# Patient Record
Sex: Female | Born: 1955 | Race: White | Hispanic: No | Smoking: Former smoker
Health system: Southern US, Community
[De-identification: ages and names within clinical notes are randomized; demographics above are authoritative.]

## PROBLEM LIST (undated history)

## (undated) DIAGNOSIS — J449 Chronic obstructive pulmonary disease, unspecified: Secondary | ICD-10-CM

## (undated) DIAGNOSIS — F419 Anxiety disorder, unspecified: Secondary | ICD-10-CM

## (undated) DIAGNOSIS — J189 Pneumonia, unspecified organism: Secondary | ICD-10-CM

## (undated) DIAGNOSIS — R600 Localized edema: Secondary | ICD-10-CM

## (undated) DIAGNOSIS — Z9889 Other specified postprocedural states: Secondary | ICD-10-CM

## (undated) DIAGNOSIS — M069 Rheumatoid arthritis, unspecified: Secondary | ICD-10-CM

## (undated) DIAGNOSIS — K219 Gastro-esophageal reflux disease without esophagitis: Secondary | ICD-10-CM

## (undated) DIAGNOSIS — I447 Left bundle-branch block, unspecified: Secondary | ICD-10-CM

## (undated) DIAGNOSIS — I509 Heart failure, unspecified: Secondary | ICD-10-CM

## (undated) DIAGNOSIS — B029 Zoster without complications: Secondary | ICD-10-CM

## (undated) DIAGNOSIS — I10 Essential (primary) hypertension: Secondary | ICD-10-CM

## (undated) DIAGNOSIS — K317 Polyp of stomach and duodenum: Secondary | ICD-10-CM

## (undated) DIAGNOSIS — E785 Hyperlipidemia, unspecified: Secondary | ICD-10-CM

## (undated) DIAGNOSIS — K589 Irritable bowel syndrome without diarrhea: Secondary | ICD-10-CM

## (undated) DIAGNOSIS — R112 Nausea with vomiting, unspecified: Secondary | ICD-10-CM

## (undated) HISTORY — DX: Left bundle-branch block, unspecified: I44.7

## (undated) HISTORY — PX: VAGINAL HYSTERECTOMY: SUR661

## (undated) HISTORY — DX: Essential (primary) hypertension: I10

## (undated) HISTORY — PX: DILATION AND CURETTAGE OF UTERUS: SHX78

## (undated) HISTORY — PX: KNEE ARTHROSCOPY: SUR90

## (undated) HISTORY — PX: TUBAL LIGATION: SHX77

## (undated) HISTORY — DX: Polyp of stomach and duodenum: K31.7

## (undated) HISTORY — DX: Zoster without complications: B02.9

## (undated) HISTORY — DX: Chronic obstructive pulmonary disease, unspecified: J44.9

## (undated) HISTORY — DX: Heart failure, unspecified: I50.9

## (undated) HISTORY — DX: Gastro-esophageal reflux disease without esophagitis: K21.9

## (undated) HISTORY — PX: CARDIAC CATHETERIZATION: SHX172

## (undated) HISTORY — PX: ESOPHAGOGASTRODUODENOSCOPY: SHX1529

## (undated) HISTORY — PX: CHOLECYSTECTOMY: SHX55

## (undated) HISTORY — DX: Anxiety disorder, unspecified: F41.9

## (undated) HISTORY — DX: Hyperlipidemia, unspecified: E78.5

---

## 2003-08-25 ENCOUNTER — Encounter: Admission: RE | Admit: 2003-08-25 | Discharge: 2003-08-25 | Payer: Self-pay | Admitting: Family Medicine

## 2003-09-29 ENCOUNTER — Encounter: Admission: RE | Admit: 2003-09-29 | Discharge: 2003-09-29 | Payer: Self-pay | Admitting: Family Medicine

## 2004-01-10 ENCOUNTER — Other Ambulatory Visit: Admission: RE | Admit: 2004-01-10 | Discharge: 2004-01-10 | Payer: Self-pay | Admitting: Family Medicine

## 2004-01-19 ENCOUNTER — Encounter: Admission: RE | Admit: 2004-01-19 | Discharge: 2004-01-19 | Payer: Self-pay | Admitting: Family Medicine

## 2004-05-01 ENCOUNTER — Ambulatory Visit: Payer: Self-pay | Admitting: Family Medicine

## 2004-07-10 ENCOUNTER — Ambulatory Visit: Payer: Self-pay | Admitting: Family Medicine

## 2004-07-25 ENCOUNTER — Ambulatory Visit: Payer: Self-pay | Admitting: Family Medicine

## 2004-10-25 ENCOUNTER — Ambulatory Visit: Payer: Self-pay | Admitting: Family Medicine

## 2004-10-27 ENCOUNTER — Ambulatory Visit: Payer: Self-pay | Admitting: Family Medicine

## 2004-11-06 ENCOUNTER — Ambulatory Visit: Payer: Self-pay | Admitting: Family Medicine

## 2004-11-09 ENCOUNTER — Ambulatory Visit: Payer: Self-pay | Admitting: Family Medicine

## 2005-04-19 ENCOUNTER — Ambulatory Visit: Payer: Self-pay | Admitting: Family Medicine

## 2005-05-09 ENCOUNTER — Ambulatory Visit: Payer: Self-pay | Admitting: Family Medicine

## 2005-05-10 ENCOUNTER — Ambulatory Visit: Payer: Self-pay | Admitting: Family Medicine

## 2005-05-18 ENCOUNTER — Ambulatory Visit: Payer: Self-pay | Admitting: Internal Medicine

## 2005-06-05 ENCOUNTER — Ambulatory Visit: Payer: Self-pay | Admitting: Gastroenterology

## 2005-06-06 ENCOUNTER — Ambulatory Visit: Payer: Self-pay | Admitting: Gastroenterology

## 2005-06-06 HISTORY — PX: COLONOSCOPY: SHX174

## 2005-07-17 ENCOUNTER — Ambulatory Visit: Payer: Self-pay | Admitting: Gastroenterology

## 2005-07-31 ENCOUNTER — Ambulatory Visit: Payer: Self-pay | Admitting: Family Medicine

## 2005-08-20 ENCOUNTER — Ambulatory Visit (HOSPITAL_BASED_OUTPATIENT_CLINIC_OR_DEPARTMENT_OTHER): Admission: RE | Admit: 2005-08-20 | Discharge: 2005-08-20 | Payer: Self-pay | Admitting: Family Medicine

## 2005-08-25 ENCOUNTER — Ambulatory Visit: Payer: Self-pay | Admitting: Pulmonary Disease

## 2005-08-31 ENCOUNTER — Ambulatory Visit: Payer: Self-pay | Admitting: Critical Care Medicine

## 2005-10-09 ENCOUNTER — Ambulatory Visit: Payer: Self-pay | Admitting: Pulmonary Disease

## 2005-10-11 ENCOUNTER — Ambulatory Visit: Payer: Self-pay | Admitting: Family Medicine

## 2005-10-26 ENCOUNTER — Ambulatory Visit: Payer: Self-pay

## 2005-11-20 ENCOUNTER — Ambulatory Visit: Payer: Self-pay

## 2005-11-20 ENCOUNTER — Encounter: Payer: Self-pay | Admitting: Cardiology

## 2005-11-23 ENCOUNTER — Ambulatory Visit: Payer: Self-pay | Admitting: Cardiology

## 2006-01-03 ENCOUNTER — Ambulatory Visit: Payer: Self-pay | Admitting: Cardiology

## 2006-02-22 ENCOUNTER — Ambulatory Visit: Payer: Self-pay | Admitting: Family Medicine

## 2006-03-11 ENCOUNTER — Ambulatory Visit: Payer: Self-pay | Admitting: Family Medicine

## 2006-05-08 ENCOUNTER — Ambulatory Visit: Payer: Self-pay | Admitting: Family Medicine

## 2006-08-26 ENCOUNTER — Ambulatory Visit: Payer: Self-pay | Admitting: Family Medicine

## 2006-08-27 ENCOUNTER — Ambulatory Visit: Payer: Self-pay | Admitting: Family Medicine

## 2006-10-08 DIAGNOSIS — K219 Gastro-esophageal reflux disease without esophagitis: Secondary | ICD-10-CM | POA: Insufficient documentation

## 2006-10-08 DIAGNOSIS — E785 Hyperlipidemia, unspecified: Secondary | ICD-10-CM | POA: Insufficient documentation

## 2006-10-08 DIAGNOSIS — J449 Chronic obstructive pulmonary disease, unspecified: Secondary | ICD-10-CM | POA: Insufficient documentation

## 2006-10-08 DIAGNOSIS — E1151 Type 2 diabetes mellitus with diabetic peripheral angiopathy without gangrene: Secondary | ICD-10-CM | POA: Insufficient documentation

## 2006-10-08 DIAGNOSIS — Z9889 Other specified postprocedural states: Secondary | ICD-10-CM | POA: Insufficient documentation

## 2006-10-08 DIAGNOSIS — I10 Essential (primary) hypertension: Secondary | ICD-10-CM | POA: Insufficient documentation

## 2006-10-08 DIAGNOSIS — E119 Type 2 diabetes mellitus without complications: Secondary | ICD-10-CM | POA: Insufficient documentation

## 2006-10-08 DIAGNOSIS — E1165 Type 2 diabetes mellitus with hyperglycemia: Secondary | ICD-10-CM

## 2006-12-02 ENCOUNTER — Encounter: Payer: Self-pay | Admitting: Family Medicine

## 2006-12-19 ENCOUNTER — Ambulatory Visit: Payer: Self-pay | Admitting: Family Medicine

## 2006-12-19 DIAGNOSIS — J019 Acute sinusitis, unspecified: Secondary | ICD-10-CM | POA: Insufficient documentation

## 2006-12-26 ENCOUNTER — Encounter: Payer: Self-pay | Admitting: Family Medicine

## 2007-01-14 ENCOUNTER — Ambulatory Visit: Payer: Self-pay | Admitting: Family Medicine

## 2007-01-15 ENCOUNTER — Encounter: Payer: Self-pay | Admitting: Family Medicine

## 2007-06-10 ENCOUNTER — Telehealth (INDEPENDENT_AMBULATORY_CARE_PROVIDER_SITE_OTHER): Payer: Self-pay | Admitting: *Deleted

## 2007-12-04 ENCOUNTER — Ambulatory Visit: Payer: Self-pay | Admitting: Family Medicine

## 2007-12-04 ENCOUNTER — Telehealth: Payer: Self-pay | Admitting: Family Medicine

## 2007-12-04 DIAGNOSIS — F419 Anxiety disorder, unspecified: Secondary | ICD-10-CM | POA: Insufficient documentation

## 2007-12-31 ENCOUNTER — Ambulatory Visit: Payer: Self-pay | Admitting: Family Medicine

## 2007-12-31 LAB — CONVERTED CEMR LAB
Nitrite: NEGATIVE
Protein, U semiquant: NEGATIVE

## 2008-01-01 ENCOUNTER — Telehealth (INDEPENDENT_AMBULATORY_CARE_PROVIDER_SITE_OTHER): Payer: Self-pay | Admitting: *Deleted

## 2008-01-05 ENCOUNTER — Telehealth (INDEPENDENT_AMBULATORY_CARE_PROVIDER_SITE_OTHER): Payer: Self-pay | Admitting: *Deleted

## 2008-01-07 ENCOUNTER — Telehealth (INDEPENDENT_AMBULATORY_CARE_PROVIDER_SITE_OTHER): Payer: Self-pay | Admitting: *Deleted

## 2008-01-11 LAB — CONVERTED CEMR LAB
Albumin: 3.7 g/dL (ref 3.5–5.2)
Alkaline Phosphatase: 82 units/L (ref 39–117)
Bilirubin, Direct: 0.1 mg/dL (ref 0.0–0.3)
GFR calc Af Amer: 84 mL/min
GFR calc non Af Amer: 69 mL/min
HDL: 33.1 mg/dL — ABNORMAL LOW (ref >39.0)
Hgb A1c MFr Bld: 9.3 % — ABNORMAL HIGH (ref 4.6–6.0)
Microalb Creat Ratio: 20 mg/g (ref 0.0–30.0)
Potassium: 3.9 meq/L (ref 3.5–5.1)
Sodium: 140 meq/L (ref 135–145)
Total Bilirubin: 0.6 mg/dL (ref 0.3–1.2)
Total CHOL/HDL Ratio: 5.5
VLDL: 49 mg/dL — ABNORMAL HIGH (ref 0–40)

## 2008-01-12 ENCOUNTER — Telehealth (INDEPENDENT_AMBULATORY_CARE_PROVIDER_SITE_OTHER): Payer: Self-pay | Admitting: *Deleted

## 2008-03-12 ENCOUNTER — Encounter: Payer: Self-pay | Admitting: Family Medicine

## 2008-03-31 ENCOUNTER — Encounter: Payer: Self-pay | Admitting: Family Medicine

## 2008-04-12 ENCOUNTER — Encounter: Payer: Self-pay | Admitting: Family Medicine

## 2008-05-07 ENCOUNTER — Telehealth (INDEPENDENT_AMBULATORY_CARE_PROVIDER_SITE_OTHER): Payer: Self-pay | Admitting: *Deleted

## 2008-05-07 ENCOUNTER — Ambulatory Visit: Payer: Self-pay | Admitting: Family Medicine

## 2008-05-07 DIAGNOSIS — J40 Bronchitis, not specified as acute or chronic: Secondary | ICD-10-CM | POA: Insufficient documentation

## 2008-05-07 DIAGNOSIS — J209 Acute bronchitis, unspecified: Secondary | ICD-10-CM | POA: Insufficient documentation

## 2008-05-13 ENCOUNTER — Encounter: Payer: Self-pay | Admitting: Family Medicine

## 2008-05-19 ENCOUNTER — Ambulatory Visit: Payer: Self-pay | Admitting: Family Medicine

## 2008-05-24 ENCOUNTER — Telehealth: Payer: Self-pay | Admitting: Family Medicine

## 2008-09-14 ENCOUNTER — Encounter: Payer: Self-pay | Admitting: Family Medicine

## 2008-11-16 ENCOUNTER — Encounter: Payer: Self-pay | Admitting: Family Medicine

## 2008-12-01 ENCOUNTER — Telehealth (INDEPENDENT_AMBULATORY_CARE_PROVIDER_SITE_OTHER): Payer: Self-pay | Admitting: *Deleted

## 2008-12-17 ENCOUNTER — Ambulatory Visit: Payer: Self-pay | Admitting: Family Medicine

## 2008-12-17 DIAGNOSIS — R209 Unspecified disturbances of skin sensation: Secondary | ICD-10-CM | POA: Insufficient documentation

## 2008-12-17 DIAGNOSIS — L0201 Cutaneous abscess of face: Secondary | ICD-10-CM | POA: Insufficient documentation

## 2008-12-17 DIAGNOSIS — L03211 Cellulitis of face: Secondary | ICD-10-CM

## 2009-01-22 ENCOUNTER — Encounter: Payer: Self-pay | Admitting: Family Medicine

## 2009-01-24 ENCOUNTER — Telehealth: Payer: Self-pay | Admitting: Family Medicine

## 2009-03-09 ENCOUNTER — Telehealth (INDEPENDENT_AMBULATORY_CARE_PROVIDER_SITE_OTHER): Payer: Self-pay | Admitting: *Deleted

## 2009-03-16 ENCOUNTER — Encounter: Payer: Self-pay | Admitting: Family Medicine

## 2009-03-17 ENCOUNTER — Ambulatory Visit: Payer: Self-pay | Admitting: Family Medicine

## 2009-03-17 DIAGNOSIS — M1990 Unspecified osteoarthritis, unspecified site: Secondary | ICD-10-CM | POA: Insufficient documentation

## 2009-03-17 LAB — CONVERTED CEMR LAB
Bilirubin Urine: NEGATIVE
Glucose, Urine, Semiquant: NEGATIVE
Urobilinogen, UA: NEGATIVE

## 2009-03-21 ENCOUNTER — Telehealth: Payer: Self-pay | Admitting: Family Medicine

## 2009-03-21 LAB — CONVERTED CEMR LAB
Albumin: 4 g/dL (ref 3.5–5.2)
Basophils Relative: 0.9 % (ref 0.0–3.0)
CO2: 31 meq/L (ref 19–32)
Chloride: 104 meq/L (ref 96–112)
Cholesterol: 189 mg/dL (ref 0–200)
Creatinine, Ser: 0.9 mg/dL (ref 0.4–1.2)
Eosinophils Absolute: 0.1 10*3/uL (ref 0.0–0.7)
HCT: 38.4 % (ref 36.0–46.0)
Hemoglobin: 13.2 g/dL (ref 12.0–15.0)
Hgb A1c MFr Bld: 7.1 % — ABNORMAL HIGH (ref 4.6–6.5)
Lymphs Abs: 2.1 10*3/uL (ref 0.7–4.0)
MCHC: 34.4 g/dL (ref 30.0–36.0)
MCV: 86.4 fL (ref 78.0–100.0)
Microalb, Ur: 0.8 mg/dL (ref 0.0–1.9)
Monocytes Absolute: 0.5 10*3/uL (ref 0.1–1.0)
Neutro Abs: 5.5 10*3/uL (ref 1.4–7.7)
Neutrophils Relative %: 66 % (ref 43.0–77.0)
RBC: 4.45 M/uL (ref 3.87–5.11)
TSH: 1.66 microintl units/mL (ref 0.35–5.50)
Total CHOL/HDL Ratio: 6
Total Protein: 7.4 g/dL (ref 6.0–8.3)

## 2009-05-22 ENCOUNTER — Telehealth: Payer: Self-pay | Admitting: Family Medicine

## 2009-05-25 ENCOUNTER — Telehealth (INDEPENDENT_AMBULATORY_CARE_PROVIDER_SITE_OTHER): Payer: Self-pay | Admitting: *Deleted

## 2009-08-22 ENCOUNTER — Ambulatory Visit: Payer: Self-pay | Admitting: Family Medicine

## 2009-08-22 DIAGNOSIS — R109 Unspecified abdominal pain: Secondary | ICD-10-CM | POA: Insufficient documentation

## 2009-08-22 LAB — CONVERTED CEMR LAB
Blood in Urine, dipstick: NEGATIVE
Nitrite: NEGATIVE
WBC Urine, dipstick: NEGATIVE
pH: 5

## 2009-08-23 ENCOUNTER — Telehealth (INDEPENDENT_AMBULATORY_CARE_PROVIDER_SITE_OTHER): Payer: Self-pay | Admitting: *Deleted

## 2009-08-23 LAB — CONVERTED CEMR LAB
AST: 29 units/L (ref 0–37)
Albumin: 4.2 g/dL (ref 3.5–5.2)
Alkaline Phosphatase: 79 units/L (ref 39–117)
Basophils Absolute: 0.1 10*3/uL (ref 0.0–0.1)
Bilirubin, Direct: 0 mg/dL (ref 0.0–0.3)
Calcium: 9.2 mg/dL (ref 8.4–10.5)
Creatinine, Ser: 0.9 mg/dL (ref 0.4–1.2)
Creatinine,U: 147.5 mg/dL
Eosinophils Absolute: 0.1 10*3/uL (ref 0.0–0.7)
GFR calc non Af Amer: 68.82 mL/min (ref >60)
Glucose, Bld: 83 mg/dL (ref 70–99)
HDL: 45.7 mg/dL (ref >39.00)
Hemoglobin: 13.1 g/dL (ref 12.0–15.0)
Hgb A1c MFr Bld: 6.1 % (ref 4.6–6.5)
Lipase: 20 units/L (ref 11.0–59.0)
Lymphocytes Relative: 24.7 % (ref 12.0–46.0)
Microalb Creat Ratio: 8.1 mg/g (ref 0.0–30.0)
Microalb, Ur: 1.2 mg/dL (ref 0.0–1.9)
Monocytes Relative: 6.2 % (ref 3.0–12.0)
Neutro Abs: 5.8 10*3/uL (ref 1.4–7.7)
Neutrophils Relative %: 66.9 % (ref 43.0–77.0)
RDW: 11.8 % (ref 11.5–14.6)
Sodium: 139 meq/L (ref 135–145)
Total Bilirubin: 0.4 mg/dL (ref 0.3–1.2)
Total CHOL/HDL Ratio: 4
Triglycerides: 165 mg/dL — ABNORMAL HIGH (ref 0.0–149.0)
VLDL: 33 mg/dL (ref 0.0–40.0)

## 2009-08-25 ENCOUNTER — Encounter: Payer: Self-pay | Admitting: Family Medicine

## 2009-08-26 ENCOUNTER — Encounter (INDEPENDENT_AMBULATORY_CARE_PROVIDER_SITE_OTHER): Payer: Self-pay | Admitting: *Deleted

## 2009-08-26 ENCOUNTER — Telehealth: Payer: Self-pay | Admitting: Family Medicine

## 2009-09-08 ENCOUNTER — Telehealth: Payer: Self-pay | Admitting: Family Medicine

## 2009-09-23 ENCOUNTER — Telehealth: Payer: Self-pay | Admitting: Family Medicine

## 2009-09-26 ENCOUNTER — Encounter: Payer: Self-pay | Admitting: Family Medicine

## 2009-12-15 ENCOUNTER — Telehealth: Payer: Self-pay | Admitting: Family Medicine

## 2010-01-02 ENCOUNTER — Encounter: Payer: Self-pay | Admitting: Family Medicine

## 2010-02-08 ENCOUNTER — Telehealth (INDEPENDENT_AMBULATORY_CARE_PROVIDER_SITE_OTHER): Payer: Self-pay | Admitting: *Deleted

## 2010-04-10 ENCOUNTER — Encounter: Payer: Self-pay | Admitting: Family Medicine

## 2010-05-22 ENCOUNTER — Encounter: Payer: Self-pay | Admitting: Family Medicine

## 2010-06-09 ENCOUNTER — Encounter: Payer: Self-pay | Admitting: Family Medicine

## 2010-06-20 ENCOUNTER — Encounter: Payer: Self-pay | Admitting: Family Medicine

## 2010-06-30 ENCOUNTER — Ambulatory Visit: Admit: 2010-06-30 | Payer: Self-pay | Admitting: Internal Medicine

## 2010-07-25 NOTE — Progress Notes (Signed)
Summary: REFILL REQUEST  Phone Note Refill Request Call back at [phone] Message from:  Pharmacy on December 15, 2009 8:21 AM  Refills Requested: Medication #1:  ALPRAZOLAM 0.5 MG  TB24 as needed   Dosage confirmed as above?Dosage Confirmed   Supply Requested: 3 months   Last Refilled: 08/26/2009 [facility] [address] [place], [place] [address]  Next Appointment Scheduled: NONE Initial call taken by: Lavell Islam,  December 15, 2009 8:21 AM  Follow-up for Phone Call        last ov- 08/22/09. Army Fossa CMA  December 15, 2009 8:34 AM   Additional Follow-up for Phone Call Additional follow up Details #1::        refill x1 Additional Follow-up by: Loreen Freud DO,  December 15, 2009 10:17 AM    Prescriptions: ALPRAZOLAM 0.5 MG  TB24 (ALPRAZOLAM) as needed  #90 x 0   Entered by:   Army Fossa CMA   Authorized by:   Loreen Freud DO   Signed by:   Army Fossa CMA on 12/15/2009   Method used:   Printed then faxed to ...       [facility]  [address] [phone]* (retail)       [address]       [place], [place]  [address]       Ph: [phone] or [phone]       Fax: [phone]   RxID:   [id number]

## 2010-07-25 NOTE — Medication Information (Signed)
Summary: Fax Regarding Adding Lipid Lowering Agent/[facility]  Fax Regarding Cardiovascular Risk in Diabetes/[facility]   Imported By: Lanelle Bal 04/24/2010 09:12:58  _____________________________________________________________________  External Attachment:    Type:   Image     Comment:   External Document

## 2010-07-25 NOTE — Letter (Signed)
Summary: New Patient letter  [facility]  [address] [place], [place] [address]   Phone: [phone]  Fax: [phone]       08/26/2009 MRN: [id number]  Beth Shepard [address] [place], [place]  [address]  Dear [name],  Welcome to the Gastroenterology Division at [facility].    You are scheduled to see Dr.  Jarold Motto on  09-23-09 at  [facility] on the 3rd floor at [facility], [address]  We ask that you try to arrive at our office 15 minutes prior to your appointment time to allow for check-in.  We would like you to complete the enclosed self-administered evaluation form prior to your visit and bring it with you on the day of your appointment.  We will review it with you.  Also, please bring a complete list of all your medications or, if you prefer, bring the medication bottles and we will list them.  Please bring your insurance card so that we may make a copy of it.  If your insurance requires a referral to see a specialist, please bring your referral form from your primary care physician.  Co-payments are due at the time of your visit and may be paid by cash, check or credit card.     Your office visit will consist of a consult with your physician (includes a physical exam), any laboratory testing he/she may order, scheduling of any necessary diagnostic testing (e.g. x-ray, ultrasound, CT-scan), and scheduling of a procedure (e.g. Endoscopy, Colonoscopy) if required.  Please allow enough time on your schedule to allow for any/all of these possibilities.    If you cannot keep your appointment, please call [phone] to cancel or reschedule prior to your appointment date.  This allows [place] the opportunity to schedule an appointment for another patient in need of care.  If you do not cancel or reschedule by 5 p.m. the business day prior to your appointment date, you will be charged a $50.00 late cancellation/no-show fee.    Thank you for choosing  [facility] for your medical needs.  We appreciate the opportunity to care for you.  Please visit [place] at our website  to learn more about our practice.                     Sincerely,                                                             The Gastroenterology Division

## 2010-07-25 NOTE — Medication Information (Signed)
Summary: Care Consideration Regarding Statin/[employer] Health Mgmt  Care Consideration Regarding Statin/Active Health Mgmt   Imported By: Lanelle Bal 10/12/2009 09:31:16  _____________________________________________________________________  External Attachment:    Type:   Image     Comment:   External Document

## 2010-07-25 NOTE — Progress Notes (Signed)
Summary: REfills  Phone Note Refill Request Call back at Home Phone [phone]   Refills Requested: Medication #1:  ULTRAM 50 MG TABS 1-2 by mouth every 6 hours as needed   Last Refilled: 03/17/2009  Medication #2:  DEXILANT 60 MG CPDR 1 by mouth once daily last ov- 08/22/09 cpx. Army Fossa CMA  September 23, 2009 1:23 PM    Follow-up for Phone Call        ok to refill both x1 11 refills on dexilant Follow-up by: Loreen Freud DO,  September 23, 2009 1:46 PM  Additional Follow-up for Phone Call Additional follow up Details #1::        Pt is aware, meds sent in. Army Fossa CMA  September 23, 2009 1:50 PM     Prescriptions: ULTRAM 50 MG TABS (TRAMADOL HCL) 1-2 by mouth every 6 hours as needed  #180 x 0   Entered by:   Army Fossa CMA   Authorized by:   Loreen Freud DO   Signed by:   Army Fossa CMA on 09/23/2009   Method used:   [facility]        [facility]  [address] [phone]* (retail)       [address]       [place], [place]  [address]       Ph: [phone] or [phone]       Fax: [phone]   RxID:   [id number] DEXILANT 60 MG CPDR (DEXLANSOPRAZOLE) 1 by mouth once daily  #30 x 11   Entered by:   Army Fossa CMA   Authorized by:   Loreen Freud DO   Signed by:   Army Fossa CMA on 09/23/2009   Method used:   [facility]ically [facility]        [facility]  [address] [phone]* (retail)       [address]       [place], [place]  [address]       Ph: [phone] or [phone]       Fax: [phone]   RxID:   [id number]

## 2010-07-25 NOTE — Progress Notes (Signed)
Summary: ? yeast infection  Phone Note Call from Patient   Caller: Patient Summary of Call: Pt states that over the past 2 days she has had a dischage and having some itching. When she wipes after urinating she noticies tenderness. No burning when urinating. She states she has not tried anything OTC to try and clear this up. She states it usually takes Dr.Lowne prescribing something for it clear up. [facility] [place] Please advise. Army Fossa CMA  September 08, 2009 1:56 PM   Follow-up for Phone Call        diflucan 150 #2  1 by mouth once daily x1--may repeat 3 days as needed  Follow-up by: Loreen Freud DO,  September 08, 2009 2:12 PM  Additional Follow-up for Phone Call Additional follow up Details #1::        Pt is aware. Army Fossa CMA  September 08, 2009 2:26 PM     New/Updated Medications: DIFLUCAN 150 MG TABS (FLUCONAZOLE) 1 by mouth once daily x1--may repeat 3 days as needed Prescriptions: DIFLUCAN 150 MG TABS (FLUCONAZOLE) 1 by mouth once daily x1--may repeat 3 days as needed  #2 x 0   Entered by:   Army Fossa CMA   Authorized by:   Loreen Freud DO   Signed by:   Army Fossa CMA on 09/08/2009   Method used:   [facility]        [facility]  [address] [phone]* (retail)       [address]       [place], [place]  [address]       Ph: [phone] or [phone]       Fax: [phone]   RxID:   [id number]

## 2010-07-25 NOTE — Progress Notes (Signed)
Summary: ultrasound?  Phone Note Call from Patient   Caller: Patient Summary of Call: patient says she thought that she was going to have ultrasound done before we referred to her go GI just wanted to be sure although she is ok w/ going to see Dr. Jarold Motto aware Dr. Laury Axon out of office today and will address on mon...Marland KitchenMarland KitchenDoristine Devoid  August 26, 2009 10:23 AM   Follow-up for Phone Call        I thought about it but decided to let GI see her first. Follow-up by: Loreen Freud DO,  August 28, 2009 8:44 AM  Additional Follow-up for Phone Call Additional follow up Details #1::        Pt is aware. Army Fossa CMA  August 29, 2009 10:32 AM

## 2010-07-25 NOTE — Miscellaneous (Signed)
Summary: Orders Update   Clinical Lists Changes  Orders: Added new Referral order of Gastroenterology Referral (GI) - Signed 

## 2010-07-25 NOTE — Letter (Signed)
Summary: Care Consideration Regarding Eye Exam/[facility]  Care Consideration Regarding Eye Exam/[facility]   Imported By: Lanelle Bal 02/07/2010 07:58:16  _____________________________________________________________________  External Attachment:    Type:   Image     Comment:   External Document  Appended Document: Care Consideration Regarding Eye Exam/[facility] let pt know we received notification from ins co that she needed an eye exam  Appended Document: Care Consideration Regarding Eye Exam/[facility] SEE PHONE NOTE

## 2010-07-25 NOTE — Procedures (Signed)
Summary: EGD   EGD  Procedure date:  06/06/2005  Findings:      Location: [facility]    EGD  Procedure date:  06/06/2005  Findings:      Location: [facility]   Patient Name: Beth Shepard, Beth Shepard MRN:  Procedure Procedures: Panendoscopy (EGD) CPT: 43235.    with biopsy(s)/brushing(s). CPT: D1846139.  Personnel: Endoscopist: Vania Rea. Jarold Motto, MD.  Exam Location: Exam performed in Outpatient Clinic. Outpatient  Patient Consent: Procedure, Alternatives, Risks and Benefits discussed, consent obtained, from patient. Consent was obtained by the RN.  Indications Symptoms: Nausea. Vomiting. Abdominal pain, location: diffuse.  History  Current Medications: Patient is not currently taking Coumadin.  Pre-Exam Physical: Performed Jun 06, 2005  Cardio-pulmonary exam, Abdominal exam, Extremity exam, Mental status exam WNL.  Comments: Patient history reviewed and updated, pre-procedure physical exam performed prior to initiation of sedation? Exam Exam Info: Maximum depth of insertion Duodenum, intended Duodenum. Patient position: on left side. Duration of exam: 15 minutes. Vocal cords visualized. Gastric retroflexion performed. Images taken. ASA Classification: II. Tolerance: excellent.  Sedation Meds: Patient assessed and found to be appropriate for moderate (conscious) sedation. Cetacaine Spray 2 sprays given aerosolized.  Monitoring: BP and pulse monitoring done. Oximetry used. Supplemental O2 given at 2 Liters.  Instrument(s): GIF 160. Serial [id number].   Findings - Normal: Proximal Esophagus to Distal Esophagus. Not Seen: Tumor. Barrett's esophagus. Esophageal inflammation. Mucosal abnormality. Stricture. Varices.  - Normal: Pyloric Sphincter to Duodenal 2nd Portion. Ulcer. Mucosal abnormality. AVM's. Foreign body.  - MUCOSAL ABNORMALITY: Body to Antrum. Nodularity present. Red spots present. Granular mucosa. RUT done, results pending.  ICD9: Gastritis, Unspecified: 535.50.   Assessment  Diagnoses: 535.50: Gastritis, Unspecified.   Comments: Clinically with chronic GERD.... Events  Unplanned Intervention: No unplanned interventions were required.  Plans Medication(s): Await pathology. Continue current medications.  Disposition: After procedure patient sent to recovery. After recovery patient sent home.  Scheduling: Follow-up prn.    This report was created from the original endoscopy report, which was reviewed and signed by the above listed endoscopist.

## 2010-07-25 NOTE — Progress Notes (Signed)
Summary: Lab results   Phone Note Outgoing Call   Call placed by: Army Fossa CMA,  August 23, 2009 11:19 AM Summary of Call: Regarding lab results, LMTCB:  Ideally your LDL (bad cholesterol) should be <_70___, your HDL (good cholesterol) should be >_40__ and your triglycerides should be< 150.  Diet and exercise will increase HDL and decrease the LDL and triglycerides. Read Dr. Danice Goltz book--Eat Drink and Be Healthy.   Con't meds.  Add Antara to decrease Triglycerides.  We will recheck labs in _3__ months.   272.4  250.00  hgba1c, bmp,  hep, lipid DM controlled Signed by Loreen Freud DO on 08/23/2009 at 6:47 AM  Follow-up for Phone Call        Pt is aware. Army Fossa CMA  August 24, 2009 1:11 PM     New/Updated Medications: ANTARA 130 MG CAPS (FENOFIBRATE MICRONIZED) 1 by mouth daily. Prescriptions: ANTARA 130 MG CAPS (FENOFIBRATE MICRONIZED) 1 by mouth daily.  #30 x 2   Entered by:   Army Fossa CMA   Authorized by:   Loreen Freud DO   Signed by:   Army Fossa CMA on 08/24/2009   Method used:   [facility]        [facility]  [address] [phone][facility] (retail)       [address]       [place], [place]  [address]       Ph: [phone] or [phone]       Fax: [phone]   RxID:   [id number]

## 2010-07-25 NOTE — Assessment & Plan Note (Signed)
Summary: CPX,FASTING,MED REFILL & STOMACH//PH   Vital Signs:  Patient profile:   55 year old female Height:      64 inches Weight:      190 pounds BMI:     32.73 Temp:     98.2 degrees F oral BP sitting:   130 / 84  (left arm) Cuff size:   large  Vitals Entered By: Army Fossa CMA (August 22, 2009 10:08 AM) CC: Pt c/o numbness in her right hand x 1 month. Pt is having stomach pains off and on, having constipation. , Abdominal Pain   History of Present Illness: Pt here c/o numbness in R hand and arm and pt describes it as a burning pain.         This is a 55 year old woman who presents with Abdominal Pain.  The symptoms began 2 months ago.  It started with fever and abd pain and now pain only.  The patient reports constipation, but denies nausea, vomiting, diarrhea, melena, hematochezia, anorexia, and hematemesis.  The location of the pain is left upper quadrant.  The pain is described as intermittent, sharp, and cramping in quality.  The patient denies the following symptoms: fever, weight loss, dysuria, chest pain, jaundice, dark urine, missed menstrual period, and vaginal bleeding.  The pain is worse with food.  The pain is better with defecation.    Current Medications (verified): 1)  Dexilant 60 Mg Cpdr (Dexlansoprazole) .Marland Kitchen.. 1 By Mouth Once Daily 2)  Lisinopril 20 Mg  Tabs (Lisinopril) .Marland Kitchen.. 1 By Mouth Once Daily -----  Cancel Lisinopril 10 3)  Librax 2.5-5 Mg Caps (Clidinium-Chlordiazepoxide) .Marland Kitchen.. 1 By Mouth Once Daily As Needed 4)  Amaryl 4 Mg Tabs (Glimepiride) .... Take 1 Tablet By Mouth Twice A Day 5)  Alprazolam 0.5 Mg  Tb24 (Alprazolam) .... As Needed 6)  Zocor 40 Mg  Tabs (Simvastatin) .... Take One Tablet By Mouth At Bedtime. 7)  Januvia 100 Mg  Tabs (Sitagliptin Phosphate) .... Take One Tablet Daily 8)  Ultram 50 Mg Tabs (Tramadol Hcl) .Marland Kitchen.. 1-2 By Mouth Every 6 Hours As Needed 9)  One Touch Ultra Strips .... Accu Check Two Times A Day  Allergies: 1)  *  Codeine 2)  * Ibuprofen  Past History:  Past medical, surgical, family and social histories (including risk factors) reviewed for relevance to current acute and chronic problems.  Past Medical History: Reviewed history from 12/17/2008 and no changes required. COPD Diabetes mellitus, type II GERD Hyperlipidemia Hypertension Anxiety Shingles  Past Surgical History: Reviewed history from 10/08/2006 and no changes required. Hysterectomy Cholecystectomy  Family History: Reviewed history and no changes required.  Social History: Reviewed history and no changes required.  Review of Systems      See HPI  Physical Exam  General:  Well-developed,well-nourished,in no acute distress; alert,appropriate and cooperative throughout examination Neck:  No deformities, masses, or tenderness noted. Lungs:  Normal respiratory effort, chest expands symmetrically. Lungs are clear to auscultation, no crackles or wheezes. Heart:  normal rate and no murmur.   Abdomen:  soft, epigastric tenderness, and LUQ tenderness.   Extremities:  + numbness in fingers with flexing wrists Neurologic:  alert & oriented X3 and strength normal in all extremities.     Impression & Recommendations:  Problem # 1:  PARESTHESIA, HANDS (ICD-782.0)  R > L  con't splints  Orders: Orthopedic Surgeon Referral (Ortho Surgeon)  Problem # 2:  ABDOMINAL PAIN OTHER SPECIFIED SITE (ICD-789.09) Pt needs to see GI-- [place]  is more convenient for her-- she use to have a GI there and will f/u with them dexilant 60 mg once daily  The following medications were removed from the medication list:    Vicodin 5-500 Mg Tabs (Hydrocodone-acetaminophen) .Marland Kitchen... 1-2 tabs by mouth q6 as needed for pain Her updated medication list for this problem includes:    Ultram 50 Mg Tabs (Tramadol hcl) .Marland Kitchen... 1-2 by mouth every 6 hours as needed  Orders: TLB-Amylase (82150-AMYL) TLB-A1C / Hgb A1C (Glycohemoglobin) (83036-A1C) TLB-Lipase  (83690-LIPASE) TLB-Microalbumin/Creat Ratio, Urine (82043-MALB) UA Dipstick w/o Micro (manual) (10272)  Discussed use of medications, application of heat or cold, and exercises.   Complete Medication List: 1)  Dexilant 60 Mg Cpdr (Dexlansoprazole) .Marland Kitchen.. 1 by mouth once daily 2)  Lisinopril 20 Mg Tabs (Lisinopril) .Marland Kitchen.. 1 by mouth once daily -----  cancel lisinopril 10 3)  Librax 2.5-5 Mg Caps (Clidinium-chlordiazepoxide) .Marland Kitchen.. 1 by mouth once daily as needed 4)  Amaryl 4 Mg Tabs (Glimepiride) .... Take 1 tablet by mouth twice a day 5)  Alprazolam 0.5 Mg Tb24 (Alprazolam) .... As needed 6)  Zocor 40 Mg Tabs (Simvastatin) .... Take one tablet by mouth at bedtime. 7)  Januvia 100 Mg Tabs (Sitagliptin phosphate) .... Take one tablet daily 8)  Ultram 50 Mg Tabs (Tramadol hcl) .Marland Kitchen.. 1-2 by mouth every 6 hours as needed 9)  One Touch Ultra Strips  .... Accu check two times a day  Other Orders: Venipuncture (53664) TLB-Lipid Panel (80061-LIPID) TLB-BMP (Basic Metabolic Panel-BMET) (80048-METABOL) TLB-CBC Platelet - w/Differential (85025-CBCD) TLB-Hepatic/Liver Function Pnl (80076-HEPATIC) TLB-TSH (Thyroid Stimulating Hormone) (84443-TSH)  Patient Instructions: 1)  reschedule cpe Prescriptions: ONE TOUCH ULTRA STRIPS accu check two times a day  #60 x 11   Entered and Authorized by:   Loreen Freud DO   Signed by:   Loreen Freud DO on 08/22/2009   Method used:   [facility]ed to ...       [facility]  [address] [phone][facility] (retail)       [address]       [place], [place]  [address]       Ph: [phone] or [phone]       Fax: [phone]   RxID:   [id number] ALPRAZOLAM 0.5 MG  TB24 (ALPRAZOLAM) as needed  #90 x 0   Entered and Authorized by:   Loreen Freud DO   Signed by:   Loreen Freud DO on 08/22/2009   Method used:   Print then Give to Patient   RxID:   [id number] ZOCOR 40 MG  TABS (SIMVASTATIN) Take one tablet by mouth at bedtime.  #90 x 3   Entered and Authorized by:    Loreen Freud DO   Signed by:   Loreen Freud DO on 08/22/2009   Method used:   [facility]ron[facility]        [facility]  [address] [phone]* (retail)       [address]       [place], [place]  [address]       Ph: [phone] or [phone]       Fax: [phone]   RxID:   [id number] AMARYL 4 MG TABS (GLIMEPIRIDE) Take 1 tablet by mouth twice a day  #180 x 3   Entered and Authorized by:   Loreen Freud DO   Signed by:   Loreen Freud DO on 08/22/2009   Method used:  [facility]        [facility]  [address] [phone][facility] (retail)       [address]       [place], [place]  [address]       Ph: [phone] or [phone]       Fax: [phone]   RxID:   [id number] LISINOPRIL 20 MG  TABS (LISINOPRIL) 1 by mouth once daily -----  cancel lisinopril 10  #90 x 3   Entered and Authorized by:   Loreen Freud DO   Signed by:   Loreen Freud DO on 08/22/2009   Method used:   [facility]        [facility]  [address] [phone]* (retail)       [address]       [place], [place]  [address]       Ph: [phone] or [phone]       Fax: [phone]   RxID:   [id number]   Laboratory Results   Urine Tests   Date/Time Reported: August 22, 2009 11:07 AM   Routine Urinalysis   Color: straw Appearance: Clear Glucose: negative   (Normal Range: Negative) Bilirubin: negative   (Normal Range: Negative) Ketone: negative   (Normal Range: Negative) Spec. Gravity: 1.020   (Normal Range: 1.003-1.035) Blood: negative   (Normal Range: Negative) pH: 5.0   (Normal Range: 5.0-8.0) Protein: trace   (Normal Range: Negative) Urobilinogen: negative   (Normal Range: 0-1) Nitrite: negative   (Normal Range: Negative) Leukocyte Esterace: negative   (Normal Range: Negative)    Comments: Floydene Flock  August 22, 2009 11:08 AM

## 2010-07-25 NOTE — Progress Notes (Signed)
Summary: EYE EXAM NEEDED  Phone Note Outgoing Call   Call placed by: Jeremy Johann CMA,  February 08, 2010 9:45 AM Details for Reason: let pt know we received notification from ins co that she needed an eye exam   Summary of Call: left message to call OFFICE ....................Marland KitchenFelecia Deloach CMA  February 08, 2010 9:45 AM  left message to call  office............Marland KitchenFelecia Deloach CMA  February 10, 2010 4:56 PM  pt aware.............Marland KitchenFelecia Deloach CMA  February 14, 2010 11:18 AM

## 2010-07-25 NOTE — Procedures (Signed)
Summary: Colon   Colonoscopy  Procedure date:  06/06/2005  Findings:      Location:  [facility].   Patient Name: Beth Shepard, Beth Shepard MRN:  Procedure Procedures: Colonoscopy CPT: 807 055 4110.  Personnel: Endoscopist: Vania Rea. Jarold Motto, MD.  Exam Location: Exam performed in Outpatient Clinic. Outpatient  Patient Consent: Procedure, Alternatives, Risks and Benefits discussed, consent obtained, from patient. Consent was obtained by the RN.  Indications Symptoms: Diarrhea Abdominal pain / bloating.  History  Current Medications: Patient is not currently taking Coumadin.  Pre-Exam Physical: Performed Jun 06, 2005. Cardio-pulmonary exam, Rectal exam, Abdominal exam, Extremity exam, Mental status exam WNL.  Comments: Patient history reviewed and updated, pre-procedure physical performed prior to initiation of sedation? Exam Exam: Extent of exam reached: Cecum, extent intended: Cecum.  The cecum was identified by appendiceal orifice and IC valve. Patient position: on left side. Duration of exam: 30 minutes. Colon retroflexion performed. Images taken. ASA Classification: II. Tolerance: excellent.  Monitoring: Pulse and BP monitoring, Oximetry used. Supplemental O2 given. at 2 Liters.  Colon Prep Used Golytely for colon prep. Prep results: excellent.  Sedation Meds: Patient assessed and found to be appropriate for moderate (conscious) sedation. Fentanyl 125 mcg. given IV. Versed 12 given IV.  Instrument(s): CF 140L. Serial [id number].  Findings - NORMAL EXAM: Cecum to Rectum. Not Seen: Polyps. AVM's. Colitis. Tumors. Melanosis. Crohn's. Diverticulosis. Hemorrhoids.    Comments: VERY long and redundant colon..... Assessment Normal examination.  Comments: Severe IBS....... Events  Unplanned Interventions: No intervention was required.  Plans Medication Plan: Fiber supplements: Methylcellulose 1 tsp QAM, starting Jun 06, 2005 for indefinitely.   Patient  Education: Patient given standard instructions for: High fiber diet.  Comments: Librax 1 tid.... Disposition: After procedure patient sent to recovery. After recovery patient sent home.     This report was created from the original endoscopy report, which was reviewed and signed by the above listed endoscopist.

## 2010-07-25 NOTE — Letter (Signed)
Summary: Fax Regarding Reevaluating Cardiovascular Risk in Diabetes[facility]  [facility]ax Regarding Reevaluating Cardiovascular Risk in Diabetes/[facility]   Imported By: Lanelle Bal 04/18/2010 12:39:05  _____________________________________________________________________  External Attachment:    Type:   Image     Comment:   External Document

## 2010-07-27 NOTE — Medication Information (Signed)
Summary: Possible Discontinuatin of Simvastatin/Alere  Possible Discontinuatin of Simvastatin/Alere   Imported By: Lanelle Bal 07/03/2010 10:08:20  _____________________________________________________________________  External Attachment:    Type:   Image     Comment:   External Document

## 2010-07-27 NOTE — Letter (Signed)
Summary: Care Consideration Regarding A1C Monitoring/Active Health Mgmt  Care Consideration Regarding A1C Monitoring/Active Health Mgmt   Imported By: Lanelle Bal 07/03/2010 10:06:56  _____________________________________________________________________  External Attachment:    Type:   Image     Comment:   External Document

## 2010-07-27 NOTE — Medication Information (Signed)
Summary: Formulary Letter Regarding Dexilant/[facility]  Formulary Letter Regarding Dexilant/[facility]   Imported By: Lanelle Bal 06/06/2010 09:17:31  _____________________________________________________________________  External Attachment:    Type:   Image     Comment:   External Document

## 2010-08-25 ENCOUNTER — Ambulatory Visit: Payer: Self-pay | Admitting: Family Medicine

## 2010-08-29 ENCOUNTER — Encounter: Payer: Self-pay | Admitting: Family Medicine

## 2010-08-29 ENCOUNTER — Other Ambulatory Visit: Payer: Self-pay | Admitting: Family Medicine

## 2010-08-29 ENCOUNTER — Ambulatory Visit (INDEPENDENT_AMBULATORY_CARE_PROVIDER_SITE_OTHER): Payer: BC Managed Care – PPO | Admitting: Family Medicine

## 2010-08-29 ENCOUNTER — Telehealth (INDEPENDENT_AMBULATORY_CARE_PROVIDER_SITE_OTHER): Payer: Self-pay | Admitting: *Deleted

## 2010-08-29 DIAGNOSIS — IMO0001 Reserved for inherently not codable concepts without codable children: Secondary | ICD-10-CM | POA: Insufficient documentation

## 2010-08-29 DIAGNOSIS — I10 Essential (primary) hypertension: Secondary | ICD-10-CM

## 2010-08-29 DIAGNOSIS — E119 Type 2 diabetes mellitus without complications: Secondary | ICD-10-CM

## 2010-08-29 DIAGNOSIS — E785 Hyperlipidemia, unspecified: Secondary | ICD-10-CM

## 2010-08-29 LAB — CONVERTED CEMR LAB
Bilirubin Urine: NEGATIVE
Blood in Urine, dipstick: NEGATIVE
Glucose, Urine, Semiquant: NEGATIVE
Ketones, urine, test strip: NEGATIVE
Nitrite: NEGATIVE
Specific Gravity, Urine: 1.025
pH: 5

## 2010-08-29 LAB — BASIC METABOLIC PANEL
Chloride: 108 mEq/L (ref 96–112)
Creatinine, Ser: 0.9 mg/dL (ref 0.4–1.2)
Potassium: 4.9 mEq/L (ref 3.5–5.1)
Sodium: 143 mEq/L (ref 135–145)

## 2010-08-29 LAB — CBC WITH DIFFERENTIAL/PLATELET
Basophils Relative: 0.5 % (ref 0.0–3.0)
Eosinophils Relative: 1.2 % (ref 0.0–5.0)
HCT: 38.6 % (ref 36.0–46.0)
MCV: 86.4 fl (ref 78.0–100.0)
Monocytes Absolute: 0.5 10*3/uL (ref 0.1–1.0)
Neutrophils Relative %: 69.4 % (ref 43.0–77.0)
RBC: 4.47 Mil/uL (ref 3.87–5.11)
WBC: 8.6 10*3/uL (ref 4.5–10.5)

## 2010-08-29 LAB — MICROALBUMIN / CREATININE URINE RATIO: Microalb Creat Ratio: 3 mg/g (ref 0.0–30.0)

## 2010-08-29 LAB — H. PYLORI ANTIBODY, IGG: H Pylori IgG: NEGATIVE

## 2010-08-29 LAB — HEPATIC FUNCTION PANEL
ALT: 19 U/L (ref 0–35)
Bilirubin, Direct: 0.1 mg/dL (ref 0.0–0.3)
Total Protein: 7 g/dL (ref 6.0–8.3)

## 2010-08-29 LAB — LIPID PANEL: Cholesterol: 194 mg/dL (ref 0–200)

## 2010-08-29 LAB — HM DIABETES FOOT EXAM

## 2010-08-30 ENCOUNTER — Telehealth (INDEPENDENT_AMBULATORY_CARE_PROVIDER_SITE_OTHER): Payer: Self-pay | Admitting: *Deleted

## 2010-08-30 ENCOUNTER — Encounter: Payer: Self-pay | Admitting: Family Medicine

## 2010-08-30 LAB — CONVERTED CEMR LAB: Vit D, 25-Hydroxy: 16 ng/mL — ABNORMAL LOW (ref 30–89)

## 2010-09-05 NOTE — Progress Notes (Signed)
Summary: refill  Phone Note Refill Request Message from:  Fax from Pharmacy on August 29, 2010 4:15 PM  Refills Requested: Medication #1:  DEXILANT 60 MG CPDR 1 by mouth once daily [facility] [address] - fax [phone] new rx for 90 day supply  Initial call taken by: Okey Regal [place],  August 29, 2010 4:16 PM    Prescriptions: DEXILANT 60 MG CPDR (DEXLANSOPRAZOLE) 1 by mouth once daily  #90 x 3   Entered by:   Almeta Monas CMA (AAMA)   Authorized by:   Loreen Freud DO   Signed by:   Almeta Monas CMA (AAMA) on 08/29/2010   Method used:   [facility]  [address] [phone][facility])       [address]       [place], [place]  [address]       Ph: [phone] or [phone]       Fax: [phone]   RxID:   [id number]

## 2010-09-05 NOTE — Progress Notes (Signed)
Summary: prior auth APPROVED DEXILANT [facility]  Phone Note Refill Request Message from:  Fax from Pharmacy on August 30, 2010 11:18 AM  Refills Requested: Medication #1:  DEXILANT 60 MG CPDR 1 by mouth once daily prior auth - [id number] or use generic PPI ---------[facility] fax [phone]   Initial call taken by: Dannielle Karvonen,  August 30, 2010 11:20 AM  Follow-up for Phone Call        Prior Auth approved from 08-30-10 until 08-29-12, awaiting approval letter......Marland KitchenFelecia Deloach CMA  August 30, 2010 4:27 PM   Approval letter scan to chart, pharmacy notified....Marland KitchenMarland KitchenFelecia Deloach CMA  August 30, 2010 4:59 PM

## 2010-09-05 NOTE — Assessment & Plan Note (Signed)
Summary: cut inside of mouth, needs meds refilled   Vital Signs:  Patient profile:   55 year old female Height:      64 inches Weight:      188.8 pounds BMI:     32.52 Temp:     98.7 degrees F oral BP sitting:   108 / 66  (left arm) Cuff size:   large  Vitals Entered By: Almeta Monas CMA Duncan Dull) (August 29, 2010 9:12 AM) CC: needs meds refilled---fasting for labs. she has not been taking meds because she ran out. (zocor)   History of Present Illness:  Hyperlipidemia follow-up      This is a 55 year old woman who presents for Hyperlipidemia follow-up.  The patient denies muscle aches, GI upset, abdominal pain, flushing, itching, constipation, diarrhea, and fatigue.  The patient denies the following symptoms: chest pain/pressure, exercise intolerance, dypsnea, palpitations, syncope, and pedal edema.  Compliance with medications (by patient report) has been near 100%--- she did run out of meds.  Dietary compliance has been good.  The patient reports no exercise.  Adjunctive measures currently used by the patient include ASA.    Type 1 diabetes mellitus follow-up      The patient is also here for Type 2 diabetes mellitus follow-up.  The patient denies polyuria, polydipsia, blurred vision, self managed hypoglycemia, hypoglycemia requiring help, weight loss, weight gain, and numbness of extremities.  The patient denies the following symptoms: neuropathic pain, chest pain, vomiting, orthostatic symptoms, poor wound healing, intermittent claudication, vision loss, and foot ulcer.  Since the last visit the patient reports good dietary compliance, not exercising regularly, and monitoring blood glucose.  The patient has been measuring capillary blood glucose before breakfast and after lunch.  Since the last visit, the patient reports having had eye care by an ophthalmologist.  Pt has run out of all meds.       Hypertension follow-up      The patient also presents for Hypertension follow-up.  The  patient denies lightheadedness, urinary frequency, headaches, edema, impotence, rash, and fatigue.  The patient denies the following associated symptoms: chest pain, chest pressure, exercise intolerance, dyspnea, palpitations, syncope, leg edema, and pedal edema.  Compliance with medications (by patient report) has been near 100%.  The patient reports that dietary compliance has been good.  The patient reports no exercise.  Adjunctive measures currently used by the patient include salt restriction.    Preventive Screening-Counseling & Management  Alcohol-Tobacco     Smoking Status: never  Caffeine-Diet-Exercise     Does Patient Exercise: no      Drug Use:  no.    Current Medications (verified): 1)  Dexilant 60 Mg Cpdr (Dexlansoprazole) .Marland Kitchen.. 1 By Mouth Once Daily 2)  Lisinopril 20 Mg  Tabs (Lisinopril) .Marland Kitchen.. 1 By Mouth Once Daily -----  Cancel Lisinopril 10 3)  Librax 2.5-5 Mg Caps (Clidinium-Chlordiazepoxide) .Marland Kitchen.. 1 By Mouth Once Daily As Needed 4)  Amaryl 4 Mg Tabs (Glimepiride) .... Take 1 Tablet By Mouth Twice A Day 5)  Alprazolam 0.5 Mg  Tb24 (Alprazolam) .... As Needed 6)  Zocor 40 Mg  Tabs (Simvastatin) .... Take One Tablet By Mouth At Bedtime. 7)  Ultram 50 Mg Tabs (Tramadol Hcl) .Marland Kitchen.. 1-2 By Mouth Every 6 Hours As Needed 8)  One Touch Ultra Strips .... Accu Check Two Times A Day  Allergies (verified): 1)  * Codeine 2)  * Ibuprofen  Past History:  Social History: Last updated: 08/29/2010 Occupation: Married Never Smoked  Alcohol use-no Drug use-no Regular exercise-no  Risk Factors: Exercise: no (08/29/2010)  Risk Factors: Smoking Status: never (08/29/2010)  Past medical, surgical, family and social histories (including risk factors) reviewed for relevance to current acute and chronic problems.  Past Medical History: Reviewed history from 12/17/2008 and no changes required. COPD Diabetes mellitus, type  II GERD Hyperlipidemia Hypertension Anxiety Shingles  Past Surgical History: Reviewed history from 10/08/2006 and no changes required. Hysterectomy Cholecystectomy  Family History: Reviewed history and no changes required.  Social History: Reviewed history and no changes required. Occupation: Married Never Smoked Alcohol use-no Drug use-no Regular exercise-no Occupation:  employed Smoking Status:  never Drug Use:  no Does Patient Exercise:  no  Review of Systems      See HPI  Physical Exam  General:  Well-developed,well-nourished,in no acute distress; alert,appropriate and cooperative throughout examination Lungs:  Normal respiratory effort, chest expands symmetrically. Lungs are clear to auscultation, no crackles or wheezes. Heart:  normal rate and no murmur.   Extremities:  No clubbing, cyanosis, edema, or deformity noted with normal full range of motion of all joints.    Diabetes Management Exam:    Foot Exam (with socks and/or shoes not present):       Sensory-Pinprick/Light touch:          Left medial foot (L-4): normal          Left dorsal foot (L-5): normal          Left lateral foot (S-1): normal          Right medial foot (L-4): normal          Right dorsal foot (L-5): normal          Right lateral foot (S-1): normal       Sensory-Monofilament:          Left foot: normal          Right foot: normal       Inspection:          Left foot: normal          Right foot: normal       Nails:          Left foot: normal          Right foot: normal    Eye Exam:       Eye Exam done elsewhere   Impression & Recommendations:  Problem # 1:  HYPERLIPIDEMIA (ICD-272.4)  The following medications were removed from the medication list:    Antara 130 Mg Caps (Fenofibrate micronized) .Marland Kitchen... 1 by mouth daily. Her updated medication list for this problem includes:    Zocor 40 Mg Tabs (Simvastatin) .Marland Kitchen... Take one tablet by mouth at bedtime.  Orders: Venipuncture  ([address]109) TLB-Lipid Panel (80061-LIPID) TLB-BMP (Basic Metabolic Panel-BMET) (80048-METABOL) TLB-CBC Platelet - w/Differential (85025-CBCD) TLB-Hepatic/Liver Function Pnl (80076-HEPATIC) TLB-TSH (Thyroid Stimulating Hormone) (84443-TSH) TLB-H. Pylori Abs(Helicobacter Pylori) (86677-HELICO) TLB-A1C / Hgb A1C (Glycohemoglobin) (83036-A1C) TLB-Microalbumin/Creat Ratio, Urine (82043-MALB) Specimen Handling (60454) UA Dipstick W/ Micro (manual) (81000)  Labs Reviewed: SGOT: 29 (08/22/2009)   SGPT: 45 (08/22/2009)   HDL:45.70 (08/22/2009), 33.90 (03/17/2009)  LDL:98 (08/22/2009), DEL (09/81/1914)  Chol:177 (08/22/2009), 189 (03/17/2009)  Trig:165.0 (08/22/2009), 206.0 (03/17/2009)  Problem # 2:  HYPERTENSION (ICD-401.9)  Her updated medication list for this problem includes:    Lisinopril 20 Mg Tabs (Lisinopril) .Marland Kitchen... 1 by mouth once daily -----  cancel lisinopril 10  Orders: Venipuncture (78295) TLB-Lipid Panel (80061-LIPID) TLB-BMP (Basic Metabolic Panel-BMET) (80048-METABOL) TLB-CBC Platelet -  w/Differential (85025-CBCD) TLB-Hepatic/Liver Function Pnl (80076-HEPATIC) TLB-TSH (Thyroid Stimulating Hormone) (84443-TSH) TLB-H. Pylori Abs(Helicobacter Pylori) (86677-HELICO) TLB-A1C / Hgb A1C (Glycohemoglobin) (83036-A1C) TLB-Microalbumin/Creat Ratio, Urine (82043-MALB) Specimen Handling (04540) UA Dipstick W/ Micro (manual) (81000)  BP today: 108/66 Prior BP: 130/84 (08/22/2009)  Labs Reviewed: K+: 3.8 (08/22/2009) Creat: : 0.9 (08/22/2009)   Chol: 177 (08/22/2009)   HDL: 45.70 (08/22/2009)   LDL: 98 (08/22/2009)   TG: 165.0 (08/22/2009)  Problem # 3:  DIABETES MELLITUS, TYPE II (ICD-250.00)  The following medications were removed from the medication list:    Januvia 100 Mg Tabs (Sitagliptin phosphate) .Marland Kitchen... Take one tablet daily Her updated medication list for this problem includes:    Lisinopril 20 Mg Tabs (Lisinopril) .Marland Kitchen... 1 by mouth once daily -----  cancel lisinopril 10     Amaryl 4 Mg Tabs (Glimepiride) .Marland Kitchen... Take 1 tablet by mouth twice a day  Labs Reviewed: Creat: 0.9 (08/22/2009)    Reviewed HgBA1c results: 6.1 (08/22/2009)  7.1 (03/17/2009)  Complete Medication List: 1)  Dexilant 60 Mg Cpdr (Dexlansoprazole) .Marland Kitchen.. 1 by mouth once daily 2)  Lisinopril 20 Mg Tabs (Lisinopril) .Marland Kitchen.. 1 by mouth once daily -----  cancel lisinopril 10 3)  Librax 2.5-5 Mg Caps (Clidinium-chlordiazepoxide) .Marland Kitchen.. 1 by mouth once daily as needed 4)  Amaryl 4 Mg Tabs (Glimepiride) .... Take 1 tablet by mouth twice a day 5)  Alprazolam 0.5 Mg Tb24 (Alprazolam) .... As needed 6)  Zocor 40 Mg Tabs (Simvastatin) .... Take one tablet by mouth at bedtime. 7)  Ultram 50 Mg Tabs (Tramadol hcl) .Marland Kitchen.. 1-2 by mouth every 6 hours as needed 8)  One Touch Ultra Strips  .... Accu check two times a day  Other Orders: T-Vitamin D (25-Hydroxy) [id number])  Patient Instructions: 1)  rto cpe  Prescriptions: ALPRAZOLAM 0.5 MG  TB24 (ALPRAZOLAM) as needed  #90 x 0   Entered and Authorized by:   Loreen Freud DO   Signed by:   Loreen Freud DO on 08/29/2010   Method used:   Print then Give to Patient   RxID:   [id number] ULTRAM 50 MG TABS (TRAMADOL HCL) 1-2 by mouth every 6 hours as needed  #180 Tablet x 0   Entered and Authorized by:   Loreen Freud DO   Signed by:   Loreen Freud DO on 08/29/2010   Method used:   [facility]  [address] [phone][facility] ([facility]ail[facility]       [address]       [place], [place]  [address]       Ph: [phone] or [phone]       Fax: [phone]   RxID:   [id number] ZOCOR 40 MG  TABS (SIMVASTATIN) Take one tablet by mouth at bedtime.  #90 x 3   Entered and Authorized by:   Loreen Freud DO   Signed by:   Loreen Freud DO on 08/29/2010   Method used:   [facility]        [facility]  [address] [phone][facility] (retail)       [address]       [place], [place]  [address]       Ph: [phone] or [phone]       Fax:  [phone]   RxID:   [id number] AMARYL 4 MG TABS (GLIMEPIRIDE) Take 1 tablet by mouth twice a day  #  180 x 3   Entered and Authorized by:   Loreen Freud DO   Signed by:   Loreen Freud DO on 08/29/2010   Method used:   [facility]        [facility]  [address] [phone]* (retail)       [address]       [place], [place]  [address]       Ph: [phone] or [phone]       Fax: [phone]   RxID:   [id number] LISINOPRIL 20 MG  TABS (LISINOPRIL) 1 by mouth once daily -----  cancel lisinopril 10  #90 x 3   Entered and Authorized by:   Loreen Freud DO   Signed by:   Loreen Freud DO on 08/29/2010   Method used:   [facility]        [facility]  [address] [phone][facility] ([facility]ail[facility]       [address]       [place], [place]  [address]       Ph: [phone] or [phone]       Fax: [phone]   RxID:   [id number] DEXILANT 60 MG CPDR (DEXLANSOPRAZOLE) 1 by mouth once daily  #30 x 11   Entered and Authorized by:   Loreen Freud DO   Signed by:   Loreen Freud DO on 08/29/2010   Method used:   [facility]        [facility]  [address] [phone][facility] (retail)       [address]       [place], [place]  [address]       Ph: [phone] or [phone]       Fax: [phone]   RxID:   [id number]    Orders Added: 1)  Venipuncture [25852] 2)  TLB-Lipid Panel [80061-LIPID] 3)  TLB-BMP (Basic Metabolic Panel-BMET) [80048-METABOL] 4)  TLB-CBC Platelet - w/Differential [85025-CBCD] 5)  TLB-Hepatic/Liver Function Pnl [80076-HEPATIC] 6)  TLB-TSH (Thyroid Stimulating Hormone) [84443-TSH] 7)  TLB-H. Pylori Abs(Helicobacter Pylori) [86677-HELICO] 8)  TLB-A1C / Hgb A1C (Glycohemoglobin) [83036-A1C] 9)  TLB-Microalbumin/Creat Ratio, Urine [82043-MALB] 10)  T-Vitamin D (25-Hydroxy) [77824-23536] 11)  Specimen Handling [99000] 12)  UA Dipstick W/ Micro (manual) [81000] 13)  Est. Patient Level IV [14431]    Laboratory Results   Urine  Tests   Date/Time Reported: August 29, 2010 11:32 AM   Routine Urinalysis   Color: yellow Appearance: Clear Glucose: negative   (Normal Range: Negative) Bilirubin: negative   (Normal Range: Negative) Ketone: negative   (Normal Range: Negative) Spec. Gravity: 1.025   (Normal Range: 1.003-1.035) Blood: negative   (Normal Range: Negative) pH: 5.0   (Normal Range: 5.0-8.0) Protein: negative   (Normal Range: Negative) Urobilinogen: negative   (Normal Range: 0-1) Nitrite: negative   (Normal Range: Negative) Leukocyte Esterace: negative   (Normal Range: Negative)    Comments: Floydene Flock  August 29, 2010 11:32 AM

## 2010-09-12 NOTE — Medication Information (Signed)
Summary: Dexilant Approved  Dexilant Approved   Imported By: Maryln Gottron 09/06/2010 14:45:37  _____________________________________________________________________  External Attachment:    Type:   Image     Comment:   External Document

## 2010-09-22 ENCOUNTER — Telehealth: Payer: Self-pay | Admitting: *Deleted

## 2010-09-22 MED ORDER — CYCLOBENZAPRINE HCL 10 MG PO TABS: 10.0000 mg | ORAL_TABLET | Freq: Three times a day (TID) | ORAL | Status: AC | PRN

## 2010-09-22 NOTE — Telephone Encounter (Signed)
Pt aware -[facility] [place]      KP

## 2010-09-22 NOTE — Telephone Encounter (Signed)
Flexeril 10 mg 1 po tid prn #15----if no relief go to UC or ov monday

## 2010-09-22 NOTE — Telephone Encounter (Signed)
Pt states that she is having hip and back discomfort. Pt notes that she heard something pop and now is have spasm. Pt indicated that this is a on going issue that she has discuss with you previously and normally a Rx of flexeril along with her bath and soaks helps to relieve problem. Pt would like to know a Rx for flexeril can be Rx. Please advise

## 2010-11-10 NOTE — Assessment & Plan Note (Signed)
[facility]                              CARDIOLOGY OFFICE NOTE   Beth Shepard, Beth Shepard                       MRN:          [id number]  DATE:01/03/2006                            DOB:          1955-09-07    REASON FOR VISIT:  Followup discussion regarding cardiac catheterization.   HISTORY OF PRESENT ILLNESS:  I saw Beth Shepard back on November 23, 2005.  Her  history is well detailed in my note from that date.  She has been  experiencing dyspnea on exertion with intermittent chest pain, and was  referred for a Myoview, as well as an echocardiogram, which were both  abnormal, as outlined previously.  In summary, she was noted to have an  ejection fraction of 48% with no diagnostic electrocardiographic changes  during stress testing; however, perfusion imaging demonstrated breast  attenuation, as well as possible transient ischemic dilatation, raising the  possibility of balanced ischemia without a focal defect.  Her  echocardiography showed an ejection fraction of 50% with mild mitral  regurgitation and hypokinesis of the inferior septum.  I recommended the  possibility of proceeding with diagnostic catheterization at her last visit,  and reviewed the risks and benefits with her at that time.  She wanted to  check with her insurance Beth Shepard and think about this a bit more,  ultimately electing to proceed on January 11, 2006.  The procedure has actually  already been scheduled for this date.   Symptomatically, she states that she has mostly been worried about things  and is still experiencing dyspnea and chest pain.  I reiterated the results  of her study today and answered her questions.  She has been fairly dogmatic  about wanting to keep the specifics of her evaluation to herself, and she  has specifically not told her daughters about her ongoing evaluation.  She  has also specifically requested that the results of her catheterization be  discussed with  her first before telling any other family members present.   ALLERGIES:  CODEINE.   PRESENT MEDICATIONS:  1.  Nexium 40 mg p.o. daily.  2.  Avandaryl 4/1 mg p.o. b.i.d.  3.  Altace 2.5 mg p.o. daily.  4.  Potassium 20 mEq p.o. b.i.d.  5.  Librax t.i.d. p.r.n.  6.  Naproxen 500 mg p.o. daily p.r.n.  7.  Vytorin 10/20 mg p.o. daily.   REVIEW OF SYSTEMS:  As described in the history of present illness.   EXAMINATION:  VITAL SIGNS:  Blood pressure 149/96, heart rate 101.  Weight  221 pounds.  GENERAL:  The patient is in no acute distress.  NECK:  No elevated jugular venous pressure.  LUNGS:  Clear.  CARDIAC:  Regular rate and rhythm without murmur or S3 gallop.   IMPRESSION AND RECOMMENDATIONS:  1.  History of dyspnea on exertion and intermittent chest pain.  Screening      cardiac testing including an echocardiogram and myocardial perfusion      study were abnormal as outlined above, although her resting      electrocardiogram is nonspecific.  I have  reviewed with her in the past      indications for and potential risks and benefits associated with      diagnostic cardiac catheterization.  After considering the matter, she      is now in agreement to proceed, and has specifically requested January 11, 2006.  I did explain to her that I will be on vacation during this time,      and she is comfortable proceeding with Dr. Juanda Chance, with which the      procedure has been scheduled.  She has specifically requested that the      results of her catheterization not be discussed with any of her family      members until she is apprised of the situation first.  Her laboratories      and x-ray will be obtained today, and will plan to proceed on as      scheduled.  2.  Beth Shepard, in addition, states that she has had some swelling in her      lower extremities, and previously took hydrochlorothiazide for this.      She states that she was taken off of this originally because she       developed hypokalemia, although has been on potassium supplements      regularly now, and her potassium from laboratory data in May of this      year was within normal limits at 4.2.  I have asked her to continue her      potassium and reinstitute hydrochlorothiazide at 25 mg p.o. daily.  3.  Further plans to follow.                                Jonelle Sidle, MD    SGM/MedQ  DD:  01/03/2006  DT:  01/04/2006  Job #:  [id number]   cc:   Loreen Freud, MD

## 2010-11-10 NOTE — Assessment & Plan Note (Signed)
[facility]                              CARDIOLOGY OFFICE NOTE   Beth Shepard, Beth Shepard                       MRN:          [id number]  DATE:01/16/2006                            DOB:          02/16/1956    This note is to summarize the recent change in plan for Beth Shepard.  I last  saw her on January 03, 2006 as outlined in my note of that date.  The plan at  that time, per my recommendation, was to proceed with an outpatient  diagnostic cardiac catheterization, given her abnormal findings on both  myocardial perfusion imaging and echocardiography.  I reviewed the potential  risks and benefits with her at that time, and she was in agreement to  proceed.  She subsequently cancelled her cardiac catheterization date, and  following speaking with some family members, did some research into the  possibility of a coronary CT angiogram.  She decided that she would prefer  that test, particularly because it was non-invasive, and called out office  to see if this could be arranged.  This was discussed with me, and my  concerns about this were the limitations of cardiac CT angiography,  specifically regarding grading of coronary lesions, radiation dose, in light  of the potential need for ultimately a confirmatory cardiac catheterization,  any the fact that we had already proceeded with non-invasive testing without  a clear diagnosis.  These concerns were relayed to the patient, and, in  fact, reinforced following a call from me to her.  She still preferred  pursuing a cardiac CT angiogram, and we proceeded with the precertification  process through her insurance carrier, which is [facility].  Charmaine walked through this process, actually proceeding through several  levels of review to a point that it was felt that a peer review was  necessary with me.  I placed a telephone call today and had a peer review  with a physician that told me that it was a  policy of that particular health  care [facility] not to precertify any cardiac CT angiograms, as they felt that  it was an unproven testing modality.  We, therefore, relayed this  information to the patient.  The choices now to consider are continuing on  with prior plans for a diagnostic cardiac catheterization.  The patient  could also, if she felt very strongly about the situation, pay for a cardiac  CT scan out of pocket.  We have relayed this information to her, and she is  to let us know her decision.                                Jonelle Sidle, MD    SGM/MedQ  DD:  01/16/2006  DT:  01/16/2006  Job #:  [id number]

## 2010-11-10 NOTE — Procedures (Signed)
[name], Beth Shepard              ACCOUNT NO.:  [id number]   MEDICAL RECORD NO.:  [id number]          PATIENT TYPE:  OUT   LOCATION:  SLEEP CENTER                 FACILITY:  [facility]   PHYSICIAN:  Marcelyn Bruins, M.D. [facility] DATE OF BIRTH:  October 20, 1955   DATE OF STUDY:  08/20/2005                              NOCTURNAL POLYSOMNOGRAM   REFERRING PHYSICIAN:  Dr. Lelon Perla.   DATE OF STUDY:  August 20, 2005.   INDICATION FOR STUDY:  Hypersomnia with sleep apnea.   EPWORTH SLEEPINESS SCORE:  6.   SLEEP ARCHITECTURE:  The patient had a total sleep time of 365 minutes with  adequate slow wave sleep but decreased REM. Sleep onset latency was normal  and REM onset was slightly prolonged. Sleep efficiency was 92%.   RESPIRATORY DATA:  The patient was found to have 128 hypopneas and 1 apnea  for a respiratory disturbance index of 21 events per hour. The events were  not positional but were clearly worse during REM. There was moderate snoring  noted throughout. The patient did not meet split night criteria secondary to  the majority of her events occurring in the second half of the night.   OXYGEN DATA:  The patient had O2 desaturation as low as 80% with her  obstructive events.   CARDIAC DATA:  The patient had occasional PVC but no clinically significant  arrhythmia.   MOVEMENT/PARASOMNIA:  There were no significant events noted.   IMPRESSION/RECOMMENDATIONS:  Moderate obstructive sleep apnea with a  respiratory disturbance index of 21 events per hour and O2 desaturation as  low as 80%. The patient did not meet split night criteria secondary to the  majority of her events occurring in the latter half of the night. Treatment  for this degree of sleep apnea may include weight loss alone if applicable,  upper airway surgery, oral appliance, and also C-PAP. Clinical correlation  is suggested.           ______________________________  Marcelyn Bruins, M.D. [facility]  Diplomate, American Board  of Sleep  Medicine    KC/MEDQ  D:  08/30/2005 15:02:34  T:  08/30/2005 23:53:33  Job:  [phone]

## 2010-11-21 ENCOUNTER — Other Ambulatory Visit: Payer: Self-pay | Admitting: Internal Medicine

## 2010-11-21 NOTE — Telephone Encounter (Signed)
DR Laury Axon pt

## 2011-01-19 ENCOUNTER — Telehealth: Payer: Self-pay | Admitting: Family Medicine

## 2011-01-19 MED ORDER — TRAMADOL HCL 50 MG PO TABS: ORAL_TABLET | ORAL | Status: DC

## 2011-01-19 NOTE — Telephone Encounter (Signed)
Last refilled- 11/21/10, #180, no rfs. Last OV- 08/29/10.

## 2011-01-19 NOTE — Telephone Encounter (Signed)
Ok for #60, no refills.  She will need to get more from Dr Laury Axon if needed once she returns from vacation.

## 2011-01-26 ENCOUNTER — Other Ambulatory Visit: Payer: Self-pay | Admitting: Family Medicine

## 2011-01-26 MED ORDER — ALPRAZOLAM 0.5 MG PO TABS: 0.5000 mg | ORAL_TABLET | ORAL | Status: DC | PRN

## 2011-01-26 NOTE — Telephone Encounter (Signed)
Rx faxed

## 2011-01-26 NOTE — Telephone Encounter (Signed)
08-29-10 Last Filled #90, last OV

## 2011-01-26 NOTE — Telephone Encounter (Signed)
OK X1 

## 2011-02-20 ENCOUNTER — Other Ambulatory Visit: Payer: Self-pay | Admitting: Family Medicine

## 2011-02-21 MED ORDER — ALPRAZOLAM 0.5 MG PO TABS: 0.5000 mg | ORAL_TABLET | ORAL | Status: DC | PRN

## 2011-02-21 NOTE — Telephone Encounter (Signed)
Faxed.   KP 

## 2011-02-21 NOTE — Telephone Encounter (Signed)
Last seen 08/29/10 and filled 01/26/11 #90 please advise    KP

## 2011-04-25 ENCOUNTER — Other Ambulatory Visit: Payer: Self-pay | Admitting: Family Medicine

## 2011-07-25 ENCOUNTER — Other Ambulatory Visit: Payer: Self-pay | Admitting: Family Medicine

## 2011-07-25 MED ORDER — ALPRAZOLAM 0.5 MG PO TABS: 0.5000 mg | ORAL_TABLET | ORAL | Status: DC | PRN

## 2011-07-25 MED ORDER — TRAMADOL HCL 50 MG PO TABS: ORAL_TABLET | ORAL | Status: DC

## 2011-07-25 MED ORDER — ALPRAZOLAM 0.5 MG PO TABS: 0.5000 mg | ORAL_TABLET | ORAL | Status: AC | PRN

## 2011-07-25 NOTE — Telephone Encounter (Signed)
Refill x1 each 

## 2011-07-25 NOTE — Telephone Encounter (Signed)
Faxed.   KP 

## 2011-07-25 NOTE — Telephone Encounter (Signed)
Last seen 08/29/10 and filled  Tramadol 01/19/11 # 60 xanax 02/21/11 # 90. Please advise    KP

## 2011-07-25 NOTE — Telephone Encounter (Signed)
Addended by: Arnette Norris on: 07/25/2011 02:35 PM   Modules accepted: Orders

## 2011-08-22 ENCOUNTER — Telehealth: Payer: Self-pay | Admitting: Family Medicine

## 2011-08-22 NOTE — Telephone Encounter (Signed)
Discussed with patient and she voiced understanding- apt schedule 08/31/11 and she will come in on the 11th and have her labs done.    KP

## 2011-08-22 NOTE — Telephone Encounter (Signed)
I told pt that her last CPE was on 08/22/2009 and that she might have to come for a CPE this year in order for Dr. Laury Axon to fill form out but that I was not sure. She was not happy at all with what I said. Pt doesn't want to to pay any additional amount. She also needs to see Dr. Laury Axon in regards to her medication. Best number to reach her today is [phone] and for tomorrow is [phone]. Pt needs form to be filled out as soon as possible.

## 2011-08-22 NOTE — Telephone Encounter (Signed)
Pt called the office asking to schedule an office visit with Dr. Laury Axon. She stated that she needed Dr. Laury Axon to fill a health screening form for her medical insurance but she didn't want the visit to be billed as a physical. Her insurance had informed her that it should be at 100%.

## 2011-08-31 ENCOUNTER — Encounter: Payer: Self-pay | Admitting: Family Medicine

## 2011-08-31 ENCOUNTER — Ambulatory Visit (INDEPENDENT_AMBULATORY_CARE_PROVIDER_SITE_OTHER): Payer: BC Managed Care – PPO | Admitting: Family Medicine

## 2011-08-31 VITALS — BP 136/82 | HR 97 | Temp 98.6°F | Wt 192.4 lb

## 2011-08-31 DIAGNOSIS — E119 Type 2 diabetes mellitus without complications: Secondary | ICD-10-CM

## 2011-08-31 DIAGNOSIS — L0201 Cutaneous abscess of face: Secondary | ICD-10-CM

## 2011-08-31 DIAGNOSIS — E785 Hyperlipidemia, unspecified: Secondary | ICD-10-CM

## 2011-08-31 DIAGNOSIS — I10 Essential (primary) hypertension: Secondary | ICD-10-CM

## 2011-08-31 DIAGNOSIS — J40 Bronchitis, not specified as acute or chronic: Secondary | ICD-10-CM

## 2011-08-31 MED ORDER — ALBUTEROL SULFATE HFA 108 (90 BASE) MCG/ACT IN AERS: 2.0000 | INHALATION_SPRAY | Freq: Four times a day (QID) | RESPIRATORY_TRACT | Status: DC | PRN

## 2011-08-31 MED ORDER — AZITHROMYCIN 250 MG PO TABS: ORAL_TABLET | ORAL | Status: AC

## 2011-08-31 MED ORDER — GLIMEPIRIDE 4 MG PO TABS: 4.0000 mg | ORAL_TABLET | Freq: Every day | ORAL | Status: DC

## 2011-08-31 MED ORDER — PREDNISONE 10 MG PO TABS: ORAL_TABLET | ORAL | Status: DC

## 2011-08-31 MED ORDER — METHYLPREDNISOLONE ACETATE PF 80 MG/ML IJ SUSP
80.0000 mg | Freq: Once | INTRAMUSCULAR | Status: AC
  Administered 2011-08-31: 80 mg via INTRAMUSCULAR

## 2011-08-31 MED ORDER — ALBUTEROL SULFATE (5 MG/ML) 0.5% IN NEBU
2.5000 mg | INHALATION_SOLUTION | Freq: Once | RESPIRATORY_TRACT | Status: AC
  Administered 2011-08-31: 2.5 mg via RESPIRATORY_TRACT

## 2011-08-31 MED ORDER — LISINOPRIL 20 MG PO TABS: 20.0000 mg | ORAL_TABLET | Freq: Every day | ORAL | Status: DC

## 2011-08-31 NOTE — Progress Notes (Signed)
  Subjective:     Beth Shepard is a 56 y.o. female here for evaluation of a cough. Onset of symptoms was 3 weeks ago. Symptoms have been gradually worsening since that time. The cough is productive and is aggravated by exercise and reclining position. Associated symptoms include: fever, shortness of breath, sputum production and wheezing. Patient does not have a history of asthma. Patient does have a history of environmental allergens. Patient has not traveled recently. Patient does not have a history of smoking. Patient has not had a previous chest x-ray. Patient has not had a PPD done.  HYPERTENSION Disease Monitoring Blood pressure range-no Chest pain- no      Dyspnea- yes Medications Compliance- ok Lightheadedness- no   Edema- no   DIABETES Disease Monitoring Blood Sugar ranges-100-180 Polyuria- no New Visual problems- no Medications Compliance- ok Hypoglycemic symptoms- no   HYPERLIPIDEMIA Disease Monitoring See symptoms for Hypertension Medications Compliance- ok RUQ pain- no  Muscle aches- no  ROS See HPI above   PMH Smoking Status noted   The following portions of the patient's history were reviewed and updated as appropriate: allergies, current medications, past family history, past medical history, past social history, past surgical history and problem list.  Review of Systems Pertinent items are noted in HPI.    Objective:    Oxygen saturation 97% on room air BP 136/82  Pulse 97  Temp(Src) 98.6 F (37 C) (Oral)  Wt 192 lb 6.4 oz (87.272 kg)  SpO2 97% General appearance: alert, cooperative, appears stated age and no distress Ears: normal TM's and external ear canals both ears Nose: Nares normal. Septum midline. Mucosa normal. No drainage or sinus tenderness. Throat: lips, mucosa, and tongue normal; teeth and gums normal Neck: no adenopathy, supple, symmetrical, trachea midline and thyroid not enlarged, symmetric, no tenderness/mass/nodules Lungs:  diminished breath sounds bilaterally and wheezes bilaterally Heart: S1, S2 normal  Sensory exam of the foot is normal, tested with the monofilament. Good pulses, no lesions or ulcers, good peripheral pulses.  Assessment:    Acute Bronchitis and COPD with exacerbation    Plan:    Antibiotics per medication orders. Antitussives per medication orders. Avoid exposure to tobacco smoke and fumes. B-agonist inhaler. Call if shortness of breath worsens, blood in sputum, change in character of cough, development of fever or chills, inability to maintain nutrition and hydration. Avoid exposure to tobacco smoke and fumes. Steroid inhaler as ordered.

## 2011-08-31 NOTE — Progress Notes (Signed)
Addended by: Arnette Norris on: 08/31/2011 05:26 PM   Modules accepted: Orders

## 2011-08-31 NOTE — Assessment & Plan Note (Signed)
Check labs con't meds 

## 2011-08-31 NOTE — Patient Instructions (Signed)
Bronchitis Bronchitis is the body's way of reacting to injury and/or infection (inflammation) of the bronchi. Bronchi are the air tubes that extend from the windpipe into the lungs. If the inflammation becomes severe, it may cause shortness of breath. CAUSES  Inflammation may be caused by:  A virus.   Germs (bacteria).   Dust.   Allergens.   Pollutants and many other irritants.  The cells lining the bronchial tree are covered with tiny hairs (cilia). These constantly beat upward, away from the lungs, toward the mouth. This keeps the lungs free of pollutants. When these cells become too irritated and are unable to do their job, mucus begins to develop. This causes the characteristic cough of bronchitis. The cough clears the lungs when the cilia are unable to do their job. Without either of these protective mechanisms, the mucus would settle in the lungs. Then you would develop pneumonia. Smoking is a common cause of bronchitis and can contribute to pneumonia. Stopping this habit is the single most important thing you can do to help yourself. TREATMENT   Your caregiver may prescribe an antibiotic if the cough is caused by bacteria. Also, medicines that open up your airways make it easier to breathe. Your caregiver may also recommend or prescribe an expectorant. It will loosen the mucus to be coughed up. Only take over-the-counter or prescription medicines for pain, discomfort, or fever as directed by your caregiver.   Removing whatever causes the problem (smoking, for example) is critical to preventing the problem from getting worse.   Cough suppressants may be prescribed for relief of cough symptoms.   Inhaled medicines may be prescribed to help with symptoms now and to help prevent problems from returning.   For those with recurrent (chronic) bronchitis, there may be a need for steroid medicines.  SEEK IMMEDIATE MEDICAL CARE IF:   During treatment, you develop more pus-like mucus  (purulent sputum).   You have a fever.   Your baby is older than 3 months with a rectal temperature of 102 F (38.9 C) or higher.   Your baby is 34 months old or younger with a rectal temperature of 100.4 F (38 C) or higher.   You become progressively more ill.   You have increased difficulty breathing, wheezing, or shortness of breath.  It is necessary to seek immediate medical care if you are elderly or sick from any other disease. MAKE SURE YOU:   Understand these instructions.   Will watch your condition.   Will get help right away if you are not doing well or get worse.  Document Released: 06/11/2005 Document Revised: 05/31/2011 Document Reviewed: 04/20/2008 [facility] Patient Information 2012 [place], [place].    Sliding scale      200-250  2 u                             251-300  4u                              301-350  6 u                             351-400 8 u                             >400  10 u and  call md

## 2011-08-31 NOTE — Assessment & Plan Note (Signed)
con't meds  Check labs 

## 2011-08-31 NOTE — Assessment & Plan Note (Signed)
Stable con't meds 

## 2011-09-03 ENCOUNTER — Telehealth: Payer: Self-pay | Admitting: Family Medicine

## 2011-09-03 NOTE — Telephone Encounter (Signed)
[facility] Triage Call Report Triage Record Num: [id number] Operator: Tomasita Crumble Patient Name: Mariaguadalupe Fialkowski Call Date & Time: 09/03/2011 11:56:24AM Patient Phone: [phone] PCP: Lelon Perla Patient Gender: Female PCP Fax : [phone] Patient DOB: 13-Apr-1956 Practice Name: [facility] Reason for Call: Caller: [facility]/Patient; PCP: Lelon Perla.; CB#: [phone]; ; ; Call regarding Started Predinsone 09/01/11. Not sleeping well, GI issues - and makes her nervous. Pharmacy advised to take split dose 1.5 tablets and then take the other 1.5 tablets later after eating for initial dose. Unable to sleep, nausea, reflux. On 3/10 took 1/2 dose in am and 2nd dose at evening meal. Extremely nervous, felt heart was racing; felt wors lying down. Up until 0530 - took Xanax for relief. Feels short of breath. Runny nose. Advised see provider within 24 hours per Diabetes: Respirtatory Problems protocol. Caller refused to schedule appointment. OFFICE, PLEASE FOLLOW UP WITH PT. REGARDING THIS. STATES SHE WOULD LIKE TO KNOW IF SHE SHOULD REDUCE DOSE OR STOP PREDNISONE. Protocol(s) Used: Breathing Problems Protocol(s) Used: Diabetes: Respiratory Problems Recommended Outcome per Protocol: See Provider within 24 hours Reason for Outcome: Diabetes and breathing problems Symptoms began after using an illicit, nonprescription or alternative medication OR beginning new prescription or therapy prescribed by provider Care Advice:

## 2011-09-03 NOTE — Telephone Encounter (Signed)
Patient spoke to CAN @ 10:41am regarding reaction to PredniSONE (Tab)  She is having stomach irritation, heart palpitations & insomnia Please call at home [phone] If after 5 call work phone #  Thanks

## 2011-09-03 NOTE — Telephone Encounter (Signed)
She should not just stop it ---- she can take 1/2 dose though

## 2011-09-03 NOTE — Telephone Encounter (Signed)
Please advise      KP 

## 2011-09-03 NOTE — Telephone Encounter (Signed)
Patient stated she felt better until she took the Prednisone, Per Dr. Laury Axon go ahead and stop since making symptoms worst, patient is to take Qvar 2 puff bid and she stated she was Given that at the OV, she will discontinue the Prednisone and continue with the inhalers and if she had anymore concerns she will call us.    KP

## 2011-09-06 ENCOUNTER — Encounter: Payer: Self-pay | Admitting: Family Medicine

## 2011-09-06 ENCOUNTER — Ambulatory Visit: Payer: BC Managed Care – PPO | Admitting: Family Medicine

## 2011-09-06 ENCOUNTER — Ambulatory Visit (INDEPENDENT_AMBULATORY_CARE_PROVIDER_SITE_OTHER): Payer: BC Managed Care – PPO | Admitting: Family Medicine

## 2011-09-06 DIAGNOSIS — R079 Chest pain, unspecified: Secondary | ICD-10-CM

## 2011-09-06 DIAGNOSIS — J449 Chronic obstructive pulmonary disease, unspecified: Secondary | ICD-10-CM

## 2011-09-06 NOTE — Patient Instructions (Signed)
Chest Pain (Nonspecific) It is often hard to give a specific diagnosis for the cause of chest pain. There is always a chance that your pain could be related to something serious, such as a heart attack or a blood clot in the lungs. You need to follow up with your caregiver for further evaluation. CAUSES   Heartburn.   Pneumonia or bronchitis.   Anxiety or stress.   Inflammation around your heart (pericarditis) or lung (pleuritis or pleurisy).   A blood clot in the lung.   A collapsed lung (pneumothorax). It can develop suddenly on its own (spontaneous pneumothorax) or from injury (trauma) to the chest.   Shingles infection (herpes zoster virus).  The chest wall is composed of bones, muscles, and cartilage. Any of these can be the source of the pain.  The bones can be bruised by injury.   The muscles or cartilage can be strained by coughing or overwork.   The cartilage can be affected by inflammation and become sore (costochondritis).  DIAGNOSIS  Lab tests or other studies, such as X-rays, electrocardiography, stress testing, or cardiac imaging, may be needed to find the cause of your pain.  TREATMENT   Treatment depends on what may be causing your chest pain. Treatment may include:   Acid blockers for heartburn.   Anti-inflammatory medicine.   Pain medicine for inflammatory conditions.   Antibiotics if an infection is present.   You may be advised to change lifestyle habits. This includes stopping smoking and avoiding alcohol, caffeine, and chocolate.   You may be advised to keep your head raised (elevated) when sleeping. This reduces the chance of acid going backward from your stomach into your esophagus.   Most of the time, nonspecific chest pain will improve within 2 to 3 days with rest and mild pain medicine.  HOME CARE INSTRUCTIONS   If antibiotics were prescribed, take your antibiotics as directed. Finish them even if you start to feel better.   For the next few  days, avoid physical activities that bring on chest pain. Continue physical activities as directed.   Do not smoke.   Avoid drinking alcohol.   Only take over-the-counter or prescription medicine for pain, discomfort, or fever as directed by your caregiver.   Follow your caregiver's suggestions for further testing if your chest pain does not go away.   Keep any follow-up appointments you made. If you do not go to an appointment, you could develop lasting (chronic) problems with pain. If there is any problem keeping an appointment, you must call to reschedule.  SEEK MEDICAL CARE IF:   You think you are having problems from the medicine you are taking. Read your medicine instructions carefully.   Your chest pain does not go away, even after treatment.   You develop a rash with blisters on your chest.  SEEK IMMEDIATE MEDICAL CARE IF:   You have increased chest pain or pain that spreads to your arm, neck, jaw, back, or abdomen.   You develop shortness of breath, an increasing cough, or you are coughing up blood.   You have severe back or abdominal pain, feel nauseous, or vomit.   You develop severe weakness, fainting, or chills.   You have a fever.  THIS IS AN EMERGENCY. Do not wait to see if the pain will go away. Get medical help at once. Call your local emergency services (911 in [place] Do not drive yourself to the hospital. MAKE SURE YOU:   Understand these instructions.     Will watch your condition.   Will get help right away if you are not doing well or get worse.  Document Released: 03/21/2005 Document Revised: 05/31/2011 Document Reviewed: 01/15/2008 [employer] Patient Information 2012 [employer]. 

## 2011-09-06 NOTE — Progress Notes (Signed)
  Subjective:    Beth Shepard is a 56 y.o. female who presents for evaluation of chest pain. Onset was 1 day ago. Symptoms have worsened since that time. The patient describes the pain as heaviness, pressure and radiates to the left neck/jaw and mid back. Patient rates pain as a 6/10 in intensity. Associated symptoms are: chest pain and dyspnea. Aggravating factors are: exercise and walking. Alleviating factors are: none. Patient's cardiac risk factors are: diabetes mellitus, dyslipidemia, hypertension and sedentary lifestyle. Patient's risk factors for DVT/PE: none. Previous cardiac testing: electrocardiogram (ECG).  Pt states she feels like an elephant is sitting on her chest.  Depo medrol and z pak helped last week --she has been using inhalers more frequently  The following portions of the patient's history were reviewed and updated as appropriate: allergies, current medications, past family history, past medical history, past social history, past surgical history and problem list.  Review of Systems Pertinent items are noted in HPI.    Objective:    BP 154/100  Pulse 103  Temp(Src) 98.5 F (36.9 C) (Oral)  Wt 190 lb 9.6 oz (86.456 kg)  SpO2 97% General appearance: alert, cooperative and mild distress Lungs: clear to auscultation bilaterally Heart: S1, S2 normal  Cardiographics ECG: new LBBB--compared to ekg 2007  Imaging Chest x-ray: not done--pt sent to er    Assessment:    Chest pain,---with EKG changes---and elevated bp   Aspirin 324mg  given in office   Plan:    To emergency department via ambulance.

## 2011-09-12 ENCOUNTER — Telehealth: Payer: Self-pay | Admitting: Family Medicine

## 2011-09-12 NOTE — Telephone Encounter (Signed)
If the cardiologist is the one who said she did not need the pulm referral than fine ---- doc pt refused referral

## 2011-09-12 NOTE — Telephone Encounter (Signed)
Dr. Laury Axon are you ok with this. Please advise     KP

## 2011-09-12 NOTE — Telephone Encounter (Signed)
Patient called & states that she has a cardiologist and does not need a pulmonary phy. Patient states she has been in the hospital & they reviewed her lungs and she states that they said her lungs were clear Please confirm cancellation with patient ph# [phone]

## 2011-09-13 NOTE — Telephone Encounter (Signed)
I called spoke with [facility], patient appt has been cancelled.  I called spoke with patient, she is aware of cancellation.

## 2011-09-20 ENCOUNTER — Institutional Professional Consult (permissible substitution): Payer: BC Managed Care – PPO | Admitting: Internal Medicine

## 2011-09-24 ENCOUNTER — Other Ambulatory Visit: Payer: BC Managed Care – PPO

## 2011-09-24 ENCOUNTER — Ambulatory Visit (INDEPENDENT_AMBULATORY_CARE_PROVIDER_SITE_OTHER): Payer: BC Managed Care – PPO | Admitting: Family Medicine

## 2011-09-24 ENCOUNTER — Telehealth: Payer: Self-pay | Admitting: Family Medicine

## 2011-09-24 ENCOUNTER — Encounter: Payer: Self-pay | Admitting: Family Medicine

## 2011-09-24 VITALS — BP 100/60 | HR 80 | Temp 98.9°F | Ht 63.0 in | Wt 181.6 lb

## 2011-09-24 DIAGNOSIS — J40 Bronchitis, not specified as acute or chronic: Secondary | ICD-10-CM

## 2011-09-24 DIAGNOSIS — M1990 Unspecified osteoarthritis, unspecified site: Secondary | ICD-10-CM

## 2011-09-24 DIAGNOSIS — Z1239 Encounter for other screening for malignant neoplasm of breast: Secondary | ICD-10-CM

## 2011-09-24 DIAGNOSIS — Z1231 Encounter for screening mammogram for malignant neoplasm of breast: Secondary | ICD-10-CM

## 2011-09-24 DIAGNOSIS — E785 Hyperlipidemia, unspecified: Secondary | ICD-10-CM

## 2011-09-24 DIAGNOSIS — K219 Gastro-esophageal reflux disease without esophagitis: Secondary | ICD-10-CM

## 2011-09-24 DIAGNOSIS — Z780 Asymptomatic menopausal state: Secondary | ICD-10-CM

## 2011-09-24 DIAGNOSIS — G4700 Insomnia, unspecified: Secondary | ICD-10-CM

## 2011-09-24 DIAGNOSIS — I10 Essential (primary) hypertension: Secondary | ICD-10-CM

## 2011-09-24 DIAGNOSIS — K589 Irritable bowel syndrome without diarrhea: Secondary | ICD-10-CM

## 2011-09-24 DIAGNOSIS — E119 Type 2 diabetes mellitus without complications: Secondary | ICD-10-CM

## 2011-09-24 DIAGNOSIS — R319 Hematuria, unspecified: Secondary | ICD-10-CM

## 2011-09-24 DIAGNOSIS — Z0000 Encounter for general adult medical examination without abnormal findings: Secondary | ICD-10-CM

## 2011-09-24 LAB — MICROALBUMIN / CREATININE URINE RATIO
Creatinine,U: 229.5 mg/dL
Microalb Creat Ratio: 0.5 mg/g (ref 0.0–30.0)
Microalb, Ur: 1.1 mg/dL (ref 0.0–1.9)

## 2011-09-24 LAB — CBC WITH DIFFERENTIAL/PLATELET
Basophils Absolute: 0.1 10*3/uL (ref 0.0–0.1)
Eosinophils Relative: 0.9 % (ref 0.0–5.0)
HCT: 37.3 % (ref 36.0–46.0)
Lymphocytes Relative: 20.3 % (ref 12.0–46.0)
Lymphs Abs: 1.9 10*3/uL (ref 0.7–4.0)
Monocytes Relative: 5.1 % (ref 3.0–12.0)
Neutrophils Relative %: 73.1 % (ref 43.0–77.0)
Platelets: 310 10*3/uL (ref 150.0–400.0)
RDW: 12.9 % (ref 11.5–14.6)
WBC: 9.1 10*3/uL (ref 4.5–10.5)

## 2011-09-24 LAB — BASIC METABOLIC PANEL
BUN: 20 mg/dL (ref 6–23)
Calcium: 9.6 mg/dL (ref 8.4–10.5)
GFR: 60.49 mL/min (ref >60.00)
Glucose, Bld: 84 mg/dL (ref 70–99)
Potassium: 4.3 mEq/L (ref 3.5–5.1)

## 2011-09-24 LAB — HEPATIC FUNCTION PANEL
ALT: 21 U/L (ref 0–35)
AST: 21 U/L (ref 0–37)
Bilirubin, Direct: 0 mg/dL (ref 0.0–0.3)
Total Protein: 7.6 g/dL (ref 6.0–8.3)

## 2011-09-24 LAB — HEMOGLOBIN A1C: Hgb A1c MFr Bld: 6.5 % (ref 4.6–6.5)

## 2011-09-24 LAB — POCT URINALYSIS DIPSTICK
Glucose, UA: NEGATIVE
Nitrite, UA: NEGATIVE
Urobilinogen, UA: 0.2

## 2011-09-24 LAB — LIPID PANEL
Cholesterol: 121 mg/dL (ref 0–200)
HDL: 49.6 mg/dL (ref >39.00)
VLDL: 22.8 mg/dL (ref 0.0–40.0)

## 2011-09-24 MED ORDER — PANTOPRAZOLE SODIUM 20 MG PO TBEC: 20.0000 mg | DELAYED_RELEASE_TABLET | Freq: Every day | ORAL | Status: DC

## 2011-09-24 MED ORDER — TRAMADOL HCL 50 MG PO TABS: ORAL_TABLET | ORAL | Status: DC

## 2011-09-24 MED ORDER — SIMVASTATIN 40 MG PO TABS: 40.0000 mg | ORAL_TABLET | Freq: Every evening | ORAL | Status: DC

## 2011-09-24 MED ORDER — ALPRAZOLAM 0.5 MG PO TABS: 0.5000 mg | ORAL_TABLET | Freq: Every evening | ORAL | Status: DC | PRN

## 2011-09-24 MED ORDER — CILIDINIUM-CHLORDIAZEPOXIDE 2.5-5 MG PO CAPS: 1.0000 | ORAL_CAPSULE | Freq: Three times a day (TID) | ORAL | Status: DC | PRN

## 2011-09-24 NOTE — Patient Instructions (Addendum)
1.5 Gram Low Sodium Diet A 1.5 gram sodium diet restricts the amount of sodium in the diet to no more than 1.5 g or 1500 mg daily. The American Heart Association recommends Americans over the age of 20 to consume no more than 1500 mg of sodium each day to reduce the risk of developing high blood pressure. Research also shows that limiting sodium may reduce heart attack and stroke risk. Many foods contain sodium for flavor and sometimes as a preservative. When the amount of sodium in a diet needs to be low, it is important to know what to look for when choosing foods and drinks. The following includes some information and guidelines to help make it easier for you to adapt to a low sodium diet. QUICK TIPS  Do not add salt to food.   Avoid convenience items and fast food.   Choose unsalted snack foods.   Buy lower sodium products, often labeled as "lower sodium" or "no salt added."   Check food labels to learn how much sodium is in 1 serving.   When eating at a restaurant, ask that your food be prepared with less salt or none, if possible.  READING FOOD LABELS FOR SODIUM INFORMATION The nutrition facts label is a good place to find how much sodium is in foods. Look for products with no more than 400 mg of sodium per serving. Remember that 1.5 g = 1500 mg. The food label may also list foods as:  Sodium-free: Less than 5 mg in a serving.   Very low sodium: 35 mg or less in a serving.   Low-sodium: 140 mg or less in a serving.   Light in sodium: 50% less sodium in a serving. For example, if a food that usually has 300 mg of sodium is changed to become light in sodium, it will have 150 mg of sodium.   Reduced sodium: 25% less sodium in a serving. For example, if a food that usually has 400 mg of sodium is changed to reduced sodium, it will have 300 mg of sodium.  CHOOSING FOODS Grains  Avoid: Salted crackers and snack items. Some cereals, including instant hot cereals. Bread stuffing and  biscuit mixes. Seasoned rice or pasta mixes.   Choose: Unsalted snack items. Low-sodium cereals, oats, puffed wheat and rice, shredded wheat. English muffins and bread. Pasta.  Meats  Avoid: Salted, canned, smoked, spiced, pickled meats, including fish and poultry. Bacon, ham, sausage, cold cuts, hot dogs, anchovies.   Choose: Low-sodium canned tuna and salmon. Fresh or frozen meat, poultry, and fish.  Dairy  Avoid: Processed cheese and spreads. Cottage cheese. Buttermilk and condensed milk. Regular cheese.   Choose: Milk. Low-sodium cottage cheese. Yogurt. Sour cream. Low-sodium cheese.  Fruits and Vegetables  Avoid: Regular canned vegetables. Regular canned tomato sauce and paste. Frozen vegetables in sauces. Olives. Pickles. Relishes. Sauerkraut.   Choose: Low-sodium canned vegetables. Low-sodium tomato sauce and paste. Frozen or fresh vegetables. Fresh and frozen fruit.  Condiments  Avoid: Canned and packaged gravies. Worcestershire sauce. Tartar sauce. Barbecue sauce. Soy sauce. Steak sauce. Ketchup. Onion, garlic, and table salt. Meat flavorings and tenderizers.   Choose: Fresh and dried herbs and spices. Low-sodium varieties of mustard and ketchup. Lemon juice. Tabasco sauce. Horseradish.  SAMPLE 1.5 GRAM SODIUM MEAL PLAN Breakfast / Sodium (mg)  1 cup low-fat milk / 143 mg   1 whole-wheat English muffin / 240 mg   1 tbs heart-healthy margarine / 153 mg   1 hard-boiled egg /   139 mg   1 small orange / 0 mg  Lunch / Sodium (mg)  1 cup raw carrots / 76 mg   2 tbs no salt added peanut butter / 5 mg   2 slices whole-wheat bread / 270 mg   1 tbs jelly / 6 mg    cup red grapes / 2 mg  Dinner / Sodium (mg)  1 cup whole-wheat pasta / 2 mg   1 cup low-sodium tomato sauce / 73 mg   3 oz lean ground beef / 57 mg   1 small side salad (1 cup raw spinach leaves,  cup cucumber,  cup yellow bell pepper) with 1 tsp olive oil and 1 tsp red wine vinegar / 25 mg  Snack /  Sodium (mg)  1 container low-fat vanilla yogurt / 107 mg   3 graham cracker squares / 127 mg  Nutrient Analysis  Calories: 1745   Protein: 75 g   Carbohydrate: 237 g   Fat: 57 g   Sodium: 1425 mg  Document Released: 06/11/2005 Document Revised: 05/31/2011 Document Reviewed: 09/12/2009 [facility] Patient Information 2012 [place], [place].  Preventive Care for Adults, Female A healthy lifestyle and preventive care can promote health and wellness. Preventive health guidelines for women include the following key practices.  A routine yearly physical is a good way to check with your caregiver about your health and preventive screening. It is a chance to share any concerns and updates on your health, and to receive a thorough exam.   Visit your dentist for a routine exam and preventive care every 6 months. Brush your teeth twice a day and floss once a day. Good oral hygiene prevents tooth decay and gum disease.   The frequency of eye exams is based on your age, health, family medical history, use of contact lenses, and other factors. Follow your caregiver's recommendations for frequency of eye exams.   Eat a healthy diet. Foods like vegetables, fruits, whole grains, low-fat dairy products, and lean protein foods contain the nutrients you need without too many calories. Decrease your intake of foods high in solid fats, added sugars, and salt. Eat the right amount of calories for you.Get information about a proper diet from your caregiver, if necessary.   Regular physical exercise is one of the most important things you can do for your health. Most adults should get at least 150 minutes of moderate-intensity exercise (any activity that increases your heart rate and causes you to sweat) each week. In addition, most adults need muscle-strengthening exercises on 2 or more days a week.   Maintain a healthy weight. The body mass index (BMI) is a screening tool to identify possible weight problems.  It provides an estimate of body fat based on height and weight. Your caregiver can help determine your BMI, and can help you achieve or maintain a healthy weight.For adults 20 years and older:   A BMI below 18.5 is considered underweight.   A BMI of 18.5 to 24.9 is normal.   A BMI of 25 to 29.9 is considered overweight.   A BMI of 30 and above is considered obese.   Maintain normal blood lipids and cholesterol levels by exercising and minimizing your intake of saturated fat. Eat a balanced diet with plenty of fruit and vegetables. Blood tests for lipids and cholesterol should begin at age 70 and be repeated every 5 years. If your lipid or cholesterol levels are high, you are over 50, or you are at high  risk for heart disease, you may need your cholesterol levels checked more frequently.Ongoing high lipid and cholesterol levels should be treated with medicines if diet and exercise are not effective.   If you smoke, find out from your caregiver how to quit. If you do not use tobacco, do not start.   If you are pregnant, do not drink alcohol. If you are breastfeeding, be very cautious about drinking alcohol. If you are not pregnant and choose to drink alcohol, do not exceed 1 drink per day. One drink is considered to be 12 ounces (355 mL) of beer, 5 ounces (148 mL) of wine, or 1.5 ounces (44 mL) of liquor.   Avoid use of street drugs. Do not share needles with anyone. Ask for help if you need support or instructions about stopping the use of drugs.   High blood pressure causes heart disease and increases the risk of stroke. Your blood pressure should be checked at least every 1 to 2 years. Ongoing high blood pressure should be treated with medicines if weight loss and exercise are not effective.   If you are 26 to 58 years old, ask your caregiver if you should take aspirin to prevent strokes.   Diabetes screening involves taking a blood sample to check your fasting blood sugar level. This should  be done once every 3 years, after age 41, if you are within normal weight and without risk factors for diabetes. Testing should be considered at a younger age or be carried out more frequently if you are overweight and have at least 1 risk factor for diabetes.   Breast cancer screening is essential preventive care for women. You should practice "breast self-awareness." This means understanding the normal appearance and feel of your breasts and may include breast self-examination. Any changes detected, no matter how small, should be reported to a caregiver. Women in their 22s and 30s should have a clinical breast exam (CBE) by a caregiver as part of a regular health exam every 1 to 3 years. After age 46, women should have a CBE every year. Starting at age 31, women should consider having a mammography (breast X-ray test) every year. Women who have a family history of breast cancer should talk to their caregiver about genetic screening. Women at a high risk of breast cancer should talk to their caregivers about having magnetic resonance imaging (MRI) and a mammography every year.   The Pap test is a screening test for cervical cancer. A Pap test can show cell changes on the cervix that might become cervical cancer if left untreated. A Pap test is a procedure in which cells are obtained and examined from the lower end of the uterus (cervix).   Women should have a Pap test starting at age 38.   Between ages 60 and 68, Pap tests should be repeated every 2 years.   Beginning at age 56, you should have a Pap test every 3 years as long as the past 3 Pap tests have been normal.   Some women have medical problems that increase the chance of getting cervical cancer. Talk to your caregiver about these problems. It is especially important to talk to your caregiver if a new problem develops soon after your last Pap test. In these cases, your caregiver may recommend more frequent screening and Pap tests.   The above  recommendations are the same for women who have or have not gotten the vaccine for human papillomavirus (HPV).   If you had  a hysterectomy for a problem that was not cancer or a condition that could lead to cancer, then you no longer need Pap tests. Even if you no longer need a Pap test, a regular exam is a good idea to make sure no other problems are starting.   If you are between ages 5 and 62, and you have had normal Pap tests going back 10 years, you no longer need Pap tests. Even if you no longer need a Pap test, a regular exam is a good idea to make sure no other problems are starting.   If you have had past treatment for cervical cancer or a condition that could lead to cancer, you need Pap tests and screening for cancer for at least 20 years after your treatment.   If Pap tests have been discontinued, risk factors (such as a new sexual partner) need to be reassessed to determine if screening should be resumed.   The HPV test is an additional test that may be used for cervical cancer screening. The HPV test looks for the virus that can cause the cell changes on the cervix. The cells collected during the Pap test can be tested for HPV. The HPV test could be used to screen women aged 72 years and older, and should be used in women of any age who have unclear Pap test results. After the age of 37, women should have HPV testing at the same frequency as a Pap test.   Colorectal cancer can be detected and often prevented. Most routine colorectal cancer screening begins at the age of 94 and continues through age 90. However, your caregiver may recommend screening at an earlier age if you have risk factors for colon cancer. On a yearly basis, your caregiver may provide home test kits to check for hidden blood in the stool. Use of a small camera at the end of a tube, to directly examine the colon (sigmoidoscopy or colonoscopy), can detect the earliest forms of colorectal cancer. Talk to your caregiver  about this at age 42, when routine screening begins. Direct examination of the colon should be repeated every 5 to 10 years through age 63, unless early forms of pre-cancerous polyps or small growths are found.   Hepatitis C blood testing is recommended for all people born from [birth year] through 1965 and any individual with known risks for hepatitis C.   Practice safe sex. Use condoms and avoid high-risk sexual practices to reduce the spread of sexually transmitted infections (STIs). STIs include gonorrhea, chlamydia, syphilis, trichomonas, herpes, HPV, and human immunodeficiency virus (HIV). Herpes, HIV, and HPV are viral illnesses that have no cure. They can result in disability, cancer, and death. Sexually active women aged 69 and younger should be checked for chlamydia. Older women with new or multiple partners should also be tested for chlamydia. Testing for other STIs is recommended if you are sexually active and at increased risk.   Osteoporosis is a disease in which the bones lose minerals and strength with aging. This can result in serious bone fractures. The risk of osteoporosis can be identified using a bone density scan. Women ages 32 and over and women at risk for fractures or osteoporosis should discuss screening with their caregivers. Ask your caregiver whether you should take a calcium supplement or vitamin D to reduce the rate of osteoporosis.   Menopause can be associated with physical symptoms and risks. Hormone replacement therapy is available to decrease symptoms and risks. You should talk to  your caregiver about whether hormone replacement therapy is right for you.   Use sunscreen with sun protection factor (SPF) of 30 or more. Apply sunscreen liberally and repeatedly throughout the day. You should seek shade when your shadow is shorter than you. Protect yourself by wearing long sleeves, pants, a wide-brimmed hat, and sunglasses year round, whenever you are outdoors.   Once a month, do  a whole body skin exam, using a mirror to look at the skin on your back. Notify your caregiver of new moles, moles that have irregular borders, moles that are larger than a pencil eraser, or moles that have changed in shape or color.   Stay current with required immunizations.   Influenza. You need a dose every fall (or winter). The composition of the flu vaccine changes each year, so being vaccinated once is not enough.   Pneumococcal polysaccharide. You need 1 to 2 doses if you smoke cigarettes or if you have certain chronic medical conditions. You need 1 dose at age 29 (or older) if you have never been vaccinated.   Tetanus, diphtheria, pertussis (Tdap, Td). Get 1 dose of Tdap vaccine if you are younger than age 25, are over 47 and have contact with an infant, are a Research scientist (physical sciences), are pregnant, or simply want to be protected from whooping cough. After that, you need a Td booster dose every 10 years. Consult your caregiver if you have not had at least 3 tetanus and diphtheria-containing shots sometime in your life or have a deep or dirty wound.   HPV. You need this vaccine if you are a woman age 81 or younger. The vaccine is given in 3 doses over 6 months.   Measles, mumps, rubella (MMR). You need at least 1 dose of MMR if you were born in 1957 or later. You may also need a second dose.   Meningococcal. If you are age 34 to 4 and a first-year college student living in a residence hall, or have one of several medical conditions, you need to get vaccinated against meningococcal disease. You may also need additional booster doses.   Zoster (shingles). If you are age 56 or older, you should get this vaccine.   Varicella (chickenpox). If you have never had chickenpox or you were vaccinated but received only 1 dose, talk to your caregiver to find out if you need this vaccine.   Hepatitis A. You need this vaccine if you have a specific risk factor for hepatitis A virus infection or you simply  wish to be protected from this disease. The vaccine is usually given as 2 doses, 6 to 18 months apart.   Hepatitis B. You need this vaccine if you have a specific risk factor for hepatitis B virus infection or you simply wish to be protected from this disease. The vaccine is given in 3 doses, usually over 6 months.  Preventive Services / Frequency Ages 57 to 11  Blood pressure check.** / Every 1 to 2 years.   Lipid and cholesterol check.** / Every 5 years beginning at age 3.   Clinical breast exam.** / Every 3 years for women in their 27s and 30s.   Pap test.** / Every 2 years from ages 39 through 32. Every 3 years starting at age 65 through age 101 or 29 with a history of 3 consecutive normal Pap tests.   HPV screening.** / Every 3 years from ages 13 through ages 58 to 23 with a history of 3 consecutive normal Pap  tests.   Hepatitis C blood test.** / For any individual with known risks for hepatitis C.   Skin self-exam. / Monthly.   Influenza immunization.** / Every year.   Pneumococcal polysaccharide immunization.** / 1 to 2 doses if you smoke cigarettes or if you have certain chronic medical conditions.   Tetanus, diphtheria, pertussis (Tdap, Td) immunization. / A one-time dose of Tdap vaccine. After that, you need a Td booster dose every 10 years.   HPV immunization. / 3 doses over 6 months, if you are 56 and younger.   Measles, mumps, rubella (MMR) immunization. / You need at least 1 dose of MMR if you were born in 1957 or later. You may also need a second dose.   Meningococcal immunization. / 1 dose if you are age 74 to 38 and a first-year college student living in a residence hall, or have one of several medical conditions, you need to get vaccinated against meningococcal disease. You may also need additional booster doses.   Varicella immunization.** / Consult your caregiver.   Hepatitis A immunization.** / Consult your caregiver. 2 doses, 6 to 18 months apart.   Hepatitis  B immunization.** / Consult your caregiver. 3 doses usually over 6 months.  Ages 94 to 31  Blood pressure check.** / Every 1 to 2 years.   Lipid and cholesterol check.** / Every 5 years beginning at age 62.   Clinical breast exam.** / Every year after age 5.   Mammogram.** / Every year beginning at age 10 and continuing for as long as you are in good health. Consult with your caregiver.   Pap test.** / Every 3 years starting at age 54 through age 1 or 55 with a history of 3 consecutive normal Pap tests.   HPV screening.** / Every 3 years from ages 23 through ages 88 to 54 with a history of 3 consecutive normal Pap tests.   Fecal occult blood test (FOBT) of stool. / Every year beginning at age 8 and continuing until age 20. You may not need to do this test if you get a colonoscopy every 10 years.   Flexible sigmoidoscopy or colonoscopy.** / Every 5 years for a flexible sigmoidoscopy or every 10 years for a colonoscopy beginning at age 72 and continuing until age 66.   Hepatitis C blood test.** / For all people born from [birth year] through 1967 and any individual with known risks for hepatitis C.   Skin self-exam. / Monthly.   Influenza immunization.** / Every year.   Pneumococcal polysaccharide immunization.** / 1 to 2 doses if you smoke cigarettes or if you have certain chronic medical conditions.   Tetanus, diphtheria, pertussis (Tdap, Td) immunization.** / A one-time dose of Tdap vaccine. After that, you need a Td booster dose every 10 years.   Measles, mumps, rubella (MMR) immunization. / You need at least 1 dose of MMR if you were born in 1957 or later. You may also need a second dose.   Varicella immunization.** / Consult your caregiver.   Meningococcal immunization.** / Consult your caregiver.   Hepatitis A immunization.** / Consult your caregiver. 2 doses, 6 to 18 months apart.   Hepatitis B immunization.** / Consult your caregiver. 3 doses, usually over 6 months.  Ages 53  and over  Blood pressure check.** / Every 1 to 2 years.   Lipid and cholesterol check.** / Every 5 years beginning at age 55.   Clinical breast exam.** / Every year after age 78.  Mammogram.** / Every year beginning at age 59 and continuing for as long as you are in good health. Consult with your caregiver.   Pap test.** / Every 3 years starting at age 78 through age 97 or 63 with a 3 consecutive normal Pap tests. Testing can be stopped between 65 and 70 with 3 consecutive normal Pap tests and no abnormal Pap or HPV tests in the past 10 years.   HPV screening.** / Every 3 years from ages 26 through ages 39 or 80 with a history of 3 consecutive normal Pap tests. Testing can be stopped between 65 and 70 with 3 consecutive normal Pap tests and no abnormal Pap or HPV tests in the past 10 years.   Fecal occult blood test (FOBT) of stool. / Every year beginning at age 43 and continuing until age 62. You may not need to do this test if you get a colonoscopy every 10 years.   Flexible sigmoidoscopy or colonoscopy.** / Every 5 years for a flexible sigmoidoscopy or every 10 years for a colonoscopy beginning at age 5 and continuing until age 9.   Hepatitis C blood test.** / For all people born from [birth year] through 1967 and any individual with known risks for hepatitis C.   Osteoporosis screening.** / A one-time screening for women ages 35 and over and women at risk for fractures or osteoporosis.   Skin self-exam. / Monthly.   Influenza immunization.** / Every year.   Pneumococcal polysaccharide immunization.** / 1 dose at age 80 (or older) if you have never been vaccinated.   Tetanus, diphtheria, pertussis (Tdap, Td) immunization. / A one-time dose of Tdap vaccine if you are over 65 and have contact with an infant, are a Research scientist (physical sciences), or simply want to be protected from whooping cough. After that, you need a Td booster dose every 10 years.   Varicella immunization.** / Consult your caregiver.    Meningococcal immunization.** / Consult your caregiver.   Hepatitis A immunization.** / Consult your caregiver. 2 doses, 6 to 18 months apart.   Hepatitis B immunization.** / Check with your caregiver. 3 doses, usually over 6 months.  ** Family history and personal history of risk and conditions may change your caregiver's recommendations. Document Released: 08/07/2001 Document Revised: 05/31/2011 Document Reviewed: 11/06/2010 [facility] Patient Information 2012 [place], [place].

## 2011-09-24 NOTE — Telephone Encounter (Signed)
Please advise      KP 

## 2011-09-24 NOTE — Assessment & Plan Note (Signed)
con't meds stable 

## 2011-09-24 NOTE — Progress Notes (Signed)
Subjective:     Beth Shepard is a 56 y.o. female and is here for a comprehensive physical exam. 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  History   Social History  . Marital Status: Widowed    Spouse Name: N/A    Number of Children: N/A  . Years of Education: N/A   Occupational History  . Not on file.   Social History Main Topics  . Smoking status: Never Smoker   . Smokeless tobacco: Never Used  . Alcohol Use: No  . Drug Use: No  . Sexually Active: Not Currently -- Female partner(s)   Other Topics Concern  . Not on file   Social History Narrative   Limited exercise per cardiology   Health Maintenance  Topic Date Due  . Ophthalmology Exam  07/19/1963  . Tetanus/tdap  07/18/1972  . Mammogram  07/19/2003  . Hemoglobin A1c  03/01/2011  . Foot Exam  08/29/2011  . Urine Microalbumin  08/29/2011  . Influenza Vaccine  03/25/2012  . Pap Smear  09/24/2014  . Colonoscopy  06/07/2015    The following portions of the patient's history were reviewed and updated as appropriate: allergies, current medications, past family history, past medical history, past social history, past surgical history and problem list.  Review of Systems Review of Systems  Constitutional: Negative for activity change, appetite change and fatigue.  HENT: Negative for hearing loss, congestion, tinnitus and ear discharge.   Eyes: Negative for visual disturbance (see opth--due--she can't afford it). Respiratory: Negative for cough, chest tightness and shortness of breath.   Cardiovascular: Negative for chest pain, palpitations and leg swelling.  Gastrointestinal: Negative for abdominal pain, diarrhea, constipation and abdominal distention.  Genitourinary: Negative for urgency, frequency, decreased urine volume and difficulty urinating.  Musculoskeletal: Negative for back pain, arthralgias and gait problem.  Skin: Negative for color change, pallor and  rash.  Neurological: Negative for dizziness, light-headedness, numbness and headaches.  Hematological: Negative for adenopathy. Does not bruise/bleed easily.  Psychiatric/Behavioral: Negative for suicidal ideas, confusion, sleep disturbance, self-injury, dysphoric mood, decreased concentration and agitation.       Objective:    BP 100/60  Pulse 80  Temp(Src) 98.9 F (37.2 C) (Oral)  Ht 5\' 3"  (1.6 m)  Wt 181 lb 9.6 oz (82.373 kg)  BMI 32.17 kg/m2  SpO2 97% General appearance: alert, cooperative, appears stated age and no distress Head: Normocephalic, without obvious abnormality, atraumatic Eyes: conjunctivae/corneas clear. PERRL, EOM's intact. Fundi benign. Ears: normal TM's and external ear canals both ears Nose: Nares normal. Septum midline. Mucosa normal. No drainage or sinus tenderness. Throat: lips, mucosa, and tongue normal; teeth and gums normal Neck: no adenopathy, no carotid bruit, no JVD, supple, symmetrical, trachea midline and thyroid not enlarged, symmetric, no tenderness/mass/nodules Back: symmetric, no curvature. ROM normal. No CVA tenderness. Lungs: clear to auscultation bilaterally Breasts: positive findings: firm and mobile nodule located bilaterally lower outer quadrant Heart: S1, S2 normal Abdomen: soft, non-tender; bowel sounds normal; no masses,  no organomegaly Pelvic: not indicated; status post hysterectomy, negative ROS Extremities: extremities normal, atraumatic, no cyanosis or edema Pulses: 2+ and symmetric Skin: Skin color, texture, turgor normal. No rashes or lesions Lymph nodes: Cervical, supraclavicular, and axillary nodes normal. Neurologic: Alert and oriented X 3, normal strength and tone. Normal symmetric reflexes. Normal coordination and gait no depression/ anxiety    Assessment:    Healthy female exam.  Plan:  Check labs ghm-- check mammo/ bmd               Pt needs opth --but she can not afford right now   See After Visit  Summary for Counseling Recommendations

## 2011-09-24 NOTE — Progress Notes (Signed)
Addended by: Silvio Pate D on: 09/24/2011 03:50 PM   Modules accepted: Orders

## 2011-09-24 NOTE — Telephone Encounter (Signed)
Patient wants to know if she can come in earlier this morning for Labs? Also Patient want to know if instead of doing labs since she was recently hospitalized is there no way we can use their blood work instead of doing blood work again?  Also patient states the forms that we are suppose to have here must be filled out today & sent in via fax or she will have to pay an additional $600.00 for her insurance & she doesn't want to do that. Please review & advise on lab work  Thanks Patient ph# [phone]

## 2011-09-24 NOTE — Assessment & Plan Note (Signed)
Refill tramadol

## 2011-09-24 NOTE — Assessment & Plan Note (Signed)
On protonix F/u GI--- pt states she was told in hospital she had a stricture

## 2011-09-24 NOTE — Assessment & Plan Note (Signed)
Check labs con't meds 

## 2011-09-24 NOTE — Telephone Encounter (Signed)
Called patient she will come in 1:45pm to get blood work drawn-smc

## 2011-09-24 NOTE — Telephone Encounter (Signed)
Order is already in computer for labs---- hospital doesn't normally do everything we do for cpe

## 2011-09-26 LAB — URINE CULTURE: Colony Count: 15000

## 2011-11-30 ENCOUNTER — Other Ambulatory Visit: Payer: Self-pay | Admitting: Family Medicine

## 2011-11-30 NOTE — Telephone Encounter (Signed)
Last seen and filled 09/24/11 # 60 . Please advise    KP

## 2011-12-12 ENCOUNTER — Telehealth: Payer: Self-pay | Admitting: Family Medicine

## 2011-12-12 DIAGNOSIS — G4700 Insomnia, unspecified: Secondary | ICD-10-CM

## 2011-12-12 MED ORDER — ALPRAZOLAM 0.5 MG PO TABS: 0.5000 mg | ORAL_TABLET | Freq: Every evening | ORAL | Status: DC | PRN

## 2011-12-12 NOTE — Telephone Encounter (Signed)
Please advise      KP 

## 2011-12-12 NOTE — Telephone Encounter (Signed)
Patient called today and states she has 2 issues: 1. She would like something else for her arthritis pain. She states the tramadol does not work and she can't take anything over the counter except Tylenol. Her cardiologist has told her not to take anything else, but Tylenol does not help her pain.  2. She states she received a 30 day supply for her xanax and her insurance will only pay for a 90 day supply. She states she usually gets the 90 day supply, but didn't this time and wants to know if there is a reason.  Best # [phone]. Per patient, if no answer you may leave a message.

## 2011-12-12 NOTE — Telephone Encounter (Signed)
Msg left on the VM and RX has been faxed      KP

## 2011-12-12 NOTE — Telephone Encounter (Signed)
Change xanax to #90 with no refills The next step for arthritis pain is to rx narcotic--- I'm not sure she wants to go that strong

## 2011-12-13 NOTE — Telephone Encounter (Signed)
Call from patient and she is feeling bad, when she raises her arms she feels dizzy, she was told by Cardiologist this was not a heart problem and she will need to see PCP. I offered and apt today and she stated she was at work and could not make it, we shceduled for tomorrow and she agreed to call back if she felt worst during the day to be seen today.      KP

## 2011-12-14 ENCOUNTER — Ambulatory Visit (INDEPENDENT_AMBULATORY_CARE_PROVIDER_SITE_OTHER): Payer: BC Managed Care – PPO | Admitting: Family Medicine

## 2011-12-14 ENCOUNTER — Encounter: Payer: Self-pay | Admitting: Family Medicine

## 2011-12-14 VITALS — BP 100/66 | HR 70 | Temp 99.5°F | Wt 177.2 lb

## 2011-12-14 DIAGNOSIS — F419 Anxiety disorder, unspecified: Secondary | ICD-10-CM

## 2011-12-14 DIAGNOSIS — G4700 Insomnia, unspecified: Secondary | ICD-10-CM

## 2011-12-14 DIAGNOSIS — N390 Urinary tract infection, site not specified: Secondary | ICD-10-CM

## 2011-12-14 DIAGNOSIS — R112 Nausea with vomiting, unspecified: Secondary | ICD-10-CM

## 2011-12-14 DIAGNOSIS — R0602 Shortness of breath: Secondary | ICD-10-CM

## 2011-12-14 LAB — POCT URINALYSIS DIPSTICK
Glucose, UA: NEGATIVE
Ketones, UA: NEGATIVE
Protein, UA: NEGATIVE
Spec Grav, UA: >=1.03
Urobilinogen, UA: 0.2

## 2011-12-14 LAB — CBC WITH DIFFERENTIAL/PLATELET
Eosinophils Relative: 1.1 % (ref 0.0–5.0)
HCT: 35.9 % — ABNORMAL LOW (ref 36.0–46.0)
Hemoglobin: 11.9 g/dL — ABNORMAL LOW (ref 12.0–15.0)
Lymphs Abs: 1.6 10*3/uL (ref 0.7–4.0)
Monocytes Relative: 6.1 % (ref 3.0–12.0)
Neutro Abs: 5.3 10*3/uL (ref 1.4–7.7)
WBC: 7.5 10*3/uL (ref 4.5–10.5)

## 2011-12-14 LAB — BASIC METABOLIC PANEL
GFR: 78.19 mL/min (ref >60.00)
Potassium: 4 mEq/L (ref 3.5–5.1)
Sodium: 140 mEq/L (ref 135–145)

## 2011-12-14 LAB — HEPATIC FUNCTION PANEL
ALT: 70 U/L — ABNORMAL HIGH (ref 0–35)
Alkaline Phosphatase: 77 U/L (ref 39–117)
Bilirubin, Direct: 0 mg/dL (ref 0.0–0.3)
Total Bilirubin: 0.5 mg/dL (ref 0.3–1.2)
Total Protein: 7.3 g/dL (ref 6.0–8.3)

## 2011-12-14 LAB — TSH: TSH: 0.46 u[IU]/mL (ref 0.35–5.50)

## 2011-12-14 MED ORDER — ALPRAZOLAM 0.25 MG PO TABS: 0.2500 mg | ORAL_TABLET | Freq: Two times a day (BID) | ORAL | Status: AC | PRN

## 2011-12-14 NOTE — Progress Notes (Signed)
  Subjective:     Beth Shepard is a 56 y.o. female who presents for evaluation of diarrhea 10 times per day, nausea and vomiting.  Symptoms have been present for 4 days. Patient denies blood in stool, constipation, dark urine, dysuria, heartburn, hematemesis, hematuria and melena. Patient's oral intake has been decreased for liquids and decreased for solids. Patient's urine output has been adequate. Other contacts with similar symptoms include: coworkes. Patient denies recent travel history. Patient has not had recent ingestion of possible contaminated food, toxic plants, or inappropriate medications/poisons.  EKG--  No change from previous  The following portions of the patient's history were reviewed and updated as appropriate: allergies, current medications, past family history, past medical history, past social history, past surgical history and problem list.  Review of Systems Pertinent items are noted in HPI.    Objective:     BP 100/66  Pulse 70  Temp 99.5 F (37.5 C) (Oral)  Wt 177 lb 3.2 oz (80.377 kg)  SpO2 96% General appearance: alert, cooperative, appears stated age and no distress Throat: lips, mucosa, and tongue normal; teeth and gums normal Neck: no adenopathy, no carotid bruit, no JVD, supple, symmetrical, trachea midline and thyroid not enlarged, symmetric, no tenderness/mass/nodules Lungs: clear to auscultation bilaterally Heart: S1, S2 normal Abdomen: abnormal findings:  mild tenderness in the RUQ Skin: Skin color, texture, turgor normal. No rashes or lesions    Assessment:    Acute Gastroenteritis    Plan:    1. Discussed oral rehydration, reintroduction of solid foods, signs of dehydration. 2. Return or go to emergency department if worsening symptoms, blood or bile, signs of dehydration, diarrhea lasting longer than 5 days or any new concerns. 3. Follow up in a few days or sooner as needed.  4.  Check labs

## 2011-12-14 NOTE — Patient Instructions (Signed)
Viral Gastroenteritis Viral gastroenteritis is also known as stomach flu. This condition affects the stomach and intestinal tract. It can cause sudden diarrhea and vomiting. The illness typically lasts 3 to 8 days. Most people develop an immune response that eventually gets rid of the virus. While this natural response develops, the virus can make you quite ill. CAUSES  Many different viruses can cause gastroenteritis, such as rotavirus or noroviruses. You can catch one of these viruses by consuming contaminated food or water. You may also catch a virus by sharing utensils or other personal items with an infected person or by touching a contaminated surface. SYMPTOMS  The most common symptoms are diarrhea and vomiting. These problems can cause a severe loss of body fluids (dehydration) and a body salt (electrolyte) imbalance. Other symptoms may include:  Fever.   Headache.   Fatigue.   Abdominal pain.  DIAGNOSIS  Your caregiver can usually diagnose viral gastroenteritis based on your symptoms and a physical exam. A stool sample may also be taken to test for the presence of viruses or other infections. TREATMENT  This illness typically goes away on its own. Treatments are aimed at rehydration. The most serious cases of viral gastroenteritis involve vomiting so severely that you are not able to keep fluids down. In these cases, fluids must be given through an intravenous line (IV). HOME CARE INSTRUCTIONS   Drink enough fluids to keep your urine clear or pale yellow. Drink small amounts of fluids frequently and increase the amounts as tolerated.   Ask your caregiver for specific rehydration instructions.   Avoid:   Foods high in sugar.   Alcohol.   Carbonated drinks.   Tobacco.   Juice.   Caffeine drinks.   Extremely hot or cold fluids.   Fatty, greasy foods.   Too much intake of anything at one time.   Dairy products until 24 to 48 hours after diarrhea stops.   You may  consume probiotics. Probiotics are active cultures of beneficial bacteria. They may lessen the amount and number of diarrheal stools in adults. Probiotics can be found in yogurt with active cultures and in supplements.   Wash your hands well to avoid spreading the virus.   Only take over-the-counter or prescription medicines for pain, discomfort, or fever as directed by your caregiver. Do not give aspirin to children. Antidiarrheal medicines are not recommended.   Ask your caregiver if you should continue to take your regular prescribed and over-the-counter medicines.   Keep all follow-up appointments as directed by your caregiver.  SEEK IMMEDIATE MEDICAL CARE IF:   You are unable to keep fluids down.   You do not urinate at least once every 6 to 8 hours.   You develop shortness of breath.   You notice blood in your stool or vomit. This may look like coffee grounds.   You have abdominal pain that increases or is concentrated in one small area (localized).   You have persistent vomiting or diarrhea.   You have a fever.   The patient is a child younger than 3 months, and he or she has a fever.   The patient is a child older than 3 months, and he or she has a fever and persistent symptoms.   The patient is a child older than 3 months, and he or she has a fever and symptoms suddenly get worse.   The patient is a baby, and he or she has no tears when crying.  MAKE SURE YOU:     Understand these instructions.   Will watch your condition.   Will get help right away if you are not doing well or get worse.  Document Released: 06/11/2005 Document Revised: 05/31/2011 Document Reviewed: 03/28/2011 [employer] Patient Information 2012 [employer]. 

## 2011-12-16 LAB — URINE CULTURE: Colony Count: 45000

## 2011-12-24 ENCOUNTER — Telehealth: Payer: Self-pay | Admitting: Family Medicine

## 2011-12-24 DIAGNOSIS — K219 Gastro-esophageal reflux disease without esophagitis: Secondary | ICD-10-CM

## 2011-12-24 MED ORDER — PANTOPRAZOLE SODIUM 20 MG PO TBEC: 20.0000 mg | DELAYED_RELEASE_TABLET | Freq: Every day | ORAL | Status: DC

## 2011-12-24 NOTE — Telephone Encounter (Signed)
Refill: Pantoprazole sod dr 20mg  tab. 90 day supply

## 2011-12-29 ENCOUNTER — Other Ambulatory Visit: Payer: Self-pay | Admitting: Family Medicine

## 2012-01-01 ENCOUNTER — Other Ambulatory Visit (INDEPENDENT_AMBULATORY_CARE_PROVIDER_SITE_OTHER): Payer: BC Managed Care – PPO

## 2012-01-01 DIAGNOSIS — R7401 Elevation of levels of liver transaminase levels: Secondary | ICD-10-CM

## 2012-01-01 LAB — HEPATIC FUNCTION PANEL
Bilirubin, Direct: 0 mg/dL (ref 0.0–0.3)
Total Bilirubin: 0.6 mg/dL (ref 0.3–1.2)
Total Protein: 7.4 g/dL (ref 6.0–8.3)

## 2012-01-01 NOTE — Progress Notes (Signed)
Labs only

## 2012-01-02 LAB — HEPATITIS PANEL, ACUTE
HCV Ab: NEGATIVE
Hep A IgM: NEGATIVE
Hep B C IgM: NEGATIVE
Hepatitis B Surface Ag: NEGATIVE

## 2012-02-11 ENCOUNTER — Telehealth: Payer: Self-pay | Admitting: Family Medicine

## 2012-02-11 DIAGNOSIS — E785 Hyperlipidemia, unspecified: Secondary | ICD-10-CM

## 2012-02-11 MED ORDER — SIMVASTATIN 40 MG PO TABS: 40.0000 mg | ORAL_TABLET | Freq: Every evening | ORAL | Status: DC

## 2012-02-11 NOTE — Telephone Encounter (Signed)
Refill: Simvastatin 40mg  tablet. Take 1 tablet every evening. [employer] says she needs 90 day rx*

## 2012-02-14 ENCOUNTER — Telehealth: Payer: Self-pay | Admitting: Family Medicine

## 2012-02-14 NOTE — Telephone Encounter (Signed)
Pt states the tramadol does not work for her pain. She would like Dr. Laury Axon to call her in lidocaine patches 700mg  to [facility] in [place]. She states a co-worker of hers uses them and she has found some relief by using them as well.

## 2012-02-15 NOTE — Telephone Encounter (Signed)
Pt requesting a call back from Kim to she can get more information as to why rx for this patch requires an office visit. She states her cardiologist told her that he couldn't prescribe it for her, but that her PCP could write it. Patient does not want to come in for an office visit due to her financial situation. Pt can be reached at work # [phone].

## 2012-02-15 NOTE — Telephone Encounter (Signed)
She said she will have figure out something. She said she has too many medical bills right now to come in once she figures something out she will call us back.    KP

## 2012-02-15 NOTE — Telephone Encounter (Signed)
I need more info from her and it is poor practice to write pain meds esp new ones without seeing th pt.

## 2012-02-15 NOTE — Telephone Encounter (Signed)
Pt would need ov

## 2012-02-15 NOTE — Telephone Encounter (Signed)
Please offer this patient an apt per Dr.Lowne.      KP 

## 2012-02-22 ENCOUNTER — Other Ambulatory Visit: Payer: Self-pay

## 2012-02-22 DIAGNOSIS — E785 Hyperlipidemia, unspecified: Secondary | ICD-10-CM

## 2012-02-22 MED ORDER — SIMVASTATIN 40 MG PO TABS: 40.0000 mg | ORAL_TABLET | Freq: Every evening | ORAL | Status: DC

## 2012-03-19 ENCOUNTER — Encounter: Payer: Self-pay | Admitting: Gastroenterology

## 2012-04-15 ENCOUNTER — Encounter: Payer: Self-pay | Admitting: Family Medicine

## 2012-04-15 ENCOUNTER — Ambulatory Visit (INDEPENDENT_AMBULATORY_CARE_PROVIDER_SITE_OTHER): Payer: BC Managed Care – PPO | Admitting: Family Medicine

## 2012-04-15 VITALS — BP 105/70 | HR 73 | Temp 98.5°F | Ht 62.75 in | Wt 179.8 lb

## 2012-04-15 DIAGNOSIS — J209 Acute bronchitis, unspecified: Secondary | ICD-10-CM

## 2012-04-15 MED ORDER — AZITHROMYCIN 250 MG PO TABS: ORAL_TABLET | ORAL | Status: DC

## 2012-04-15 NOTE — Progress Notes (Signed)
  Subjective:    Patient ID: Beth Shepard, female    DOB: 1956-02-17, 56 y.o.   MRN: [id number]  HPI URI- developed scratchy throat last week w/ dry cough.  'has spiraled every since'.  Fever over the weekend.  + laryngitis.  Cough was productive of 'nasty and green' this AM.  Has sensation of something thick and 'choking' in the throat.  Cough is worse in the AM.  + bloody nasal drainage.  Denies facial pain/pressure.  + HA.  L eye burning.  Intermittent ear pain.  + fatigue.  No known sick contacts.  Has not been using inhalers.  Using coricidin w/out relief, mucinex.   Review of Systems For ROS see HPI     Objective:   Physical Exam  Constitutional: She appears well-developed and well-nourished. No distress.  HENT:  Head: Normocephalic and atraumatic.       TMs normal bilaterally Mild nasal congestion Throat w/out erythema, edema, or exudate No TTP over sinuses  Eyes: Conjunctivae normal and EOM are normal. Pupils are equal, round, and reactive to light.  Neck: Normal range of motion. Neck supple.  Cardiovascular: Normal rate and regular rhythm.   Pulmonary/Chest: Effort normal and breath sounds normal. No respiratory distress. She has no wheezes.       + hacking cough  Lymphadenopathy:    She has no cervical adenopathy.          Assessment & Plan:

## 2012-04-15 NOTE — Assessment & Plan Note (Signed)
New to provider.  No evidence of PNA or sinusitis.  Given hx of underlying lung dz will start abx.  Reviewed supportive care and red flags that should prompt return.  Pt expressed understanding and is in agreement w/ plan.

## 2012-04-15 NOTE — Patient Instructions (Addendum)
This is a bronchitis Start the Zpack as directed Continue the mucinex to thin your congestion Delsym as needed for cough Drink plenty of fluids REST! Hang in there!

## 2012-04-16 ENCOUNTER — Telehealth: Payer: Self-pay | Admitting: *Deleted

## 2012-04-16 NOTE — Telephone Encounter (Signed)
Pt is to be seen by cards office tomorrow and pt advised that she was told to STOP taking her coricidin and the cards office wanted the notes from the office visit that advised her to stop taking the medication and why, advised the notation is NOT in the office notes per the pt was leaving office and came back to ask about taking the coricdin, pt was then given verbal instructions to STOP taking while she is on the Zpack and muccinex per this combo may dry out her sinus too much and she can resume the coricidin once she stops taking the zpack and muccinex per the following discharge notes:  This is a bronchitis  Start the Zpack as directed  Continue the mucinex to thin your congestion  Delsym as needed for cough  Drink plenty of fluids  REST!  Hang in there!  Cards rep angela understood and noted the pt told a different story, they will reiterate the instructions once again. MD Beverely Low made aware verbally

## 2012-05-23 ENCOUNTER — Other Ambulatory Visit: Payer: Self-pay | Admitting: Family Medicine

## 2012-05-23 NOTE — Telephone Encounter (Signed)
Last seen 12/14/11 and filled  Xanax 12/14/11 #90, Tramadol 12/29/11 #60. Please advise     KP

## 2012-05-23 NOTE — Telephone Encounter (Signed)
Refill x1-- due ov in dec

## 2012-05-24 ENCOUNTER — Other Ambulatory Visit: Payer: Self-pay | Admitting: Family Medicine

## 2012-05-26 ENCOUNTER — Other Ambulatory Visit: Payer: Self-pay | Admitting: Family Medicine

## 2012-05-26 NOTE — Telephone Encounter (Signed)
OK #30 

## 2012-05-26 NOTE — Telephone Encounter (Signed)
Last seen 12/14/11 and filled 05/23/12 # 90. Please advise     KP

## 2012-05-26 NOTE — Telephone Encounter (Signed)
Rx sent 05/24/12--- will refax     KP

## 2012-07-30 ENCOUNTER — Other Ambulatory Visit: Payer: Self-pay | Admitting: Family Medicine

## 2012-07-30 NOTE — Telephone Encounter (Signed)
Last seen 12/14/11 and filled 05/24/12. Please advise      KP

## 2012-08-22 ENCOUNTER — Telehealth: Payer: Self-pay | Admitting: Family Medicine

## 2012-08-22 ENCOUNTER — Other Ambulatory Visit: Payer: Self-pay | Admitting: Family Medicine

## 2012-08-22 DIAGNOSIS — I10 Essential (primary) hypertension: Secondary | ICD-10-CM

## 2012-08-22 MED ORDER — LISINOPRIL 20 MG PO TABS: 20.0000 mg | ORAL_TABLET | Freq: Every day | ORAL | Status: DC

## 2012-08-22 NOTE — Telephone Encounter (Signed)
requesting 90-day supply refil of  Lisinopril (Tab) 20 MG Take 1 tablet (20 mg total) by mouth daily no last fill date, last wrt 3.8.13 #90 x 3-refills

## 2012-10-06 ENCOUNTER — Telehealth: Payer: Self-pay | Admitting: Family Medicine

## 2012-10-06 DIAGNOSIS — E119 Type 2 diabetes mellitus without complications: Secondary | ICD-10-CM

## 2012-10-06 MED ORDER — GLIMEPIRIDE 4 MG PO TABS: ORAL_TABLET | ORAL | Status: DC

## 2012-10-06 NOTE — Telephone Encounter (Signed)
Refill: Glimepiride 4 mg tablet. Take 1 tablet by mouth every day before breakfast. Qty 90. Last fill 06-13-12

## 2012-11-12 ENCOUNTER — Encounter: Payer: Self-pay | Admitting: Lab

## 2012-11-13 ENCOUNTER — Ambulatory Visit (INDEPENDENT_AMBULATORY_CARE_PROVIDER_SITE_OTHER): Payer: BC Managed Care – PPO | Admitting: Family Medicine

## 2012-11-13 ENCOUNTER — Encounter: Payer: Self-pay | Admitting: Family Medicine

## 2012-11-13 VITALS — BP 108/60 | HR 77 | Temp 98.5°F | Ht 63.0 in | Wt 189.4 lb

## 2012-11-13 DIAGNOSIS — M1990 Unspecified osteoarthritis, unspecified site: Secondary | ICD-10-CM

## 2012-11-13 DIAGNOSIS — E785 Hyperlipidemia, unspecified: Secondary | ICD-10-CM

## 2012-11-13 DIAGNOSIS — G4700 Insomnia, unspecified: Secondary | ICD-10-CM

## 2012-11-13 DIAGNOSIS — Z0000 Encounter for general adult medical examination without abnormal findings: Secondary | ICD-10-CM

## 2012-11-13 DIAGNOSIS — Z1231 Encounter for screening mammogram for malignant neoplasm of breast: Secondary | ICD-10-CM

## 2012-11-13 DIAGNOSIS — E2839 Other primary ovarian failure: Secondary | ICD-10-CM

## 2012-11-13 DIAGNOSIS — Z1239 Encounter for other screening for malignant neoplasm of breast: Secondary | ICD-10-CM

## 2012-11-13 DIAGNOSIS — K219 Gastro-esophageal reflux disease without esophagitis: Secondary | ICD-10-CM

## 2012-11-13 DIAGNOSIS — E119 Type 2 diabetes mellitus without complications: Secondary | ICD-10-CM

## 2012-11-13 DIAGNOSIS — I10 Essential (primary) hypertension: Secondary | ICD-10-CM

## 2012-11-13 DIAGNOSIS — Z23 Encounter for immunization: Secondary | ICD-10-CM

## 2012-11-13 DIAGNOSIS — M2550 Pain in unspecified joint: Secondary | ICD-10-CM

## 2012-11-13 LAB — POCT URINALYSIS DIPSTICK
Bilirubin, UA: NEGATIVE
Blood, UA: NEGATIVE
Glucose, UA: NEGATIVE
Nitrite, UA: NEGATIVE
Spec Grav, UA: 1.01
Urobilinogen, UA: 0.2
pH, UA: 6.5

## 2012-11-13 LAB — CBC WITH DIFFERENTIAL/PLATELET
Basophils Relative: 0.4 % (ref 0.0–3.0)
Eosinophils Relative: 1.6 % (ref 0.0–5.0)
HCT: 31.5 % — ABNORMAL LOW (ref 36.0–46.0)
Hemoglobin: 10.9 g/dL — ABNORMAL LOW (ref 12.0–15.0)
Lymphs Abs: 1.7 10*3/uL (ref 0.7–4.0)
MCV: 85.1 fl (ref 78.0–100.0)
Monocytes Absolute: 0.6 10*3/uL (ref 0.1–1.0)
Neutro Abs: 7.4 10*3/uL (ref 1.4–7.7)
Platelets: 264 10*3/uL (ref 150.0–400.0)
WBC: 9.9 10*3/uL (ref 4.5–10.5)

## 2012-11-13 LAB — LIPID PANEL
Cholesterol: 131 mg/dL (ref 0–200)
LDL Cholesterol: 64 mg/dL (ref 0–99)
VLDL: 26 mg/dL (ref 0.0–40.0)

## 2012-11-13 LAB — HEPATIC FUNCTION PANEL
ALT: 12 U/L (ref 0–35)
AST: 17 U/L (ref 0–37)
Albumin: 4.1 g/dL (ref 3.5–5.2)
Total Protein: 7.3 g/dL (ref 6.0–8.3)

## 2012-11-13 LAB — BASIC METABOLIC PANEL
BUN: 21 mg/dL (ref 6–23)
Chloride: 106 mEq/L (ref 96–112)
Potassium: 4.6 mEq/L (ref 3.5–5.1)

## 2012-11-13 LAB — MICROALBUMIN / CREATININE URINE RATIO
Creatinine,U: 88.9 mg/dL
Microalb Creat Ratio: 0.4 mg/g (ref 0.0–30.0)

## 2012-11-13 LAB — HEMOGLOBIN A1C: Hgb A1c MFr Bld: 6.1 % (ref 4.6–6.5)

## 2012-11-13 MED ORDER — TRAMADOL HCL 50 MG PO TABS: ORAL_TABLET | ORAL | Status: DC

## 2012-11-13 MED ORDER — LIDOCAINE 5 % EX PTCH: 1.0000 | MEDICATED_PATCH | CUTANEOUS | Status: DC

## 2012-11-13 MED ORDER — PANTOPRAZOLE SODIUM 20 MG PO TBEC: 20.0000 mg | DELAYED_RELEASE_TABLET | Freq: Every day | ORAL | Status: DC

## 2012-11-13 MED ORDER — SPIRONOLACTONE 25 MG PO TABS: 25.0000 mg | ORAL_TABLET | ORAL | Status: DC

## 2012-11-13 MED ORDER — FUROSEMIDE 20 MG PO TABS: 20.0000 mg | ORAL_TABLET | Freq: Every day | ORAL | Status: DC

## 2012-11-13 MED ORDER — LISINOPRIL 20 MG PO TABS: 20.0000 mg | ORAL_TABLET | Freq: Every day | ORAL | Status: DC

## 2012-11-13 MED ORDER — CARVEDILOL 6.25 MG PO TABS: 12.5000 mg | ORAL_TABLET | Freq: Two times a day (BID) | ORAL | Status: DC

## 2012-11-13 MED ORDER — ALPRAZOLAM 0.25 MG PO TABS: 0.2500 mg | ORAL_TABLET | Freq: Every evening | ORAL | Status: DC | PRN

## 2012-11-13 MED ORDER — GLIMEPIRIDE 2 MG PO TABS: 2.0000 mg | ORAL_TABLET | Freq: Every day | ORAL | Status: DC

## 2012-11-13 NOTE — Progress Notes (Signed)
Subjective:     Beth Shepard is a 57 y.o. female and is here for a comprehensive physical exam. The patient reports problems - back and hip pain.  History   Social History  . Marital Status: Widowed    Spouse Name: N/A    Number of Children: N/A  . Years of Education: N/A   Occupational History  . Not on file.   Social History Main Topics  . Smoking status: Never Smoker   . Smokeless tobacco: Never Used  . Alcohol Use: No  . Drug Use: No  . Sexually Active: Not Currently -- Female partner(s)   Other Topics Concern  . Not on file   Social History Narrative   Limited exercise per cardiology   Health Maintenance  Topic Date Due  . Pneumococcal Polysaccharide Vaccine (#1) 07/19/1955  . Ophthalmology Exam  07/19/1963  . Tetanus/tdap  07/18/1972  . Mammogram  07/19/2003  . Foot Exam  09/05/2012  . Urine Microalbumin  09/23/2012  . Influenza Vaccine  02/23/2013  . Pap Smear  09/24/2014  . Colonoscopy  06/07/2015    The following portions of the patient's history were reviewed and updated as appropriate:  She  has a past medical history of COPD (chronic obstructive pulmonary disease); Diabetes mellitus; GERD (gastroesophageal reflux disease); Hyperlipidemia; Hypertension; Anxiety; and Shingles. She  does not have any pertinent problems on file. She  has past surgical history that includes Vaginal hysterectomy and Cholecystectomy. Her family history includes Cancer in her mother and sister; Heart disease in her maternal grandfather and mother; Heart disease (age of onset: 65) in her maternal grandmother; Hypertension in her mother, paternal grandfather, and paternal grandmother; and Stroke in her paternal grandfather and paternal grandmother. She  reports that she has never smoked. She has never used smokeless tobacco. She reports that she does not drink alcohol or use illicit drugs. She has a current medication list which includes the following prescription(s): alprazolam,  aspirin, beclomethasone, carvedilol, furosemide, glucose blood, lisinopril, pantoprazole, simvastatin, spironolactone, tramadol, clidinium-chlordiazepoxide, and glimepiride. Current Outpatient Prescriptions on File Prior to Visit  Medication Sig Dispense Refill  . aspirin 81 MG tablet Take 81 mg by mouth daily.      . beclomethasone (QVAR) 40 MCG/ACT inhaler Inhale 2 puffs into the lungs 2 (two) times daily.      Marland Kitchen glucose blood (ONE TOUCH TEST STRIPS) test strip 1 each by Other route as needed. Use as instructed      . simvastatin (ZOCOR) 40 MG tablet TAKE 1 TABLET BY MOUTH IN THE EVENING  90 tablet  0  . clidinium-chlordiazePOXIDE (LIBRAX) 2.5-5 MG per capsule Take 1 capsule by mouth 3 (three) times daily as needed.  60 capsule  1   No current facility-administered medications on file prior to visit.   She is allergic to prednisone; codeine; and ibuprofen..  Review of Systems Review of Systems  Constitutional: Negative for activity change, appetite change and fatigue.  HENT: Negative for hearing loss, congestion, tinnitus and ear discharge.  dentist q74m Eyes: Negative for visual disturbance (see optho q1y -- vision corrected to 20/20 with glasses).  Respiratory: Negative for cough, chest tightness and shortness of breath.   Cardiovascular: Negative for chest pain, palpitations and leg swelling.  Gastrointestinal: Negative for abdominal pain, diarrhea, constipation and abdominal distention.  Genitourinary: Negative for urgency, frequency, decreased urine volume and difficulty urinating.  Musculoskeletal: + back and hip pain Skin: Negative for color change, pallor and rash.  Neurological: Negative for dizziness, light-headedness,  numbness and headaches.  Hematological: Negative for adenopathy. Does not bruise/bleed easily.  Psychiatric/Behavioral: Negative for suicidal ideas, confusion, sleep disturbance, self-injury, dysphoric mood, decreased concentration and agitation.        Objective:    BP 108/60  Pulse 77  Temp(Src) 98.5 F (36.9 C) (Oral)  Ht 5\' 3"  (1.6 m)  Wt 189 lb 6.4 oz (85.911 kg)  BMI 33.56 kg/m2  SpO2 97% General appearance: alert, cooperative, appears stated age and no distress Head: Normocephalic, without obvious abnormality, atraumatic Eyes: conjunctivae/corneas clear. PERRL, EOM's intact. Fundi benign. Ears: normal TM's and external ear canals both ears Nose: Nares normal. Septum midline. Mucosa normal. No drainage or sinus tenderness. Throat: lips, mucosa, and tongue normal; teeth and gums normal Neck: no adenopathy, no carotid bruit, no JVD, supple, symmetrical, trachea midline and thyroid not enlarged, symmetric, no tenderness/mass/nodules Back: symmetric, no curvature. ROM normal. No CVA tenderness. Lungs: clear to auscultation bilaterally Breasts: normal appearance, no masses or tenderness Heart: S1, S2 normal Abdomen: soft, non-tender; bowel sounds normal; no masses,  no organomegaly Pelvic: not indicated; post-menopausal, no abnormal Pap smears in past Extremities: extremities normal, atraumatic, no cyanosis or edema Pulses: 2+ and symmetric Skin: Skin color, texture, turgor normal. No rashes or lesions Lymph nodes: Cervical, supraclavicular, and axillary nodes normal. Neurologic: Mental status: Alert, oriented, thought content appropriate Motor: + weakness L hip flexors Reflexes: 2+ and symmetric Gait: Antalgic Psych-- no depression, no anxiety      Assessment:    Healthy female exam.      Plan:    check labs ghm utd See After Visit Summary for Counseling Recommendations

## 2012-11-13 NOTE — Assessment & Plan Note (Signed)
Check labs Consider rheum referral 

## 2012-11-13 NOTE — Assessment & Plan Note (Signed)
Stable con't meds 

## 2012-11-13 NOTE — Patient Instructions (Signed)
Preventive Care for Adults, Female A healthy lifestyle and preventive care can promote health and wellness. Preventive health guidelines for women include the following key practices.  A routine yearly physical is a good way to check with your caregiver about your health and preventive screening. It is a chance to share any concerns and updates on your health, and to receive a thorough exam.  Visit your dentist for a routine exam and preventive care every 6 months. Brush your teeth twice a day and floss once a day. Good oral hygiene prevents tooth decay and gum disease.  The frequency of eye exams is based on your age, health, family medical history, use of contact lenses, and other factors. Follow your caregiver's recommendations for frequency of eye exams.  Eat a healthy diet. Foods like vegetables, fruits, whole grains, low-fat dairy products, and lean protein foods contain the nutrients you need without too many calories. Decrease your intake of foods high in solid fats, added sugars, and salt. Eat the right amount of calories for you.Get information about a proper diet from your caregiver, if necessary.  Regular physical exercise is one of the most important things you can do for your health. Most adults should get at least 150 minutes of moderate-intensity exercise (any activity that increases your heart rate and causes you to sweat) each week. In addition, most adults need muscle-strengthening exercises on 2 or more days a week.  Maintain a healthy weight. The body mass index (BMI) is a screening tool to identify possible weight problems. It provides an estimate of body fat based on height and weight. Your caregiver can help determine your BMI, and can help you achieve or maintain a healthy weight.For adults 20 years and older:  A BMI below 18.5 is considered underweight.  A BMI of 18.5 to 24.9 is normal.  A BMI of 25 to 29.9 is considered overweight.  A BMI of 30 and above is  considered obese.  Maintain normal blood lipids and cholesterol levels by exercising and minimizing your intake of saturated fat. Eat a balanced diet with plenty of fruit and vegetables. Blood tests for lipids and cholesterol should begin at age 20 and be repeated every 5 years. If your lipid or cholesterol levels are high, you are over 50, or you are at high risk for heart disease, you may need your cholesterol levels checked more frequently.Ongoing high lipid and cholesterol levels should be treated with medicines if diet and exercise are not effective.  If you smoke, find out from your caregiver how to quit. If you do not use tobacco, do not start.  If you are pregnant, do not drink alcohol. If you are breastfeeding, be very cautious about drinking alcohol. If you are not pregnant and choose to drink alcohol, do not exceed 1 drink per day. One drink is considered to be 12 ounces (355 mL) of beer, 5 ounces (148 mL) of wine, or 1.5 ounces (44 mL) of liquor.  Avoid use of street drugs. Do not share needles with anyone. Ask for help if you need support or instructions about stopping the use of drugs.  High blood pressure causes heart disease and increases the risk of stroke. Your blood pressure should be checked at least every 1 to 2 years. Ongoing high blood pressure should be treated with medicines if weight loss and exercise are not effective.  If you are 55 to 59 years old, ask your caregiver if you should take aspirin to prevent strokes.  Diabetes   screening involves taking a blood sample to check your fasting blood sugar level. This should be done once every 3 years, after age 45, if you are within normal weight and without risk factors for diabetes. Testing should be considered at a younger age or be carried out more frequently if you are overweight and have at least 1 risk factor for diabetes.  Breast cancer screening is essential preventive care for women. You should practice "breast  self-awareness." This means understanding the normal appearance and feel of your breasts and may include breast self-examination. Any changes detected, no matter how small, should be reported to a caregiver. Women in their 20s and 30s should have a clinical breast exam (CBE) by a caregiver as part of a regular health exam every 1 to 3 years. After age 40, women should have a CBE every year. Starting at age 40, women should consider having a mammography (breast X-ray test) every year. Women who have a family history of breast cancer should talk to their caregiver about genetic screening. Women at a high risk of breast cancer should talk to their caregivers about having magnetic resonance imaging (MRI) and a mammography every year.  The Pap test is a screening test for cervical cancer. A Pap test can show cell changes on the cervix that might become cervical cancer if left untreated. A Pap test is a procedure in which cells are obtained and examined from the lower end of the uterus (cervix).  Women should have a Pap test starting at age 21.  Between ages 21 and 29, Pap tests should be repeated every 2 years.  Beginning at age 30, you should have a Pap test every 3 years as long as the past 3 Pap tests have been normal.  Some women have medical problems that increase the chance of getting cervical cancer. Talk to your caregiver about these problems. It is especially important to talk to your caregiver if a new problem develops soon after your last Pap test. In these cases, your caregiver may recommend more frequent screening and Pap tests.  The above recommendations are the same for women who have or have not gotten the vaccine for human papillomavirus (HPV).  If you had a hysterectomy for a problem that was not cancer or a condition that could lead to cancer, then you no longer need Pap tests. Even if you no longer need a Pap test, a regular exam is a good idea to make sure no other problems are  starting.  If you are between ages 65 and 70, and you have had normal Pap tests going back 10 years, you no longer need Pap tests. Even if you no longer need a Pap test, a regular exam is a good idea to make sure no other problems are starting.  If you have had past treatment for cervical cancer or a condition that could lead to cancer, you need Pap tests and screening for cancer for at least 20 years after your treatment.  If Pap tests have been discontinued, risk factors (such as a new sexual partner) need to be reassessed to determine if screening should be resumed.  The HPV test is an additional test that may be used for cervical cancer screening. The HPV test looks for the virus that can cause the cell changes on the cervix. The cells collected during the Pap test can be tested for HPV. The HPV test could be used to screen women aged 30 years and older, and should   be used in women of any age who have unclear Pap test results. After the age of 30, women should have HPV testing at the same frequency as a Pap test.  Colorectal cancer can be detected and often prevented. Most routine colorectal cancer screening begins at the age of 50 and continues through age 75. However, your caregiver may recommend screening at an earlier age if you have risk factors for colon cancer. On a yearly basis, your caregiver may provide home test kits to check for hidden blood in the stool. Use of a small camera at the end of a tube, to directly examine the colon (sigmoidoscopy or colonoscopy), can detect the earliest forms of colorectal cancer. Talk to your caregiver about this at age 50, when routine screening begins. Direct examination of the colon should be repeated every 5 to 10 years through age 75, unless early forms of pre-cancerous polyps or small growths are found.  Hepatitis C blood testing is recommended for all people born from 1945 through 1965 and any individual with known risks for hepatitis C.  Practice  safe sex. Use condoms and avoid high-risk sexual practices to reduce the spread of sexually transmitted infections (STIs). STIs include gonorrhea, chlamydia, syphilis, trichomonas, herpes, HPV, and human immunodeficiency virus (HIV). Herpes, HIV, and HPV are viral illnesses that have no cure. They can result in disability, cancer, and death. Sexually active women aged 25 and younger should be checked for chlamydia. Older women with new or multiple partners should also be tested for chlamydia. Testing for other STIs is recommended if you are sexually active and at increased risk.  Osteoporosis is a disease in which the bones lose minerals and strength with aging. This can result in serious bone fractures. The risk of osteoporosis can be identified using a bone density scan. Women ages 65 and over and women at risk for fractures or osteoporosis should discuss screening with their caregivers. Ask your caregiver whether you should take a calcium supplement or vitamin D to reduce the rate of osteoporosis.  Menopause can be associated with physical symptoms and risks. Hormone replacement therapy is available to decrease symptoms and risks. You should talk to your caregiver about whether hormone replacement therapy is right for you.  Use sunscreen with sun protection factor (SPF) of 30 or more. Apply sunscreen liberally and repeatedly throughout the day. You should seek shade when your shadow is shorter than you. Protect yourself by wearing long sleeves, pants, a wide-brimmed hat, and sunglasses year round, whenever you are outdoors.  Once a month, do a whole body skin exam, using a mirror to look at the skin on your back. Notify your caregiver of new moles, moles that have irregular borders, moles that are larger than a pencil eraser, or moles that have changed in shape or color.  Stay current with required immunizations.  Influenza. You need a dose every fall (or winter). The composition of the flu vaccine  changes each year, so being vaccinated once is not enough.  Pneumococcal polysaccharide. You need 1 to 2 doses if you smoke cigarettes or if you have certain chronic medical conditions. You need 1 dose at age 65 (or older) if you have never been vaccinated.  Tetanus, diphtheria, pertussis (Tdap, Td). Get 1 dose of Tdap vaccine if you are younger than age 65, are over 65 and have contact with an infant, are a healthcare worker, are pregnant, or simply want to be protected from whooping cough. After that, you need a Td   booster dose every 10 years. Consult your caregiver if you have not had at least 3 tetanus and diphtheria-containing shots sometime in your life or have a deep or dirty wound.  HPV. You need this vaccine if you are a woman age 26 or younger. The vaccine is given in 3 doses over 6 months.  Measles, mumps, rubella (MMR). You need at least 1 dose of MMR if you were born in 1957 or later. You may also need a second dose.  Meningococcal. If you are age 19 to 21 and a first-year college student living in a residence hall, or have one of several medical conditions, you need to get vaccinated against meningococcal disease. You may also need additional booster doses.  Zoster (shingles). If you are age 60 or older, you should get this vaccine.  Varicella (chickenpox). If you have never had chickenpox or you were vaccinated but received only 1 dose, talk to your caregiver to find out if you need this vaccine.  Hepatitis A. You need this vaccine if you have a specific risk factor for hepatitis A virus infection or you simply wish to be protected from this disease. The vaccine is usually given as 2 doses, 6 to 18 months apart.  Hepatitis B. You need this vaccine if you have a specific risk factor for hepatitis B virus infection or you simply wish to be protected from this disease. The vaccine is given in 3 doses, usually over 6 months. Preventive Services / Frequency Ages 19 to 39  Blood  pressure check.** / Every 1 to 2 years.  Lipid and cholesterol check.** / Every 5 years beginning at age 20.  Clinical breast exam.** / Every 3 years for women in their 20s and 30s.  Pap test.** / Every 2 years from ages 21 through 29. Every 3 years starting at age 30 through age 65 or 70 with a history of 3 consecutive normal Pap tests.  HPV screening.** / Every 3 years from ages 30 through ages 65 to 70 with a history of 3 consecutive normal Pap tests.  Hepatitis C blood test.** / For any individual with known risks for hepatitis C.  Skin self-exam. / Monthly.  Influenza immunization.** / Every year.  Pneumococcal polysaccharide immunization.** / 1 to 2 doses if you smoke cigarettes or if you have certain chronic medical conditions.  Tetanus, diphtheria, pertussis (Tdap, Td) immunization. / A one-time dose of Tdap vaccine. After that, you need a Td booster dose every 10 years.  HPV immunization. / 3 doses over 6 months, if you are 26 and younger.  Measles, mumps, rubella (MMR) immunization. / You need at least 1 dose of MMR if you were born in 1957 or later. You may also need a second dose.  Meningococcal immunization. / 1 dose if you are age 19 to 21 and a first-year college student living in a residence hall, or have one of several medical conditions, you need to get vaccinated against meningococcal disease. You may also need additional booster doses.  Varicella immunization.** / Consult your caregiver.  Hepatitis A immunization.** / Consult your caregiver. 2 doses, 6 to 18 months apart.  Hepatitis B immunization.** / Consult your caregiver. 3 doses usually over 6 months. Ages 40 to 64  Blood pressure check.** / Every 1 to 2 years.  Lipid and cholesterol check.** / Every 5 years beginning at age 20.  Clinical breast exam.** / Every year after age 40.  Mammogram.** / Every year beginning at age 40   and continuing for as long as you are in good health. Consult with your  caregiver.  Pap test.** / Every 3 years starting at age 30 through age 65 or 70 with a history of 3 consecutive normal Pap tests.  HPV screening.** / Every 3 years from ages 30 through ages 65 to 70 with a history of 3 consecutive normal Pap tests.  Fecal occult blood test (FOBT) of stool. / Every year beginning at age 50 and continuing until age 75. You may not need to do this test if you get a colonoscopy every 10 years.  Flexible sigmoidoscopy or colonoscopy.** / Every 5 years for a flexible sigmoidoscopy or every 10 years for a colonoscopy beginning at age 50 and continuing until age 75.  Hepatitis C blood test.** / For all people born from 1945 through 1965 and any individual with known risks for hepatitis C.  Skin self-exam. / Monthly.  Influenza immunization.** / Every year.  Pneumococcal polysaccharide immunization.** / 1 to 2 doses if you smoke cigarettes or if you have certain chronic medical conditions.  Tetanus, diphtheria, pertussis (Tdap, Td) immunization.** / A one-time dose of Tdap vaccine. After that, you need a Td booster dose every 10 years.  Measles, mumps, rubella (MMR) immunization. / You need at least 1 dose of MMR if you were born in 1957 or later. You may also need a second dose.  Varicella immunization.** / Consult your caregiver.  Meningococcal immunization.** / Consult your caregiver.  Hepatitis A immunization.** / Consult your caregiver. 2 doses, 6 to 18 months apart.  Hepatitis B immunization.** / Consult your caregiver. 3 doses, usually over 6 months. Ages 65 and over  Blood pressure check.** / Every 1 to 2 years.  Lipid and cholesterol check.** / Every 5 years beginning at age 20.  Clinical breast exam.** / Every year after age 40.  Mammogram.** / Every year beginning at age 40 and continuing for as long as you are in good health. Consult with your caregiver.  Pap test.** / Every 3 years starting at age 30 through age 65 or 70 with a 3  consecutive normal Pap tests. Testing can be stopped between 65 and 70 with 3 consecutive normal Pap tests and no abnormal Pap or HPV tests in the past 10 years.  HPV screening.** / Every 3 years from ages 30 through ages 65 or 70 with a history of 3 consecutive normal Pap tests. Testing can be stopped between 65 and 70 with 3 consecutive normal Pap tests and no abnormal Pap or HPV tests in the past 10 years.  Fecal occult blood test (FOBT) of stool. / Every year beginning at age 50 and continuing until age 75. You may not need to do this test if you get a colonoscopy every 10 years.  Flexible sigmoidoscopy or colonoscopy.** / Every 5 years for a flexible sigmoidoscopy or every 10 years for a colonoscopy beginning at age 50 and continuing until age 75.  Hepatitis C blood test.** / For all people born from 1945 through 1965 and any individual with known risks for hepatitis C.  Osteoporosis screening.** / A one-time screening for women ages 65 and over and women at risk for fractures or osteoporosis.  Skin self-exam. / Monthly.  Influenza immunization.** / Every year.  Pneumococcal polysaccharide immunization.** / 1 dose at age 65 (or older) if you have never been vaccinated.  Tetanus, diphtheria, pertussis (Tdap, Td) immunization. / A one-time dose of Tdap vaccine if you are over   65 and have contact with an infant, are a healthcare worker, or simply want to be protected from whooping cough. After that, you need a Td booster dose every 10 years.  Varicella immunization.** / Consult your caregiver.  Meningococcal immunization.** / Consult your caregiver.  Hepatitis A immunization.** / Consult your caregiver. 2 doses, 6 to 18 months apart.  Hepatitis B immunization.** / Check with your caregiver. 3 doses, usually over 6 months. ** Family history and personal history of risk and conditions may change your caregiver's recommendations. Document Released: 08/07/2001 Document Revised: 09/03/2011  Document Reviewed: 11/06/2010 [employer] Patient Information 2014 [employer].  

## 2012-11-13 NOTE — Assessment & Plan Note (Signed)
Check labs con't meds 

## 2012-11-13 NOTE — Assessment & Plan Note (Signed)
Refill meds Pt knows she needs to go back to ortho-- she knows she needs hip and knee surgry but she just can't afford it now

## 2012-11-13 NOTE — Assessment & Plan Note (Addendum)
Controlled Check labs-- pt has been having lows < 100--- cut amaryl in 1/2 Pt due for optho-- pt states she can't afford to go-- pt aware of importance

## 2012-11-14 LAB — RHEUMATOID FACTOR: Rhuematoid fact SerPl-aCnc: <10 IU/mL (ref <=14)

## 2012-12-05 ENCOUNTER — Encounter: Payer: Self-pay | Admitting: Family Medicine

## 2012-12-29 ENCOUNTER — Telehealth: Payer: Self-pay | Admitting: Family Medicine

## 2012-12-29 NOTE — Telephone Encounter (Signed)
She really should not be on meds like that because it can hurt her kidneys

## 2012-12-29 NOTE — Telephone Encounter (Signed)
Please advise      KP 

## 2012-12-29 NOTE — Telephone Encounter (Signed)
Patient states that her daughter has been giving her a prescription called Diclofenac 75mg  and she takes 2 tablets per day. This has been working well for her and she wants to know if this is something she can switch to.

## 2012-12-30 NOTE — Telephone Encounter (Signed)
msg left to call the office     KP 

## 2012-12-30 NOTE — Telephone Encounter (Signed)
Discuss with patient  

## 2013-01-01 ENCOUNTER — Other Ambulatory Visit: Payer: Self-pay

## 2013-02-01 ENCOUNTER — Other Ambulatory Visit: Payer: Self-pay | Admitting: Family Medicine

## 2013-02-02 ENCOUNTER — Other Ambulatory Visit: Payer: Self-pay | Admitting: *Deleted

## 2013-02-02 ENCOUNTER — Telehealth: Payer: Self-pay | Admitting: *Deleted

## 2013-02-02 DIAGNOSIS — M1990 Unspecified osteoarthritis, unspecified site: Secondary | ICD-10-CM

## 2013-02-02 MED ORDER — LIDOCAINE 5 % EX PTCH: 1.0000 | MEDICATED_PATCH | CUTANEOUS | Status: DC

## 2013-02-02 NOTE — Telephone Encounter (Signed)
Last seen and filled 11/13/12 #90. UDS 11/13/12 No Risk documented. Please advise      KP

## 2013-02-02 NOTE — Telephone Encounter (Signed)
Pharmacy is requesting a refill for Lidocaine patches last ov 11/13/12. Last date filled 11/13/12 # 30 0-R.   Please Advise   Ag cma

## 2013-02-02 NOTE — Telephone Encounter (Signed)
Refill x1 

## 2013-02-02 NOTE — Telephone Encounter (Signed)
Pt UDS + hydromorphone ( dilaudid ) and we did not rx it---- who is rx it?

## 2013-02-02 NOTE — Addendum Note (Signed)
Addended by: Arnette Norris on: 02/02/2013 09:10 AM   Modules accepted: Orders

## 2013-02-02 NOTE — Telephone Encounter (Signed)
Rx refilled #30 0-R.  Ag cma

## 2013-02-16 ENCOUNTER — Other Ambulatory Visit: Payer: Self-pay | Admitting: *Deleted

## 2013-02-16 ENCOUNTER — Telehealth: Payer: Self-pay | Admitting: *Deleted

## 2013-02-16 DIAGNOSIS — M1990 Unspecified osteoarthritis, unspecified site: Secondary | ICD-10-CM

## 2013-02-16 MED ORDER — TRAMADOL HCL 50 MG PO TABS: ORAL_TABLET | ORAL | Status: DC

## 2013-02-16 NOTE — Telephone Encounter (Signed)
Ok to refill x 1  

## 2013-02-16 NOTE — Telephone Encounter (Signed)
Rx has been printed.  Ag cma

## 2013-02-16 NOTE — Telephone Encounter (Signed)
Pharmacy's requesting a refill for Tramadol 50 mg.  Last ov 11/13/12, Last date filled 11/13/12 #60 3R patient has a controlled substance contract on file and last UDS was 11/13/12.    Please Advise Ag cma.

## 2013-03-02 ENCOUNTER — Other Ambulatory Visit: Payer: Self-pay | Admitting: Family Medicine

## 2013-04-14 ENCOUNTER — Other Ambulatory Visit: Payer: Self-pay | Admitting: Family Medicine

## 2013-04-14 NOTE — Telephone Encounter (Signed)
Last seen 11/13/12 and filled 02/01/13 #90   Controlled Substance Contract signed by Beth Shepard on 11/13/12.  +Hydromorphone 50.2/50  Prescribing physician is unknown.

## 2013-06-02 ENCOUNTER — Other Ambulatory Visit: Payer: Self-pay

## 2013-06-02 DIAGNOSIS — I10 Essential (primary) hypertension: Secondary | ICD-10-CM

## 2013-06-02 MED ORDER — LISINOPRIL 20 MG PO TABS: 20.0000 mg | ORAL_TABLET | Freq: Every day | ORAL | Status: DC

## 2013-06-17 ENCOUNTER — Other Ambulatory Visit: Payer: Self-pay | Admitting: Family Medicine

## 2013-06-17 NOTE — Telephone Encounter (Signed)
Simvastatin refilled per protocol. JG//CMA 

## 2013-07-26 ENCOUNTER — Other Ambulatory Visit: Payer: Self-pay | Admitting: Family Medicine

## 2013-07-27 NOTE — Telephone Encounter (Signed)
Last seen 11/14/11 and filled 04/14/13 #90.  UDS 11/13/12 Neg for Xanax   Please advise      KP

## 2013-08-15 ENCOUNTER — Other Ambulatory Visit: Payer: Self-pay | Admitting: Family Medicine

## 2013-08-17 NOTE — Telephone Encounter (Signed)
This patient was last seen 11/13/12 and filled 02/16/13 #60. Please advise     KP

## 2013-10-16 ENCOUNTER — Other Ambulatory Visit: Payer: Self-pay

## 2013-10-16 MED ORDER — SIMVASTATIN 40 MG PO TABS: ORAL_TABLET | ORAL | Status: DC

## 2013-10-21 ENCOUNTER — Encounter: Payer: Self-pay | Admitting: Gastroenterology

## 2013-10-21 ENCOUNTER — Encounter: Payer: Self-pay | Admitting: Family Medicine

## 2013-10-21 ENCOUNTER — Other Ambulatory Visit: Payer: Self-pay | Admitting: Family Medicine

## 2013-10-21 ENCOUNTER — Ambulatory Visit (INDEPENDENT_AMBULATORY_CARE_PROVIDER_SITE_OTHER): Payer: BC Managed Care – PPO | Admitting: Family Medicine

## 2013-10-21 VITALS — BP 132/84 | HR 78 | Temp 98.6°F | Wt 191.6 lb

## 2013-10-21 DIAGNOSIS — R1013 Epigastric pain: Secondary | ICD-10-CM

## 2013-10-21 DIAGNOSIS — K3189 Other diseases of stomach and duodenum: Secondary | ICD-10-CM

## 2013-10-21 DIAGNOSIS — R197 Diarrhea, unspecified: Secondary | ICD-10-CM

## 2013-10-21 DIAGNOSIS — E669 Obesity, unspecified: Secondary | ICD-10-CM | POA: Insufficient documentation

## 2013-10-21 DIAGNOSIS — R319 Hematuria, unspecified: Secondary | ICD-10-CM

## 2013-10-21 LAB — HEPATIC FUNCTION PANEL
ALT: 11 U/L (ref 0–35)
AST: 17 U/L (ref 0–37)
Albumin: 4.3 g/dL (ref 3.5–5.2)
Alkaline Phosphatase: 71 U/L (ref 39–117)
BILIRUBIN DIRECT: 0.1 mg/dL (ref 0.0–0.3)
Indirect Bilirubin: 0.3 mg/dL (ref 0.2–1.2)
TOTAL PROTEIN: 6.9 g/dL (ref 6.0–8.3)
Total Bilirubin: 0.4 mg/dL (ref 0.2–1.2)

## 2013-10-21 LAB — CBC WITH DIFFERENTIAL/PLATELET
BASOS ABS: 0.1 10*3/uL (ref 0.0–0.1)
Basophils Relative: 1 % (ref 0–1)
EOS ABS: 0.2 10*3/uL (ref 0.0–0.7)
EOS PCT: 2 % (ref 0–5)
HEMATOCRIT: 34.2 % — AB (ref 36.0–46.0)
Hemoglobin: 11.6 g/dL — ABNORMAL LOW (ref 12.0–15.0)
LYMPHS PCT: 24 % (ref 12–46)
Lymphs Abs: 2 10*3/uL (ref 0.7–4.0)
MCH: 28.2 pg (ref 26.0–34.0)
MCHC: 33.9 g/dL (ref 30.0–36.0)
MCV: 83.2 fL (ref 78.0–100.0)
MONO ABS: 0.6 10*3/uL (ref 0.1–1.0)
Monocytes Relative: 7 % (ref 3–12)
Neutro Abs: 5.6 10*3/uL (ref 1.7–7.7)
Neutrophils Relative %: 66 % (ref 43–77)
PLATELETS: 299 10*3/uL (ref 150–400)
RBC: 4.11 MIL/uL (ref 3.87–5.11)
RDW: 13.5 % (ref 11.5–15.5)
WBC: 8.5 10*3/uL (ref 4.0–10.5)

## 2013-10-21 LAB — BASIC METABOLIC PANEL
BUN: 24 mg/dL — ABNORMAL HIGH (ref 6–23)
CO2: 26 mEq/L (ref 19–32)
Calcium: 9.5 mg/dL (ref 8.4–10.5)
Chloride: 104 mEq/L (ref 96–112)
Creat: 1.13 mg/dL — ABNORMAL HIGH (ref 0.50–1.10)
GLUCOSE: 101 mg/dL — AB (ref 70–99)
POTASSIUM: 5 meq/L (ref 3.5–5.3)
SODIUM: 139 meq/L (ref 135–145)

## 2013-10-21 LAB — POCT URINALYSIS DIPSTICK
Bilirubin, UA: NEGATIVE
Glucose, UA: NEGATIVE
KETONES UA: NEGATIVE
LEUKOCYTES UA: NEGATIVE
Nitrite, UA: NEGATIVE
PH UA: 5
Protein, UA: NEGATIVE
Spec Grav, UA: 1.01
Urobilinogen, UA: 0.2

## 2013-10-21 MED ORDER — GI COCKTAIL ~~LOC~~
30.0000 mL | Freq: Once | ORAL | Status: AC
  Administered 2013-10-21: 30 mL via ORAL

## 2013-10-21 MED ORDER — HYOSCYAMINE SULFATE 0.125 MG SL SUBL: 0.1250 mg | SUBLINGUAL_TABLET | SUBLINGUAL | Status: DC | PRN

## 2013-10-21 NOTE — Progress Notes (Signed)
Pre visit review using our clinic review tool, if applicable. No additional management support is needed unless otherwise documented below in the visit note. 

## 2013-10-21 NOTE — Patient Instructions (Signed)

## 2013-10-21 NOTE — Progress Notes (Signed)
  Subjective:     Beth Shepard is a 58 y.o. female who presents for evaluation of abdominal pain. Onset was several weeks ago. Symptoms have been unchanged. The pain is described as cramping, and is 6/10 in intensity. Pain is located in the epigastric region without radiation.  Aggravating factors: eating.  Alleviating factors: none. Associated symptoms: belching, diarrhea, nausea and vomiting. The patient denies constipation, dysuria, fever, frequency, headache, hematochezia, hematuria, melena and myalgias.  The patient's history has been marked as reviewed and updated as appropriate.  Review of Systems Pertinent items are noted in HPI.     Objective:    BP 132/84  Pulse 78  Temp(Src) 98.6 F (37 C) (Oral)  Wt 191 lb 9.6 oz (86.909 kg)  SpO2 97% General appearance: alert, cooperative, appears stated age and no distress Ears: normal TM's and external ear canals both ears Throat: lips, mucosa, and tongue normal; teeth and gums normal Neck: no adenopathy, supple, symmetrical, trachea midline and thyroid not enlarged, symmetric, no tenderness/mass/nodules Lungs: clear to auscultation bilaterally Heart: S1, S2 normal Abdomen: abnormal findings:  moderate tenderness in the epigastrium and in the lower abdomen    Assessment:    Abdominal pain, likely secondary to gerd--r/o h pylori/ ulcers .    Plan:    See orders for lab and imaging studies. Adhere to simple, bland diet. Further follow-up plans will be based on outcome of lab/imaging studies; see orders. Follow up with PCP in a few weeks. con't PPI and refer to GI

## 2013-10-22 ENCOUNTER — Encounter: Payer: Self-pay | Admitting: Gastroenterology

## 2013-10-22 ENCOUNTER — Ambulatory Visit (INDEPENDENT_AMBULATORY_CARE_PROVIDER_SITE_OTHER): Payer: BC Managed Care – PPO | Admitting: Gastroenterology

## 2013-10-22 VITALS — BP 90/60 | HR 62 | Ht 63.0 in | Wt 191.2 lb

## 2013-10-22 DIAGNOSIS — R109 Unspecified abdominal pain: Secondary | ICD-10-CM

## 2013-10-22 DIAGNOSIS — R112 Nausea with vomiting, unspecified: Secondary | ICD-10-CM

## 2013-10-22 LAB — TSH: TSH: 0.89 u[IU]/mL (ref 0.350–4.500)

## 2013-10-22 LAB — H. PYLORI ANTIBODY, IGG: H Pylori IgG: 0.74 {ISR}

## 2013-10-22 MED ORDER — ONDANSETRON HCL 4 MG PO TABS: 4.0000 mg | ORAL_TABLET | Freq: Three times a day (TID) | ORAL | Status: DC | PRN

## 2013-10-22 NOTE — Patient Instructions (Signed)
You have been scheduled for a gastric emptying scan at [facility] on 11/03/2013 at 9:30am . Please arrive at least 15 minutes prior to your appointment for registration. Please make certain not to have anything to eat or drink after midnight the night before your test. Hold all stomach medications (ex: Zofran, phenergan, Reglan) 48 hours prior to your test. If you need to reschedule your appointment, please contact radiology scheduling at [phone]. _____________________________________________________________________ A gastric-emptying study measures how long it takes for food to move through your stomach. There are several ways to measure stomach emptying. In the most common test, you eat food that contains a small amount of radioactive material. A scanner that detects the movement of the radioactive material is placed over your abdomen to monitor the rate at which food leaves your stomach. This test normally takes about 2 hours to complete. _____________________________________________________________________

## 2013-10-22 NOTE — Assessment & Plan Note (Signed)
3 week history of intermittent nausea, vomiting, diarrhea and crampy abdominal pain.  Symptoms certainly suggest a gastroenteritis though the course is very prolonged period.  Gastroparesis is a possibility.  Recommendations #1 gastric emptying scan #2 continue hyomax as needed #3 Zofran when necessary nausea

## 2013-10-22 NOTE — Progress Notes (Signed)
_                                                                                                                History of Present Illness: 58 year old white female with history of COPD, diabetes, IBS referred for evaluation of nausea, vomiting diarrhea.  For the past 3 weeks she has been suffering from intermittent nausea with vomiting, episodes of lightheadedness and diaphoresis and frequent loose stools.  She has had fever to 101.  She has some joint aches but that is not unusual since she has arthritis.  She complains of crampy abdominal pain and severe urgency.  She denies melena or hematochezia.  She has not been on antibiotics recently.  Lab work from yesterday was remarkable for normal white count and hemoglobin 11.6.  She takes Protonix.  Colonoscopy in 2006 was normal.    Past Medical History  Diagnosis Date  . COPD (chronic obstructive pulmonary disease)   . Diabetes mellitus     type 2  . GERD (gastroesophageal reflux disease)   . Hyperlipidemia   . Hypertension   . Anxiety   . Shingles   . LBBB (left bundle branch block)     Dr. Gwenith Spitz, [facility]  . Congestive heart failure    Past Surgical History  Procedure Laterality Date  . Vaginal hysterectomy    . Cholecystectomy    . Knee arthroscopy Left    family history includes Cancer in her mother and sister; GER disease in her mother; Gallstones in her mother; Heart disease in her maternal grandfather and mother; Heart disease (age of onset: 43) in her maternal grandmother; Hiatal hernia in her mother; Hypertension in her mother, paternal grandfather, and paternal grandmother; Rheum arthritis in her father; Stroke in her paternal grandfather and paternal grandmother. Current Outpatient Prescriptions  Medication Sig Dispense Refill  . ALPRAZolam (XANAX) 0.25 MG tablet TAKE 1 TABLET AT BEDTIME AS NEEDED FOR SLEEP  90 tablet  0  . aspirin 81 MG tablet Take 81 mg by mouth daily.      .  carvedilol (COREG) 6.25 MG tablet Take 2 tablets (12.5 mg total) by mouth 2 (two) times daily with a meal. TAKING 3 TABLETS EACH DOSE BID Dr Ola Spurr      . Cholecalciferol (VITAMIN D) 2000 UNITS CAPS Take by mouth.      . furosemide (LASIX) 20 MG tablet Take 1 tablet (20 mg total) by mouth daily. Dr Ola Spurr  30 tablet    . glimepiride (AMARYL) 2 MG tablet Take 1 tablet (2 mg total) by mouth daily before breakfast.  90 tablet  3  . glucose blood (ONE TOUCH TEST STRIPS) test strip 1 each by Other route as needed. Use as instructed      . hyoscyamine (LEVSIN/SL) 0.125 MG SL tablet Place 1 tablet (0.125 mg total) under the tongue every 4 (four) hours as needed.  30 tablet  0  . lisinopril (PRINIVIL,ZESTRIL) 20 MG tablet Take 1 tablet (20 mg total) by mouth daily.  Office visit due now  90 tablet  0  . Omega-3 Fatty Acids (OMEGA-3 FISH OIL PO) Take 1 tablet by mouth daily.      . pantoprazole (PROTONIX) 20 MG tablet TAKE 1 TABLET BY MOUTH EVERY DAY  90 tablet  2  . simvastatin (ZOCOR) 40 MG tablet TAKE 1 TABLET BY MOUTH IN THE EVENING--30 only patient is due for labs  30 tablet  0  . spironolactone (ALDACTONE) 25 MG tablet Take 1 tablet (25 mg total) by mouth every other day. Dr Ola Spurr      . traMADol (ULTRAM) 50 MG tablet TAKE 1 OR 2 TABLETS BY MOUTH EVERY 6 HOURS AS NEEDED  60 tablet  0  . Turmeric 500 MG CAPS Take 1 capsule by mouth daily.       No current facility-administered medications for this visit.   Allergies as of 10/22/2013 - Review Complete 10/22/2013  Allergen Reaction Noted  . Prednisone Palpitations and Other (See Comments) 09/03/2011  . Codeine  10/12/2005  . Ibuprofen  10/12/2005    reports that she has quit smoking. Her smoking use included Cigarettes. She smoked 0.00 packs per day for 33 years. She has never used smokeless tobacco. She reports that she does not drink alcohol or use illicit drugs.     Review of Systems: Pertinent positive and negative review of  systems were noted in the above HPI section. All other review of systems were otherwise negative.  Vital signs were reviewed in today's medical record Physical Exam: General: Well developed , well nourished, no acute distress Skin: anicteric Head: Normocephalic and atraumatic Eyes:  sclerae anicteric, EOMI Ears: Normal auditory acuity Mouth: No deformity or lesions Neck: Supple, no masses or thyromegaly Lungs: Clear throughout to auscultation Heart: Regular rate and rhythm; no murmurs, rubs or bruits Abdomen: Soft, non tender and non distended. No masses, hepatosplenomegaly or hernias noted. Normal Bowel sounds.  There is no succussion splash Rectal:deferred Musculoskeletal: Symmetrical with no gross deformities  Skin: No lesions on visible extremities Pulses:  Normal pulses noted Extremities: No clubbing, cyanosis, edema or deformities noted Neurological: Alert oriented x 4, grossly nonfocal Cervical Nodes:  No significant cervical adenopathy Inguinal Nodes: No significant inguinal adenopathy Psychological:  Alert and cooperative. Normal mood and affect  See Assessment and Plan under Problem List

## 2013-10-23 LAB — URINE CULTURE

## 2013-10-28 LAB — C. DIFFICILE GDH AND TOXIN A/B
C. difficile GDH: NOT DETECTED
C. difficile Toxin A/B: NOT DETECTED

## 2013-10-31 LAB — STOOL CULTURE

## 2013-11-03 ENCOUNTER — Encounter (HOSPITAL_COMMUNITY)
Admission: RE | Admit: 2013-11-03 | Discharge: 2013-11-03 | Disposition: A | Discharge status: Home / Self Care | Payer: BC Managed Care – PPO | Source: Ambulatory Visit | Attending: Gastroenterology | Admitting: Gastroenterology

## 2013-11-03 DIAGNOSIS — R112 Nausea with vomiting, unspecified: Secondary | ICD-10-CM | POA: Insufficient documentation

## 2013-11-03 DIAGNOSIS — R109 Unspecified abdominal pain: Secondary | ICD-10-CM | POA: Insufficient documentation

## 2013-11-03 MED ORDER — TECHNETIUM TC 99M SULFUR COLLOID
2.1000 | Freq: Once | INTRAVENOUS | Status: AC | PRN
  Administered 2013-11-03: 2.1 via ORAL

## 2013-11-04 NOTE — Progress Notes (Signed)
Quick Note:  Please inform the patient that GES was normal and to continue current plan of action ______ 

## 2013-11-05 ENCOUNTER — Other Ambulatory Visit: Payer: Self-pay

## 2013-11-05 MED ORDER — HYOSCYAMINE SULFATE ER 0.375 MG PO TBCR: 1.0000 | EXTENDED_RELEASE_TABLET | Freq: Two times a day (BID) | ORAL | Status: DC

## 2013-11-05 NOTE — Telephone Encounter (Signed)
Pt aware and new prescription called in she will call in a few days to report on her condition

## 2013-11-10 ENCOUNTER — Telehealth: Payer: Self-pay | Admitting: Gastroenterology

## 2013-11-10 NOTE — Telephone Encounter (Signed)
Left message for pt to call back.  Pt states the hyoscyamine is not helping. States she is still having cramping, nausea and diarrhea. Pt states she is also taking Zofran. Pt would like to see Dr. [name] but he is booked out. Offered pt an appt with [facility] this week. Pt states she will call back to schedule and appt with PA.

## 2013-11-10 NOTE — Telephone Encounter (Signed)
Pt would like to know what Dr. [name] recommends for her to do since the hyoscyamine has not helped, still having abdominal cramping and nausea that requires Zofran. Please advise.

## 2013-11-10 NOTE — Telephone Encounter (Signed)
Appointment scheduled w/Jessica Zehr for this Friday 11-13-13 at 8:30am

## 2013-11-11 ENCOUNTER — Encounter: Payer: Self-pay | Admitting: *Deleted

## 2013-11-13 ENCOUNTER — Ambulatory Visit (INDEPENDENT_AMBULATORY_CARE_PROVIDER_SITE_OTHER): Payer: BC Managed Care – PPO | Admitting: Gastroenterology

## 2013-11-13 ENCOUNTER — Other Ambulatory Visit (INDEPENDENT_AMBULATORY_CARE_PROVIDER_SITE_OTHER): Payer: BC Managed Care – PPO

## 2013-11-13 ENCOUNTER — Telehealth: Payer: Self-pay | Admitting: *Deleted

## 2013-11-13 ENCOUNTER — Encounter: Payer: Self-pay | Admitting: Gastroenterology

## 2013-11-13 ENCOUNTER — Telehealth: Payer: Self-pay | Admitting: Gastroenterology

## 2013-11-13 ENCOUNTER — Encounter: Payer: Self-pay | Admitting: Family Medicine

## 2013-11-13 VITALS — BP 90/52 | HR 80 | Ht 63.0 in | Wt 190.4 lb

## 2013-11-13 DIAGNOSIS — R143 Flatulence: Secondary | ICD-10-CM

## 2013-11-13 DIAGNOSIS — R197 Diarrhea, unspecified: Secondary | ICD-10-CM

## 2013-11-13 DIAGNOSIS — R141 Gas pain: Secondary | ICD-10-CM

## 2013-11-13 DIAGNOSIS — R140 Abdominal distension (gaseous): Secondary | ICD-10-CM

## 2013-11-13 DIAGNOSIS — R112 Nausea with vomiting, unspecified: Secondary | ICD-10-CM

## 2013-11-13 DIAGNOSIS — R142 Eructation: Secondary | ICD-10-CM

## 2013-11-13 LAB — BASIC METABOLIC PANEL
BUN: 31 mg/dL — AB (ref 6–23)
CO2: 30 mEq/L (ref 19–32)
CREATININE: 1.4 mg/dL — AB (ref 0.4–1.2)
Calcium: 9.8 mg/dL (ref 8.4–10.5)
Chloride: 101 mEq/L (ref 96–112)
GFR: 40.06 mL/min — AB (ref >60.00)
Glucose, Bld: 122 mg/dL — ABNORMAL HIGH (ref 70–99)
POTASSIUM: 4.9 meq/L (ref 3.5–5.1)
Sodium: 139 mEq/L (ref 135–145)

## 2013-11-13 LAB — CBC
HCT: 34 % — ABNORMAL LOW (ref 36.0–46.0)
Hemoglobin: 11.5 g/dL — ABNORMAL LOW (ref 12.0–15.0)
MCHC: 33.7 g/dL (ref 30.0–36.0)
MCV: 85.5 fl (ref 78.0–100.0)
PLATELETS: 323 10*3/uL (ref 150.0–400.0)
RBC: 3.98 Mil/uL (ref 3.87–5.11)
RDW: 12.7 % (ref 11.5–15.5)
WBC: 7.1 10*3/uL (ref 4.0–10.5)

## 2013-11-13 LAB — IGA: IgA: <30 mg/dL — ABNORMAL LOW (ref 68–378)

## 2013-11-13 MED ORDER — PANTOPRAZOLE SODIUM 20 MG PO TBEC: 20.0000 mg | DELAYED_RELEASE_TABLET | Freq: Two times a day (BID) | ORAL | Status: DC

## 2013-11-13 MED ORDER — CHOLESTYRAMINE 4 G PO PACK: 4.0000 g | PACK | Freq: Two times a day (BID) | ORAL | Status: DC

## 2013-11-13 NOTE — Telephone Encounter (Signed)
Patient requested in office that we call Dr. Atilano Median at [facility] to check if it is okay for her to have a procedure(EGD) completed. I called Dr. Atilano Median and per Dr. Atilano Median as long as patient is staying on her heart medication then she is fine to precede with procedure.

## 2013-11-13 NOTE — Telephone Encounter (Signed)
Spoke with patient and moved procedure as requested.

## 2013-11-13 NOTE — Telephone Encounter (Signed)
Spoke with patient and Beth Shepard is asking if it is possible for her to change her procedure to 11/17/13. Told her Dr [name] has a 4:00 PM currently. Beth Shepard will check with her daughter and call me back if Beth Shepard wants to change.

## 2013-11-13 NOTE — Patient Instructions (Signed)
You have been scheduled for an endoscopy with propofol with Dr. [name]. Please follow written instructions given to you at your visit today. If you use inhalers (even only as needed), please bring them with you on the day of your procedure. Your physician has requested that you go to www.startemmi.com and enter the access code given to you at your visit today. This web site gives a general overview about your procedure. However, you should still follow specific instructions given to you by our office regarding your preparation for the procedure.  Your physician has requested that you go to the basement for the following lab work before leaving today: TTG IGA CBC BMET  We have sent the following medications to your pharmacy for you to pick up at your convenience: Questran   Please increase your Pantoprazole to twice daily

## 2013-11-14 ENCOUNTER — Telehealth: Payer: Self-pay | Admitting: Gastroenterology

## 2013-11-14 NOTE — Telephone Encounter (Signed)
Pt called tonight complaining of ongoing cramping in her legs. I advised her to contact her PCP for advice.

## 2013-11-16 LAB — T-TRANSGLUTAMINASE (TTG) IGG: TISSUE TRANSGLUT AB: 2 U/mL (ref 0–5)

## 2013-11-17 ENCOUNTER — Encounter: Payer: Self-pay | Admitting: Gastroenterology

## 2013-11-17 ENCOUNTER — Ambulatory Visit (AMBULATORY_SURGERY_CENTER): Payer: BC Managed Care – PPO | Admitting: Gastroenterology

## 2013-11-17 VITALS — BP 92/62 | HR 76 | Temp 98.0°F | Resp 15 | Ht 63.0 in | Wt 190.0 lb

## 2013-11-17 DIAGNOSIS — R197 Diarrhea, unspecified: Secondary | ICD-10-CM

## 2013-11-17 DIAGNOSIS — D131 Benign neoplasm of stomach: Secondary | ICD-10-CM

## 2013-11-17 MED ORDER — AMPICILLIN 250 MG PO CAPS: 250.0000 mg | ORAL_CAPSULE | Freq: Four times a day (QID) | ORAL | Status: DC

## 2013-11-17 MED ORDER — SODIUM CHLORIDE 0.9 % IV SOLN: 500.0000 mL | INTRAVENOUS | Status: DC

## 2013-11-17 NOTE — Op Note (Signed)
[facility]  [address] [place], [id number]   ENDOSCOPY PROCEDURE REPORT  PATIENT: Beth Shepard, Beth Shepard  MR#: [id number] BIRTHDATE: [birth year] , 58  yrs. old GENDER: Female ENDOSCOPIST: Inda Castle, MD REFERRED BY:  Garnet Koyanagi, DO PROCEDURE DATE:  11/17/2013 PROCEDURE:  EGD, diagnostic ASA CLASS:     Class II INDICATIONS:  Nausea.   Vomiting.   Unexplained diarrhea. MEDICATIONS: MAC sedation, administered by CRNA, propofol (Diprivan) 100mg  IV, and Simethicone 0.6cc PO TOPICAL ANESTHETIC: Cetacaine Spray  DESCRIPTION OF PROCEDURE: After the risks benefits and alternatives of the procedure were thoroughly explained, informed consent was obtained.  The LB [id number] endoscope was introduced through the mouth and advanced to the third portion of the duodenum. Without limitations.  The instrument was slowly withdrawn as the mucosa was fully examined.      2 sessile fundic appearing polyps in body measuring 11mm.   The remainder of the upper endoscopy exam was otherwise normal. Retroflexed views revealed no abnormalities.     The scope was then withdrawn from the patient and the procedure completed.  COMPLICATIONS: There were no complications. ENDOSCOPIC IMPRESSION: 1.   2 sessile fundic appearing polyps in body measuring 60mm. 2.   The remainder of the upper endoscopy exam was otherwise normal  RECOMMENDATIONS: 1.  Trial of ampicillin for bacterial overgrowth Hold tramadol 2.  Trial of ampicillin for bacterial overgrowth 3.  Hold tramadol and protonix 4.  OV 3-4 weeks  REPEAT EXAM:  eSigned:  Inda Castle, MD 11/17/2013 4:44 PM   CC:

## 2013-11-17 NOTE — Patient Instructions (Signed)

## 2013-11-17 NOTE — Progress Notes (Signed)
Report to pacu rn, vss, bbs=clear 

## 2013-11-18 ENCOUNTER — Telehealth: Payer: Self-pay

## 2013-11-18 NOTE — Telephone Encounter (Signed)
  Follow up Call-  Call back number 11/17/2013  Post procedure Call Back phone  # [phone]  Permission to leave phone message Yes     Patient questions:  Do you have a fever, pain , or abdominal swelling? no Pain Score  4 *  Have you tolerated food without any problems? yes  Have you been able to return to your normal activities? yes  Do you have any questions about your discharge instructions: Diet   no Medications  no Follow up visit  no  Do you have questions or concerns about your Care? no  Actions: * If pain score is 4 or above: No action needed, pain <4.   Per the pt she still had diarrhea this am, which that was a sx she was having the procedure for.  Also c/o headache yesterday and this am.  I asked her what she ate yesterday, pt said a bowl of soup only.  I asked her to ate what she can tolerate and try to get a little more fluid in too if possible.  I also asked her to call back if her sx do not improve today.  Pt said she would. maw

## 2013-11-19 ENCOUNTER — Telehealth: Payer: Self-pay

## 2013-11-19 ENCOUNTER — Encounter: Payer: Self-pay | Admitting: *Deleted

## 2013-11-19 ENCOUNTER — Encounter: Payer: Self-pay | Admitting: Gastroenterology

## 2013-11-19 ENCOUNTER — Encounter: Payer: BC Managed Care – PPO | Admitting: Gastroenterology

## 2013-11-19 DIAGNOSIS — R112 Nausea with vomiting, unspecified: Secondary | ICD-10-CM | POA: Insufficient documentation

## 2013-11-19 DIAGNOSIS — R140 Abdominal distension (gaseous): Secondary | ICD-10-CM | POA: Insufficient documentation

## 2013-11-19 DIAGNOSIS — R198 Other specified symptoms and signs involving the digestive system and abdomen: Secondary | ICD-10-CM | POA: Insufficient documentation

## 2013-11-19 DIAGNOSIS — R197 Diarrhea, unspecified: Secondary | ICD-10-CM | POA: Insufficient documentation

## 2013-11-19 NOTE — Progress Notes (Signed)
     11/19/2013 Beth Shepard [id number] 06-08-1956   History of Present Illness:  This is a 58 year old female who is known to Dr. [name] and was recently seen on 10/22/2013 for complaints of nausea and vomiting.  This was occuring for 3 weeks at that time and was thought that it could be a gastroenteritis, but somewhat prolonged course.  GES was ordered and was normal.  She is here again today with ongoing complaints of nausea and vomiting, but also diarrhea and abdominal pain/bloating.  Symptoms have now been present for 6 weeks.  Has history of GERD and IBS dating back several years and she's had some similar symptoms in the past but nothing that has lasted this long or to this severity.  Cdiff and stool culture negative recently.  Hpylori IgG negative.  Doesn't have a gallbladder.  Denies seeing blood in her stool.  Complaining of diffuse muscle cramps.  Says that she feels ok when she wakes up but gets worse throughout the day.   Last colonoscopy was 05/2005 by Dr. Sharlett Iles and was normal at that time (had a very long, redundant colon).  EGD also in 05/2005 by Dr. Sharlett Iles showed some gastritis.  GI meds are currently pantoprazole 20 mg daily, Hyomax BID, and zofran prn.   Current Medications, Allergies, Past Medical History, Past Surgical History, Family History and Social History were reviewed in [employer] record.   Physical Exam: BP 90/52  Pulse 80  Ht 5\' 3"  (1.6 m)  Wt 190 lb 6.4 oz (86.365 kg)  BMI 33.74 kg/m2 General: Well developed white female in no acute distress Head: Normocephalic and atraumatic Eyes:  Sclerae anicteric, conjunctiva pink  Ears: Normal auditory acuity Lungs: Clear throughout to auscultation Heart: Regular rate and rhythm Abdomen: Soft, non-distended.  Normal bowel sounds.  Mild diffuse TTP without R/R/G.  Musculoskeletal: Symmetrical with no gross deformities  Extremities: No edema  Neurological: Alert oriented x 4, grossly  non-focal Psychological:  Alert and cooperative. Normal mood and affect  Assessment and Recommendations: -Abdominal pain, nausea, vomiting, diarrhea, and bloating x 6 weeks:  Initially (3 weeks ago) thought to be prolonged infectious course.  Has previous diagnosis of IBS with similar symptoms in the past, but this is much worse acutely.  ? Celiac disease vs IBS vs PUD vs other etiologies. -GERD:  Currently only on pantoprazole 20 mg daily.  -Body/muscle cramps:  ? If potassium is low.  *Will check labs including CBC, CMP, and celiac titers. *Will schedule EGD for further evaluation regarding the nausea/vomiting.   *Will increase pantoprazole to two pills daily since she is currently on taking 20 mg. *Continue zofran prn and Hyomax BID for now. *Will place her on questran BID to see if that helps the diarrhea.  If evaluation is negative and diarrhea continue then may want to consider repeat colonoscopy with biopsies to rule out microscopic colitis.  Has not had any other abdominal imaging in quite some time so that may want to be considered as well if symptoms continue.

## 2013-11-19 NOTE — Telephone Encounter (Signed)
Left message for call back Non-identifiable   Pap- hx. Hysterectomy CCS- 06/06/05 MMG BD Td- 11/13/12 PNA- 11/13/12 Eye exam-

## 2013-11-20 ENCOUNTER — Other Ambulatory Visit: Payer: Self-pay | Admitting: *Deleted

## 2013-11-20 ENCOUNTER — Encounter: Payer: Self-pay | Admitting: Family Medicine

## 2013-11-20 ENCOUNTER — Telehealth: Payer: Self-pay | Admitting: *Deleted

## 2013-11-20 ENCOUNTER — Ambulatory Visit (INDEPENDENT_AMBULATORY_CARE_PROVIDER_SITE_OTHER): Payer: BC Managed Care – PPO | Admitting: Family Medicine

## 2013-11-20 VITALS — BP 100/68 | HR 62 | Temp 98.9°F | Ht 63.0 in | Wt 189.0 lb

## 2013-11-20 DIAGNOSIS — Z0000 Encounter for general adult medical examination without abnormal findings: Secondary | ICD-10-CM

## 2013-11-20 DIAGNOSIS — R945 Abnormal results of liver function studies: Principal | ICD-10-CM

## 2013-11-20 DIAGNOSIS — R7989 Other specified abnormal findings of blood chemistry: Secondary | ICD-10-CM

## 2013-11-20 DIAGNOSIS — E1159 Type 2 diabetes mellitus with other circulatory complications: Secondary | ICD-10-CM

## 2013-11-20 DIAGNOSIS — I10 Essential (primary) hypertension: Secondary | ICD-10-CM

## 2013-11-20 DIAGNOSIS — E119 Type 2 diabetes mellitus without complications: Secondary | ICD-10-CM

## 2013-11-20 DIAGNOSIS — R8290 Unspecified abnormal findings in urine: Secondary | ICD-10-CM

## 2013-11-20 DIAGNOSIS — E785 Hyperlipidemia, unspecified: Secondary | ICD-10-CM

## 2013-11-20 DIAGNOSIS — F411 Generalized anxiety disorder: Secondary | ICD-10-CM

## 2013-11-20 DIAGNOSIS — R82998 Other abnormal findings in urine: Secondary | ICD-10-CM

## 2013-11-20 DIAGNOSIS — M1990 Unspecified osteoarthritis, unspecified site: Secondary | ICD-10-CM

## 2013-11-20 MED ORDER — SIMVASTATIN 40 MG PO TABS: ORAL_TABLET | ORAL | Status: DC

## 2013-11-20 MED ORDER — ALPRAZOLAM 0.25 MG PO TABS: ORAL_TABLET | ORAL | Status: DC

## 2013-11-20 MED ORDER — HYDROCODONE-ACETAMINOPHEN 5-325 MG PO TABS: 1.0000 | ORAL_TABLET | Freq: Four times a day (QID) | ORAL | Status: DC | PRN

## 2013-11-20 MED ORDER — LISINOPRIL 20 MG PO TABS: 20.0000 mg | ORAL_TABLET | Freq: Every day | ORAL | Status: DC

## 2013-11-20 MED ORDER — GLIMEPIRIDE 2 MG PO TABS: 2.0000 mg | ORAL_TABLET | Freq: Every day | ORAL | Status: DC

## 2013-11-20 NOTE — Telephone Encounter (Signed)
Left a message for patient to call me. 

## 2013-11-20 NOTE — Progress Notes (Signed)
Subjective:     Beth Shepard is a 58 y.o. female and is here for a comprehensive physical exam. The patient reports problems - frustrated with the cont diarrhea and abd pain-- egd done tuesday.  History   Social History  . Marital Status: Widowed    Spouse Name: N/A    Number of Children: 2  . Years of Education: N/A   Occupational History  . Administive     Social History Main Topics  . Smoking status: Former Smoker -- 33 years    Types: Cigarettes    Quit date: 06/25/2002  . Smokeless tobacco: Never Used  . Alcohol Use: Yes     Comment: rare  . Drug Use: No  . Sexual Activity: Not Currently    Partners: Male   Other Topics Concern  . Not on file   Social History Narrative   Limited exercise per cardiology   Health Maintenance  Topic Date Due  . Ophthalmology Exam  07/19/1963  . Mammogram  07/19/2003  . Hemoglobin A1c  05/16/2013  . Zostavax  07/18/2013  . Foot Exam  11/13/2013  . Urine Microalbumin  11/13/2013  . Influenza Vaccine  01/23/2014  . Pap Smear  09/24/2014  . Colonoscopy  06/07/2015  . Pneumococcal Polysaccharide Vaccine (##2) 11/13/2017  . Tetanus/tdap  11/14/2022    The following portions of the patient's history were reviewed and updated as appropriate:  She  has a past medical history of COPD (chronic obstructive pulmonary disease); Diabetes mellitus; GERD (gastroesophageal reflux disease); Hyperlipidemia; Hypertension; Anxiety; Shingles; LBBB (left bundle branch block); and Congestive heart failure. She  does not have any pertinent problems on file. She  has past surgical history that includes Vaginal hysterectomy; Cholecystectomy; Knee arthroscopy (Left); Colonoscopy (06/06/2005); and Esophagogastroduodenoscopy (06/06/2005). Her family history includes Cancer in her mother and sister; GER disease in her mother; Gallstones in her mother; Heart disease in her maternal grandfather and mother; Heart disease (age of onset: 40) in her maternal  grandmother; Hiatal hernia in her mother; Hypertension in her daughter, mother, paternal grandfather, and paternal grandmother; Rheum arthritis in her father; Stroke in her paternal grandfather and paternal grandmother. There is no history of Colon cancer, Esophageal cancer, Pancreatic cancer, Liver disease, or Kidney disease. She  reports that she quit smoking about 11 years ago. Her smoking use included Cigarettes. She smoked 0.00 packs per day for 33 years. She has never used smokeless tobacco. She reports that she drinks alcohol. She reports that she does not use illicit drugs. She has a current medication list which includes the following prescription(s): alprazolam, ampicillin, aspirin, carvedilol, vitamin d, cholestyramine, furosemide, glimepiride, glucose blood, hyoscyamine sulfate, lisinopril, omega-3 fatty acids, ondansetron, simvastatin, spironolactone, and turmeric. Current Outpatient Prescriptions on File Prior to Visit  Medication Sig Dispense Refill  . ALPRAZolam (XANAX) 0.25 MG tablet Take 1/2 tablet by mouth every morning      . ampicillin (PRINCIPEN) 250 MG capsule Take 1 capsule (250 mg total) by mouth 4 (four) times daily.  28 capsule  3  . aspirin 81 MG tablet Take 81 mg by mouth daily.      . carvedilol (COREG) 6.25 MG tablet Take 2 tablets (12.5 mg total) by mouth 2 (two) times daily with a meal. TAKING 3 TABLETS EACH DOSE BID Dr Ola Spurr      . Cholecalciferol (VITAMIN D) 2000 UNITS CAPS Take by mouth.      . cholestyramine (QUESTRAN) 4 G packet Take 1 packet (4 g total) by mouth  2 (two) times daily.  60 each  1  . furosemide (LASIX) 20 MG tablet Take 1 tablet (20 mg total) by mouth daily. Dr Ola Spurr  30 tablet    . glimepiride (AMARYL) 2 MG tablet Take 1 tablet (2 mg total) by mouth daily before breakfast.  90 tablet  3  . glucose blood (ONE TOUCH TEST STRIPS) test strip 1 each by Other route as needed. Use as instructed      . Hyoscyamine Sulfate 0.375 MG TBCR Take 1  tablet (0.375 mg total) by mouth 2 (two) times daily.  60 tablet  1  . lisinopril (PRINIVIL,ZESTRIL) 20 MG tablet Take 1 tablet (20 mg total) by mouth daily. Office visit due now  90 tablet  0  . Omega-3 Fatty Acids (OMEGA-3 FISH OIL PO) Take 1 tablet by mouth daily.      . ondansetron (ZOFRAN) 4 MG tablet Take 1 tablet (4 mg total) by mouth every 8 (eight) hours as needed for nausea or vomiting.  30 tablet  1  . simvastatin (ZOCOR) 40 MG tablet TAKE 1 TABLET BY MOUTH IN THE EVENING--30 only patient is due for labs  30 tablet  0  . spironolactone (ALDACTONE) 25 MG tablet Take 1 tablet (25 mg total) by mouth every other day. Dr Ola Spurr      . Turmeric 500 MG CAPS Take 1 capsule by mouth daily.       No current facility-administered medications on file prior to visit.   She is allergic to prednisone; codeine; and ibuprofen..  Review of Systems Review of Systems  Constitutional: Negative for activity change, appetite change and fatigue.  HENT: Negative for hearing loss, congestion, tinnitus and ear discharge.  dentist -due Eyes: Negative for visual disturbance (see optho -due)  Respiratory: Negative for cough, chest tightness and shortness of breath.   Cardiovascular: Negative for chest pain, palpitations and leg swelling.  Gastrointestinal: Negative for abdominal pain, diarrhea, constipation and abdominal distention.  Genitourinary: Negative for urgency, frequency, decreased urine volume and difficulty urinating.  Musculoskeletal: Negative for back pain, arthralgias and gait problem.  Skin: Negative for color change, pallor and rash.  Neurological: Negative for dizziness, light-headedness, numbness and headaches.  Hematological: Negative for adenopathy. Does not bruise/bleed easily.  Psychiatric/Behavioral: Negative for suicidal ideas, confusion, sleep disturbance, self-injury, dysphoric mood, decreased concentration and agitation.       Objective:    BP 100/68  Pulse 62   Temp(Src) 98.9 F (37.2 C) (Oral)  Ht 5\' 3"  (1.6 m)  Wt 189 lb (85.73 kg)  BMI 33.49 kg/m2  SpO2 96% General appearance: alert, cooperative, appears stated age and no distress Head: Normocephalic, without obvious abnormality, atraumatic Eyes: negative findings: lids and lashes normal and pupils equal, round, reactive to light and accomodation Ears: normal TM's and external ear canals both ears Nose: Nares normal. Septum midline. Mucosa normal. No drainage or sinus tenderness. Throat: lips, mucosa, and tongue normal; teeth and gums normal Neck: no adenopathy, no carotid bruit, no JVD, supple, symmetrical, trachea midline and thyroid not enlarged, symmetric, no tenderness/mass/nodules Back: symmetric, no curvature. ROM normal. No CVA tenderness. Lungs: clear to auscultation bilaterally Breasts: normal appearance, no masses or tenderness Heart: S1, S2 normal Abdomen: soft, non-tender; bowel sounds normal; no masses,  no organomegaly Pelvic: not indicated; post-menopausal, no abnormal Pap smears in past Extremities: extremities normal, atraumatic, no cyanosis or edema Pulses: 2+ and symmetric Skin: Skin color, texture, turgor normal. No rashes or lesions Lymph nodes: Cervical, supraclavicular, and axillary  nodes normal. Neurologic: Alert and oriented X 3, normal strength and tone. Normal symmetric reflexes. Normal coordination and gait Psych- no depression, no anxiety     Sensory exam of the foot is normal, tested with the monofilament. Good pulses, no lesions or ulcers, good peripheral pulses.  Assessment:    Healthy female exam.      Plan:     ghm utd Check labs  See After Visit Summary for Counseling Recommendations   1. HTN (hypertension) Stable, con't meds - POCT urinalysis dipstick - TSH - Microalbumin / creatinine urine ratio - Magnesium; Future - lisinopril (PRINIVIL,ZESTRIL) 20 MG tablet; Take 1 tablet (20 mg total) by mouth daily. Office visit due now  Dispense: 90  tablet; Refill: 0  2. Other and unspecified hyperlipidemia con't meds Check labs - Hepatic function panel - Lipid panel - POCT urinalysis dipstick - TSH - Microalbumin / creatinine urine ratio - Magnesium; Future - simvastatin (ZOCOR) 40 MG tablet; TAKE 1 TABLET BY MOUTH IN THE EVENING-  Dispense: 90 tablet; Refill: 3  3. Type II or unspecified type diabetes mellitus with peripheral circulatory disorders, uncontrolled(250.72) Check labs  con't meds - Basic metabolic panel - CBC with Differential - Hemoglobin A1c - POCT urinalysis dipstick - TSH - Microalbumin / creatinine urine ratio - Magnesium; Future  4. Type II or unspecified type diabetes mellitus without mention of complication, not stated as uncontrolled   - glimepiride (AMARYL) 2 MG tablet; Take 1 tablet (2 mg total) by mouth daily before breakfast.  Dispense: 90 tablet; Refill: 3  5. Generalized anxiety disorder  - ALPRAZolam (XANAX) 0.25 MG tablet; Take 1/2 tablet by mouth every morning  Dispense: 30 tablet; Refill: 3  6. Osteoarthritis  - HYDROcodone-acetaminophen (NORCO/VICODIN) 5-325 MG per tablet; Take 1 tablet by mouth every 6 (six) hours as needed for moderate pain.  Dispense: 30 tablet; Refill: 0  7. Preventative health care

## 2013-11-20 NOTE — Telephone Encounter (Signed)
Ok.  I guess let's just see how things go with that for now.  Thank you,  Jess

## 2013-11-20 NOTE — Telephone Encounter (Signed)
Patient notified of recommendations. Cancelled ultrasound. She is tolerating the antibiotic that Dr. [name] gave her.

## 2013-11-20 NOTE — Telephone Encounter (Signed)
Patient called to return your phone call. Please call patient back on her work line [phone]

## 2013-11-20 NOTE — Progress Notes (Signed)
Pre visit review using our clinic review tool, if applicable. No additional management support is needed unless otherwise documented below in the visit note. 

## 2013-11-20 NOTE — Telephone Encounter (Signed)
Message copied by Hulan Saas on Fri Nov 20, 2013  2:27 PM ------      Message from: Alonza Bogus D      Created: Fri Nov 20, 2013  2:16 PM      Regarding: Mistake on labs       Please let patient know that I apologize but I made a mistake when looking at her labs and must have confused them with someone else.  No liver test abnormalities (did not even check liver tests).  Can cancel ultrasound.  But let's try that gluten free diet since I was correct about her celiac labs being indeterminate.  She has a follow-up with Dr. [name] already and had her EGD this week, which was ok.  I think that he gave her an antibiotic or something for small intestinal bacterial overgrowth/IBS so if he did then let's find out how she is feeling on that.            Thank you,            Jess ------

## 2013-11-23 NOTE — Telephone Encounter (Signed)
Unable to reach patient pre visit.  

## 2013-11-24 ENCOUNTER — Ambulatory Visit (HOSPITAL_COMMUNITY): Payer: BC Managed Care – PPO

## 2013-11-24 NOTE — Addendum Note (Signed)
Addended by: Harl Bowie on: 11/24/2013 05:05 PM   Modules accepted: Orders

## 2013-11-25 LAB — POCT URINALYSIS DIPSTICK
BILIRUBIN UA: NEGATIVE
Glucose, UA: NEGATIVE
KETONES UA: NEGATIVE
LEUKOCYTES UA: NEGATIVE
Nitrite, UA: POSITIVE
PH UA: 5
Protein, UA: NEGATIVE
RBC UA: NEGATIVE
Spec Grav, UA: 1.015
Urobilinogen, UA: 0.2

## 2013-11-27 LAB — URINE CULTURE

## 2013-11-30 ENCOUNTER — Telehealth: Payer: Self-pay | Admitting: Family Medicine

## 2013-11-30 NOTE — Telephone Encounter (Signed)
Spoke with patient and she stated she has been using sweet oil and warm water in her ear and now she can not hear out of it. She wants to know what she should do. The patient has bene using q-tips to try to get the wax out but has not had any success. She wants to know what she should do. Please advise     KP

## 2013-11-30 NOTE — Telephone Encounter (Signed)
Caller name:Khloie Rausch  Relation to JG:[id number] Call back number: [phone] Pharmacy:  Reason for call: patient called to speak with Maudie Mercury stating that she can not hear out of her right ear. Also patient stated that Maudie Mercury cleaned out her ears last week and wasn't sure if there is something she can do from home. Please advise.

## 2013-11-30 NOTE — Telephone Encounter (Signed)
Patient is using sweet oil in her ear. Apt scheduled     KP

## 2013-11-30 NOTE — Telephone Encounter (Signed)
Use colace or peroxide in ear and keep appointment for irrigation

## 2013-12-01 ENCOUNTER — Ambulatory Visit (INDEPENDENT_AMBULATORY_CARE_PROVIDER_SITE_OTHER): Payer: BC Managed Care – PPO | Admitting: *Deleted

## 2013-12-01 ENCOUNTER — Other Ambulatory Visit: Payer: Self-pay | Admitting: Family Medicine

## 2013-12-01 ENCOUNTER — Other Ambulatory Visit: Payer: Self-pay

## 2013-12-01 ENCOUNTER — Telehealth: Payer: Self-pay | Admitting: Family Medicine

## 2013-12-01 DIAGNOSIS — H9190 Unspecified hearing loss, unspecified ear: Secondary | ICD-10-CM

## 2013-12-01 DIAGNOSIS — H6121 Impacted cerumen, right ear: Secondary | ICD-10-CM

## 2013-12-01 DIAGNOSIS — H6120 Impacted cerumen, unspecified ear: Secondary | ICD-10-CM

## 2013-12-01 MED ORDER — CIPROFLOXACIN HCL 500 MG PO TABS: 500.0000 mg | ORAL_TABLET | Freq: Two times a day (BID) | ORAL | Status: DC

## 2013-12-01 NOTE — Telephone Encounter (Signed)
Patient still can not hear. Please advise    KP

## 2013-12-01 NOTE — Telephone Encounter (Signed)
Patient called and stated that she would like to be referred to ENT. Please advise.

## 2013-12-01 NOTE — Patient Instructions (Signed)
Irrigated pts ear with warm water, was able to remove only very small pieces of ear wax fbe removed rom the ear. Patient states she still has fullness and a "buzzing sound" in the right ear. Patient has been using sweet oil and warm water on a cotton ball that she places in the ear canal at bedtime but without any results on wax coming out. On visual exam, I was able to see hardened wax deep in the ear canal that ws unable to be removed. Patient is questioning if she needs to be seen by [facility] or a specialist for this. Please advise of next steps for this patient and the excessive wax build up in her right ear that is unable to be removed with irrigation.

## 2013-12-01 NOTE — Progress Notes (Signed)
Pre-visit discussion using our clinic review tool. No additional management support is needed unless otherwise documented below in the visit note.  

## 2013-12-01 NOTE — Telephone Encounter (Signed)
Referral put in.

## 2013-12-08 ENCOUNTER — Other Ambulatory Visit: Payer: Self-pay | Admitting: Family Medicine

## 2013-12-08 NOTE — Telephone Encounter (Signed)
Patient is now under Dr.Kaplan's care for GI, I will forward the request to him.      KP

## 2013-12-14 ENCOUNTER — Telehealth: Payer: Self-pay | Admitting: Gastroenterology

## 2013-12-14 NOTE — Telephone Encounter (Signed)
Patient has questions about Dr [name] holding her Protonix and Tramadol. She thought she was suppose to discontinue. She was upset that it is taking her so long to get back into see Dr [name]. She like Janett Billow and wants to follow up with Janett Billow. Scheduled pt with PA 12/17/2013 at 2:45pm

## 2013-12-15 ENCOUNTER — Encounter: Payer: Self-pay | Admitting: *Deleted

## 2013-12-17 ENCOUNTER — Encounter: Payer: Self-pay | Admitting: Gastroenterology

## 2013-12-17 ENCOUNTER — Ambulatory Visit (INDEPENDENT_AMBULATORY_CARE_PROVIDER_SITE_OTHER): Payer: BC Managed Care – PPO | Admitting: Gastroenterology

## 2013-12-17 VITALS — BP 78/50 | HR 76 | Ht 62.5 in | Wt 189.4 lb

## 2013-12-17 DIAGNOSIS — R110 Nausea: Secondary | ICD-10-CM

## 2013-12-17 DIAGNOSIS — R109 Unspecified abdominal pain: Secondary | ICD-10-CM

## 2013-12-17 MED ORDER — HYOSCYAMINE SULFATE 0.125 MG SL SUBL: 0.1250 mg | SUBLINGUAL_TABLET | Freq: Two times a day (BID) | SUBLINGUAL | Status: DC

## 2013-12-17 MED ORDER — ONDANSETRON HCL 4 MG PO TABS: 4.0000 mg | ORAL_TABLET | Freq: Three times a day (TID) | ORAL | Status: DC | PRN

## 2013-12-17 NOTE — Patient Instructions (Addendum)
We have sent the following medications to your pharmacy for you to pick up at your convenience: Zofran  Levsin   Follow up visit with Dr. [name] is scheduled for 03-04-2014 at 85 am.  If you your symptoms come back or get worse please call the office.

## 2013-12-17 NOTE — Progress Notes (Signed)
     12/17/2013 Beth Shepard [id number] 10/09/1955   History of Present Illness:  This is a 58 year old female who is known to Dr. [name].  Most recently she was seen by myself in the office on 5/22 (please see my office note for more details regarding her history).  At that time she was scheduled for an EGD.  I had also increased her pantoprazole from 20 mg daily to 40 mg daily.  Her EGD on 5/26 revealed fundic gland polyps but was otherwise normal.  She was told to discontinue her pantoprazole and her tramadol.  She was also placed on a course of ampicillin for SIBO.  She comes in today mostly for clarification regarding medication because she got a call from the pharmacy saying that a prescription for pantoprazole was renewed and waiting for her.  I explained to her that it must have been a mistake and that she was to remain off of it for now.  She says that overall she feels much better.  She is still taking the zofran, sometimes once a day, but was previously requiring multiple times a day.  She is taking the [place] twice a day (I had given this to her at her last visit as well for diarrhea), and says that it is helping her bowel movements; says that she is actually having more regular BM's now.  She still has some abdominal cramping and takes the Hyomax BID, which she needs refilled.    Last colonoscopy was in 05/2005 by Dr. Sharlett Iles and was normal at that time (had a very long, redundant colon).   Current Medications, Allergies, Past Medical History, Past Surgical History, Family History and Social History were reviewed in [employer] record.   Physical Exam: BP 78/50  Pulse 76  Ht 5' 2.5" (1.588 m)  Wt 189 lb 6 oz (85.9 kg)  BMI 34.06 kg/m2 General: Well developed white female in no acute distress Head: Normocephalic and atraumatic Eyes:  Sclerae anicteric, conjunctiva pink  Ears: Normal auditory acuity Lungs: Clear throughout to auscultation Heart:  Regular rate and rhythm Abdomen: Soft, non-distended.  Normal bowel sounds.  Mild diffuse TTP without R/R/G. Musculoskeletal: Symmetrical with no gross deformities  Extremities: No edema  Neurological: Alert oriented x 4, grossly non-focal Psychological:  Alert and cooperative. Normal mood and affect  Assessment and Recommendations: -Nausea:  Improved after discontinuing pantoprazole and Tramadol.  Will continue zofran prn for now (will refill).  ? If the vicodin could be causing some nausea as well.  Continue off of pantoprazole. -Abdominal pain:  Still with some cramping, but overall her symptoms are much improved after empiric treatment of SIBO with ampicillin.  Continue Hyomax BID prn (will refill).    *Follow-up in 4-6 weeks with Dr. [name].  If symptoms worsen again then may want to consider repeat colonoscopy with biopsies to rule out microscopic colitis, etc.  Has not had any other abdominal imaging in quite some time so that may want to be considered as well if symptoms continue.

## 2013-12-18 NOTE — Progress Notes (Signed)
Reviewed and agree with management. Robert D. Kaplan, M.D., FACG  

## 2013-12-30 ENCOUNTER — Telehealth: Payer: Self-pay | Admitting: *Deleted

## 2013-12-30 NOTE — Telephone Encounter (Signed)
[place] for #30 Vicodin

## 2013-12-30 NOTE — Telephone Encounter (Signed)
Please advise 

## 2013-12-30 NOTE — Telephone Encounter (Signed)
Spoke with patient who states that she has another refill on the Xanax for now. She states she will wait on that until Dr Etter Sjogren returns. Explained to patient that insurance will only cover what the Rx is written for so that it why she is only receiving #15 since she only takes a 1/2 daily.   Please advise on the Vicodin if we can fill?

## 2013-12-30 NOTE — Telephone Encounter (Signed)
Caller name:  [name] Relation to pt:  self Call back number: [phone]  Pharmacy:  [facility] in [place], [place]  Reason for call: Pt called to get refill on  ALPRAZolam Duanne Moron) 0.25 MG tablet  Last filled 11/20/13, #30, 3 refills Last OV 11/20/13  Pt states with the way the last prescription was written, pharmacy has been giving her 15 tablets for one month due to insurance not paying for 60 tablets at a time.  She says Etter Sjogren has written before for 90 tablets to take as needed and insurance will pay for that.  Also requesting refill for  HYDROcodone-acetaminophen (NORCO/VICODIN) 5-325 MG per tablet Last filled 11/20/13, #30, no refills Last OV 11/20/13

## 2013-12-30 NOTE — Telephone Encounter (Signed)
Ok for #30 of each or can wait until Monday when PCP returns to approve 90.

## 2013-12-31 MED ORDER — HYDROCODONE-ACETAMINOPHEN 5-325 MG PO TABS: 1.0000 | ORAL_TABLET | Freq: Four times a day (QID) | ORAL | Status: DC | PRN

## 2013-12-31 NOTE — Telephone Encounter (Signed)
Med filled and pt notified.  

## 2014-01-07 ENCOUNTER — Telehealth: Payer: Self-pay

## 2014-01-07 DIAGNOSIS — F411 Generalized anxiety disorder: Secondary | ICD-10-CM

## 2014-01-07 NOTE — Telephone Encounter (Signed)
Call from the patient and she was taking Xanax 0.25 BID and her cardiologist recommended tat Dr.Lowne increase to 1 1/2 BID #90 but instead Dr.Lowne gave her a script for 1/2 a day #30. She said she is running of med's because the quantity is incorrect and she would like to know if we could correct the script to reflect the 1 1/2 BID quantity 90 to last her 30 days. Please advise      KP

## 2014-01-08 NOTE — Telephone Encounter (Signed)
Spoke with patient and advised that it would be addressed on Dr Nonda Lou return. Verbalized understanding.

## 2014-01-08 NOTE — Telephone Encounter (Signed)
LM for patient to call back. Need to advise patient that the request would be addressed once PCP is in the office.

## 2014-01-13 ENCOUNTER — Ambulatory Visit: Payer: BC Managed Care – PPO | Admitting: Gastroenterology

## 2014-01-18 MED ORDER — ALPRAZOLAM 0.25 MG PO TABS: ORAL_TABLET | ORAL | Status: DC

## 2014-01-18 NOTE — Telephone Encounter (Signed)
Vm left advising patient that the Rx will be faxed       KP

## 2014-01-18 NOTE — Addendum Note (Signed)
Addended by: Ewing Schlein on: 01/18/2014 01:49 PM   Modules accepted: Orders

## 2014-01-18 NOTE — Telephone Encounter (Signed)
Ok to change to 1 1/2 bid #90 but in future come here for inc in med

## 2014-03-01 ENCOUNTER — Other Ambulatory Visit: Payer: Self-pay | Admitting: Family Medicine

## 2014-03-04 ENCOUNTER — Ambulatory Visit: Payer: BC Managed Care – PPO | Admitting: Gastroenterology

## 2014-03-08 ENCOUNTER — Telehealth: Payer: Self-pay | Admitting: Family Medicine

## 2014-03-08 MED ORDER — HYDROCODONE-ACETAMINOPHEN 5-325 MG PO TABS: 1.0000 | ORAL_TABLET | Freq: Four times a day (QID) | ORAL | Status: DC | PRN

## 2014-03-08 NOTE — Telephone Encounter (Signed)
Caller name: [name] Relation to pt: Call back [phone]   Reason for call:  Pt is wanting a refill on Rx HYDROcodone-acetaminophen (NORCO/VICODIN) 5-325 MG per tablet

## 2014-03-08 NOTE — Telephone Encounter (Signed)
Last seen 11/20/13 and filled 12/31/13 #30 UDS 10/23/13   Please advise     KP

## 2014-03-08 NOTE — Telephone Encounter (Signed)
Refill x1 

## 2014-03-08 NOTE — Telephone Encounter (Signed)
Patient aware Rx for pick up.    KP

## 2014-06-28 ENCOUNTER — Telehealth: Payer: Self-pay | Admitting: Family Medicine

## 2014-06-28 DIAGNOSIS — F411 Generalized anxiety disorder: Secondary | ICD-10-CM

## 2014-06-28 MED ORDER — ALPRAZOLAM 0.25 MG PO TABS: ORAL_TABLET | ORAL | Status: DC

## 2014-06-28 MED ORDER — HYDROCODONE-ACETAMINOPHEN 5-325 MG PO TABS: 1.0000 | ORAL_TABLET | Freq: Four times a day (QID) | ORAL | Status: DC | PRN

## 2014-06-28 NOTE — Telephone Encounter (Signed)
Last seen 10/31/13 and filled Xanax  01/18/14 #90 with 1 refill Hydrocodone 03/08/14 #30 UDS 10/23/13 No risk selected   Please advise      KP

## 2014-06-28 NOTE — Telephone Encounter (Signed)
Xanax faxed and hydrocodone ready for pick up.      KP

## 2014-06-28 NOTE — Telephone Encounter (Signed)
Caller name: [place] Relation to pt: self Call back number:  [phone] Pharmacy: [facility] in [place]  Reason for call:   Patient requesting a new hydrocodone rx and xanax

## 2014-06-28 NOTE — Telephone Encounter (Signed)
Ok refill x1-- pt needs ov

## 2014-08-31 ENCOUNTER — Other Ambulatory Visit: Payer: Self-pay | Admitting: Family Medicine

## 2014-09-07 ENCOUNTER — Encounter: Payer: Self-pay | Admitting: Family Medicine

## 2014-09-07 ENCOUNTER — Other Ambulatory Visit: Payer: Self-pay

## 2014-09-07 ENCOUNTER — Ambulatory Visit (INDEPENDENT_AMBULATORY_CARE_PROVIDER_SITE_OTHER): Payer: BLUE CROSS/BLUE SHIELD | Admitting: Family Medicine

## 2014-09-07 VITALS — BP 132/72 | HR 92 | Temp 99.3°F | Wt 199.0 lb

## 2014-09-07 DIAGNOSIS — E119 Type 2 diabetes mellitus without complications: Secondary | ICD-10-CM

## 2014-09-07 DIAGNOSIS — F411 Generalized anxiety disorder: Secondary | ICD-10-CM

## 2014-09-07 DIAGNOSIS — Z23 Encounter for immunization: Secondary | ICD-10-CM

## 2014-09-07 DIAGNOSIS — E785 Hyperlipidemia, unspecified: Secondary | ICD-10-CM

## 2014-09-07 DIAGNOSIS — K219 Gastro-esophageal reflux disease without esophagitis: Secondary | ICD-10-CM

## 2014-09-07 DIAGNOSIS — I10 Essential (primary) hypertension: Secondary | ICD-10-CM

## 2014-09-07 LAB — POCT URINALYSIS DIPSTICK
Bilirubin, UA: NEGATIVE
Blood, UA: NEGATIVE
Glucose, UA: NEGATIVE
Ketones, UA: NEGATIVE
LEUKOCYTES UA: NEGATIVE
Nitrite, UA: NEGATIVE
PH UA: 5.5
PROTEIN UA: NEGATIVE
Spec Grav, UA: 1.025
Urobilinogen, UA: 4

## 2014-09-07 MED ORDER — PANTOPRAZOLE SODIUM 20 MG PO TBEC: 20.0000 mg | DELAYED_RELEASE_TABLET | Freq: Every day | ORAL | Status: DC

## 2014-09-07 MED ORDER — LISINOPRIL 20 MG PO TABS: 20.0000 mg | ORAL_TABLET | Freq: Every day | ORAL | Status: DC

## 2014-09-07 MED ORDER — SIMVASTATIN 40 MG PO TABS: ORAL_TABLET | ORAL | Status: DC

## 2014-09-07 MED ORDER — GLIMEPIRIDE 2 MG PO TABS: 2.0000 mg | ORAL_TABLET | Freq: Every day | ORAL | Status: DC

## 2014-09-07 MED ORDER — ALPRAZOLAM 0.25 MG PO TABS: ORAL_TABLET | ORAL | Status: DC

## 2014-09-07 MED ORDER — HYDROCODONE-ACETAMINOPHEN 5-325 MG PO TABS: 1.0000 | ORAL_TABLET | Freq: Four times a day (QID) | ORAL | Status: DC | PRN

## 2014-09-07 NOTE — Progress Notes (Signed)
Patient ID: Beth Shepard, female    DOB: 06/15/1956  Age: 59 y.o. MRN: [id number]    Subjective:  Subjective HPI Beth Shepard presents for f/u DM, htn, lipids and pt seeing orth for knees and back.    HPI HYPERTENSION  Blood pressure range-not checking   Chest pain- no      Dyspnea- no Lightheadedness- no   Edema- no Other side effects - no   Medication compliance: fair Low salt diet- yes  DIABETES  Blood Sugar ranges-130-150  Polyuria- no New Visual problems- no Hypoglycemic symptoms- no Other side effects-no Medication compliance - fair Last eye exam- due Foot exam- today  HYPERLIPIDEMIA  Medication compliance- good RUQ pain- no  Muscle aches- no Other side effects-no   Review of Systems  Constitutional: Negative for activity change, appetite change, fatigue and unexpected weight change.  Respiratory: Negative for cough and shortness of breath.   Cardiovascular: Negative for chest pain and palpitations.  Psychiatric/Behavioral: Negative for behavioral problems and dysphoric mood. The patient is not nervous/anxious.    History   Social History  . Marital Status: Widowed    Spouse Name: N/A  . Number of Children: 2  . Years of Education: N/A   Occupational History  . Administive     Social History Main Topics  . Smoking status: Former Smoker -- 33 years    Types: Cigarettes    Quit date: 06/25/2002  . Smokeless tobacco: Never Used  . Alcohol Use: Yes     Comment: rare  . Drug Use: No  . Sexual Activity:    Partners: Male   Other Topics Concern  . Not on file   Social History Narrative   Limited exercise per cardiology    H":   istory Past Medical History  Diagnosis Date  . COPD (chronic obstructive pulmonary disease)   . Diabetes mellitus     type 2  . GERD (gastroesophageal reflux disease)     chronic  . Hyperlipidemia   . Hypertension   . Anxiety   . Shingles   . LBBB (left bundle branch block)     Dr. Gwenith Spitz, [facility]  Cardiology  . Congestive heart failure   . Gastric polyps     She has past surgical history that includes Vaginal hysterectomy; Cholecystectomy; Knee arthroscopy (Left); Colonoscopy (06/06/2005); and Esophagogastroduodenoscopy (06/06/2005, 11/17/2013).   Her family history includes Cancer in her sister; GER disease in her mother; Gallstones in her mother; Heart disease in her maternal grandfather and mother; Heart disease (age of onset: 61) in her maternal grandmother; Hiatal hernia in her mother; Hypertension in her daughter, mother, paternal grandfather, and paternal grandmother; Lymphoma in her mother; Rheum arthritis in her father; Stroke in her paternal grandfather and paternal grandmother. There is no history of Colon cancer, Esophageal cancer, Pancreatic cancer, Liver disease, or Kidney disease.She reports that she quit smoking about 12 years ago. Her smoking use included Cigarettes. She quit after 33 years of use. She has never used smokeless tobacco. She reports that she drinks alcohol. She reports that she does not use illicit drugs.  Current Outpatient Prescriptions on File Prior to Visit  Medication Sig Dispense Refill  . aspirin 81 MG tablet Take 81 mg by mouth daily.    . furosemide (LASIX) 20 MG tablet Take 1 tablet (20 mg total) by mouth daily. Dr Ola Spurr 30 tablet   . glucose blood (ONE TOUCH TEST STRIPS) test strip 1 each by Other route as needed. Use as instructed    .  HYDROcodone-acetaminophen (NORCO/VICODIN) 5-325 MG per tablet Take 1 tablet by mouth every 6 (six) hours as needed for moderate pain. 30 tablet 0  . Hyoscyamine Sulfate 0.375 MG TBCR Take 1 tablet (0.375 mg total) by mouth 2 (two) times daily. 60 tablet 1  . ondansetron (ZOFRAN) 4 MG tablet Take 1 tablet (4 mg total) by mouth every 8 (eight) hours as needed for nausea or vomiting. 30 tablet 1  . spironolactone (ALDACTONE) 25 MG tablet Take 1 tablet (25 mg total) by mouth every other day. Dr Ola Spurr     No  current facility-administered medications on file prior to visit.     Objective:  Objective Physical Exam  Constitutional: She is oriented to person, place, and time. She appears well-developed and well-nourished. No distress.  HENT:  Right Ear: External ear normal.  Left Ear: External ear normal.  Nose: Nose normal.  Mouth/Throat: Oropharynx is clear and moist.  Eyes: EOM are normal. Pupils are equal, round, and reactive to light.  Neck: Normal range of motion. Neck supple.  Cardiovascular: Normal rate, regular rhythm and normal heart sounds.   No murmur heard. Pulmonary/Chest: Effort normal and breath sounds normal. No respiratory distress. She has no wheezes. She has no rales. She exhibits no tenderness.  Neurological: She is alert and oriented to person, place, and time.  Psychiatric: She has a normal mood and affect. Her behavior is normal. Judgment and thought content normal.  Sensory exam of the foot is normal, tested with the monofilament. Good pulses, no lesions or ulcers, good peripheral pulses.  BP 132/72 mmHg  Pulse 92  Temp(Src) 99.3 F (37.4 C) (Oral)  Wt 199 lb (90.266 kg)  SpO2 94% Wt Readings from Last 3 Encounters:  09/07/14 199 lb (90.266 kg)  12/17/13 189 lb 6 oz (85.9 kg)  11/20/13 189 lb (85.73 kg)     Lab Results  Component Value Date   WBC 7.1 11/13/2013   HGB 11.5* 11/13/2013   HCT 34.0* 11/13/2013   PLT 323.0 11/13/2013   GLUCOSE 122* 11/13/2013   CHOL 131 11/13/2012   TRIG 130.0 11/13/2012   HDL 40.70 11/13/2012   LDLDIRECT 110.3 03/17/2009   LDLCALC 64 11/13/2012   ALT 11 10/21/2013   AST 17 10/21/2013   NA 139 11/13/2013   K 4.9 11/13/2013   CL 101 11/13/2013   CREATININE 1.4* 11/13/2013   BUN 31* 11/13/2013   CO2 30 11/13/2013   TSH 0.890 10/21/2013   HGBA1C 6.1 11/13/2012   MICROALBUR 0.4 11/13/2012    Nm Gastric Emptying  11/03/2013   CLINICAL DATA:  Abdominal pain with nausea and vomiting  EXAM: NUCLEAR MEDICINE GASTRIC  EMPTYING SCAN  Views: LAO stomach  Radionuclide: Technetium 99 M sulfur colloid in oatmeal  Dose:  2.1 mCi  Route of administration: Oral  COMPARISON:  None.  FINDINGS: Images and count statistics were obtained serially over the stomach for a 2 hr time span. After 1 hr, 89% of solid material had emptied from the stomach. After 2 hr, 94% of solid material had emptied from the stomach. These values are within normal limits.  IMPRESSION: Normal gastric emptying study.   Electronically Signed   By: Lowella Grip M.D.   On: 11/03/2013 12:38     Assessment & Plan:  Plan I have discontinued Beth Shepard's Vitamin D, Turmeric, Omega-3 Fatty Acids (OMEGA-3 FISH OIL PO), cholestyramine, ciprofloxacin, and hyoscyamine. I have also changed her lisinopril and pantoprazole. Additionally, I am having her maintain her glucose blood,  aspirin, spironolactone, furosemide, Hyoscyamine Sulfate, ondansetron, HYDROcodone-acetaminophen, carvedilol, ALPRAZolam, glimepiride, and simvastatin.  Meds ordered this encounter  Medications  . carvedilol (COREG) 12.5 MG tablet    Sig: Take 1 tablet by mouth 2 (two) times daily.  Marland Kitchen DISCONTD: pantoprazole (PROTONIX) 20 MG tablet    Sig: Take 1 tablet by mouth daily.  Marland Kitchen ALPRAZolam (XANAX) 0.25 MG tablet    Sig: Take 1 1/2 tablet by mouth every morning    Dispense:  90 tablet    Refill:  0  . glimepiride (AMARYL) 2 MG tablet    Sig: Take 1 tablet (2 mg total) by mouth daily before breakfast.    Dispense:  90 tablet    Refill:  3  . lisinopril (PRINIVIL,ZESTRIL) 20 MG tablet    Sig: Take 1 tablet (20 mg total) by mouth daily.    Dispense:  90 tablet    Refill:  1  . simvastatin (ZOCOR) 40 MG tablet    Sig: TAKE 1 TABLET BY MOUTH IN THE EVENING-    Dispense:  90 tablet    Refill:  3  . pantoprazole (PROTONIX) 20 MG tablet    Sig: Take 1 tablet (20 mg total) by mouth daily.    Dispense:  90 tablet    Refill:  3    Problem List Items Addressed This Visit       Unprioritized   GERD   Relevant Medications   pantoprazole (PROTONIX) EC tablet    Other Visit Diagnoses    Generalized anxiety disorder    -  Primary    Relevant Medications    ALPRAZolam  (XANAX) tablet    Other Relevant Orders    Basic metabolic panel    Hemoglobin A1c    Type 2 diabetes mellitus without complication        Relevant Medications    glimepiride (AMARYL) tablet    lisinopril (PRINIVIL,ZESTRIL) tablet    simvastatin (ZOCOR) tablet    Other Relevant Orders    Microalbumin / creatinine urine ratio    POCT urinalysis dipstick    Hemoglobin A1c    Hyperlipemia        Relevant Medications    carvedilol (COREG) 12.5 MG tablet    lisinopril (PRINIVIL,ZESTRIL) tablet    simvastatin (ZOCOR) tablet    Other Relevant Orders    Hepatic function panel    Lipid panel    POCT urinalysis dipstick    Hemoglobin A1c    Essential hypertension        Relevant Medications    carvedilol (COREG) 12.5 MG tablet    lisinopril (PRINIVIL,ZESTRIL) tablet    simvastatin (ZOCOR) tablet       Follow-up: Return in about 6 months (around 03/10/2015), or if symptoms worsen or fail to improve, for f/u and labs.  Garnet Koyanagi, DO

## 2014-09-07 NOTE — Patient Instructions (Signed)
Diabetes and Standards of Medical Care Diabetes is complicated. You may find that your diabetes team includes a dietitian, nurse, diabetes educator, eye doctor, and more. To help everyone know what is going on and to help you get the care you deserve, the following schedule of care was developed to help keep you on track. Below are the tests, exams, vaccines, medicines, education, and plans you will need. HbA1c test This test shows how well you have controlled your glucose over the past 2-3 months. It is used to see if your diabetes management plan needs to be adjusted.   It is performed at least 2 times a year if you are meeting treatment goals.  It is performed 4 times a year if therapy has changed or if you are not meeting treatment goals. Blood pressure test  This test is performed at every routine medical visit. The goal is less than 140/90 mm Hg for most people, but 130/80 mm Hg in some cases. Ask your health care provider about your goal. Dental exam  Follow up with the dentist regularly. Eye exam  If you are diagnosed with type 1 diabetes as a child, get an exam upon reaching the age of 37 years or older and have had diabetes for 3-5 years. Yearly eye exams are recommended after that initial eye exam.  If you are diagnosed with type 1 diabetes as an adult, get an exam within 5 years of diagnosis and then yearly.  If you are diagnosed with type 2 diabetes, get an exam as soon as possible after the diagnosis and then yearly. Foot care exam  Visual foot exams are performed at every routine medical visit. The exams check for cuts, injuries, or other problems with the feet.  A comprehensive foot exam should be done yearly. This includes visual inspection as well as assessing foot pulses and testing for loss of sensation.  Check your feet nightly for cuts, injuries, or other problems with your feet. Tell your health care provider if anything is not healing. Kidney function test (urine  microalbumin)  This test is performed once a year.  Type 1 diabetes: The first test is performed 5 years after diagnosis.  Type 2 diabetes: The first test is performed at the time of diagnosis.  A serum creatinine and estimated glomerular filtration rate (eGFR) test is done once a year to assess the level of chronic kidney disease (CKD), if present. Lipid profile (cholesterol, HDL, LDL, triglycerides)  Performed every 5 years for most people.  The goal for LDL is less than 100 mg/dL. If you are at high risk, the goal is less than 70 mg/dL.  The goal for HDL is 40 mg/dL-50 mg/dL for men and 50 mg/dL-60 mg/dL for women. An HDL cholesterol of 60 mg/dL or higher gives some protection against heart disease.  The goal for triglycerides is less than 150 mg/dL. Influenza vaccine, pneumococcal vaccine, and hepatitis B vaccine  The influenza vaccine is recommended yearly.  It is recommended that people with diabetes who are over 24 years old get the pneumonia vaccine. In some cases, two separate shots may be given. Ask your health care provider if your pneumonia vaccination is up to date.  The hepatitis B vaccine is also recommended for adults with diabetes. Diabetes self-management education  Education is recommended at diagnosis and ongoing as needed. Treatment plan  Your treatment plan is reviewed at every medical visit. Document Released: 04/08/2009 Document Revised: 10/26/2013 Document Reviewed: 11/11/2012 [facility] Patient Information 2015 [place],  [employer]. This information is not intended to replace advice given to you by your health care provider. Make sure you discuss any questions you have with your health care provider.  

## 2014-09-07 NOTE — Progress Notes (Signed)
Pre visit review using our clinic review tool, if applicable. No additional management support is needed unless otherwise documented below in the visit note. 

## 2014-09-08 LAB — BASIC METABOLIC PANEL
BUN: 26 mg/dL — AB (ref 6–23)
CALCIUM: 9.8 mg/dL (ref 8.4–10.5)
CHLORIDE: 103 meq/L (ref 96–112)
CO2: 26 meq/L (ref 19–32)
CREATININE: 1.43 mg/dL — AB (ref 0.40–1.20)
GFR: 39.63 mL/min — ABNORMAL LOW (ref >60.00)
GLUCOSE: 75 mg/dL (ref 70–99)
Potassium: 4.1 mEq/L (ref 3.5–5.1)
Sodium: 138 mEq/L (ref 135–145)

## 2014-09-08 LAB — HEPATIC FUNCTION PANEL
ALT: 14 U/L (ref 0–35)
AST: 19 U/L (ref 0–37)
Albumin: 4.6 g/dL (ref 3.5–5.2)
Alkaline Phosphatase: 82 U/L (ref 39–117)
Bilirubin, Direct: 0.1 mg/dL (ref 0.0–0.3)
TOTAL PROTEIN: 7.7 g/dL (ref 6.0–8.3)
Total Bilirubin: 0.5 mg/dL (ref 0.2–1.2)

## 2014-09-08 LAB — LIPID PANEL
Cholesterol: 183 mg/dL (ref 0–200)
HDL: 43.8 mg/dL (ref >39.00)
NonHDL: 139.2
Total CHOL/HDL Ratio: 4
Triglycerides: 245 mg/dL — ABNORMAL HIGH (ref 0.0–149.0)
VLDL: 49 mg/dL — ABNORMAL HIGH (ref 0.0–40.0)

## 2014-09-08 LAB — HEMOGLOBIN A1C: HEMOGLOBIN A1C: 6.6 % — AB (ref 4.6–6.5)

## 2014-09-08 LAB — LDL CHOLESTEROL, DIRECT: Direct LDL: 99 mg/dL

## 2014-09-08 LAB — MICROALBUMIN / CREATININE URINE RATIO
Creatinine,U: 150.9 mg/dL
MICROALB/CREAT RATIO: 0.5 mg/g (ref 0.0–30.0)
Microalb, Ur: 0.8 mg/dL (ref 0.0–1.9)

## 2014-10-29 ENCOUNTER — Telehealth: Payer: Self-pay | Admitting: Family Medicine

## 2014-10-29 NOTE — Telephone Encounter (Signed)
Relation to pt: self  Call back number: [phone]   Reason for call:  Pt requesting a refill HYDROcodone-acetaminophen (NORCO/VICODIN) 5-325 MG per tablet. Pt states she would like to discuss triglycerides

## 2014-11-01 MED ORDER — TRAMADOL HCL 50 MG PO TABS: ORAL_TABLET | ORAL | Status: DC

## 2014-11-01 MED ORDER — HYDROCODONE-ACETAMINOPHEN 5-325 MG PO TABS: 1.0000 | ORAL_TABLET | Freq: Four times a day (QID) | ORAL | Status: DC | PRN

## 2014-11-01 NOTE — Telephone Encounter (Signed)
Last seen and filled 09/07/14 #30 UDS 10/23/13 She is requesting to have both Hydrocodone and Tramadol as well. Tramadol has not been filled since 02/16/13 #60  Please advise     KP

## 2014-11-01 NOTE — Telephone Encounter (Signed)
Refill both x1

## 2014-11-02 NOTE — Telephone Encounter (Signed)
Patient aware Rx ready for pick up.      KP 

## 2014-11-25 ENCOUNTER — Encounter: Payer: Self-pay | Admitting: Family Medicine

## 2014-11-25 ENCOUNTER — Ambulatory Visit (INDEPENDENT_AMBULATORY_CARE_PROVIDER_SITE_OTHER): Payer: 59 | Admitting: Family Medicine

## 2014-11-25 VITALS — BP 108/60 | HR 87 | Temp 98.2°F | Resp 18 | Ht 62.5 in | Wt 201.0 lb

## 2014-11-25 DIAGNOSIS — L0291 Cutaneous abscess, unspecified: Secondary | ICD-10-CM | POA: Diagnosis not present

## 2014-11-25 DIAGNOSIS — L0390 Cellulitis, unspecified: Secondary | ICD-10-CM | POA: Diagnosis not present

## 2014-11-25 MED ORDER — DOXYCYCLINE HYCLATE 100 MG PO TABS: 100.0000 mg | ORAL_TABLET | Freq: Two times a day (BID) | ORAL | Status: DC

## 2014-11-25 NOTE — Progress Notes (Signed)
Patient ID: Beth Shepard, female    DOB: 12/19/1955  Age: 58 y.o. MRN: [id number]    Subjective:  Subjective HPI Beth Shepard presents for f/u cyst on R side neck.  Sunday a week ago it started hurting.  Monday it had grown to golf ball size.  It is painful.  Ampicillin helped slightly.  It has been there a total of few years but suddenly inc in size this week.    Review of Systems  Constitutional: Positive for fatigue. Negative for activity change, appetite change and unexpected weight change.  Respiratory: Negative for cough and shortness of breath.   Cardiovascular: Negative for chest pain and palpitations.  Skin:       Abscess R side neck, no drainage  Psychiatric/Behavioral: Negative for behavioral problems and dysphoric mood. The patient is not nervous/anxious.     History Past Medical History  Diagnosis Date  . COPD (chronic obstructive pulmonary disease)   . Diabetes mellitus     type 2  . GERD (gastroesophageal reflux disease)     chronic  . Hyperlipidemia   . Hypertension   . Anxiety   . Shingles   . LBBB (left bundle branch block)     Dr. Gwenith Spitz, [facility]  . Congestive heart failure   . Gastric polyps     She has past surgical history that includes Vaginal hysterectomy; Cholecystectomy; Knee arthroscopy (Left); Colonoscopy (06/06/2005); and Esophagogastroduodenoscopy (06/06/2005, 11/17/2013).   Her family history includes Cancer in her sister; GER disease in her mother; Gallstones in her mother; Heart disease in her maternal grandfather and mother; Heart disease (age of onset: 56) in her maternal grandmother; Hiatal hernia in her mother; Hypertension in her daughter, mother, paternal grandfather, and paternal grandmother; Lymphoma in her mother; Rheum arthritis in her father; Stroke in her paternal grandfather and paternal grandmother. There is no history of Colon cancer, Esophageal cancer, Pancreatic cancer, Liver disease, or Kidney disease.She  reports that she quit smoking about 12 years ago. Her smoking use included Cigarettes. She quit after 33 years of use. She has never used smokeless tobacco. She reports that she drinks alcohol. She reports that she does not use illicit drugs.  Current Outpatient Prescriptions on File Prior to Visit  Medication Sig Dispense Refill  . ALPRAZolam (XANAX) 0.25 MG tablet Take 1 1/2 tablet by mouth every morning 90 tablet 0  . aspirin 81 MG tablet Take 81 mg by mouth daily.    . carvedilol (COREG) 12.5 MG tablet Take 1 tablet by mouth 2 (two) times daily.    . furosemide (LASIX) 20 MG tablet Take 1 tablet (20 mg total) by mouth daily. Dr Ola Spurr 30 tablet   . glimepiride (AMARYL) 2 MG tablet Take 1 tablet (2 mg total) by mouth daily before breakfast. 90 tablet 3  . glucose blood (ONE TOUCH TEST STRIPS) test strip 1 each by Other route as needed. Use as instructed    . HYDROcodone-acetaminophen (NORCO/VICODIN) 5-325 MG per tablet Take 1 tablet by mouth every 6 (six) hours as needed for moderate pain. 30 tablet 0  . Hyoscyamine Sulfate 0.375 MG TBCR Take 1 tablet (0.375 mg total) by mouth 2 (two) times daily. 60 tablet 1  . lisinopril (PRINIVIL,ZESTRIL) 20 MG tablet Take 1 tablet (20 mg total) by mouth daily. 90 tablet 1  . ondansetron (ZOFRAN) 4 MG tablet Take 1 tablet (4 mg total) by mouth every 8 (eight) hours as needed for nausea or vomiting. 30 tablet 1  . pantoprazole (  PROTONIX) 20 MG tablet Take 1 tablet (20 mg total) by mouth daily. 90 tablet 3  . simvastatin (ZOCOR) 40 MG tablet TAKE 1 TABLET BY MOUTH IN THE EVENING- 90 tablet 3  . spironolactone (ALDACTONE) 25 MG tablet Take 1 tablet (25 mg total) by mouth every other day. Dr Ola Spurr    . traMADol (ULTRAM) 50 MG tablet TAKE 1 OR 2 TABLETS BY MOUTH EVERY 6 HOURS AS NEEDED 60 tablet 0   No current facility-administered medications on file prior to visit.     Objective:  Objective Physical Exam  Constitutional: She is oriented to  person, place, and time. She appears well-developed and well-nourished.  HENT:  Head: Normocephalic and atraumatic.  Eyes: Conjunctivae and EOM are normal.  Neck: Normal range of motion. Neck supple. No JVD present. Carotid bruit is not present. No thyromegaly present.    Cardiovascular: Normal rate, regular rhythm and normal heart sounds.   No murmur heard. Pulmonary/Chest: Effort normal and breath sounds normal. No respiratory distress. She has no wheezes. She has no rales. She exhibits no tenderness.  Musculoskeletal: She exhibits no edema.  Neurological: She is alert and oriented to person, place, and time.  Psychiatric: She has a normal mood and affect.   BP 108/60 mmHg  Pulse 87  Temp(Src) 98.2 F (36.8 C) (Oral)  Resp 18  Ht 5' 2.5" (1.588 m)  Wt 201 lb (91.173 kg)  BMI 36.15 kg/m2  SpO2 97% Wt Readings from Last 3 Encounters:  11/25/14 201 lb (91.173 kg)  09/07/14 199 lb (90.266 kg)  12/17/13 189 lb 6 oz (85.9 kg)     Lab Results  Component Value Date   WBC 7.1 11/13/2013   HGB 11.5* 11/13/2013   HCT 34.0* 11/13/2013   PLT 323.0 11/13/2013   GLUCOSE 75 09/07/2014   CHOL 183 09/07/2014   TRIG 245.0* 09/07/2014   HDL 43.80 09/07/2014   LDLDIRECT 99.0 09/07/2014   LDLCALC 64 11/13/2012   ALT 14 09/07/2014   AST 19 09/07/2014   NA 138 09/07/2014   K 4.1 09/07/2014   CL 103 09/07/2014   CREATININE 1.43* 09/07/2014   BUN 26* 09/07/2014   CO2 26 09/07/2014   TSH 0.890 10/21/2013   HGBA1C 6.6* 09/07/2014   MICROALBUR 0.8 09/07/2014    Nm Gastric Emptying  11/03/2013   CLINICAL DATA:  Abdominal pain with nausea and vomiting  EXAM: NUCLEAR MEDICINE GASTRIC EMPTYING SCAN  Views: LAO stomach  Radionuclide: Technetium 99 M sulfur colloid in oatmeal  Dose:  2.1 mCi  Route of administration: Oral  COMPARISON:  None.  FINDINGS: Images and count statistics were obtained serially over the stomach for a 2 hr time span. After 1 hr, 89% of solid material had emptied from  the stomach. After 2 hr, 94% of solid material had emptied from the stomach. These values are within normal limits.  IMPRESSION: Normal gastric emptying study.   Electronically Signed   By: Lowella Grip M.D.   On: 11/03/2013 12:38     Assessment & Plan:  Plan I am having Ms. Smyth start on doxycycline. I am also having her maintain her glucose blood, aspirin, spironolactone, furosemide, Hyoscyamine Sulfate, ondansetron, carvedilol, ALPRAZolam, glimepiride, lisinopril, simvastatin, pantoprazole, traMADol, and HYDROcodone-acetaminophen.  Meds ordered this encounter  Medications  . doxycycline (VIBRA-TABS) 100 MG tablet    Sig: Take 1 tablet (100 mg total) by mouth 2 (two) times daily.    Dispense:  20 tablet    Refill:  0  Problem List Items Addressed This Visit    None    Visit Diagnoses    Abscess and cellulitis    -  Primary    Relevant Medications    doxycycline (VIBRA-TABS) 100 MG tablet    Other Relevant Orders    Ambulatory referral to General Surgery       Follow-up: Return if symptoms worsen or fail to improve.  Garnet Koyanagi, DO

## 2014-11-25 NOTE — Patient Instructions (Signed)

## 2014-11-29 ENCOUNTER — Ambulatory Visit: Payer: BLUE CROSS/BLUE SHIELD | Admitting: Family Medicine

## 2014-11-30 ENCOUNTER — Other Ambulatory Visit: Payer: Self-pay | Admitting: Surgery

## 2014-12-19 ENCOUNTER — Other Ambulatory Visit: Payer: Self-pay | Admitting: Family Medicine

## 2014-12-24 ENCOUNTER — Other Ambulatory Visit: Payer: Self-pay | Admitting: Family Medicine

## 2014-12-24 ENCOUNTER — Telehealth: Payer: Self-pay | Admitting: Family Medicine

## 2014-12-24 MED ORDER — HYDROCODONE-ACETAMINOPHEN 5-325 MG PO TABS: 1.0000 | ORAL_TABLET | ORAL | Status: DC | PRN

## 2014-12-24 MED ORDER — TRAMADOL HCL 50 MG PO TABS: ORAL_TABLET | ORAL | Status: DC

## 2014-12-24 NOTE — Telephone Encounter (Signed)
Patient has been made aware and verbalized understanding. She will pick up the Rx on Tues    KP

## 2014-12-24 NOTE — Telephone Encounter (Signed)
1-2 po q4 hours prn #120

## 2014-12-24 NOTE — Telephone Encounter (Signed)
Caller name: Ariadna Setter Relationship to patient: self Can be reached: [phone] Pharmacy: [facility] [place]  Reason for call: Pt states that she needs refill on Tramadol and Hydrocodone. She wants [facility] and she states they have already requested it.  Pt also requesting refill on the hydrocodone. She said that she is very upset to learn that it is "90% tylenol" and that it doesn't even help. She learned this from the surgeon when she had cyst removed from her neck. She states that she cannot pick up the RX until next Tuesday or Wednesday. She is wanting a call back to explain why this is a controlled substance.

## 2014-12-24 NOTE — Telephone Encounter (Signed)
Last seen 11/25/14 and filled:  Hydrocodone 11/01/14 #30  She said [facility] has been allowing her to take 2 every 4 hours for the pain, she is taking the hydrocodone along with other med's and said she needs something more because she is in pain. She is taking Aleve, goody's powders and Tramadol at separate times along with the Hydrocodone. She said what she is taking is not helping her. She wants to know if you can increase the medication. Please advise     KP  Tramadol 11/01/14 #60 she is also requesting.   UDS 10/26/13 low risk. Please advise      KP

## 2015-01-18 DIAGNOSIS — I447 Left bundle-branch block, unspecified: Secondary | ICD-10-CM | POA: Insufficient documentation

## 2015-01-18 DIAGNOSIS — E119 Type 2 diabetes mellitus without complications: Secondary | ICD-10-CM | POA: Insufficient documentation

## 2015-01-18 DIAGNOSIS — I429 Cardiomyopathy, unspecified: Secondary | ICD-10-CM | POA: Insufficient documentation

## 2015-02-07 ENCOUNTER — Other Ambulatory Visit: Payer: Self-pay | Admitting: Family Medicine

## 2015-02-07 NOTE — Telephone Encounter (Signed)
Last seen 11/25/14 and filled 12/24/14 #60 No UDS.   Please advise     KP

## 2015-02-11 ENCOUNTER — Other Ambulatory Visit: Payer: Self-pay | Admitting: Orthopaedic Surgery

## 2015-02-11 DIAGNOSIS — M545 Low back pain: Secondary | ICD-10-CM

## 2015-02-14 ENCOUNTER — Telehealth: Payer: Self-pay | Admitting: Family Medicine

## 2015-02-14 MED ORDER — HYDROCODONE-ACETAMINOPHEN 5-325 MG PO TABS: 1.0000 | ORAL_TABLET | ORAL | Status: DC | PRN

## 2015-02-14 NOTE — Telephone Encounter (Signed)
Caller name: Beth Shepard Relationship to patient: Self   Can be reached: [phone] Pharmacy:  Reason for call: pt is requesting a refill on her Hydrocodone Rx. She want to send her daughter to pick it up.

## 2015-02-14 NOTE — Telephone Encounter (Signed)
Patient aware she will need to pick up the prescription.     KP

## 2015-02-14 NOTE — Telephone Encounter (Signed)
Last seen 11/25/14 and 12/24/14 #120 UDS 10/23/13 low risk   Please advise     KP

## 2015-02-14 NOTE — Telephone Encounter (Signed)
Refill x1 

## 2015-04-11 ENCOUNTER — Other Ambulatory Visit: Payer: Self-pay | Admitting: Family Medicine

## 2015-04-12 NOTE — Telephone Encounter (Signed)
Last seen 11/25/14 and filled 02/07/15 #60 UDS 03/03/15 low risk   Please advise     KP

## 2015-05-06 ENCOUNTER — Other Ambulatory Visit: Payer: Self-pay | Admitting: Family Medicine

## 2015-05-06 NOTE — Telephone Encounter (Signed)
Last seen 11/25/14 and filled 09/07/14 #90   Please advise     KP

## 2015-05-12 ENCOUNTER — Telehealth: Payer: Self-pay | Admitting: Family Medicine

## 2015-05-12 MED ORDER — HYDROCODONE-ACETAMINOPHEN 5-325 MG PO TABS: 1.0000 | ORAL_TABLET | ORAL | Status: DC | PRN

## 2015-05-12 NOTE — Telephone Encounter (Signed)
Last seen 11/25/14 and filled 02/14/15 #120 UDS 03/03/15 low risk   Please advise     KP

## 2015-05-12 NOTE — Telephone Encounter (Signed)
Relation to [id number] Call back number:[phone]   Reason for call:  Patient requesting a refill HYDROcodone-acetaminophen (NORCO/VICODIN) 5-325 MG per tablet

## 2015-05-12 NOTE — Telephone Encounter (Signed)
VM left advising Rx ready for pick up.     KP 

## 2015-05-12 NOTE — Telephone Encounter (Signed)
Refill x1 

## 2015-06-16 ENCOUNTER — Other Ambulatory Visit: Payer: Self-pay | Admitting: Family Medicine

## 2015-06-17 NOTE — Telephone Encounter (Signed)
Last seen 11/25/14 and filled 04/12/15 #60  Please advise    KP

## 2015-06-29 ENCOUNTER — Telehealth: Payer: Self-pay | Admitting: *Deleted

## 2015-06-29 NOTE — Telephone Encounter (Signed)
Received surgical clearance form for R Total hip arthroplasty.  Please call pt and schedule a 30 minute surgical clearance appt. Thanks, [employer]

## 2015-07-04 NOTE — Telephone Encounter (Signed)
Left message for patient to call to schedule 30 minute Surgical Clearance

## 2015-07-04 NOTE — Telephone Encounter (Signed)
Scheduled for 07/25/15

## 2015-07-22 ENCOUNTER — Ambulatory Visit: Payer: 59 | Admitting: Family Medicine

## 2015-07-25 ENCOUNTER — Ambulatory Visit: Payer: 59 | Admitting: Family Medicine

## 2015-08-08 ENCOUNTER — Other Ambulatory Visit: Payer: Self-pay | Admitting: Family Medicine

## 2015-08-08 NOTE — Telephone Encounter (Signed)
Called patient to schedule next available CPE with Dr. Etter Sjogren. Patient has a VM that has not been set up

## 2015-08-08 NOTE — Telephone Encounter (Signed)
Please schedule this patient the next available CPE.     KP

## 2015-08-10 DIAGNOSIS — R072 Precordial pain: Secondary | ICD-10-CM | POA: Insufficient documentation

## 2015-08-22 ENCOUNTER — Other Ambulatory Visit: Payer: Self-pay | Admitting: Family Medicine

## 2015-08-22 ENCOUNTER — Telehealth: Payer: Self-pay | Admitting: Family Medicine

## 2015-08-22 ENCOUNTER — Ambulatory Visit: Payer: 59 | Admitting: Family Medicine

## 2015-08-22 NOTE — Telephone Encounter (Signed)
I have tried to call the patient but her VM is not setup yet.     KP

## 2015-08-22 NOTE — Telephone Encounter (Signed)
Faxed.   KP 

## 2015-08-22 NOTE — Telephone Encounter (Signed)
Last seen 11/25/14, cancel today's apt and filled 05/06/15 #90  Please advise    KP

## 2015-08-22 NOTE — Telephone Encounter (Signed)
Last seen 6//21/6 and filled 06/17/15 #60   Please advise    KP

## 2015-08-22 NOTE — Telephone Encounter (Signed)
Pt called in 8:34am stating that she cannot make her appt today at 11:00am. She is in a lot of pain with her right hip and is afraid to drive herself as she can barely walk. She is also requesting a call from [place] to discuss surgical clearance appt. She was asking what is done during the appt.  Charge or no charge for no show?

## 2015-08-22 NOTE — Telephone Encounter (Signed)
Surgeon needs surgical clearance before they can do surgery

## 2015-08-22 NOTE — Telephone Encounter (Signed)
Patient has been made aware of what a surgical clearance is and she verbalized understanding. She had Cardiac workup with her Cardiologist. I advised her to send the information over to them.    KP

## 2015-08-23 NOTE — Telephone Encounter (Signed)
Charge or no charge for no show? Pt is rescheduled for 08/25/15.

## 2015-08-23 NOTE — Telephone Encounter (Signed)
No charge. 

## 2015-08-25 ENCOUNTER — Encounter: Payer: Self-pay | Admitting: Family Medicine

## 2015-08-25 ENCOUNTER — Ambulatory Visit (INDEPENDENT_AMBULATORY_CARE_PROVIDER_SITE_OTHER): Payer: 59 | Admitting: Family Medicine

## 2015-08-25 VITALS — BP 122/74 | HR 85 | Temp 98.2°F | Ht 62.6 in | Wt 215.4 lb

## 2015-08-25 DIAGNOSIS — I10 Essential (primary) hypertension: Secondary | ICD-10-CM | POA: Diagnosis not present

## 2015-08-25 DIAGNOSIS — K589 Irritable bowel syndrome without diarrhea: Secondary | ICD-10-CM

## 2015-08-25 DIAGNOSIS — E1151 Type 2 diabetes mellitus with diabetic peripheral angiopathy without gangrene: Secondary | ICD-10-CM | POA: Diagnosis not present

## 2015-08-25 DIAGNOSIS — Z01818 Encounter for other preprocedural examination: Secondary | ICD-10-CM

## 2015-08-25 DIAGNOSIS — E785 Hyperlipidemia, unspecified: Secondary | ICD-10-CM | POA: Diagnosis not present

## 2015-08-25 DIAGNOSIS — R079 Chest pain, unspecified: Secondary | ICD-10-CM

## 2015-08-25 DIAGNOSIS — M25551 Pain in right hip: Secondary | ICD-10-CM

## 2015-08-25 DIAGNOSIS — R319 Hematuria, unspecified: Secondary | ICD-10-CM

## 2015-08-25 LAB — POCT URINALYSIS DIPSTICK
Bilirubin, UA: NEGATIVE
Glucose, UA: NEGATIVE
KETONES UA: NEGATIVE
LEUKOCYTES UA: NEGATIVE
Nitrite, UA: NEGATIVE
PH UA: 5.5
PROTEIN UA: NEGATIVE
SPEC GRAV UA: 1.025
Urobilinogen, UA: 0.2

## 2015-08-25 MED ORDER — HYOSCYAMINE SULFATE ER 0.375 MG PO TBCR: 1.0000 | EXTENDED_RELEASE_TABLET | Freq: Two times a day (BID) | ORAL | Status: DC

## 2015-08-25 NOTE — Progress Notes (Signed)
Patient ID: Beth Shepard, female    DOB: 05/10/1956  Age: 60 y.o. MRN: [id number]    Subjective:  Subjective HPI Beth Shepard presents for preop clearance for R hip replacement  Review of Systems  Constitutional: Negative for diaphoresis, appetite change, fatigue and unexpected weight change.  Eyes: Negative for pain, redness and visual disturbance.  Respiratory: Negative for cough, chest tightness, shortness of breath and wheezing.   Cardiovascular: Negative for chest pain, palpitations and leg swelling.  Endocrine: Negative for cold intolerance, heat intolerance, polydipsia, polyphagia and polyuria.  Genitourinary: Negative for dysuria, frequency and difficulty urinating.  Musculoskeletal: Positive for arthralgias and gait problem.  Neurological: Negative for dizziness, light-headedness, numbness and headaches.    History Past Medical History  Diagnosis Date  . COPD (chronic obstructive pulmonary disease) ([place])   . Diabetes mellitus     type 2  . GERD (gastroesophageal reflux disease)     chronic  . Hyperlipidemia   . Hypertension   . Anxiety   . Shingles   . LBBB (left bundle branch block)     Dr. Gwenith Spitz, [facility]  . Congestive heart failure ([place])   . Gastric polyps     She has past surgical history that includes Vaginal hysterectomy; Cholecystectomy; Knee arthroscopy (Left); Colonoscopy (06/06/2005); and Esophagogastroduodenoscopy (06/06/2005, 11/17/2013).   Her family history includes Cancer in her sister; GER disease in her mother; Gallstones in her mother; Heart disease in her maternal grandfather and mother; Heart disease (age of onset: 23) in her maternal grandmother; Hiatal hernia in her mother; Hypertension in her daughter, mother, paternal grandfather, and paternal grandmother; Lymphoma in her mother; Rheum arthritis in her father; Stroke in her paternal grandfather and paternal grandmother. There is no history of Colon cancer, Esophageal cancer,  Pancreatic cancer, Liver disease, or Kidney disease.She reports that she quit smoking about 13 years ago. Her smoking use included Cigarettes. She quit after 33 years of use. She has never used smokeless tobacco. She reports that she drinks alcohol. She reports that she does not use illicit drugs.  No family hx of reactions to anesthesia  Current Outpatient Prescriptions on File Prior to Visit  Medication Sig Dispense Refill  . ALPRAZolam (XANAX) 0.25 MG tablet TAKE 1 AND 1/2 TABLETS BY MOUTH EVERY MORNING 90 tablet 0  . aspirin 81 MG tablet Take 81 mg by mouth daily.    . carvedilol (COREG) 12.5 MG tablet Take 1 tablet by mouth 2 (two) times daily.    . furosemide (LASIX) 20 MG tablet Take 1 tablet (20 mg total) by mouth daily. Dr Ola Spurr 30 tablet   . glimepiride (AMARYL) 2 MG tablet Take 1 tablet (2 mg total) by mouth daily before breakfast. 90 tablet 3  . glucose blood (ONE TOUCH TEST STRIPS) test strip 1 each by Other route as needed. Use as instructed    . HYDROcodone-acetaminophen (NORCO/VICODIN) 5-325 MG tablet Take 1-2 tablets by mouth every 4 (four) hours as needed for moderate pain. 120 tablet 0  . lisinopril (PRINIVIL,ZESTRIL) 20 MG tablet TAKE 1 TABLET (20 MG TOTAL) BY MOUTH DAILY. 90 tablet 0  . pantoprazole (PROTONIX) 20 MG tablet Take 1 tablet (20 mg total) by mouth daily. 90 tablet 3  . simvastatin (ZOCOR) 40 MG tablet TAKE 1 TABLET BY MOUTH IN THE EVENING- 90 tablet 3  . spironolactone (ALDACTONE) 25 MG tablet Take 1 tablet (25 mg total) by mouth every other day. Dr Ola Spurr    . traMADol (ULTRAM) 50 MG tablet TAKE  1-2 TABLETS EVERY 6 HOURS AS NEEDED 60 tablet 0   No current facility-administered medications on file prior to visit.     Objective:  Objective Physical Exam  Constitutional: She is oriented to person, place, and time. She appears well-developed and well-nourished.  HENT:  Head: Normocephalic and atraumatic.  Eyes: Conjunctivae and EOM are normal.  Neck:  Normal range of motion. Neck supple. No JVD present. Carotid bruit is not present. No thyromegaly present.  Cardiovascular: Normal rate, regular rhythm and normal heart sounds.   No murmur heard. Pulmonary/Chest: Effort normal and breath sounds normal. No respiratory distress. She has no wheezes. She has no rales. She exhibits no tenderness.  Musculoskeletal: She exhibits tenderness. She exhibits no edema.       Right hip: She exhibits decreased range of motion, decreased strength and tenderness.  Neurological: She is alert and oriented to person, place, and time.  Psychiatric: She has a normal mood and affect. Her behavior is normal. Judgment and thought content normal.  Nursing note and vitals reviewed.  BP 122/74 mmHg  Pulse 85  Temp(Src) 98.2 F (36.8 C) (Oral)  Ht 5' 2.6" (1.59 m)  Wt 215 lb 6.4 oz (97.705 kg)  BMI 38.65 kg/m2  SpO2 98% Wt Readings from Last 3 Encounters:  08/25/15 215 lb 6.4 oz (97.705 kg)  11/25/14 201 lb (91.173 kg)  09/07/14 199 lb (90.266 kg)     Lab Results  Component Value Date   WBC 11.1* 08/25/2015   HGB 10.8* 08/25/2015   HCT 32.5* 08/25/2015   PLT 346.0 08/25/2015   GLUCOSE 63* 08/25/2015   CHOL 213* 08/25/2015   TRIG 229.0* 08/25/2015   HDL 46.30 08/25/2015   LDLDIRECT 114.0 08/25/2015   LDLCALC 64 11/13/2012   ALT 27 08/25/2015   AST 24 08/25/2015   NA 138 08/25/2015   K 4.0 08/25/2015   CL 103 08/25/2015   CREATININE 1.19 08/25/2015   BUN 21 08/25/2015   CO2 27 08/25/2015   TSH 0.890 10/21/2013   HGBA1C 6.4 08/25/2015   MICROALBUR 0.8 09/07/2014    Nm Gastric Emptying  11/03/2013  CLINICAL DATA:  Abdominal pain with nausea and vomiting EXAM: NUCLEAR MEDICINE GASTRIC EMPTYING SCAN Views: LAO stomach Radionuclide: Technetium 99 M sulfur colloid in oatmeal Dose:  2.1 mCi Route of administration: Oral COMPARISON:  None. FINDINGS: Images and count statistics were obtained serially over the stomach for a 2 hr time span. After 1 hr, 89%  of solid material had emptied from the stomach. After 2 hr, 94% of solid material had emptied from the stomach. These values are within normal limits. IMPRESSION: Normal gastric emptying study. Electronically Signed   By: Lowella Grip M.D.   On: 11/03/2013 12:38     Assessment & Plan:  Plan I have discontinued Ms. Noland's ondansetron and doxycycline. I am also having her maintain her glucose blood, aspirin, spironolactone, furosemide, carvedilol, glimepiride, simvastatin, pantoprazole, HYDROcodone-acetaminophen, lisinopril, ALPRAZolam, traMADol, and Hyoscyamine Sulfate.  Meds ordered this encounter  Medications  . Hyoscyamine Sulfate 0.375 MG TBCR    Sig: Take 1 tablet (0.375 mg total) by mouth 2 (two) times daily.    Dispense:  60 tablet    Refill:  1    Problem List Items Addressed This Visit    Chest pain    Pt going through tests per cardiology      Right hip pain    Per ortho  medically cleared for surgery Cardiology clearance pending  Other Visit Diagnoses    Preoperative clearance    -  Primary    DM (diabetes mellitus) type II controlled peripheral vascular disorder (HCC)        Relevant Orders    Hemoglobin A1c (Completed)    CBC with Differential/Platelet (Completed)    POCT urinalysis dipstick (Completed)    Comp Met (CMET) (Completed)    Hyperlipidemia LDL goal <70        Relevant Orders    Lipid panel (Completed)    CBC with Differential/Platelet (Completed)    POCT urinalysis dipstick (Completed)    Comp Met (CMET) (Completed)    Essential hypertension        Relevant Medications    Hyoscyamine Sulfate 0.375 MG TBCR    Other Relevant Orders    Lipid panel (Completed)    Hemoglobin A1c (Completed)    CBC with Differential/Platelet (Completed)    POCT urinalysis dipstick (Completed)    Comp Met (CMET) (Completed)    IBS (irritable bowel syndrome)        Relevant Medications    Hyoscyamine Sulfate 0.375 MG TBCR    Hematuria        Relevant  Orders    Urine culture (Completed)       Follow-up: Return in about 6 months (around 02/25/2016), or if symptoms worsen or fail to improve, for htn, hyperlipidemia, DM.  Garnet Koyanagi, DO

## 2015-08-25 NOTE — Progress Notes (Signed)
Pre visit review using our clinic review tool, if applicable. No additional management support is needed unless otherwise documented below in the visit note. 

## 2015-08-25 NOTE — Assessment & Plan Note (Addendum)
Per ortho  medically cleared for surgery Cardiology clearance pending

## 2015-08-25 NOTE — Patient Instructions (Signed)

## 2015-08-25 NOTE — Assessment & Plan Note (Signed)
Pt going through tests per cardiology

## 2015-08-26 LAB — CBC WITH DIFFERENTIAL/PLATELET
BASOS PCT: 0.7 % (ref 0.0–3.0)
Basophils Absolute: 0.1 10*3/uL (ref 0.0–0.1)
EOS ABS: 0.2 10*3/uL (ref 0.0–0.7)
EOS PCT: 1.4 % (ref 0.0–5.0)
HEMATOCRIT: 32.5 % — AB (ref 36.0–46.0)
HEMOGLOBIN: 10.8 g/dL — AB (ref 12.0–15.0)
LYMPHS PCT: 18.3 % (ref 12.0–46.0)
Lymphs Abs: 2 10*3/uL (ref 0.7–4.0)
MCHC: 33.4 g/dL (ref 30.0–36.0)
MCV: 84.3 fl (ref 78.0–100.0)
Monocytes Absolute: 0.5 10*3/uL (ref 0.1–1.0)
Monocytes Relative: 4.5 % (ref 3.0–12.0)
Neutro Abs: 8.3 10*3/uL — ABNORMAL HIGH (ref 1.4–7.7)
Neutrophils Relative %: 75.1 % (ref 43.0–77.0)
Platelets: 346 10*3/uL (ref 150.0–400.0)
RBC: 3.85 Mil/uL — ABNORMAL LOW (ref 3.87–5.11)
RDW: 13.1 % (ref 11.5–15.5)
WBC: 11.1 10*3/uL — AB (ref 4.0–10.5)

## 2015-08-26 LAB — COMPREHENSIVE METABOLIC PANEL
ALBUMIN: 4.4 g/dL (ref 3.5–5.2)
ALK PHOS: 85 U/L (ref 39–117)
ALT: 27 U/L (ref 0–35)
AST: 24 U/L (ref 0–37)
BUN: 21 mg/dL (ref 6–23)
CALCIUM: 9.8 mg/dL (ref 8.4–10.5)
CHLORIDE: 103 meq/L (ref 96–112)
CO2: 27 mEq/L (ref 19–32)
CREATININE: 1.19 mg/dL (ref 0.40–1.20)
GFR: 48.84 mL/min — ABNORMAL LOW (ref >60.00)
Glucose, Bld: 63 mg/dL — ABNORMAL LOW (ref 70–99)
Potassium: 4 mEq/L (ref 3.5–5.1)
Sodium: 138 mEq/L (ref 135–145)
Total Bilirubin: 0.4 mg/dL (ref 0.2–1.2)
Total Protein: 7.5 g/dL (ref 6.0–8.3)

## 2015-08-26 LAB — HEMOGLOBIN A1C: Hgb A1c MFr Bld: 6.4 % (ref 4.6–6.5)

## 2015-08-26 LAB — URINE CULTURE
COLONY COUNT: NO GROWTH
Organism ID, Bacteria: NO GROWTH

## 2015-08-26 LAB — LIPID PANEL
CHOLESTEROL: 213 mg/dL — AB (ref 0–200)
HDL: 46.3 mg/dL (ref >39.00)
NonHDL: 166.64
Total CHOL/HDL Ratio: 5
Triglycerides: 229 mg/dL — ABNORMAL HIGH (ref 0.0–149.0)
VLDL: 45.8 mg/dL — ABNORMAL HIGH (ref 0.0–40.0)

## 2015-08-26 LAB — LDL CHOLESTEROL, DIRECT: Direct LDL: 114 mg/dL

## 2015-08-31 MED ORDER — FENOFIBRATE 160 MG PO TABS: 160.0000 mg | ORAL_TABLET | Freq: Every day | ORAL | Status: DC

## 2015-09-05 ENCOUNTER — Telehealth: Payer: Self-pay | Admitting: Family Medicine

## 2015-09-05 NOTE — Telephone Encounter (Signed)
Pt states she wants to talk to Beth Shepard about pain issues she was having when she had OV 08/25/15. Ph # [phone].

## 2015-09-05 NOTE — Telephone Encounter (Signed)
VM not setup yet, unable to leave a message.     KP

## 2015-09-05 NOTE — Telephone Encounter (Signed)
Spoke with patient and she stated she Dr.Chou's system was down since last week Thursday. The patient is asking for something stronger, she is requesting Ultracet instead of the Tramadol. Please advise    KP

## 2015-09-05 NOTE — Telephone Encounter (Signed)
ultracet is tramadol with tylenol She already has pain med with tylenol-- can't have both

## 2015-09-06 NOTE — Telephone Encounter (Signed)
Tried calling the patient-- No ans and Vm not setup.    KP

## 2015-09-12 ENCOUNTER — Other Ambulatory Visit: Payer: Self-pay | Admitting: Family Medicine

## 2015-09-12 NOTE — Telephone Encounter (Signed)
Medication filled to pharmacy as requested.   

## 2015-09-14 NOTE — Telephone Encounter (Signed)
Letter mailed     KP 

## 2015-10-03 ENCOUNTER — Telehealth: Payer: Self-pay | Admitting: *Deleted

## 2015-10-03 NOTE — Telephone Encounter (Signed)
Signed form faxed to [place] successfully at [phone]. Sent for scanning. JG//[id number]

## 2015-10-03 NOTE — Telephone Encounter (Signed)
Received Med Clearance paperwork; had OV for this on 08/25/15 [attached to note]/SLS 04/10

## 2015-10-11 ENCOUNTER — Other Ambulatory Visit: Payer: Self-pay | Admitting: Family Medicine

## 2015-10-13 ENCOUNTER — Other Ambulatory Visit: Payer: Self-pay | Admitting: Orthopaedic Surgery

## 2015-10-13 NOTE — Pre-Procedure Instructions (Signed)
Beth Shepard  10/13/2015     Your procedure is scheduled on : Monday Oct 24, 2015 at 7:30 AM.  Report to [facility] Admitting at 5:30 AM.  Call this number if you have problems the morning of surgery: [phone]    Remember:  Do not eat food or drink liquids after midnight.  Take these medicines the morning of surgery with A SIP OF WATER : Alprazolam (Xanax), Carvedilol (Coreg), Hydrocodone if needed, Pantoprazole (Protonix)   Stop taking any vitamins, herbal medications/supplements, NSAIDs, Ibuprofen, Advil, Motrin, Aleve, etc on Monday April 24th   Do NOT take any diabetic pills the morning of your surgery (NO Glimepiride/Amaryl)     How to Manage Your Diabetes Before and After Surgery  Why is it important to control my blood sugar before and after surgery? . Improving blood sugar levels before and after surgery helps healing and can limit problems. . A way of improving blood sugar control is eating a healthy diet by: o  Eating less sugar and carbohydrates o  Increasing activity/exercise o  Talking with your doctor about reaching your blood sugar goals . High blood sugars (greater than 180 mg/dL) can raise your risk of infections and slow your recovery, so you will need to focus on controlling your diabetes during the weeks before surgery. . Make sure that the doctor who takes care of your diabetes knows about your planned surgery including the date and location.  How do I manage my blood sugar before surgery? . Check your blood sugar at least 4 times a day, starting 2 days before surgery, to make sure that the level is not too high or low. o Check your blood sugar the morning of your surgery when you wake up and every 2 hours until you get to the Short Stay unit. . If your blood sugar is less than 70 mg/dL, you will need to treat for low blood sugar: o Do not take insulin. o Treat a low blood sugar (less than 70 mg/dL) with  cup of clear juice (cranberry or  apple), 4 glucose tablets, OR glucose gel. o Recheck blood sugar in 15 minutes after treatment (to make sure it is greater than 70 mg/dL). If your blood sugar is not greater than 70 mg/dL on recheck, call [phone] for further instructions. . Report your blood sugar to the short stay nurse when you get to Short Stay.  . If you are admitted to the hospital after surgery: o Your blood sugar will be checked by the staff and you will probably be given insulin after surgery (instead of oral diabetes medicines) to make sure you have good blood sugar levels. o The goal for blood sugar control after surgery is 80-180 mg/dL.      WHAT DO I DO ABOUT MY DIABETES MEDICATION?   Marland Kitchen Do not take oral diabetes medicines (pills) the morning of surgery.  Reviewed and Endorsed by [facility] Patient Education Committee, August 2015   Do not wear jewelry, make-up or nail polish.  Do not wear lotions, powders, or perfumes.    Do not shave 48 hours prior to surgery.  Do not bring valuables to the hospital.  [facility] is not responsible for any belongings or valuables.  Contacts, dentures or bridgework may not be worn into surgery.  Leave your suitcase in the car.  After surgery it may be brought to your room.  For patients admitted to the hospital, discharge time will be determined by your treatment  team.  Patients discharged the day of surgery will not be allowed to drive home.   Name and phone number of your driver:    Special instructions:  Shower using CHG soap the night before and the morning of your surgery  Please read over the following fact sheets that you were given. Pain Booklet, Coughing and Deep Breathing, Blood Transfusion Information, Total Joint Packet, MRSA Information and Surgical Site Infection Prevention

## 2015-10-14 ENCOUNTER — Encounter (HOSPITAL_COMMUNITY): Payer: Self-pay

## 2015-10-14 ENCOUNTER — Encounter (HOSPITAL_COMMUNITY)
Admission: RE | Admit: 2015-10-14 | Discharge: 2015-10-14 | Disposition: A | Discharge status: Home / Self Care | Payer: 59 | Source: Ambulatory Visit | Attending: Orthopaedic Surgery | Admitting: Orthopaedic Surgery

## 2015-10-14 DIAGNOSIS — Z87891 Personal history of nicotine dependence: Secondary | ICD-10-CM | POA: Diagnosis not present

## 2015-10-14 DIAGNOSIS — E785 Hyperlipidemia, unspecified: Secondary | ICD-10-CM | POA: Insufficient documentation

## 2015-10-14 DIAGNOSIS — Z79899 Other long term (current) drug therapy: Secondary | ICD-10-CM | POA: Insufficient documentation

## 2015-10-14 DIAGNOSIS — M1611 Unilateral primary osteoarthritis, right hip: Secondary | ICD-10-CM | POA: Insufficient documentation

## 2015-10-14 DIAGNOSIS — I509 Heart failure, unspecified: Secondary | ICD-10-CM | POA: Diagnosis not present

## 2015-10-14 DIAGNOSIS — Z01812 Encounter for preprocedural laboratory examination: Secondary | ICD-10-CM | POA: Insufficient documentation

## 2015-10-14 DIAGNOSIS — E119 Type 2 diabetes mellitus without complications: Secondary | ICD-10-CM | POA: Diagnosis not present

## 2015-10-14 DIAGNOSIS — K219 Gastro-esophageal reflux disease without esophagitis: Secondary | ICD-10-CM | POA: Insufficient documentation

## 2015-10-14 DIAGNOSIS — J449 Chronic obstructive pulmonary disease, unspecified: Secondary | ICD-10-CM | POA: Diagnosis not present

## 2015-10-14 DIAGNOSIS — Z7982 Long term (current) use of aspirin: Secondary | ICD-10-CM | POA: Insufficient documentation

## 2015-10-14 DIAGNOSIS — Z01818 Encounter for other preprocedural examination: Secondary | ICD-10-CM | POA: Diagnosis present

## 2015-10-14 DIAGNOSIS — Z0183 Encounter for blood typing: Secondary | ICD-10-CM | POA: Diagnosis not present

## 2015-10-14 DIAGNOSIS — Z7984 Long term (current) use of oral hypoglycemic drugs: Secondary | ICD-10-CM | POA: Diagnosis not present

## 2015-10-14 DIAGNOSIS — M069 Rheumatoid arthritis, unspecified: Secondary | ICD-10-CM | POA: Insufficient documentation

## 2015-10-14 HISTORY — DX: Localized edema: R60.0

## 2015-10-14 HISTORY — DX: Nausea with vomiting, unspecified: R11.2

## 2015-10-14 HISTORY — DX: Other specified postprocedural states: Z98.890

## 2015-10-14 HISTORY — DX: Rheumatoid arthritis, unspecified: M06.9

## 2015-10-14 HISTORY — DX: Pneumonia, unspecified organism: J18.9

## 2015-10-14 HISTORY — DX: Irritable bowel syndrome, unspecified: K58.9

## 2015-10-14 LAB — GLUCOSE, CAPILLARY: Glucose-Capillary: 181 mg/dL — ABNORMAL HIGH (ref 65–99)

## 2015-10-14 LAB — CBC WITH DIFFERENTIAL/PLATELET
BASOS ABS: 0.1 10*3/uL (ref 0.0–0.1)
BASOS PCT: 1 %
EOS PCT: 3 %
Eosinophils Absolute: 0.2 10*3/uL (ref 0.0–0.7)
HCT: 34.4 % — ABNORMAL LOW (ref 36.0–46.0)
Hemoglobin: 10.7 g/dL — ABNORMAL LOW (ref 12.0–15.0)
Lymphocytes Relative: 21 %
Lymphs Abs: 1.8 10*3/uL (ref 0.7–4.0)
MCH: 27.4 pg (ref 26.0–34.0)
MCHC: 31.1 g/dL (ref 30.0–36.0)
MCV: 88 fL (ref 78.0–100.0)
MONO ABS: 0.7 10*3/uL (ref 0.1–1.0)
Monocytes Relative: 8 %
NEUTROS ABS: 5.7 10*3/uL (ref 1.7–7.7)
Neutrophils Relative %: 67 %
PLATELETS: 256 10*3/uL (ref 150–400)
RBC: 3.91 MIL/uL (ref 3.87–5.11)
RDW: 13.8 % (ref 11.5–15.5)
WBC: 8.4 10*3/uL (ref 4.0–10.5)

## 2015-10-14 LAB — URINALYSIS, ROUTINE W REFLEX MICROSCOPIC
BILIRUBIN URINE: NEGATIVE
Glucose, UA: 100 mg/dL — AB
Ketones, ur: NEGATIVE mg/dL
Leukocytes, UA: NEGATIVE
NITRITE: NEGATIVE
PH: 5.5 (ref 5.0–8.0)
Protein, ur: NEGATIVE mg/dL
Specific Gravity, Urine: 1.023 (ref 1.005–1.030)

## 2015-10-14 LAB — COMPREHENSIVE METABOLIC PANEL
ALBUMIN: 3.7 g/dL (ref 3.5–5.0)
ALT: 13 U/L — ABNORMAL LOW (ref 14–54)
AST: 19 U/L (ref 15–41)
Alkaline Phosphatase: 67 U/L (ref 38–126)
Anion gap: 9 (ref 5–15)
BUN: 19 mg/dL (ref 6–20)
CHLORIDE: 104 mmol/L (ref 101–111)
CO2: 25 mmol/L (ref 22–32)
Calcium: 9.4 mg/dL (ref 8.9–10.3)
Creatinine, Ser: 1.38 mg/dL — ABNORMAL HIGH (ref 0.44–1.00)
GFR calc Af Amer: 46 mL/min — ABNORMAL LOW (ref >60)
GFR calc non Af Amer: 40 mL/min — ABNORMAL LOW (ref >60)
GLUCOSE: 150 mg/dL — AB (ref 65–99)
POTASSIUM: 4.1 mmol/L (ref 3.5–5.1)
Sodium: 138 mmol/L (ref 135–145)
Total Bilirubin: 0.3 mg/dL (ref 0.3–1.2)
Total Protein: 7 g/dL (ref 6.5–8.1)

## 2015-10-14 LAB — PROTIME-INR
INR: 0.99 (ref 0.00–1.49)
Prothrombin Time: 13.3 seconds (ref 11.6–15.2)

## 2015-10-14 LAB — C-REACTIVE PROTEIN: CRP: 0.5 mg/dL (ref <1.0)

## 2015-10-14 LAB — SURGICAL PCR SCREEN
MRSA, PCR: NEGATIVE
STAPHYLOCOCCUS AUREUS: NEGATIVE

## 2015-10-14 LAB — URINE MICROSCOPIC-ADD ON

## 2015-10-14 LAB — SEDIMENTATION RATE: SED RATE: 40 mm/h — AB (ref 0–22)

## 2015-10-14 LAB — ABO/RH: ABO/RH(D): O NEG

## 2015-10-14 LAB — APTT: APTT: 26 s (ref 24–37)

## 2015-10-14 NOTE — Progress Notes (Signed)
PCP Lyndal Pulley, DO  Cardiologist is Barrett Cheek. Patient denied having any acute chest pain or shortness of breath, but did inform Nurse that she mentioned to Dr. Atilano Median that she had been having some chest pain and he ordered for her to have a Nuclear stress test, EKG, and Echocardiogram. Will request records from Cardiologist.   Patient also informed Nurse that she developed a runny nose a little over a week ago, but the runny nose has stopped  and she has developed a cough. Patient denied any sputum formation, fever, or chills. Nurse informed patient to notify her PCP is cough becomes worse. Patient verbalized understanding.   Patient informed Nurse that she has noticed some increased swelling in both legs and ankles and that she was going to call Dr. Luisa Dago office and inform him of this. Patient stated "it happens sometimes and they just tell me to increase my lasix....Marland Kitchenbut I'll call them after I leave here." Nurse examined patients left leg (for TED hose application DOS); left leg felt tight, however no pitting edema was noted.   CBG on arrival to PAT was 181, and patient stated she drank one cup of coffee with cream and equal. Patient stated her blood sugars range from 161 to 91. Patients last A1c in EPIC was 6.4 on 08/25/15.   Ebony Hail, [place] called and informed of this. Will send chart to Anesthesia for review.

## 2015-10-17 NOTE — Progress Notes (Signed)
Anesthesia Chart Review:  Pt is a 60 year old female scheduled for R total hip arthroplasty anterior approach on 10/24/2015 with Dr. Erlinda Hong.   Cardiologist is Dr. Gwenith Spitz (care everywhere) who has cleared pt for surgery at moderate risk. PCP is Dr. Garnet Koyanagi.   PMH includes:  LBBB, CHF, DM, hyperlipidemia, COPD, RA, post-op N/V, GERD. Former smoker. BMI 35.5  Medications include: ASA, carvedilol, fenofibrate, lasix, glimepiride, lisinopril, protonix, simvastatin, spironolactone  Preoperative labs reviewed.  Glucose 150. HgbA1c was 6.4 on 08/25/15.   EKG 08/10/15: sinus rhythm. LBBB.  Nuclear stress test 08/29/15 (in media tab):  - The quality of the study is suboptimal.  - Abnormal perfusion stress test with evidence of moderate area of mainly fixed defect consistent with soft tissue attenuation or previous scar. No evidence of ischemia - LV EF 44%  Echo 08/29/15 (in media tab):  1. LV cavity normal in size. Global hypokinesis. EF 40-45%. Abnormal septal wall motion due to LBBB. Grade I diastolic dysfunction 2. LA moderately dilated 3. Mild to moderate MR 4. Mild TR. Mild pulmonary HTN 5. Mild PR  If no changes, I anticipate pt can proceed with surgery as scheduled.   Willeen Cass, FNP-BC [facility]/Anesthesiology Phone: [phone] 10/17/2015 1:08 PM

## 2015-10-21 ENCOUNTER — Encounter: Payer: 59 | Admitting: Family Medicine

## 2015-10-23 MED ORDER — TRANEXAMIC ACID 1000 MG/10ML IV SOLN
1000.0000 mg | INTRAVENOUS | Status: AC
  Administered 2015-10-24: 1000 mg via INTRAVENOUS
  Filled 2015-10-23: qty 10

## 2015-10-23 MED ORDER — CEFAZOLIN SODIUM-DEXTROSE 2-4 GM/100ML-% IV SOLN
2.0000 g | INTRAVENOUS | Status: AC
  Administered 2015-10-24: 2 g via INTRAVENOUS
  Filled 2015-10-23: qty 100

## 2015-10-23 NOTE — Anesthesia Preprocedure Evaluation (Addendum)
Anesthesia Evaluation  Patient identified by MRN, date of birth, ID band Patient awake    Reviewed: Allergy & Precautions, NPO status , Patient's Chart, lab work & pertinent test results  History of Anesthesia Complications (+) PONV and history of anesthetic complications  Airway Mallampati: III  TM Distance: >3 FB Neck ROM: Full    Dental no notable dental hx. (+) Dental Advisory Given, Teeth Intact   Pulmonary COPD,  COPD inhaler, former smoker,    Pulmonary exam normal breath sounds clear to auscultation       Cardiovascular hypertension, Pt. on medications and Pt. on home beta blockers +CHF  Normal cardiovascular exam+ dysrhythmias (LBBB)  Rhythm:Regular Rate:Normal  EKG 08/10/15: sinus rhythm. LBBB.  Nuclear stress test 08/29/15 (in media tab):  - The quality of the study is suboptimal.  - Abnormal perfusion stress test with evidence of moderate area of mainly fixed defect consistent with soft tissue attenuation or previous scar. No evidence of ischemia - LV EF 44%  Echo 08/29/15 (in media tab):  1. LV cavity normal in size. Global hypokinesis. EF 40-45%. Abnormal septal wall motion due to LBBB. Grade I diastolic dysfunction 2. LA moderately dilated 3. Mild to moderate MR 4. Mild TR. Mild pulmonary HTN 5. Mild PR   Cleared by cardiology for surgery    Neuro/Psych PSYCHIATRIC DISORDERS Anxiety negative neurological ROS     GI/Hepatic Neg liver ROS, GERD  Medicated and Controlled,  Endo/Other  diabetes, Type 2, Oral Hypoglycemic AgentsMorbid obesityobesity  Renal/GU negative Renal ROS  negative genitourinary   Musculoskeletal  (+) Arthritis , Osteoarthritis,    Abdominal   Peds negative pediatric ROS (+)  Hematology negative hematology ROS (+)   Anesthesia Other Findings   Reproductive/Obstetrics negative OB ROS                         Anesthesia Physical Anesthesia Plan  ASA:  III  Anesthesia Plan: Spinal   Post-op Pain Management:    Induction:   Airway Management Planned:   Additional Equipment:   Intra-op Plan:   Post-operative Plan:   Informed Consent: I have reviewed the patients History and Physical, chart, labs and discussed the procedure including the risks, benefits and alternatives for the proposed anesthesia with the patient or authorized representative who has indicated his/her understanding and acceptance.   Dental advisory given  Plan Discussed with: CRNA  Anesthesia Plan Comments:         Anesthesia Quick Evaluation

## 2015-10-23 NOTE — H&P (Signed)
PREOPERATIVE H&P  Chief Complaint: Right hip degenerative joint disease  HPI: Beth Shepard is a 60 y.o. female who presents for surgical treatment of Right hip degenerative joint disease.  She denies any changes in medical history.  Past Medical History  Diagnosis Date  . COPD (chronic obstructive pulmonary disease) ([place])   . Diabetes mellitus     type 2  . GERD (gastroesophageal reflux disease)     chronic  . Hyperlipidemia   . Hypertension   . Anxiety   . Shingles   . Congestive heart failure ([place])   . Gastric polyps   . LBBB (left bundle branch block)     Dr. Gwenith Spitz, [facility]  . Pneumonia     hx of  . Bilateral edema of lower extremity     Swelling in legs and ankles  . IBS (irritable bowel syndrome)   . Rheumatoid arthritis ([place])   . PONV (postoperative nausea and vomiting)     Had N/V after having tubal ligation and D&C   Past Surgical History  Procedure Laterality Date  . Vaginal hysterectomy    . Cholecystectomy    . Knee arthroscopy Left   . Colonoscopy  06/06/2005    normal   . Esophagogastroduodenoscopy  06/06/2005, 11/17/2013  . Cardiac catheterization      No PCI  . Tubal ligation    . Dilation and curettage of uterus     Social History   Social History  . Marital Status: Widowed    Spouse Name: N/A  . Number of Children: 2  . Years of Education: N/A   Occupational History  . Administive     Social History Main Topics  . Smoking status: Former Smoker -- 33 years    Types: Cigarettes    Quit date: 06/25/2002  . Smokeless tobacco: Never Used  . Alcohol Use: Yes     Comment: rare  . Drug Use: No  . Sexual Activity:    Partners: Male   Other Topics Concern  . Not on file   Social History Narrative   Limited exercise per cardiology   Family History  Problem Relation Age of Onset  . Lymphoma Mother   . Heart disease Mother     chf, cabg x6  . Hypertension Mother   . GER disease Mother   . Hiatal hernia  Mother   . Gallstones Mother   . Cancer Sister     brain stage 4  . Heart disease Maternal Grandmother 34    MI  . Heart disease Maternal Grandfather   . Stroke Paternal Grandmother   . Hypertension Paternal Grandmother   . Stroke Paternal Grandfather   . Hypertension Paternal Grandfather   . Rheum arthritis Father   . Colon cancer Neg Hx   . Esophageal cancer Neg Hx   . Pancreatic cancer Neg Hx   . Liver disease Neg Hx   . Kidney disease Neg Hx   . Hypertension Daughter    Allergies  Allergen Reactions  . Prednisone Palpitations and Other (See Comments)    Dose pack causes vomiting; Causes Gerd  . Codeine     REACTION: vomiting  . Ibuprofen Other (See Comments)    REACTION: questionable. Pt states that she can take this sometimes.   Prior to Admission medications   Medication Sig Start Date End Date Taking? Authorizing Provider  ALPRAZolam Duanne Moron) 0.25 MG tablet TAKE 1 AND 1/2 TABLETS BY MOUTH EVERY MORNING 08/22/15  Yes Kendrick Fries  R Lowne Chase, DO  aspirin 81 MG tablet Take 81 mg by mouth daily.   Yes Historical Provider, MD  carvedilol (COREG) 12.5 MG tablet Take 1 tablet by mouth 2 (two) times daily. 08/26/14  Yes Historical Provider, MD  fenofibrate 160 MG tablet Take 1 tablet (160 mg total) by mouth daily. 08/31/15  Yes Yvonne R Lowne Chase, DO  furosemide (LASIX) 20 MG tablet Take 1 tablet (20 mg total) by mouth daily. Dr Ola Spurr 11/13/12  Yes Alferd Apa Lowne Chase, DO  glimepiride (AMARYL) 2 MG tablet TAKE 1 TABLET (2 MG TOTAL) BY MOUTH DAILY BEFORE BREAKFAST. 09/12/15  Yes Rosalita Chessman Chase, DO  HYDROcodone-acetaminophen (NORCO/VICODIN) 5-325 MG tablet Take 1-2 tablets by mouth every 4 (four) hours as needed for moderate pain. 05/12/15  Yes Yvonne R Lowne Chase, DO  Hyoscyamine Sulfate 0.375 MG TBCR Take 1 tablet (0.375 mg total) by mouth 2 (two) times daily. 08/25/15  Yes Yvonne R Lowne Chase, DO  lisinopril (PRINIVIL,ZESTRIL) 20 MG tablet TAKE 1 TABLET (20 MG TOTAL) BY MOUTH  DAILY. 08/08/15  Yes Yvonne R Lowne Chase, DO  pantoprazole (PROTONIX) 20 MG tablet TAKE 1 TABLET (20 MG TOTAL) BY MOUTH DAILY. 10/11/15  Yes Yvonne R Lowne Chase, DO  simvastatin (ZOCOR) 40 MG tablet TAKE 1 TABLET BY MOUTH IN THE EVENING- 09/07/14  Yes Yvonne R Lowne Chase, DO  spironolactone (ALDACTONE) 25 MG tablet Take 1 tablet (25 mg total) by mouth every other day. Dr Ola Spurr 11/13/12  Yes Alferd Apa Lowne Chase, DO  glucose blood (ONE TOUCH TEST STRIPS) test strip 1 each by Other route as needed. Use as instructed    Historical Provider, MD  traMADol (ULTRAM) 50 MG tablet TAKE 1-2 TABLETS EVERY 6 HOURS AS NEEDED Patient not taking: Reported on 10/14/2015 08/22/15   Rosalita Chessman Chase, DO     Positive ROS: All other systems have been reviewed and were otherwise negative with the exception of those mentioned in the HPI and as above.  Physical Exam: General: Alert, no acute distress Cardiovascular: No pedal edema Respiratory: No cyanosis, no use of accessory musculature GI: abdomen soft Skin: No lesions in the area of chief complaint Neurologic: Sensation intact distally Psychiatric: Patient is competent for consent with normal mood and affect Lymphatic: no lymphedema  MUSCULOSKELETAL: exam stable  Assessment: Right hip degenerative joint disease  Plan: Plan for Procedure(s): RIGHT TOTAL HIP ARTHROPLASTY ANTERIOR APPROACH  The risks benefits and alternatives were discussed with the patient including but not limited to the risks of nonoperative treatment, versus surgical intervention including infection, bleeding, nerve injury,  blood clots, cardiopulmonary complications, morbidity, mortality, among others, and they were willing to proceed.   Marianna Payment, MD   10/23/2015 8:14 PM

## 2015-10-24 ENCOUNTER — Inpatient Hospital Stay (HOSPITAL_COMMUNITY): Payer: 59

## 2015-10-24 ENCOUNTER — Inpatient Hospital Stay (HOSPITAL_COMMUNITY)
Admission: RE | Admit: 2015-10-24 | Discharge: 2015-10-26 | DRG: 470 | Disposition: A | Discharge status: Skilled Nursing Facility | Payer: 59 | Source: Ambulatory Visit | Attending: Orthopaedic Surgery | Admitting: Orthopaedic Surgery

## 2015-10-24 ENCOUNTER — Encounter (HOSPITAL_COMMUNITY)
Admission: RE | Disposition: A | Discharge status: Skilled Nursing Facility | Payer: Self-pay | Source: Ambulatory Visit | Attending: Orthopaedic Surgery

## 2015-10-24 ENCOUNTER — Inpatient Hospital Stay (HOSPITAL_COMMUNITY): Payer: 59 | Admitting: Vascular Surgery

## 2015-10-24 ENCOUNTER — Encounter (HOSPITAL_COMMUNITY): Payer: Self-pay | Admitting: Anesthesiology

## 2015-10-24 ENCOUNTER — Inpatient Hospital Stay (HOSPITAL_COMMUNITY): Payer: 59 | Admitting: Anesthesiology

## 2015-10-24 DIAGNOSIS — K589 Irritable bowel syndrome without diarrhea: Secondary | ICD-10-CM | POA: Diagnosis present

## 2015-10-24 DIAGNOSIS — I509 Heart failure, unspecified: Secondary | ICD-10-CM | POA: Diagnosis present

## 2015-10-24 DIAGNOSIS — Z886 Allergy status to analgesic agent status: Secondary | ICD-10-CM | POA: Diagnosis not present

## 2015-10-24 DIAGNOSIS — I447 Left bundle-branch block, unspecified: Secondary | ICD-10-CM | POA: Diagnosis present

## 2015-10-24 DIAGNOSIS — Z7982 Long term (current) use of aspirin: Secondary | ICD-10-CM | POA: Diagnosis not present

## 2015-10-24 DIAGNOSIS — F419 Anxiety disorder, unspecified: Secondary | ICD-10-CM | POA: Diagnosis present

## 2015-10-24 DIAGNOSIS — M069 Rheumatoid arthritis, unspecified: Secondary | ICD-10-CM | POA: Diagnosis present

## 2015-10-24 DIAGNOSIS — J449 Chronic obstructive pulmonary disease, unspecified: Secondary | ICD-10-CM | POA: Diagnosis present

## 2015-10-24 DIAGNOSIS — Z87891 Personal history of nicotine dependence: Secondary | ICD-10-CM

## 2015-10-24 DIAGNOSIS — Z79899 Other long term (current) drug therapy: Secondary | ICD-10-CM | POA: Diagnosis not present

## 2015-10-24 DIAGNOSIS — Z96649 Presence of unspecified artificial hip joint: Secondary | ICD-10-CM

## 2015-10-24 DIAGNOSIS — D62 Acute posthemorrhagic anemia: Secondary | ICD-10-CM | POA: Diagnosis not present

## 2015-10-24 DIAGNOSIS — I110 Hypertensive heart disease with heart failure: Secondary | ICD-10-CM | POA: Diagnosis present

## 2015-10-24 DIAGNOSIS — Z885 Allergy status to narcotic agent status: Secondary | ICD-10-CM | POA: Diagnosis not present

## 2015-10-24 DIAGNOSIS — K219 Gastro-esophageal reflux disease without esophagitis: Secondary | ICD-10-CM | POA: Diagnosis present

## 2015-10-24 DIAGNOSIS — Z419 Encounter for procedure for purposes other than remedying health state, unspecified: Secondary | ICD-10-CM

## 2015-10-24 DIAGNOSIS — I272 Other secondary pulmonary hypertension: Secondary | ICD-10-CM | POA: Diagnosis present

## 2015-10-24 DIAGNOSIS — M1611 Unilateral primary osteoarthritis, right hip: Secondary | ICD-10-CM | POA: Diagnosis present

## 2015-10-24 DIAGNOSIS — E785 Hyperlipidemia, unspecified: Secondary | ICD-10-CM | POA: Diagnosis present

## 2015-10-24 DIAGNOSIS — Z888 Allergy status to other drugs, medicaments and biological substances status: Secondary | ICD-10-CM

## 2015-10-24 DIAGNOSIS — E119 Type 2 diabetes mellitus without complications: Secondary | ICD-10-CM | POA: Diagnosis present

## 2015-10-24 DIAGNOSIS — Z8249 Family history of ischemic heart disease and other diseases of the circulatory system: Secondary | ICD-10-CM | POA: Diagnosis not present

## 2015-10-24 HISTORY — PX: JOINT REPLACEMENT: SHX530

## 2015-10-24 HISTORY — PX: TOTAL HIP ARTHROPLASTY: SHX124

## 2015-10-24 LAB — CREATININE, SERUM
CREATININE: 0.98 mg/dL (ref 0.44–1.00)
GFR calc Af Amer: >60 mL/min (ref >60)
GFR calc non Af Amer: >60 mL/min (ref >60)

## 2015-10-24 LAB — GLUCOSE, CAPILLARY
GLUCOSE-CAPILLARY: 134 mg/dL — AB (ref 65–99)
Glucose-Capillary: 118 mg/dL — ABNORMAL HIGH (ref 65–99)
Glucose-Capillary: 129 mg/dL — ABNORMAL HIGH (ref 65–99)

## 2015-10-24 LAB — CBC
HEMATOCRIT: 29.1 % — AB (ref 36.0–46.0)
Hemoglobin: 9.2 g/dL — ABNORMAL LOW (ref 12.0–15.0)
MCH: 28.4 pg (ref 26.0–34.0)
MCHC: 31.6 g/dL (ref 30.0–36.0)
MCV: 89.8 fL (ref 78.0–100.0)
PLATELETS: 296 10*3/uL (ref 150–400)
RBC: 3.24 MIL/uL — ABNORMAL LOW (ref 3.87–5.11)
RDW: 13.5 % (ref 11.5–15.5)
WBC: 14.5 10*3/uL — ABNORMAL HIGH (ref 4.0–10.5)

## 2015-10-24 LAB — TROPONIN I

## 2015-10-24 SURGERY — ARTHROPLASTY, HIP, TOTAL, ANTERIOR APPROACH
Anesthesia: Spinal | Site: Hip | Laterality: Right

## 2015-10-24 MED ORDER — INSULIN ASPART 100 UNIT/ML ~~LOC~~ SOLN: 0.0000 [IU] | Freq: Every day | SUBCUTANEOUS | Status: DC

## 2015-10-24 MED ORDER — PHENOL 1.4 % MT LIQD: 1.0000 | OROMUCOSAL | Status: DC | PRN

## 2015-10-24 MED ORDER — FENTANYL CITRATE (PF) 100 MCG/2ML IJ SOLN
25.0000 ug | INTRAMUSCULAR | Status: DC | PRN
  Administered 2015-10-24 (×2): 50 ug via INTRAVENOUS

## 2015-10-24 MED ORDER — POLYETHYLENE GLYCOL 3350 17 G PO PACK: 17.0000 g | PACK | Freq: Every day | ORAL | Status: DC | PRN

## 2015-10-24 MED ORDER — ACETAMINOPHEN 325 MG PO TABS: 650.0000 mg | ORAL_TABLET | Freq: Four times a day (QID) | ORAL | Status: DC | PRN

## 2015-10-24 MED ORDER — ALUM & MAG HYDROXIDE-SIMETH 200-200-20 MG/5ML PO SUSP: 30.0000 mL | ORAL | Status: DC | PRN

## 2015-10-24 MED ORDER — OXYCODONE HCL 5 MG PO TABS
5.0000 mg | ORAL_TABLET | ORAL | Status: DC | PRN
  Administered 2015-10-24: 15 mg via ORAL
  Administered 2015-10-24 – 2015-10-26 (×5): 10 mg via ORAL
  Filled 2015-10-24 (×5): qty 2

## 2015-10-24 MED ORDER — TRANEXAMIC ACID 1000 MG/10ML IV SOLN
2000.0000 mg | INTRAVENOUS | Status: DC
  Filled 2015-10-24: qty 20

## 2015-10-24 MED ORDER — CHLORHEXIDINE GLUCONATE 4 % EX LIQD: 60.0000 mL | Freq: Once | CUTANEOUS | Status: DC

## 2015-10-24 MED ORDER — SODIUM CHLORIDE 0.9 % IR SOLN
Status: DC | PRN
  Administered 2015-10-24: 3000 mL

## 2015-10-24 MED ORDER — OXYCODONE HCL ER 10 MG PO T12A: 10.0000 mg | EXTENDED_RELEASE_TABLET | Freq: Two times a day (BID) | ORAL | Status: DC

## 2015-10-24 MED ORDER — GLYCOPYRROLATE 0.2 MG/ML IV SOSY
PREFILLED_SYRINGE | INTRAVENOUS | Status: AC
  Filled 2015-10-24: qty 3

## 2015-10-24 MED ORDER — KETOROLAC TROMETHAMINE 30 MG/ML IJ SOLN
30.0000 mg | Freq: Four times a day (QID) | INTRAMUSCULAR | Status: AC | PRN
  Administered 2015-10-24: 30 mg via INTRAVENOUS

## 2015-10-24 MED ORDER — FUROSEMIDE 20 MG PO TABS
20.0000 mg | ORAL_TABLET | Freq: Every day | ORAL | Status: DC
  Administered 2015-10-24 – 2015-10-26 (×3): 20 mg via ORAL
  Filled 2015-10-24 (×3): qty 1

## 2015-10-24 MED ORDER — TRANEXAMIC ACID 1000 MG/10ML IV SOLN
2000.0000 mg | INTRAVENOUS | Status: DC | PRN
  Administered 2015-10-24: 2000 mg via INTRAVENOUS

## 2015-10-24 MED ORDER — PROPOFOL 10 MG/ML IV BOLUS
INTRAVENOUS | Status: AC
  Filled 2015-10-24: qty 20

## 2015-10-24 MED ORDER — MIDAZOLAM HCL 2 MG/2ML IJ SOLN
INTRAMUSCULAR | Status: AC
  Filled 2015-10-24: qty 2

## 2015-10-24 MED ORDER — MAGNESIUM CITRATE PO SOLN: 1.0000 | Freq: Once | ORAL | Status: DC | PRN

## 2015-10-24 MED ORDER — BUPIVACAINE IN DEXTROSE 0.75-8.25 % IT SOLN
INTRATHECAL | Status: DC | PRN
  Administered 2015-10-24: 2 mL via INTRATHECAL

## 2015-10-24 MED ORDER — FENTANYL CITRATE (PF) 100 MCG/2ML IJ SOLN
INTRAMUSCULAR | Status: DC | PRN
  Administered 2015-10-24: 50 ug via INTRAVENOUS

## 2015-10-24 MED ORDER — DIPHENHYDRAMINE HCL 12.5 MG/5ML PO ELIX: 25.0000 mg | ORAL_SOLUTION | ORAL | Status: DC | PRN

## 2015-10-24 MED ORDER — OXYCODONE HCL 5 MG PO TABS
ORAL_TABLET | ORAL | Status: AC
  Filled 2015-10-24: qty 3

## 2015-10-24 MED ORDER — LACTATED RINGERS IV SOLN
INTRAVENOUS | Status: DC | PRN
  Administered 2015-10-24 (×2): via INTRAVENOUS

## 2015-10-24 MED ORDER — ROCURONIUM BROMIDE 50 MG/5ML IV SOLN
INTRAVENOUS | Status: AC
  Filled 2015-10-24: qty 1

## 2015-10-24 MED ORDER — METHOCARBAMOL 500 MG PO TABS
ORAL_TABLET | ORAL | Status: AC
  Filled 2015-10-24: qty 1

## 2015-10-24 MED ORDER — PROPOFOL 500 MG/50ML IV EMUL
INTRAVENOUS | Status: DC | PRN
  Administered 2015-10-24: 25 ug/kg/min via INTRAVENOUS

## 2015-10-24 MED ORDER — METHOCARBAMOL 750 MG PO TABS: 750.0000 mg | ORAL_TABLET | Freq: Two times a day (BID) | ORAL | Status: DC | PRN

## 2015-10-24 MED ORDER — SIMVASTATIN 40 MG PO TABS
40.0000 mg | ORAL_TABLET | Freq: Every day | ORAL | Status: DC
  Administered 2015-10-24 – 2015-10-25 (×2): 40 mg via ORAL
  Filled 2015-10-24 (×2): qty 1

## 2015-10-24 MED ORDER — SENNOSIDES-DOCUSATE SODIUM 8.6-50 MG PO TABS
1.0000 | ORAL_TABLET | Freq: Every evening | ORAL | Status: DC | PRN
Start: 2015-10-24 — End: 2016-01-13

## 2015-10-24 MED ORDER — ONDANSETRON HCL 4 MG/2ML IJ SOLN
INTRAMUSCULAR | Status: AC
  Filled 2015-10-24: qty 2

## 2015-10-24 MED ORDER — PHENYLEPHRINE 40 MCG/ML (10ML) SYRINGE FOR IV PUSH (FOR BLOOD PRESSURE SUPPORT)
PREFILLED_SYRINGE | INTRAVENOUS | Status: AC
  Filled 2015-10-24: qty 10

## 2015-10-24 MED ORDER — ACETAMINOPHEN 650 MG RE SUPP: 650.0000 mg | Freq: Four times a day (QID) | RECTAL | Status: DC | PRN

## 2015-10-24 MED ORDER — FENTANYL CITRATE (PF) 250 MCG/5ML IJ SOLN
INTRAMUSCULAR | Status: AC
  Filled 2015-10-24: qty 5

## 2015-10-24 MED ORDER — ACETAMINOPHEN 500 MG PO TABS
1000.0000 mg | ORAL_TABLET | Freq: Four times a day (QID) | ORAL | Status: AC
  Administered 2015-10-24 – 2015-10-25 (×4): 1000 mg via ORAL
  Filled 2015-10-24 (×4): qty 2

## 2015-10-24 MED ORDER — CELECOXIB 200 MG PO CAPS
200.0000 mg | ORAL_CAPSULE | Freq: Two times a day (BID) | ORAL | Status: DC
  Administered 2015-10-24 (×2): 200 mg via ORAL
  Filled 2015-10-24 (×2): qty 1

## 2015-10-24 MED ORDER — INSULIN ASPART 100 UNIT/ML ~~LOC~~ SOLN
0.0000 [IU] | Freq: Three times a day (TID) | SUBCUTANEOUS | Status: DC
  Administered 2015-10-24 – 2015-10-25 (×3): 2 [IU] via SUBCUTANEOUS
  Administered 2015-10-25: 3 [IU] via SUBCUTANEOUS
  Administered 2015-10-26 (×2): 2 [IU] via SUBCUTANEOUS

## 2015-10-24 MED ORDER — OXYCODONE HCL ER 10 MG PO T12A
10.0000 mg | EXTENDED_RELEASE_TABLET | Freq: Two times a day (BID) | ORAL | Status: DC
  Administered 2015-10-24 – 2015-10-26 (×5): 10 mg via ORAL
  Filled 2015-10-24 (×5): qty 1

## 2015-10-24 MED ORDER — KETOROLAC TROMETHAMINE 30 MG/ML IJ SOLN
INTRAMUSCULAR | Status: AC
  Filled 2015-10-24: qty 1

## 2015-10-24 MED ORDER — CARVEDILOL 12.5 MG PO TABS
12.5000 mg | ORAL_TABLET | Freq: Two times a day (BID) | ORAL | Status: DC
  Administered 2015-10-24 – 2015-10-26 (×4): 12.5 mg via ORAL
  Filled 2015-10-24 (×4): qty 1

## 2015-10-24 MED ORDER — TRANEXAMIC ACID 1000 MG/10ML IV SOLN
1000.0000 mg | Freq: Once | INTRAVENOUS | Status: AC
  Administered 2015-10-24: 1000 mg via INTRAVENOUS
  Filled 2015-10-24: qty 10

## 2015-10-24 MED ORDER — ENOXAPARIN SODIUM 40 MG/0.4ML ~~LOC~~ SOLN: 40.0000 mg | Freq: Every day | SUBCUTANEOUS | Status: DC

## 2015-10-24 MED ORDER — ONDANSETRON HCL 4 MG PO TABS: 4.0000 mg | ORAL_TABLET | Freq: Four times a day (QID) | ORAL | Status: DC | PRN

## 2015-10-24 MED ORDER — METOCLOPRAMIDE HCL 5 MG PO TABS
5.0000 mg | ORAL_TABLET | Freq: Three times a day (TID) | ORAL | Status: DC | PRN
Start: 2015-10-24 — End: 2015-10-26

## 2015-10-24 MED ORDER — DEXAMETHASONE SODIUM PHOSPHATE 10 MG/ML IJ SOLN
10.0000 mg | Freq: Once | INTRAMUSCULAR | Status: AC
  Administered 2015-10-25: 10 mg via INTRAVENOUS
  Filled 2015-10-24: qty 1

## 2015-10-24 MED ORDER — ONDANSETRON HCL 4 MG/2ML IJ SOLN
INTRAMUSCULAR | Status: DC | PRN
  Administered 2015-10-24: 4 mg via INTRAVENOUS

## 2015-10-24 MED ORDER — FENOFIBRATE 160 MG PO TABS
160.0000 mg | ORAL_TABLET | Freq: Every day | ORAL | Status: DC
  Administered 2015-10-24 – 2015-10-26 (×3): 160 mg via ORAL
  Filled 2015-10-24 (×3): qty 1

## 2015-10-24 MED ORDER — 0.9 % SODIUM CHLORIDE (POUR BTL) OPTIME
TOPICAL | Status: DC | PRN
  Administered 2015-10-24: 1000 mL

## 2015-10-24 MED ORDER — SPIRONOLACTONE 25 MG PO TABS
25.0000 mg | ORAL_TABLET | ORAL | Status: DC
  Administered 2015-10-25: 25 mg via ORAL
  Filled 2015-10-24 (×2): qty 1

## 2015-10-24 MED ORDER — MIDAZOLAM HCL 5 MG/5ML IJ SOLN
INTRAMUSCULAR | Status: DC | PRN
  Administered 2015-10-24: 2 mg via INTRAVENOUS

## 2015-10-24 MED ORDER — MORPHINE SULFATE (PF) 2 MG/ML IV SOLN: 1.0000 mg | INTRAVENOUS | Status: DC | PRN

## 2015-10-24 MED ORDER — SORBITOL 70 % SOLN: 30.0000 mL | Freq: Every day | Status: DC | PRN

## 2015-10-24 MED ORDER — FENTANYL CITRATE (PF) 100 MCG/2ML IJ SOLN
INTRAMUSCULAR | Status: AC
  Filled 2015-10-24: qty 2

## 2015-10-24 MED ORDER — LISINOPRIL 20 MG PO TABS
20.0000 mg | ORAL_TABLET | Freq: Every day | ORAL | Status: DC
  Administered 2015-10-24 – 2015-10-26 (×2): 20 mg via ORAL
  Filled 2015-10-24 (×2): qty 1

## 2015-10-24 MED ORDER — ALPRAZOLAM 0.25 MG PO TABS: 0.2500 mg | ORAL_TABLET | Freq: Two times a day (BID) | ORAL | Status: DC | PRN

## 2015-10-24 MED ORDER — GLIMEPIRIDE 4 MG PO TABS
2.0000 mg | ORAL_TABLET | Freq: Every day | ORAL | Status: DC
  Administered 2015-10-25 – 2015-10-26 (×2): 2 mg via ORAL
  Filled 2015-10-24 (×2): qty 1

## 2015-10-24 MED ORDER — OXYCODONE HCL 5 MG PO TABS: 5.0000 mg | ORAL_TABLET | ORAL | Status: DC | PRN

## 2015-10-24 MED ORDER — SODIUM CHLORIDE 0.9 % IV SOLN
INTRAVENOUS | Status: DC
Start: 2015-10-24 — End: 2015-10-26
  Administered 2015-10-24 – 2015-10-25 (×3): via INTRAVENOUS

## 2015-10-24 MED ORDER — PROPOFOL 10 MG/ML IV BOLUS
INTRAVENOUS | Status: DC | PRN
  Administered 2015-10-24: 20 mg via INTRAVENOUS
  Administered 2015-10-24: 10 mg via INTRAVENOUS

## 2015-10-24 MED ORDER — EPHEDRINE 5 MG/ML INJ
INTRAVENOUS | Status: AC
  Filled 2015-10-24: qty 10

## 2015-10-24 MED ORDER — ONDANSETRON HCL 4 MG/2ML IJ SOLN: 4.0000 mg | Freq: Once | INTRAMUSCULAR | Status: DC | PRN

## 2015-10-24 MED ORDER — ENOXAPARIN SODIUM 40 MG/0.4ML ~~LOC~~ SOLN
40.0000 mg | SUBCUTANEOUS | Status: DC
  Administered 2015-10-25 – 2015-10-26 (×2): 40 mg via SUBCUTANEOUS
  Filled 2015-10-24 (×2): qty 0.4

## 2015-10-24 MED ORDER — METOCLOPRAMIDE HCL 5 MG/ML IJ SOLN
5.0000 mg | Freq: Three times a day (TID) | INTRAMUSCULAR | Status: DC | PRN
Start: 2015-10-24 — End: 2015-10-26

## 2015-10-24 MED ORDER — HYOSCYAMINE SULFATE ER 0.375 MG PO TB12
0.3750 mg | ORAL_TABLET | Freq: Two times a day (BID) | ORAL | Status: DC
  Administered 2015-10-24 – 2015-10-26 (×5): 0.375 mg via ORAL
  Filled 2015-10-24 (×6): qty 1

## 2015-10-24 MED ORDER — PHENYLEPHRINE HCL 10 MG/ML IJ SOLN
INTRAMUSCULAR | Status: DC | PRN
  Administered 2015-10-24 (×5): 80 ug via INTRAVENOUS

## 2015-10-24 MED ORDER — DEXTROSE 5 % IV SOLN
500.0000 mg | Freq: Four times a day (QID) | INTRAVENOUS | Status: DC | PRN
  Filled 2015-10-24: qty 5

## 2015-10-24 MED ORDER — CEFAZOLIN SODIUM-DEXTROSE 2-4 GM/100ML-% IV SOLN
2.0000 g | Freq: Four times a day (QID) | INTRAVENOUS | Status: AC
  Administered 2015-10-24 (×2): 2 g via INTRAVENOUS
  Filled 2015-10-24 (×2): qty 100

## 2015-10-24 MED ORDER — METHOCARBAMOL 500 MG PO TABS
500.0000 mg | ORAL_TABLET | Freq: Four times a day (QID) | ORAL | Status: DC | PRN
  Administered 2015-10-24 – 2015-10-26 (×3): 500 mg via ORAL
  Filled 2015-10-24 (×3): qty 1

## 2015-10-24 MED ORDER — ONDANSETRON HCL 4 MG/2ML IJ SOLN: 4.0000 mg | Freq: Four times a day (QID) | INTRAMUSCULAR | Status: DC | PRN

## 2015-10-24 MED ORDER — POVIDONE-IODINE 10 % EX SOLN
CUTANEOUS | Status: DC | PRN
  Administered 2015-10-24: 1 via TOPICAL

## 2015-10-24 MED ORDER — LIDOCAINE 2% (20 MG/ML) 5 ML SYRINGE
INTRAMUSCULAR | Status: AC
  Filled 2015-10-24: qty 5

## 2015-10-24 MED ORDER — MENTHOL 3 MG MT LOZG: 1.0000 | LOZENGE | OROMUCOSAL | Status: DC | PRN

## 2015-10-24 MED ORDER — PANTOPRAZOLE SODIUM 20 MG PO TBEC
20.0000 mg | DELAYED_RELEASE_TABLET | Freq: Every day | ORAL | Status: DC
Start: 2015-10-24 — End: 2015-10-26
  Administered 2015-10-24 – 2015-10-26 (×3): 20 mg via ORAL
  Filled 2015-10-24 (×3): qty 1

## 2015-10-24 MED ORDER — ONDANSETRON HCL 4 MG PO TABS: 4.0000 mg | ORAL_TABLET | Freq: Three times a day (TID) | ORAL | Status: DC | PRN

## 2015-10-24 SURGICAL SUPPLY — 41 items
CAPT HIP TOTAL 2 ×1 IMPLANT
CELLS DAT CNTRL 66122 CELL SVR (MISCELLANEOUS) ×1 IMPLANT
COVER SURGICAL LIGHT HANDLE (MISCELLANEOUS) ×2 IMPLANT
DRAPE C-ARM 42X72 X-RAY (DRAPES) ×2 IMPLANT
DRAPE IMP U-DRAPE 54X76 (DRAPES) ×2 IMPLANT
DRAPE STERI IOBAN 125X83 (DRAPES) ×2 IMPLANT
DRAPE U-SHAPE 47X51 STRL (DRAPES) ×6 IMPLANT
DRSG AQUACEL AG ADV 3.5X10 (GAUZE/BANDAGES/DRESSINGS) ×2 IMPLANT
ELECT BLADE 4.0 EZ CLEAN MEGAD (MISCELLANEOUS) ×2
ELECT REM PT RETURN 9FT ADLT (ELECTROSURGICAL) ×2
ELECTRODE BLDE 4.0 EZ CLN MEGD (MISCELLANEOUS) ×1 IMPLANT
ELECTRODE REM PT RTRN 9FT ADLT (ELECTROSURGICAL) ×1 IMPLANT
GLOVE SURG SYN 7.5  E (GLOVE) ×1
GLOVE SURG SYN 7.5 E (GLOVE) ×1 IMPLANT
GLOVE SURG SYN 7.5 PF PI (GLOVE) ×1 IMPLANT
GOWN SRG XL XLNG 56XLVL 4 (GOWN DISPOSABLE) ×1 IMPLANT
GOWN STRL NON-REIN XL XLG LVL4 (GOWN DISPOSABLE) ×2
GOWN STRL REUS W/ TWL LRG LVL3 (GOWN DISPOSABLE) IMPLANT
GOWN STRL REUS W/TWL LRG LVL3 (GOWN DISPOSABLE) ×2
HANDPIECE INTERPULSE COAX TIP (DISPOSABLE) ×2
IV NS 1000ML (IV SOLUTION) ×2
IV NS 1000ML BAXH (IV SOLUTION) ×1 IMPLANT
IV NS IRRIG 3000ML ARTHROMATIC (IV SOLUTION) ×2 IMPLANT
KIT BASIN OR (CUSTOM PROCEDURE TRAY) ×2 IMPLANT
MARKER SKIN DUAL TIP RULER LAB (MISCELLANEOUS) ×2 IMPLANT
NS IRRIG 1000ML POUR BTL (IV SOLUTION) ×1 IMPLANT
PACK TOTAL JOINT (CUSTOM PROCEDURE TRAY) ×2 IMPLANT
RETRACTOR WND ALEXIS 18 MED (MISCELLANEOUS) ×1 IMPLANT
RTRCTR WOUND ALEXIS 18CM MED (MISCELLANEOUS) ×2
SAW OSC TIP CART 19.5X105X1.3 (SAW) ×2 IMPLANT
SCRUB BETADINE 4OZ XXX (MISCELLANEOUS) ×1 IMPLANT
SEALER BIPOLAR AQUA 6.0 (INSTRUMENTS) ×1 IMPLANT
SET HNDPC FAN SPRY TIP SCT (DISPOSABLE) ×1 IMPLANT
SUT ETHIBOND 2 V 37 (SUTURE) ×2 IMPLANT
SUT ETHIBOND NAB CT1 #1 30IN (SUTURE) ×6 IMPLANT
SUT MNCRL AB 4-0 PS2 18 (SUTURE) ×1 IMPLANT
SUT VIC AB 1 CT1 27 (SUTURE) ×2
SUT VIC AB 1 CT1 27XBRD ANBCTR (SUTURE) ×1 IMPLANT
SUT VIC AB 2-0 CT1 27 (SUTURE) ×4
SUT VIC AB 2-0 CT1 TAPERPNT 27 (SUTURE) ×1 IMPLANT
TOWEL OR 17X26 10 PK STRL BLUE (TOWEL DISPOSABLE) ×2 IMPLANT

## 2015-10-24 NOTE — Progress Notes (Signed)
Feels much better, tells nurse and dr judd she feels better and "it's not as bad"

## 2015-10-24 NOTE — Progress Notes (Signed)
C/o vague chest tightness/ dr Jerilynn Mages judd at bedside, ekg ordered.

## 2015-10-24 NOTE — Anesthesia Postprocedure Evaluation (Signed)
Anesthesia Post Note  Patient: Beth Shepard  Procedure(s) Performed: Procedure(s) (LRB): RIGHT TOTAL HIP ARTHROPLASTY ANTERIOR APPROACH (Right)  Patient location during evaluation: PACU Anesthesia Type: Spinal Level of consciousness: awake and alert Pain management: pain level controlled Vital Signs Assessment: post-procedure vital signs reviewed and stable Respiratory status: spontaneous breathing, nonlabored ventilation, respiratory function stable and patient connected to nasal cannula oxygen Cardiovascular status: blood pressure returned to baseline and stable Postop Assessment: no signs of nausea or vomiting Anesthetic complications: no Comments: See quick note in anesthesia record. Chest pain now completely resolved. EKG unchanged. Enzymes being cycled.     Last Vitals:  Filed Vitals:   10/24/15 1003 10/24/15 1030  BP: 111/65 108/60  Pulse: 60 62  Temp:    Resp: 12 13    Last Pain:  Filed Vitals:   10/24/15 1046  PainSc: Asleep                 Jylan Loeza JENNETTE

## 2015-10-24 NOTE — Progress Notes (Signed)
Pt tells dr m judd it "feels like an elephant sitting on my chest" / no change in ekg/ or ccm , vs stable, less restless after one dose fentanyl

## 2015-10-24 NOTE — Discharge Instructions (Signed)

## 2015-10-24 NOTE — Transfer of Care (Signed)
Immediate Anesthesia Transfer of Care Note  Patient: Beth Shepard  Procedure(s) Performed: Procedure(s): RIGHT TOTAL HIP ARTHROPLASTY ANTERIOR APPROACH (Right)  Patient Location: PACU  Anesthesia Type:MAC and Spinal  Level of Consciousness: awake, alert , oriented and patient cooperative  Airway & Oxygen Therapy: Patient Spontanous Breathing and Patient connected to nasal cannula oxygen  Post-op Assessment: Report given to RN and Post -op Vital signs reviewed and stable  Post vital signs: Reviewed  Last Vitals:  Filed Vitals:   10/24/15 0619  BP: 147/76  Pulse: 88  Temp: 36.8 C  Resp: 20    Last Pain:  Filed Vitals:   10/24/15 0620  PainSc: 6          Complications: No apparent anesthesia complications

## 2015-10-24 NOTE — Op Note (Signed)
RIGHT TOTAL HIP ARTHROPLASTY ANTERIOR APPROACH  Procedure Note Kenda Nakasone   [id number]  Pre-op Diagnosis: Right hip degenerative joint disease     Post-op Diagnosis: same   Operative Procedures  1. Total hip replacement; Right hip; uncemented [id number]   Personnel  Surgeon(s): Leandrew Koyanagi, MD   Anesthesia: spinal  Prosthesis: Depuy Acetabulum: Pinnacle 52 mm Femur: Corail KLA 12 Head: 36 size: +5 Liner: neutral Bearing Type: ceramic on poly  Date of Service: 10/24/2015  Total Hip Arthroplasty (Anterior Approach) Op Note:  After informed consent was obtained and the operative extremity marked in the holding area, the patient was brought back to the operating room and placed supine on the HANA table. Next, the operative extremity was prepped and draped in normal sterile fashion. Surgical timeout occurred verifying patient identification, surgical site, surgical procedure and administration of antibiotics.  A modified anterior Smith-Peterson approach to the hip was performed, using the interval between tensor fascia lata and sartorius.  Dissection was carried bluntly down onto the anterior hip capsule. The lateral femoral circumflex vessels were identified and coagulated. A capsulotomy was performed and the capsular flaps tagged for later repair.  Fluoroscopy was utilized to prepare for the femoral neck cut. The neck osteotomy was performed. The femoral head was removed, the acetabular rim was cleared of soft tissue and attention was turned to reaming the acetabulum.  Sequential reaming was performed under fluoroscopic guidance. We reamed to a size 51 mm, and then impacted the acetabular shell. The liner was then placed after irrigation and attention turned to the femur.  After placing the femoral hook, the leg was taken to externally rotated, extended and adducted position taking care to perform soft tissue releases to allow for adequate mobilization of the femur. Soft tissue was  cleared from the shoulder of the greater trochanter and the hook elevator used to improve exposure of the proximal femur. Sequential broaching performed up to a size 12. Trial neck and head were placed. The leg was brought back up to neutral and the construct reduced. The position and sizing of components, offset and leg lengths were checked using fluoroscopy. Stability of the construct was checked in extension and external rotation without any subluxation or impingement of prosthesis. We dislocated the prosthesis, dropped the leg back into position, removed trial components, and irrigated copiously. The final stem and head was then placed, the leg brought back up, the system reduced and fluoroscopy used to verify positioning.  We irrigated, obtained hemostasis and closed the capsule using #2 ethibond suture.  Dilute betadyne solution was used. The fascia was closed with #1 vicryl plus, the deep fat layer was closed with 0 vicryl, the subcutaneous layers closed with 2.0 Vicryl Plus and the skin closed with 4.0 monocryl and steri strips. A sterile dressing was applied. The patient was awakened in the operating room and taken to recovery in stable condition.  All sponge, needle, and instrument counts were correct at the end of the case.   Position: supine  Complications: none.  Time Out: performed   Drains/Packing: none  Estimated blood loss: 150 cc  Returned to Recovery Room: in good condition.   Antibiotics: yes   Mechanical VTE (DVT) Prophylaxis: sequential compression devices, TED thigh-high  Chemical VTE (DVT) Prophylaxis: lovenox   Fluid Replacement: see anesthesia record  Specimens Removed: 1 to pathology   Sponge and Instrument Count Correct? yes   PACU: portable radiograph - low AP   Admission: inpatient status, start PT & OT  POD#1  Plan/RTC: Return in 2 weeks for staple removal. Return in 6 weeks to see MD.  Weight Bearing/Load Lower Extremity: full  Hip precautions:  none Suture Removal: 10-14 days  Betadine to incision twice daily once dressing is removed on POD#7  N. Eduard Roux, MD [place] 9:22 AM      Implant Name Type Inv. Item Serial No. Manufacturer Lot No. LRB No. Used  PIN SECTOR W/GRIP ACE CUP 52MM - [id number] Hips PIN SECTOR W/GRIP ACE CUP 52MM  DEPUY [id number] Right 1  STEM CORAIL KLA12 - [id number] Stem STEM CORAIL KLA12  DEPUY [id number] Right 1  LINER ACETAB NEUTRAL 36ID 520D - [id number] Liner LINER ACETAB NEUTRAL 36ID 520D  DEPUY [id number] Right 1  HEAD CERAMIC 36 PLUS5 - [id number] Hips HEAD CERAMIC 36 PLUS5   DEPUY [id number] Right 1

## 2015-10-24 NOTE — Progress Notes (Signed)
Orthopedic Surgery Postop Note  Subjective: - no acute events  Objective: - AFVSS - Dressing clean/dry/intact - NVI - WWP, 2+ distal pulses  Assessment:  - s/p R DA THA  Plan: 1. Up with PT 2. NVI RLE 3. Pain controlled 4. Patient wishes to go to [facility] post discharge  N. Eduard Roux, MD 10/24/2015 12:21 PM

## 2015-10-24 NOTE — Anesthesia Procedure Notes (Addendum)
Spinal Patient location during procedure: OR Staffing Anesthesiologist: JUDD, MARY Performed by: anesthesiologist  Preanesthetic Checklist Completed: patient identified, site marked, surgical consent, pre-op evaluation, timeout performed, IV checked, risks and benefits discussed and monitors and equipment checked Spinal Block Patient position: sitting Prep: ChloraPrep Patient monitoring: continuous pulse ox, blood pressure and heart rate Approach: midline Location: L3-4 Injection technique: single-shot Needle Needle type: Pencan  Needle gauge: 24 G Needle length: 9 cm Additional Notes Functioning IV was confirmed and monitors were applied. Sterile prep and drape, including hand hygiene, mask and sterile gloves were used. The patient was positioned and the spine was prepped. The skin was anesthetized with lidocaine.  Free flow of clear CSF was obtained prior to injecting local anesthetic into the CSF.  The spinal needle aspirated freely following injection.  The needle was carefully withdrawn.  The patient tolerated the procedure well. Consent was obtained prior to procedure with all questions answered and concerns addressed. Risks including but not limited to bleeding, infection, nerve damage, paralysis, failed block, inadequate analgesia, allergic reaction, high spinal, itching and headache were discussed and the patient wished to proceed.   Lauretta Grill, MD    Procedure Name: [facility] Date/Time: 10/24/2015 7:30 AM Performed by: Jenne Campus Pre-anesthesia Checklist: Patient identified, Emergency Drugs available, Suction available, Patient being monitored and Timeout performed Patient Re-evaluated:Patient Re-evaluated prior to inductionOxygen Delivery Method: Simple face mask

## 2015-10-24 NOTE — Evaluation (Signed)
Physical Therapy Evaluation Patient Details Name: Beth Shepard MRN: [id number] DOB: 1956/03/16 Today's Date: 10/24/2015   History of Present Illness  60 y.o. female admitted to [facility] on 10/24/15 for elective R direct anterior THA.  Pt with significant PMHx of COPD, DM, CHF, RA, and L knee arthroscopy.  Clinical Impression  Pt is POD #0 and is moving well with min assist to transfer to [facility] and walk a short distance to the recliner chair. Pt lives alone and would benefit from SNF level rehab at discharge.   PT to follow acutely for deficits listed below.      Follow Up Recommendations SNF    Equipment Recommendations  Rolling walker with 5" wheels;3in1 (PT)    Recommendations for Other Services   NA    Precautions / Restrictions Precautions Precautions: Fall Precaution Comments: pt is a bit unsteady on her feet, mild posterior tipping Restrictions Weight Bearing Restrictions: Yes RLE Weight Bearing: Weight bearing as tolerated      Mobility  Bed Mobility Overal bed mobility: Needs Assistance Bed Mobility: Supine to Sit     Supine to sit: Min assist;HOB elevated     General bed mobility comments: Min assist to help progress right leg over EOB.   Transfers Overall transfer level: Needs assistance Equipment used: Rolling walker (2 wheeled);None Transfers: Sit to/from [employer] to Stand: Min assist Stand pivot transfers: Min assist       General transfer comment: Min assist to stand both with and without RW. Verbal cues for safe hand placement.  Pivotal steps to [facility].  Ambulation/Gait Ambulation/Gait assistance: Min assist Ambulation Distance (Feet): 8 Feet Assistive device: Rolling walker (2 wheeled) Gait Pattern/deviations: Step-to pattern;Antalgic     General Gait Details: Pt with moderately antalgic gait pattern.  Verbal cues for LE sequencing and encouragement         Balance Overall balance assessment: Needs assistance Sitting-balance  support: Feet supported;No upper extremity supported Sitting balance-Leahy Scale: Good     Standing balance support: Bilateral upper extremity supported Standing balance-Leahy Scale: Poor                               Pertinent Vitals/Pain Pain Assessment: Faces Faces Pain Scale: Hurts whole lot Pain Location: right hip Pain Descriptors / Indicators: Grimacing;Guarding Pain Intervention(s): Limited activity within patient's tolerance;Monitored during session;Repositioned;RN gave pain meds during session;Patient requesting pain meds-RN notified    Home Living Family/patient expects to be discharged to:: Skilled nursing facility (for rehab) Living Arrangements: Alone                             Extremity/Trunk Assessment   Upper Extremity Assessment: Defer to OT evaluation           Lower Extremity Assessment: RLE deficits/detail RLE Deficits / Details: right leg with normal post op pain and weakness, ankle 3/5, knee 3-/5, hip 2/5    Cervical / Trunk Assessment: Normal  Communication      Cognition Arousal/Alertness: Lethargic;Suspect due to medications Behavior During Therapy: [facility] for tasks assessed/performed Overall Cognitive Status: Within Functional Limits for tasks assessed                               Assessment/Plan    PT Assessment Patient needs continued PT services  PT Diagnosis Difficulty walking;Abnormality of gait;Generalized weakness;Acute pain  PT Problem List Decreased strength;Decreased range of motion;Decreased activity tolerance;Decreased balance;Decreased mobility;Decreased knowledge of use of DME;Pain  PT Treatment Interventions DME instruction;Gait training;Stair training;Functional mobility training;Therapeutic activities;Therapeutic exercise;Balance training;Neuromuscular re-education;Patient/family education;Manual techniques;Modalities   PT Goals (Current goals can be found in the Care Plan section) Acute  Rehab PT Goals Patient Stated Goal: to go to rehab before home PT Goal Formulation: With patient Time For Goal Achievement: 10/31/15 Potential to Achieve Goals: Good    Frequency 7X/week   Barriers to discharge Decreased caregiver support pt lives alone       End of Session   Activity Tolerance: Patient limited by pain Patient left: in chair;with call bell/phone within reach           Time: [id number] PT Time Calculation (min) (ACUTE ONLY): 26 min   Charges:   PT Evaluation $PT Eval Moderate Complexity: 1 Procedure PT Treatments $Therapeutic Activity: 8-22 mins        Kasumi Ditullio B. [place], [place], DPT [phone]   10/24/2015, 5:39 PM

## 2015-10-25 ENCOUNTER — Encounter (HOSPITAL_COMMUNITY): Payer: Self-pay | Admitting: Orthopaedic Surgery

## 2015-10-25 LAB — BASIC METABOLIC PANEL
ANION GAP: 8 (ref 5–15)
BUN: 13 mg/dL (ref 6–20)
BUN: 13 mg/dL (ref 4–21)
CALCIUM: 8.6 mg/dL — AB (ref 8.9–10.3)
CO2: 22 mmol/L (ref 22–32)
Chloride: 109 mmol/L (ref 101–111)
Creatinine, Ser: 1.22 mg/dL — ABNORMAL HIGH (ref 0.44–1.00)
Creatinine: 1.2 mg/dL — AB (ref 0.5–1.1)
GFR calc non Af Amer: 46 mL/min — ABNORMAL LOW (ref >60)
GFR, EST AFRICAN AMERICAN: 54 mL/min — AB (ref >60)
GLUCOSE: 125 mg/dL — AB (ref 65–99)
Glucose: 125 mg/dL
Potassium: 3.7 mmol/L (ref 3.5–5.1)
Sodium: 139 mmol/L (ref 135–145)
Sodium: 139 mmol/L (ref 137–147)

## 2015-10-25 LAB — HEMOGLOBIN AND HEMATOCRIT, BLOOD
HEMATOCRIT: 30.7 % — AB (ref 36.0–46.0)
HEMOGLOBIN: 9.8 g/dL — AB (ref 12.0–15.0)

## 2015-10-25 LAB — CBC
HEMATOCRIT: 25.5 % — AB (ref 36.0–46.0)
HEMOGLOBIN: 7.9 g/dL — AB (ref 12.0–15.0)
MCH: 27.2 pg (ref 26.0–34.0)
MCHC: 31 g/dL (ref 30.0–36.0)
MCV: 87.9 fL (ref 78.0–100.0)
Platelets: 239 10*3/uL (ref 150–400)
RBC: 2.9 MIL/uL — ABNORMAL LOW (ref 3.87–5.11)
RDW: 13.7 % (ref 11.5–15.5)
WBC: 7.5 10*3/uL (ref 4.0–10.5)

## 2015-10-25 LAB — GLUCOSE, CAPILLARY
GLUCOSE-CAPILLARY: 130 mg/dL — AB (ref 65–99)
GLUCOSE-CAPILLARY: 160 mg/dL — AB (ref 65–99)
Glucose-Capillary: 115 mg/dL — ABNORMAL HIGH (ref 65–99)
Glucose-Capillary: 97 mg/dL (ref 65–99)
Glucose-Capillary: 133 mg/dL — ABNORMAL HIGH (ref 65–99)
Glucose-Capillary: 193 mg/dL — ABNORMAL HIGH (ref 65–99)

## 2015-10-25 LAB — PREPARE RBC (CROSSMATCH)

## 2015-10-25 LAB — TROPONIN T

## 2015-10-25 MED ORDER — SODIUM CHLORIDE 0.9 % IV SOLN: Freq: Once | INTRAVENOUS | Status: DC

## 2015-10-25 NOTE — Care Management Note (Signed)
Case Management Note  Patient Details  Name: Beth Shepard MRN: [id number] Date of Birth: 10/24/1955  Subjective/Objective:         Right total knee arthroplasty           Action/Plan: PT/OT recommending SNF. Referral made to CSW, [place] working on SNF placement for short term rehab. Will continue to follow.  Expected Discharge Date:                  Expected Discharge Plan:  Skilled Nursing Facility  In-House Referral:  Clinical Social Work  Discharge planning Services  CM Consult  Post Acute Care Choice:    Choice offered to:     DME Arranged:  N/A DME Agency:     HH Arranged:  NA HH Agency:     Status of Service:  In process, will continue to follow  Medicare Important Message Given:    Date Medicare IM Given:    Medicare IM give by:    Date Additional Medicare IM Given:    Additional Medicare Important Message give by:     If discussed at [place] of Stay Meetings, dates discussed:    Additional Comments:  Nila Nephew, RN 10/25/2015, 2:31 PM

## 2015-10-25 NOTE — Clinical Social Work Note (Signed)
Clinical Social Work Assessment  Patient Details  Name: Beth Shepard MRN: [id number] Date of Birth: 1955-10-31  Date of referral:  10/25/15               Reason for consult:  Facility Placement                Permission sought to share information with:  Chartered certified accountant granted to share information::  Yes, Verbal Permission Granted  Name::        Agency::   [facility] SNF)  Relationship::     Contact Information:     Housing/Transportation Living arrangements for the past 2 months:  [place] of Information:  Patient Patient Interpreter Needed:  None Criminal Activity/Legal Involvement Pertinent to Current Situation/Hospitalization:  No - Comment as needed Significant Relationships:  Friend Lives with:  Self Do you feel safe going back to the place where you live?    Need for family participation in patient care:  No (Coment)  Care giving concerns:  No caregivers present at bedside.   Social Worker assessment / plan:  CSW met with patient at bedside to review discharge plans.  PT recommends SNF at time of discharge.  Patient states she has contacted her insurance company and wishes to be discharged to [facility].  Patient states she is from home with her four cats.  Patient states she is "kind of a loner" and rarely has company at home.  CSW will send referrals to [facility] and present bed offers as they become available.  Employment status:    Forensic scientist:   Sports administrator) PT Recommendations:  [place] / Referral to community resources:  [place]  Patient/Family's Response to care:  Patient is agreeable to SNF placement.  Patient/Family's Understanding of and Emotional Response to Diagnosis, Current Treatment, and Prognosis:  Patient presented as realistic regarding needed therapies but expressed a great desire to return home.  Patient vented to how  frustrating it is to be away from home. CSW offered support.  Emotional Assessment Appearance:  Appears stated age Attitude/Demeanor/Rapport:    Affect (typically observed):  Accepting, Adaptable Orientation:  Oriented to Self, Oriented to  Time, Oriented to Place, Oriented to Situation Alcohol / Substance use:    Psych involvement (Current and /or in the community):  No (Comment)  Discharge Needs  Concerns to be addressed:  No discharge needs identified Readmission within the last 30 days:  No Current discharge risk:  None Barriers to Discharge:  Continued Medical Work up   [employer], LCSW 10/25/2015, 10:27 AM

## 2015-10-25 NOTE — Clinical Social Work Placement (Signed)
   CLINICAL SOCIAL WORK PLACEMENT  NOTE  Date:  10/25/2015  Patient Details  Name: Beth Shepard MRN: [id number] Date of Birth: 1955-10-20  Clinical Social Work is seeking post-discharge placement for this patient at the [place] level of care (*CSW will initial, date and re-position this form in  chart as items are completed):  Yes   Patient/family provided with [place] Work Department's list of facilities offering this level of care within the geographic area requested by the patient (or if unable, by the patient's family).  Yes   Patient/family informed of their freedom to choose among providers that offer the needed level of care, that participate in Medicare, Medicaid or managed care program needed by the patient, have an available bed and are willing to accept the patient.  Yes   Patient/family informed of [place]'s ownership interest in [facility] and [facility], as well as of the fact that they are under no obligation to receive care at these facilities.  PASRR submitted to EDS on 10/25/15     PASRR number received on 10/25/15     Existing PASRR number confirmed on       FL2 transmitted to all facilities in geographic area requested by pt/family on 10/25/15     FL2 transmitted to all facilities within larger geographic area on       Patient informed that his/her managed care company has contracts with or will negotiate with certain facilities, including the following:            Patient/family informed of bed offers received.  Patient chooses bed at       Physician recommends and patient chooses bed at      Patient to be transferred to   on  .  Patient to be transferred to facility by       Patient family notified on   of transfer.  Name of family member notified:  patient alert and oriented x4 and has no family she would like for CSW to contact at time of discharge     PHYSICIAN Please sign FL2     Additional  Comment:    _______________________________________________ Dulcy Fanny, LCSW 10/25/2015, 11:10 AM

## 2015-10-25 NOTE — Evaluation (Signed)
Occupational Therapy Evaluation Patient Details Name: Beth Shepard MRN: [id number] DOB: 1956-05-28 Today's Date: 10/25/2015    History of Present Illness 60 y.o. female admitted to [facility] on 10/24/15 for elective R direct anterior THA.  Pt with significant PMHx of COPD, DM, CHF, RA, and L knee arthroscopy.   Clinical Impression   This 60 yo female admitted and underwent above presents to acute OT with deficits below (see OT problem list) affecting her ability to care for herself at home since she lives alone. She will benefit from acute OT with follow up OT at SNF to get to an independent to Mod I to return home alone.    Follow Up Recommendations  SNF    Equipment Recommendations   (TBD next venue)       Precautions / Restrictions Precautions Precautions: Fall Precaution Comments: pt is a bit unsteady on her feet, mild posterior tipping Restrictions Weight Bearing Restrictions: No RLE Weight Bearing: Weight bearing as tolerated      Mobility Bed Mobility Overal bed mobility: Needs Assistance Bed Mobility: Sit to Supine       Sit to supine: Mod assist (for LEs)    Transfers Overall transfer level: Needs assistance Equipment used: Rolling walker (2 wheeled) Transfers: Sit to/from Stand Sit to Stand: Min assist          Balance Overall balance assessment: Needs assistance Sitting-balance support: No upper extremity supported;Feet supported Sitting balance-Leahy Scale: Good     Standing balance support: Bilateral upper extremity supported Standing balance-Leahy Scale: Poor Standing balance comment: Reliant on RW                            ADL Overall ADL's : Needs assistance/impaired Eating/Feeding: Independent;Sitting   Grooming: Wash/dry hands;Min guard;Standing   Upper Body Bathing: Set up;Sitting   Lower Body Bathing: Maximal assistance (min A sit<>stand with increased time)   Upper Body Dressing : Set up;Sitting   Lower Body Dressing:  Maximal assistance (min A sit<>stand)   Toilet Transfer: Minimal assistance;Ambulation;RW;BSC (over toilet)   Toileting- Clothing Manipulation and Hygiene: Min guard (min A sit<>stand)                         Pertinent Vitals/Pain Pain Assessment: 0-10 Pain Score: 4  Faces Pain Scale: Hurts little more Pain Location: right hip with movement Pain Descriptors / Indicators: Grimacing;Guarding;Sore Pain Intervention(s): Monitored during session;Repositioned     Hand Dominance Right   Extremity/Trunk Assessment Upper Extremity Assessment Upper Extremity Assessment: Generalized weakness           Communication Communication Communication: No difficulties   Cognition Arousal/Alertness: Awake/alert Behavior During Therapy: WFL for tasks assessed/performed Overall Cognitive Status: Within Functional Limits for tasks assessed                                Home Living Family/patient expects to be discharged to:: Skilled nursing facility Living Arrangements: Alone                                           OT Diagnosis: Generalized weakness;Acute pain   OT Problem List: Decreased strength;Decreased range of motion;Impaired balance (sitting and/or standing);Pain;Obesity;Decreased knowledge of use of DME or AE   OT Treatment/Interventions: Self-care/ADL training;Patient/family education;Balance training;Therapeutic  activities;DME and/or AE instruction    OT Goals(Current goals can be found in the care plan section) Acute Rehab OT Goals Patient Stated Goal: to go to rehab before home OT Goal Formulation: With patient Time For Goal Achievement: 11/01/15 Potential to Achieve Goals: Good  OT Frequency: Min 2X/week   Barriers to D/C: Decreased caregiver support             End of Session Equipment Utilized During Treatment: Gait belt;Rolling walker  Activity Tolerance: Patient tolerated treatment well Patient left: in chair;with call  bell/phone within reach;with chair alarm set   Time: [id number] OT Time Calculation (min): 24 min Charges:  OT General Charges $OT Visit: 1 Procedure OT Evaluation $OT Eval Moderate Complexity: 1 Procedure OT Treatments $Self Care/Home Management : 8-22 mins  Almon Register [id number] 10/25/2015, 12:32 PM

## 2015-10-25 NOTE — Progress Notes (Addendum)
   Subjective:  Patient reports pain as mild.  Sitting up in chair.  Objective:   VITALS:   Filed Vitals:   10/24/15 1204 10/24/15 2038 10/24/15 2342 10/25/15 0620  BP: 132/70 98/47 115/52 122/48  Pulse: 70 67 65 65  Temp:  97.9 F (36.6 C) 97.5 F (36.4 C) 97.1 F (36.2 C)  TempSrc:  Oral Oral   Resp: 18 18 18 18   Weight:      SpO2: 98% 100% 100% 100%    Neurologically intact Neurovascular intact Sensation intact distally Intact pulses distally Dorsiflexion/Plantar flexion intact Incision: dressing C/D/I and no drainage No cellulitis present Compartment soft   Lab Results  Component Value Date   WBC 7.5 10/25/2015   HGB 7.9* 10/25/2015   HCT 25.5* 10/25/2015   MCV 87.9 10/25/2015   PLT 239 10/25/2015     Assessment/Plan:  1 Day Post-Op   - Expected postop acute blood loss anemia - will monitor for symptoms -  1 unit of RBCs ordered to be transfrused - Up with PT/OT - DVT ppx - SCDs, ambulation, lovenox - WBAT operative extremity - Pain control - Discharge planning - patient wishes to go to [facility]  Marianna Payment 10/25/2015, 7:56 AM [phone]

## 2015-10-25 NOTE — Progress Notes (Signed)
Physical Therapy Treatment Patient Details Name: Beth Shepard MRN: [id number] DOB: [birth year] Today's Date: 10/25/2015    History of Present Illness 60 y.o. female admitted to [facility] on 10/24/15 for elective R direct anterior THA.  Pt with significant PMHx of COPD, DM, CHF, RA, and L knee arthroscopy.    PT Comments    Pt performed increased gait distance remains to require min guard assist due to mild unsteadiness, weakness and noticeable fatigue.  Pt would benefit from skilled rehab in post acute setting to improve strength and mobility to prepare pt to d/c home safely.    Follow Up Recommendations  SNF     Equipment Recommendations  Rolling walker with 5" wheels;3in1 (PT)    Recommendations for Other Services       Precautions / Restrictions Precautions Precautions: Fall Precaution Comments: Pt remains unsteady, reports feeling weakness in B knees.  Restrictions Weight Bearing Restrictions: Yes RLE Weight Bearing: Weight bearing as tolerated    Mobility  Bed Mobility Overal bed mobility: Needs Assistance Bed Mobility: Sit to Supine       Sit to supine: Mod assist (for LEs)   General bed mobility comments: Pt received sitting on [facility] with nurse tech present.    Transfers Overall transfer level: Needs assistance Equipment used: Rolling walker (2 wheeled) Transfers: Sit to/from Stand Sit to Stand: Min guard Stand pivot transfers: Min guard       General transfer comment: Pt remains to shift weight on heel which causes mild unsteadiness with transfers into standing.    Ambulation/Gait Ambulation/Gait assistance: Min guard Ambulation Distance (Feet): 150 Feet Assistive device: Rolling walker (2 wheeled) Gait Pattern/deviations: Step-to pattern;Step-through pattern;Trunk flexed;Decreased stride length;Antalgic   Gait velocity interpretation: <1.8 ft/sec, indicative of risk for recurrent falls General Gait Details: Pt with moderately antalgic gait pattern.  Verbal  cues for LE sequencing and encouragement.  Cues for progression of step to pattern to step  through pattern.  pt required cues for pacing and forward gaze to improve postural awareness.     Stairs            Wheelchair Mobility    Modified Rankin (Stroke Patients Only)       Balance Overall balance assessment: Needs assistance Sitting-balance support: No upper extremity supported;Feet supported Sitting balance-Leahy Scale: Good     Standing balance support: Bilateral upper extremity supported Standing balance-Leahy Scale: Poor Standing balance comment: Reliant on RW                    Cognition Arousal/Alertness: Awake/alert Behavior During Therapy: WFL for tasks assessed/performed Overall Cognitive Status: Within Functional Limits for tasks assessed                      Exercises Total Joint Exercises Ankle Circles/Pumps: AROM;Both;10 reps;Supine Quad Sets: AROM;Both;10 reps;Supine Short Arc Quad: AAROM;Right;10 reps;Supine Heel Slides: AROM;Right;10 reps;Supine Hip ABduction/ADduction: Right;10 reps;Supine;AAROM;Standing (1x10 in supine and 1x10 in standing.) Straight Leg Raises: AAROM;Right;10 reps;Supine Long Arc Quad: AROM;Right;Seated;10 reps Knee Flexion: AROM;Standing;Right;10 reps Marching in Standing: AROM;Right;10 reps;Standing Standing Hip Extension: AROM;Right;10 reps;Standing    General Comments        Pertinent Vitals/Pain Pain Assessment: 0-10 Pain Score: 4  Faces Pain Scale: Hurts little more Pain Location: Lateral border of R quad.   Pain Descriptors / Indicators: Guarding;Grimacing Pain Intervention(s): Monitored during session;Ice applied;Repositioned    Home Living Family/patient expects to be discharged to:: Skilled nursing facility Living Arrangements: Alone  Prior Function            PT Goals (current goals can now be found in the care plan section) Acute Rehab PT Goals Patient Stated  Goal: to go to rehab before home Potential to Achieve Goals: Good Progress towards PT goals: Progressing toward goals    Frequency  7X/week    PT Plan      Co-evaluation             End of Session Equipment Utilized During Treatment: Gait belt Activity Tolerance: Patient limited by pain Patient left: in chair;with call bell/phone within reach     Time: 1439-1519 PT Time Calculation (min) (ACUTE ONLY): 40 min  Charges:  $Gait Training: 23-37 mins $Therapeutic Exercise: 8-22 mins                    G Codes:      Cristela Blue 11/08/2015, 3:24 PM  Governor [place], PTA pager [phone]

## 2015-10-25 NOTE — NC FL2 (Signed)
[place] MEDICAID FL2 LEVEL OF CARE SCREENING TOOL     IDENTIFICATION  Patient Name: Beth Shepard Birthdate: 1955-08-13 Sex: female Admission Date (Current Location): 10/24/2015  [facility] and [place] Number:  [employer] and Address:  [facility]. [facility], [place] [address], [place], [place] [address]      Provider Number: [id number]  Attending Physician Name and Address:  Leandrew Koyanagi, MD  Relative Name and Phone Number:       Current Level of Care: Hospital Recommended Level of Care: [place] Prior Approval Number:    Date Approved/Denied:   PASRR Number: [id number]  Discharge Plan: SNF    Current Diagnoses: Patient Active Problem List   Diagnosis Date Noted  . Osteoarthritis of right hip 10/24/2015  . Hip joint replacement status 10/24/2015  . Chest pain 08/25/2015  . Right hip pain 08/25/2015  . Chest pain, precordial 08/10/2015  . Cardiomyopathy ([place]) 01/18/2015  . Block, bundle branch, left 01/18/2015  . Nausea alone 12/17/2013  . Diarrhea 11/19/2013  . Bloating 11/19/2013  . Obesity (BMI 30-39.9) 10/21/2013  . Multiple joint pain 11/13/2012  . MYALGIA 08/29/2010  . ABDOMINAL PAIN OTHER SPECIFIED SITE 08/22/2009  . OSTEOARTHROS UNSPEC WHETHER GEN/LOC UNSPEC SITE 03/17/2009  . Cellulitis and abscess of face 12/17/2008  . Disturbance of skin sensation 12/17/2008  . ACUTE BRONCHITIS 05/07/2008  . ANXIETY 12/04/2007  . SINUSITIS, ACUTE NOS 12/19/2006  . DIABETES MELLITUS, TYPE II 10/08/2006  . HYPERLIPIDEMIA 10/08/2006  . HYPERTENSION 10/08/2006  . COPD 10/08/2006  . GERD 10/08/2006  . Other postprocedural status(V45.89) 10/08/2006    Orientation RESPIRATION BLADDER Height & Weight     Self, Time, Situation, Place  Normal Continent Weight: 194 lb (87.998 kg) Height:     BEHAVIORAL SYMPTOMS/MOOD NEUROLOGICAL BOWEL NUTRITION STATUS      Continent    AMBULATORY STATUS COMMUNICATION OF NEEDS Skin   Limited  Assist Verbally Surgical wounds                       Personal Care Assistance Level of Assistance  Dressing, Bathing Bathing Assistance: Independent Feeding assistance: Limited assistance Dressing Assistance: Limited assistance     Functional Limitations Info             SPECIAL CARE FACTORS FREQUENCY  PT (By licensed PT), OT (By licensed OT)     PT Frequency: daily OT Frequency: daily            Contractures Contractures Info: Not present    Additional Factors Info  Allergies   Allergies Info: Prednisone, Codeine, Ibuprofen           Current Medications (10/25/2015):  This is the current hospital active medication list Current Facility-Administered Medications  Medication Dose Route Frequency Provider Last Rate Last Dose  . 0.9 %  sodium chloride infusion   Intravenous Continuous Leandrew Koyanagi, MD 125 mL/hr at 10/24/15 2112    . 0.9 %  sodium chloride infusion   Intravenous Once Naiping Ephriam Jenkins, MD      . acetaminophen (TYLENOL) tablet 650 mg  650 mg Oral Q6H PRN Naiping Ephriam Jenkins, MD       Or  . acetaminophen (TYLENOL) suppository 650 mg  650 mg Rectal Q6H PRN Naiping Ephriam Jenkins, MD      . ALPRAZolam Duanne Moron) tablet 0.25 mg  0.25 mg Oral BID PRN Naiping Ephriam Jenkins, MD      . alum & mag hydroxide-simeth (  MAALOX/MYLANTA) 200-200-20 MG/5ML suspension 30 mL  30 mL Oral Q4H PRN Naiping Ephriam Jenkins, MD      . carvedilol (COREG) tablet 12.5 mg  12.5 mg Oral BID WC Naiping Ephriam Jenkins, MD   12.5 mg at 10/25/15 1026  . diphenhydrAMINE (BENADRYL) 12.5 MG/5ML elixir 25 mg  25 mg Oral Q4H PRN Naiping Ephriam Jenkins, MD      . enoxaparin (LOVENOX) injection 40 mg  40 mg Subcutaneous Q24H Naiping Ephriam Jenkins, MD   40 mg at 10/25/15 1026  . fenofibrate tablet 160 mg  160 mg Oral Daily Leandrew Koyanagi, MD   160 mg at 10/25/15 1026  . furosemide (LASIX) tablet 20 mg  20 mg Oral Daily Naiping Ephriam Jenkins, MD   20 mg at 10/25/15 1026  . glimepiride (AMARYL) tablet 2 mg  2 mg Oral Q breakfast Naiping Ephriam Jenkins, MD   2 mg at 10/25/15 1026  .  hyoscyamine (LEVBID) 0.375 MG 12 hr tablet 0.375 mg  0.375 mg Oral BID Leandrew Koyanagi, MD   0.375 mg at 10/25/15 1027  . insulin aspart (novoLOG) injection 0-15 Units  0-15 Units Subcutaneous TID WC Naiping Ephriam Jenkins, MD   2 Units at 10/24/15 1737  . insulin aspart (novoLOG) injection 0-5 Units  0-5 Units Subcutaneous QHS Leandrew Koyanagi, MD   0 Units at 10/24/15 2200  . ketorolac (TORADOL) 30 MG/ML injection 30 mg  30 mg Intravenous Q6H PRN Leandrew Koyanagi, MD   30 mg at 10/24/15 1100  . lisinopril (PRINIVIL,ZESTRIL) tablet 20 mg  20 mg Oral Daily Naiping Ephriam Jenkins, MD   20 mg at 10/24/15 1223  . magnesium citrate solution 1 Bottle  1 Bottle Oral Once PRN Naiping Ephriam Jenkins, MD      . menthol-cetylpyridinium (CEPACOL) lozenge 3 mg  1 lozenge Oral PRN Naiping Ephriam Jenkins, MD       Or  . phenol (CHLORASEPTIC) mouth spray 1 spray  1 spray Mouth/Throat PRN Naiping Ephriam Jenkins, MD      . methocarbamol (ROBAXIN) tablet 500 mg  500 mg Oral Q6H PRN Leandrew Koyanagi, MD   500 mg at 10/24/15 2112   Or  . methocarbamol (ROBAXIN) 500 mg in dextrose 5 % 50 mL IVPB  500 mg Intravenous Q6H PRN Naiping Ephriam Jenkins, MD      . metoCLOPramide (REGLAN) tablet 5-10 mg  5-10 mg Oral Q8H PRN Naiping Ephriam Jenkins, MD       Or  . metoCLOPramide (REGLAN) injection 5-10 mg  5-10 mg Intravenous Q8H PRN Naiping Ephriam Jenkins, MD      . morphine 2 MG/ML injection 1 mg  1 mg Intravenous Q2H PRN Naiping Ephriam Jenkins, MD      . ondansetron (ZOFRAN) tablet 4 mg  4 mg Oral Q6H PRN Naiping Ephriam Jenkins, MD       Or  . ondansetron (ZOFRAN) injection 4 mg  4 mg Intravenous Q6H PRN Naiping Ephriam Jenkins, MD      . oxyCODONE (Oxy IR/ROXICODONE) immediate release tablet 5-15 mg  5-15 mg Oral Q3H PRN Leandrew Koyanagi, MD   10 mg at 10/25/15 [id number]  . oxyCODONE (OXYCONTIN) 12 hr tablet 10 mg  10 mg Oral Q12H Naiping Ephriam Jenkins, MD   10 mg at 10/25/15 1026  . pantoprazole (PROTONIX) EC tablet 20 mg  20 mg Oral Daily Naiping Ephriam Jenkins, MD   20 mg at 10/25/15 1027  . polyethylene glycol (MIRALAX / GLYCOLAX) packet  17 g  17 g Oral Daily PRN  Leandrew Koyanagi, MD      . simvastatin (ZOCOR) tablet 40 mg  40 mg Oral q1800 Leandrew Koyanagi, MD   40 mg at 10/24/15 1710  . sorbitol 70 % solution 30 mL  30 mL Oral Daily PRN Leandrew Koyanagi, MD      . spironolactone (ALDACTONE) tablet 25 mg  25 mg Oral QODAY Leandrew Koyanagi, MD   25 mg at 10/25/15 1026     Discharge Medications: Please see discharge summary for a list of discharge medications.  Relevant Imaging Results:  Relevant Lab Results:   Additional Information SSN: [id number]  Dulcy Fanny, LCSW

## 2015-10-25 NOTE — Progress Notes (Signed)
Physical Therapy Treatment Patient Details Name: Beth Shepard MRN: [id number] DOB: Oct 24, 1955 Today's Date: 10/25/2015    History of Present Illness 60 y.o. female admitted to [facility] on 10/24/15 for elective R direct anterior THA.  Pt with significant PMHx of COPD, DM, CHF, RA, and L knee arthroscopy.    PT Comments    Pt performed increased activity and gait distance during intervention.  Pt required cues for sequencing and postural awareness to correct gait deviations.  Pt inconsistent with assist levels during gait training and would remain to benefit from ST SNF stay to improve strength, balance and mobility before returning home.    Follow Up Recommendations  SNF     Equipment Recommendations  Rolling walker with 5" wheels;3in1 (PT)    Recommendations for Other Services       Precautions / Restrictions Precautions Precautions: Fall Precaution Comments: pt is a bit unsteady on her feet, mild posterior tipping Restrictions Weight Bearing Restrictions: Yes RLE Weight Bearing: Weight bearing as tolerated    Mobility  Bed Mobility               General bed mobility comments: Pt received in recliner   Transfers Overall transfer level: Needs assistance Equipment used: Rolling walker (2 wheeled) Transfers: Sit to/from Stand Sit to Stand: Min guard Stand pivot transfers: Min guard       General transfer comment: Cues for hand placement and forward weight shifting to improve balance and decrease posterior lean.    Ambulation/Gait Ambulation/Gait assistance: Min guard;Supervision (as gt progressed pt required S. ) Ambulation Distance (Feet): 120 Feet Assistive device: Rolling walker (2 wheeled) Gait Pattern/deviations: Step-to pattern;Step-through pattern;Antalgic;Trunk flexed   Gait velocity interpretation: Below normal speed for age/gender General Gait Details: Pt with moderately antalgic gait pattern.  Verbal cues for LE sequencing and encouragement.  Cues for  progression of step to pattern to step  through pattern.  pt required cues for pacing and forward gaze to improve postural awareness.     Stairs            Wheelchair Mobility    Modified Rankin (Stroke Patients Only)       Balance     Sitting balance-Leahy Scale: Fair       Standing balance-Leahy Scale: Good                      Cognition Arousal/Alertness: Awake/alert Behavior During Therapy: WFL for tasks assessed/performed Overall Cognitive Status: Within Functional Limits for tasks assessed                      Exercises Total Joint Exercises Ankle Circles/Pumps: AROM;Both;10 reps;Supine Quad Sets: AROM;Both;10 reps;Supine Gluteal Sets: AROM;Both;10 reps;Supine Heel Slides: AROM;Right;10 reps;Supine Hip ABduction/ADduction: Right;10 reps;Supine;AAROM Straight Leg Raises: AAROM;Right;10 reps;Supine    General Comments        Pertinent Vitals/Pain Pain Assessment: Faces Faces Pain Scale: Hurts little more Pain Location: R hip  Pain Descriptors / Indicators: Guarding;Grimacing Pain Intervention(s): Repositioned;Monitored during session;Ice applied    Home Living                      Prior Function            PT Goals (current goals can now be found in the care plan section) Acute Rehab PT Goals Patient Stated Goal: to go to rehab before home Potential to Achieve Goals: Good Progress towards PT goals: Progressing toward goals    Frequency  7X/week    PT Plan      Co-evaluation             End of Session Equipment Utilized During Treatment: Gait belt Activity Tolerance: Patient limited by pain Patient left: in chair;with call bell/phone within reach     Time: 0837-0913 PT Time Calculation (min) (ACUTE ONLY): 36 min  Charges:  $Gait Training: 8-22 mins $Therapeutic Exercise: 8-22 mins                    G Codes:      Cristela Blue 20-Nov-2015, 9:20 AM  Governor Rooks, PTA pager [phone]

## 2015-10-26 LAB — TYPE AND SCREEN
ABO/RH(D): O NEG
ANTIBODY SCREEN: NEGATIVE
Unit division: 0

## 2015-10-26 LAB — GLUCOSE, CAPILLARY
GLUCOSE-CAPILLARY: 124 mg/dL — AB (ref 65–99)
GLUCOSE-CAPILLARY: 146 mg/dL — AB (ref 65–99)

## 2015-10-26 LAB — CBC
HCT: 29.3 % — ABNORMAL LOW (ref 36.0–46.0)
Hemoglobin: 9.5 g/dL — ABNORMAL LOW (ref 12.0–15.0)
MCH: 28.4 pg (ref 26.0–34.0)
MCHC: 32.4 g/dL (ref 30.0–36.0)
MCV: 87.5 fL (ref 78.0–100.0)
PLATELETS: 284 10*3/uL (ref 150–400)
RBC: 3.35 MIL/uL — AB (ref 3.87–5.11)
RDW: 13.8 % (ref 11.5–15.5)
WBC: 12.6 10*3/uL — ABNORMAL HIGH (ref 4.0–10.5)

## 2015-10-26 LAB — CBC AND DIFFERENTIAL: WBC: 12.6 10^3/mL

## 2015-10-26 NOTE — Progress Notes (Signed)
   Subjective:  Patient reports pain as mild.  Objective:   VITALS:   Filed Vitals:   10/25/15 1250 10/25/15 1413 10/25/15 1900 10/26/15 0520  BP:  108/55 114/59 120/78  Pulse: 73  78 79  Temp: 98.5 F (36.9 C)  99 F (37.2 C) 99.1 F (37.3 C)  TempSrc: Oral  Oral Oral  Resp: 16  16 16   Weight:      SpO2: 93%  93% 93%    Neurologically intact Neurovascular intact Sensation intact distally Intact pulses distally Dorsiflexion/Plantar flexion intact Incision: dressing C/D/I and no drainage No cellulitis present Compartment soft   Lab Results  Component Value Date   WBC 12.6* 10/26/2015   HGB 9.5* 10/26/2015   HCT 29.3* 10/26/2015   MCV 87.5 10/26/2015   PLT 284 10/26/2015     Assessment/Plan:  2 Days Post-Op   - Hgb stable - up with PT - awaiting bed at [facility] - dc when bed available  Marianna Payment 10/26/2015, 7:30 AM [phone]

## 2015-10-26 NOTE — Discharge Summary (Signed)
Physician Discharge Summary      Patient ID: Beth Shepard MRN: [id number] DOB/AGE: 1955/07/11 60 y.o.  Admit date: 10/24/2015 Discharge date: 10/26/2015  Admission Diagnoses:  <principal problem not specified>  Discharge Diagnoses:  Active Problems:   Osteoarthritis of right hip   Hip joint replacement status   Past Medical History  Diagnosis Date  . COPD (chronic obstructive pulmonary disease) ([place])   . Diabetes mellitus     type 2  . GERD (gastroesophageal reflux disease)     chronic  . Hyperlipidemia   . Hypertension   . Anxiety   . Shingles   . Congestive heart failure ([place])   . Gastric polyps   . LBBB (left bundle branch block)     Dr. Gwenith Spitz, [facility]  . Pneumonia     hx of  . Bilateral edema of lower extremity     Swelling in legs and ankles  . IBS (irritable bowel syndrome)   . Rheumatoid arthritis ([place])   . PONV (postoperative nausea and vomiting)     Had N/V after having tubal ligation and D&C    Surgeries: Procedure(s): RIGHT TOTAL HIP ARTHROPLASTY ANTERIOR APPROACH on 10/24/2015   Consultants (if any):    Discharged Condition: Improved  Hospital Course: Beth Shepard is an 60 y.o. female who was admitted 10/24/2015 with a diagnosis of <principal problem not specified> and went to the operating room on 10/24/2015 and underwent the above named procedures.    She was given perioperative antibiotics:  Anti-infectives    Start     Dose/Rate Route Frequency Ordered Stop   10/24/15 1330  ceFAZolin (ANCEF) IVPB 2g/100 mL premix     2 g 200 mL/hr over 30 Minutes Intravenous Every 6 hours 10/24/15 1156 10/24/15 1921   10/24/15 0715  ceFAZolin (ANCEF) IVPB 2g/100 mL premix     2 g 200 mL/hr over 30 Minutes Intravenous To Short Stay 10/23/15 1446 10/24/15 0735    .  She was given sequential compression devices, early ambulation, and lovenox for DVT prophylaxis.  She benefited maximally from the hospital stay and there were no  complications.    Recent vital signs:  Filed Vitals:   10/25/15 1900 10/26/15 0520  BP: 114/59 120/78  Pulse: 78 79  Temp: 99 F (37.2 C) 99.1 F (37.3 C)  Resp: 16 16    Recent laboratory studies:  Lab Results  Component Value Date   HGB 9.5* 10/26/2015   HGB 9.8* 10/25/2015   HGB 7.9* 10/25/2015   Lab Results  Component Value Date   WBC 12.6* 10/26/2015   PLT 284 10/26/2015   Lab Results  Component Value Date   INR 0.99 10/14/2015   Lab Results  Component Value Date   NA 139 10/25/2015   K 3.7 10/25/2015   CL 109 10/25/2015   CO2 22 10/25/2015   BUN 13 10/25/2015   CREATININE 1.22* 10/25/2015   GLUCOSE 125* 10/25/2015    Discharge Medications:     Medication List    TAKE these medications        ALPRAZolam 0.25 MG tablet  Commonly known as:  XANAX  TAKE 1 AND 1/2 TABLETS BY MOUTH EVERY MORNING     aspirin 81 MG tablet  Take 81 mg by mouth daily.     carvedilol 12.5 MG tablet  Commonly known as:  COREG  Take 1 tablet by mouth 2 (two) times daily.     enoxaparin 40 MG/0.4ML injection  Commonly known as:  LOVENOX  Inject 0.4 mLs (40 mg total) into the skin daily.     fenofibrate 160 MG tablet  Take 1 tablet (160 mg total) by mouth daily.     furosemide 20 MG tablet  Commonly known as:  LASIX  Take 1 tablet (20 mg total) by mouth daily. Dr Ola Spurr     glimepiride 2 MG tablet  Commonly known as:  AMARYL  TAKE 1 TABLET (2 MG TOTAL) BY MOUTH DAILY BEFORE BREAKFAST.     HYDROcodone-acetaminophen 5-325 MG tablet  Commonly known as:  NORCO/VICODIN  Take 1-2 tablets by mouth every 4 (four) hours as needed for moderate pain.     Hyoscyamine Sulfate 0.375 MG Tbcr  Take 1 tablet (0.375 mg total) by mouth 2 (two) times daily.     lisinopril 20 MG tablet  Commonly known as:  PRINIVIL,ZESTRIL  TAKE 1 TABLET (20 MG TOTAL) BY MOUTH DAILY.     methocarbamol 750 MG tablet  Commonly known as:  ROBAXIN  Take 1 tablet (750 mg total) by mouth 2 (two)  times daily as needed for muscle spasms.     ondansetron 4 MG tablet  Commonly known as:  ZOFRAN  Take 1-2 tablets (4-8 mg total) by mouth every 8 (eight) hours as needed for nausea or vomiting.     ONE TOUCH TEST STRIPS test strip  Generic drug:  glucose blood  1 each by Other route as needed. Use as instructed     oxyCODONE 5 MG immediate release tablet  Commonly known as:  Oxy IR/ROXICODONE  Take 1-3 tablets (5-15 mg total) by mouth every 4 (four) hours as needed.     oxyCODONE 10 mg 12 hr tablet  Commonly known as:  OXYCONTIN  Take 1 tablet (10 mg total) by mouth every 12 (twelve) hours.     pantoprazole 20 MG tablet  Commonly known as:  PROTONIX  TAKE 1 TABLET (20 MG TOTAL) BY MOUTH DAILY.     senna-docusate 8.6-50 MG tablet  Commonly known as:  SENOKOT S  Take 1 tablet by mouth at bedtime as needed.     simvastatin 40 MG tablet  Commonly known as:  ZOCOR  TAKE 1 TABLET BY MOUTH IN THE EVENING-     spironolactone 25 MG tablet  Commonly known as:  ALDACTONE  Take 1 tablet (25 mg total) by mouth every other day. Dr Ola Spurr     traMADol 50 MG tablet  Commonly known as:  ULTRAM  TAKE 1-2 TABLETS EVERY 6 HOURS AS NEEDED        Diagnostic Studies: Dg Pelvis Portable  10/24/2015  CLINICAL DATA:  Right hip replacement. EXAM: PORTABLE PELVIS 1-2 VIEWS COMPARISON:  03/13/2007 FINDINGS: Interval right hip replacement. Normal AP alignment. No hardware or bony complicating feature. Moderate to advanced degenerative changes in the left hip. IMPRESSION: Right hip replacement.  No complicating feature. Electronically Signed   By: Rolm Baptise M.D.   On: 10/24/2015 10:05   Dg Hip Operative Unilat W Or W/o Pelvis Right  10/24/2015  CLINICAL DATA:  Right hip surgery. EXAM: OPERATIVE RIGHT HIP (WITH PELVIS IF PERFORMED) 2 VIEWS TECHNIQUE: Fluoroscopic spot image(s) were submitted for interpretation post-operatively. COMPARISON:  No recent prior. FINDINGS: Total right hip replacement.  Good anatomic alignment. Hardware intact. IMPRESSION: No acute or focal abnormality. Electronically Signed   By: [place]   On: 10/24/2015 09:30    Disposition: Final discharge disposition not confirmed      Discharge Instructions  Call MD / Call 911    Complete by:  As directed   If you experience chest pain or shortness of breath, CALL 911 and be transported to the hospital emergency room.  If you develope a fever above 101.5 F, pus (white drainage) or increased drainage or redness at the wound, or calf pain, call your surgeon's office.     Constipation Prevention    Complete by:  As directed   Drink plenty of fluids.  Prune juice may be helpful.  You may use a stool softener, such as Colace (over the counter) 100 mg twice a day.  Use MiraLax (over the counter) for constipation as needed.     Diet - low sodium heart healthy    Complete by:  As directed      Diet general    Complete by:  As directed      Driving restrictions    Complete by:  As directed   No driving while taking narcotic pain meds.     Increase activity slowly as tolerated    Complete by:  As directed            Follow-up Information    Follow up with Marianna Payment, MD In 2 weeks.   Specialty:  Orthopedic Surgery   Why:  For suture removal, For wound re-check   Contact information:   [address] [place] [address] [phone]        Signed: Marianna Payment 10/26/2015, 7:27 AM

## 2015-10-26 NOTE — Progress Notes (Signed)
Physical Therapy Treatment Patient Details Name: Labreeska Finberg MRN: [id number] DOB: 1955/08/17 Today's Date: 10/26/2015    History of Present Illness 60 y.o. female admitted to [facility] on 10/24/15 for elective R direct anterior THA.  Pt with significant PMHx of COPD, DM, CHF, RA, and L knee arthroscopy.    PT Comments    Pt continues with altered gait pattern with Trendelenberg pattern and cues for safe posture to decreased fall risk.  Con't to recommend SNF as pt lives alone and has limited support.  Follow Up Recommendations  SNF     Equipment Recommendations  Rolling walker with 5" wheels;3in1 (PT)    Recommendations for Other Services       Precautions / Restrictions Precautions Precautions: Fall Restrictions Weight Bearing Restrictions: Yes RLE Weight Bearing: Weight bearing as tolerated    Mobility  Bed Mobility               General bed mobility comments: Pt up in chair upon arrival  Transfers Overall transfer level: Needs assistance Equipment used: Rolling walker (2 wheeled) Transfers: Sit to/from Stand Sit to Stand: Min guard         General transfer comment: cues for safe hand placement to improve technique  Ambulation/Gait Ambulation/Gait assistance: Min guard Ambulation Distance (Feet): 150 Feet (x2) Assistive device: Rolling walker (2 wheeled) Gait Pattern/deviations: Decreased stride length;Trendelenburg;Step-through pattern;Trunk flexed   Gait velocity interpretation: Below normal speed for age/gender General Gait Details: Pt with limp with Trendelenberg pattern.  Requiring cues for posture and head upright for improved safety,   Stairs            Wheelchair Mobility    Modified Rankin (Stroke Patients Only)       Balance     Sitting balance-Leahy Scale: Good     Standing balance support: During functional activity Standing balance-Leahy Scale: Fair                      Cognition Arousal/Alertness:  Awake/alert Behavior During Therapy: WFL for tasks assessed/performed Overall Cognitive Status: Within Functional Limits for tasks assessed                      Exercises Total Joint Exercises Ankle Circles/Pumps: AROM;Both;10 reps;Supine Quad Sets: AROM;Right;10 reps;Supine Heel Slides: AROM;Right;10 reps;Supine Hip ABduction/ADduction: AROM;Right;Supine;10 reps    General Comments        Pertinent Vitals/Pain Pain Assessment: 0-10 Pain Score: 8  Pain Location: R lower leg, ankle to knee Pain Descriptors / Indicators: Sore Pain Intervention(s): Monitored during session;Limited activity within patient's tolerance    Home Living                      Prior Function            PT Goals (current goals can now be found in the care plan section) Acute Rehab PT Goals Patient Stated Goal: to go to rehab before home PT Goal Formulation: With patient Time For Goal Achievement: 10/31/15 Potential to Achieve Goals: Good Progress towards PT goals: Progressing toward goals    Frequency  7X/week    PT Plan Current plan remains appropriate    Co-evaluation             End of Session Equipment Utilized During Treatment: Gait belt Activity Tolerance: Patient limited by pain Patient left: in chair;with call bell/phone within reach     Time: 1002-1035 PT Time Calculation (min) (ACUTE ONLY): 33 min  Charges:  $Gait Training: 8-22 mins $Therapeutic Exercise: 8-22 mins                    G Codes:      Siriyah Ambrosius LUBECK 10/26/2015, 11:44 AM

## 2015-10-26 NOTE — Clinical Social Work Note (Addendum)
Admissions paperwork to be completed at 2:30PM w/ Rollene Fare from [facility] and [place].   Clinical Social Worker facilitated patient discharge including contacting patient family (patient has notified dtr) and facility to confirm patient discharge plans.  Clinical information faxed to facility and family agreeable with plan.  CSW arranged ambulance transport via [facility] to [facility].  RN to call report prior to discharge.  Clinical Social Worker will sign off for now as social work intervention is no longer needed. Please consult [place] again if new need arises.  Glendon Axe, MSW, LCSWA [phone] 10/26/2015 10:45 AM

## 2015-10-26 NOTE — Clinical Social Work Placement (Signed)
   CLINICAL SOCIAL WORK PLACEMENT  NOTE  Date:  10/26/2015  Patient Details  Name: Beth Shepard MRN: [id number] Date of Birth: [birth year]  Clinical Social Work is seeking post-discharge placement for this patient at the [place] level of care (*CSW will initial, date and re-position this form in  chart as items are completed):  Yes   Patient/family provided with [facility] Work Department's list of facilities offering this level of care within the geographic area requested by the patient (or if unable, by the patient's family).  Yes   Patient/family informed of their freedom to choose among providers that offer the needed level of care, that participate in Medicare, Medicaid or managed care program needed by the patient, have an available bed and are willing to accept the patient.  Yes   Patient/family informed of [facility]'s ownership interest in [facility] and [facility], as well as of the fact that they are under no obligation to receive care at these facilities.  PASRR submitted to EDS on 10/25/15     PASRR number received on 10/25/15     Existing PASRR number confirmed on       FL2 transmitted to all facilities in geographic area requested by pt/family on 10/25/15     FL2 transmitted to all facilities within larger geographic area on       Patient informed that his/her managed care company has contracts with or will negotiate with certain facilities, including the following:        Yes   Patient/family informed of bed offers received.  Patient chooses bed at  [facility] and [place] )     Physician recommends and patient chooses bed at      Patient to be transferred to  [facility] [place] ) on 10/26/15.  Patient to be transferred to facility by  [facility] )     Patient family notified on 10/26/15 of transfer.  Name of family member notified:   (patient alert and oriented x4 and has no family she would like for  CSW to contact at time of discharge)     PHYSICIAN Please sign FL2, Please prepare priority discharge summary, including medications     Additional Comment:    _______________________________________________ Rozell Searing, LCSW 10/26/2015, 10:44 AM

## 2015-10-26 NOTE — Clinical Social Work Note (Signed)
CSW attempted to inform patient that [facility] has a bed available, CSW attempted to inform her 4 times, but she would not wake up.  CSW contacted [facility] and informed them that patient had told unit CSW that she was requesting [facility].  [facility] said they can take patient once she is ready for discharge.  Jones Broom. [place], MSW, [place]

## 2015-10-27 ENCOUNTER — Encounter: Payer: Self-pay | Admitting: Adult Health

## 2015-10-27 ENCOUNTER — Non-Acute Institutional Stay (SKILLED_NURSING_FACILITY): Payer: 59 | Admitting: Adult Health

## 2015-10-27 DIAGNOSIS — E785 Hyperlipidemia, unspecified: Secondary | ICD-10-CM

## 2015-10-27 DIAGNOSIS — F419 Anxiety disorder, unspecified: Secondary | ICD-10-CM | POA: Diagnosis not present

## 2015-10-27 DIAGNOSIS — I509 Heart failure, unspecified: Secondary | ICD-10-CM

## 2015-10-27 DIAGNOSIS — R2681 Unsteadiness on feet: Secondary | ICD-10-CM

## 2015-10-27 DIAGNOSIS — I10 Essential (primary) hypertension: Secondary | ICD-10-CM

## 2015-10-27 DIAGNOSIS — K219 Gastro-esophageal reflux disease without esophagitis: Secondary | ICD-10-CM | POA: Diagnosis not present

## 2015-10-27 DIAGNOSIS — M1611 Unilateral primary osteoarthritis, right hip: Secondary | ICD-10-CM

## 2015-10-27 DIAGNOSIS — K589 Irritable bowel syndrome without diarrhea: Secondary | ICD-10-CM

## 2015-10-27 DIAGNOSIS — D72829 Elevated white blood cell count, unspecified: Secondary | ICD-10-CM

## 2015-10-27 DIAGNOSIS — E119 Type 2 diabetes mellitus without complications: Secondary | ICD-10-CM

## 2015-10-27 DIAGNOSIS — D62 Acute posthemorrhagic anemia: Secondary | ICD-10-CM

## 2015-10-27 NOTE — Progress Notes (Addendum)
Patient ID: Beth Shepard, female   DOB: Nov 28, 1955, 60 y.o.   MRN: [id number]    DATE:  10/27/2015   MRN:  [id number]  BIRTHDAY: 07-14-1955  Facility:  Nursing Home Location:  [place] Room Number: 702-P  LEVEL OF CARE:  SNF (31)  Contact Information    Name Relation Home Work Mobile   Selph,[name] Daughter [phone]     [name] [phone]     Bohlman,[name] Daughter [phone]  [phone]       Code Status History    This patient does not have a recorded code status. Please follow your organizational policy for patients in this situation.       Chief Complaint  Patient presents with  . Hospitalization Follow-up    HISTORY OF PRESENT ILLNESS:  This is a 60 year old female who has been admitted to [facility] on 10/26/15 from [facility]. She has PMH of COPD, type 2 DM, GERD, hyperlipidemia, hypertension, anxiety LBBB, IBS, CHF and RA. She has osteoarthritis of right hip for which she had right total hip arthroplasty anterior approach on 10/24/15.  She has been admitted for a short-term rehabilitation.  PAST MEDICAL HISTORY:  Past Medical History  Diagnosis Date  . COPD (chronic obstructive pulmonary disease) ([place])   . Diabetes mellitus     type 2  . GERD (gastroesophageal reflux disease)     chronic  . Hyperlipidemia   . Hypertension   . Anxiety   . Shingles   . Congestive heart failure ([place])   . Gastric polyps   . LBBB (left bundle branch block)     Dr. Gwenith Spitz, [facility]  . Pneumonia     hx of  . Bilateral edema of lower extremity     Swelling in legs and ankles  . IBS (irritable bowel syndrome)   . Rheumatoid arthritis ([place])   . PONV (postoperative nausea and vomiting)     Had N/V after having tubal ligation and D&C     CURRENT MEDICATIONS: Reviewed  Patient's Medications  New Prescriptions   No medications on file  Previous Medications   ALPRAZOLAM (XANAX) 0.25 MG TABLET     TAKE 1 AND 1/2 TABLETS BY MOUTH EVERY MORNING   ASPIRIN 81 MG TABLET    Take 81 mg by mouth daily.   CARVEDILOL (COREG) 12.5 MG TABLET    Take 1 tablet by mouth 2 (two) times daily.   DOCUSATE SODIUM (COLACE) 100 MG CAPSULE    Take 100 mg by mouth 2 (two) times daily.   ENOXAPARIN (LOVENOX) 40 MG/0.4ML INJECTION    Inject 0.4 mLs (40 mg total) into the skin daily.   FENOFIBRATE 160 MG TABLET    Take 1 tablet (160 mg total) by mouth daily.   FUROSEMIDE (LASIX) 20 MG TABLET    Take 1 tablet (20 mg total) by mouth daily. Dr Ola Spurr   GLIMEPIRIDE (AMARYL) 2 MG TABLET    TAKE 1 TABLET (2 MG TOTAL) BY MOUTH DAILY BEFORE BREAKFAST.   GLUCOSE BLOOD (ONE TOUCH TEST STRIPS) TEST STRIP    1 each by Other route as needed. Reported on 10/27/2015   HYDROCODONE-ACETAMINOPHEN (NORCO/VICODIN) 5-325 MG TABLET    Take 1-2 tablets by mouth every 4 (four) hours as needed for moderate pain.   HYOSCYAMINE SULFATE 0.375 MG TBCR    Take 1 tablet (0.375 mg total) by mouth 2 (two) times daily.   LISINOPRIL (PRINIVIL,ZESTRIL) 20 MG TABLET  TAKE 1 TABLET (20 MG TOTAL) BY MOUTH DAILY.   METHOCARBAMOL (ROBAXIN) 750 MG TABLET    Take 1 tablet (750 mg total) by mouth 2 (two) times daily as needed for muscle spasms.   ONDANSETRON (ZOFRAN) 4 MG TABLET    Take 1-2 tablets (4-8 mg total) by mouth every 8 (eight) hours as needed for nausea or vomiting.   OXYCODONE (OXY IR/ROXICODONE) 5 MG IMMEDIATE RELEASE TABLET    Take 1-3 tablets (5-15 mg total) by mouth every 4 (four) hours as needed.   OXYCODONE (OXYCONTIN) 10 MG 12 HR TABLET    Take 1 tablet (10 mg total) by mouth every 12 (twelve) hours.   PANTOPRAZOLE (PROTONIX) 20 MG TABLET    TAKE 1 TABLET (20 MG TOTAL) BY MOUTH DAILY.   POLYETHYLENE GLYCOL (MIRALAX / GLYCOLAX) PACKET    Take 17 g by mouth daily as needed.   SENNA-DOCUSATE (SENOKOT S) 8.6-50 MG TABLET    Take 1 tablet by mouth at bedtime as needed.   SIMVASTATIN (ZOCOR) 40 MG TABLET    TAKE 1 TABLET BY MOUTH IN THE  EVENING-   SPIRONOLACTONE (ALDACTONE) 25 MG TABLET    Take 1 tablet (25 mg total) by mouth every other day. Dr Ola Spurr   TRAMADOL (ULTRAM) 50 MG TABLET    TAKE 1-2 TABLETS EVERY 6 HOURS AS NEEDED  Modified Medications   No medications on file  Discontinued Medications   No medications on file     Allergies  Allergen Reactions  . Prednisone Palpitations and Other (See Comments)    Dose pack causes vomiting; Causes Gerd  . Codeine     REACTION: vomiting  . Ibuprofen Other (See Comments)    REACTION: questionable. Pt states that she can take this sometimes.     REVIEW OF SYSTEMS:  GENERAL: no change in appetite, no fatigue, no weight changes, no fever, chills or weakness EYES: Denies change in vision, dry eyes, eye pain, itching or discharge EARS: Denies change in hearing, ringing in ears, or earache NOSE: Denies nasal congestion or epistaxis MOUTH and THROAT: Denies oral discomfort, gingival pain or bleeding, pain from teeth or hoarseness   RESPIRATORY: no cough, SOB, DOE, wheezing, hemoptysis CARDIAC: no chest pain, edema or palpitations GI: no abdominal pain, diarrhea, constipation, heart burn, nausea or vomiting GU: Denies dysuria, frequency, hematuria, incontinence, or discharge PSYCHIATRIC: Denies feeling of depression or anxiety. No report of hallucinations, insomnia, paranoia, or agitation    PHYSICAL EXAMINATION  GENERAL APPEARANCE: Well nourished. In no acute distress. Obese SKIN:  Right hip surgical incision is covered with aquacel dressing, dry, has slight erythema HEAD: Normal in size and contour. No evidence of trauma EYES: Lids open and close normally. No blepharitis, entropion or ectropion. PERRL. Conjunctivae are clear and sclerae are white. Lenses are without opacity EARS: Pinnae are normal. Patient hears normal voice tunes of the examiner MOUTH and THROAT: Lips are without lesions. Oral mucosa is moist and without lesions. Tongue is normal in shape, size,  and color and without lesions NECK: supple, trachea midline, no neck masses, no thyroid tenderness, no thyromegaly LYMPHATICS: no LAN in the neck, no supraclavicular LAN RESPIRATORY: breathing is even & unlabored, BS CTAB CARDIAC: RRR, no murmur,no extra heart sounds, BLE edema trace GI: abdomen soft, normal BS, no masses, no tenderness, no hepatomegaly, no splenomegaly EXTREMITIES:  Able to move X 4 extremities PSYCHIATRIC: Alert and oriented X 3. Affect and behavior are appropriate  LABS/RADIOLOGY: Labs reviewed: Basic Metabolic Panel:  Recent Labs  08/25/15 1555 10/14/15 1045 10/24/15 1242 10/25/15 0412  NA 138 138  --  139  K 4.0 4.1  --  3.7  CL 103 104  --  109  CO2 27 25  --  22  GLUCOSE 63* 150*  --  125*  BUN 21 19  --  13  CREATININE 1.19 1.38* 0.98 1.22*  CALCIUM 9.8 9.4  --  8.6*   Liver Function Tests:  Recent Labs  08/25/15 1555 10/14/15 1045  AST 24 19  ALT 27 13*  ALKPHOS 85 67  BILITOT 0.4 0.3  PROT 7.5 7.0  ALBUMIN 4.4 3.7   CBC:  Recent Labs  08/25/15 1555 10/14/15 1045 10/24/15 1242 10/25/15 0412 10/25/15 1337 10/26/15 0311  WBC 11.1* 8.4 14.5* 7.5  --  12.6*  NEUTROABS 8.3* 5.7  --   --   --   --   HGB 10.8* 10.7* 9.2* 7.9* 9.8* 9.5*  HCT 32.5* 34.4* 29.1* 25.5* 30.7* 29.3*  MCV 84.3 88.0 89.8 87.9  --  87.5  PLT 346.0 256 296 239  --  284   A1C: Invalid input(s): A1C Lipid Panel:  Recent Labs  08/25/15 1555  HDL 46.30   Cardiac Enzymes:  Recent Labs  10/24/15 1037  TROPONINI <0.03   CBG:  Recent Labs  10/25/15 2147 10/26/15 0614 10/26/15 1115  GLUCAP 160* 124* 146*      Dg Pelvis Portable  10/24/2015  CLINICAL DATA:  Right hip replacement. EXAM: PORTABLE PELVIS 1-2 VIEWS COMPARISON:  03/13/2007 FINDINGS: Interval right hip replacement. Normal AP alignment. No hardware or bony complicating feature. Moderate to advanced degenerative changes in the left hip. IMPRESSION: Right hip replacement.  No complicating  feature. Electronically Signed   By: Rolm Baptise M.D.   On: 10/24/2015 10:05   Dg Hip Operative Unilat W Or W/o Pelvis Right  10/24/2015  CLINICAL DATA:  Right hip surgery. EXAM: OPERATIVE RIGHT HIP (WITH PELVIS IF PERFORMED) 2 VIEWS TECHNIQUE: Fluoroscopic spot image(s) were submitted for interpretation post-operatively. COMPARISON:  No recent prior. FINDINGS: Total right hip replacement. Good anatomic alignment. Hardware intact. IMPRESSION: No acute or focal abnormality. Electronically Signed   By: [place]   On: 10/24/2015 09:30    ASSESSMENT/PLAN:  Unsteady gait - for rehabilitation   Osteoarthritis S/P Right total hop arthroplasty - for rehabilitation; continue Lovenox 40 mg SQ daily for DVT prophylaxis; Norco 5/325 mg 1-2 tab PO Q 4 hours PRN, Oxycodone 5 MG IR 1-3 tabs PO Q 4 hours PRN Oxycontin 10 mg 12 hr 1 tab Q 12 hours for pain; she has requested Tramadol to be discontinued since it is not effective for her; Robaxin 750 mg 1 tab PO BID PRN; follow-up with Dr. Erlinda Hong, orthopedic surgeon, in 2 weeks  Hypertension - continue Coreg 12.5 mg BID, Lasix 20 mg daily and Lisinopril 20 mg 1 tab daily  Diabetes mellitus, type 2 - hgbA1c 6.4; continue Amaryl 2 mg 1 tab daily  Hyperlipidemia - continue Fenofibrate 160 mg 1 tab daily and Zocor 40 mg 1 tab Po Q evening  GERD - continue Protonix 20 mg 1 tab daily  CHF - continue Lasix 20 mg 1 tab daily and Spironolactone 25 mg 1 tab PO Q other day; check BMP; weigh Q Mondays and Wednesdays  Leukocytosis - wbc 12.6; re-check CBC  Anxiety - mood is stable; continue Alprazolam 0.25 mg tale 1 1/2 tab = 0.375 mg Q AM  IBS - continue Hyoscyamine sulfate 0.375  mg 1 tab BID  Anemia, acute blood loss - hgb 9.5; re-check CBC      Goals of care:  Short-term rehabilitation    [facility], NP [place]

## 2015-10-28 ENCOUNTER — Non-Acute Institutional Stay (SKILLED_NURSING_FACILITY): Payer: 59 | Admitting: Internal Medicine

## 2015-10-28 ENCOUNTER — Encounter: Payer: Self-pay | Admitting: Internal Medicine

## 2015-10-28 ENCOUNTER — Other Ambulatory Visit: Payer: Self-pay | Admitting: Emergency Medicine

## 2015-10-28 ENCOUNTER — Telehealth: Payer: Self-pay | Admitting: Family Medicine

## 2015-10-28 DIAGNOSIS — K5901 Slow transit constipation: Secondary | ICD-10-CM | POA: Diagnosis not present

## 2015-10-28 DIAGNOSIS — E119 Type 2 diabetes mellitus without complications: Secondary | ICD-10-CM | POA: Diagnosis not present

## 2015-10-28 DIAGNOSIS — I509 Heart failure, unspecified: Secondary | ICD-10-CM

## 2015-10-28 DIAGNOSIS — I10 Essential (primary) hypertension: Secondary | ICD-10-CM | POA: Diagnosis not present

## 2015-10-28 DIAGNOSIS — E785 Hyperlipidemia, unspecified: Secondary | ICD-10-CM

## 2015-10-28 DIAGNOSIS — K219 Gastro-esophageal reflux disease without esophagitis: Secondary | ICD-10-CM

## 2015-10-28 DIAGNOSIS — D62 Acute posthemorrhagic anemia: Secondary | ICD-10-CM

## 2015-10-28 DIAGNOSIS — R1310 Dysphagia, unspecified: Secondary | ICD-10-CM

## 2015-10-28 DIAGNOSIS — D72829 Elevated white blood cell count, unspecified: Secondary | ICD-10-CM

## 2015-10-28 DIAGNOSIS — K589 Irritable bowel syndrome without diarrhea: Secondary | ICD-10-CM

## 2015-10-28 DIAGNOSIS — N289 Disorder of kidney and ureter, unspecified: Secondary | ICD-10-CM

## 2015-10-28 DIAGNOSIS — R682 Dry mouth, unspecified: Secondary | ICD-10-CM

## 2015-10-28 DIAGNOSIS — M1611 Unilateral primary osteoarthritis, right hip: Secondary | ICD-10-CM

## 2015-10-28 DIAGNOSIS — R2681 Unsteadiness on feet: Secondary | ICD-10-CM | POA: Diagnosis not present

## 2015-10-28 LAB — BASIC METABOLIC PANEL
BUN: 22 mg/dL — AB (ref 4–21)
CREATININE: 1.2 mg/dL — AB (ref 0.5–1.1)
GLUCOSE: 97 mg/dL
POTASSIUM: 3.6 mmol/L (ref 3.4–5.3)
SODIUM: 142 mmol/L (ref 137–147)

## 2015-10-28 LAB — CBC AND DIFFERENTIAL
HCT: 28 % — AB (ref 36–46)
Hemoglobin: 8.7 g/dL — AB (ref 12.0–16.0)
Neutrophils Absolute: 5 /uL
Platelets: 298 10*3/uL (ref 150–399)
WBC: 8 10^3/mL

## 2015-10-28 MED ORDER — FENOFIBRATE 160 MG PO TABS: 160.0000 mg | ORAL_TABLET | Freq: Every day | ORAL | Status: DC

## 2015-10-28 NOTE — Progress Notes (Signed)
LOCATION:  [facility]  PCP: Ann Held, DO   Code Status: Full Code  Goals of care: Advanced Directive information Advanced Directives 10/14/2015  Does patient have an advance directive? No  Would patient like information on creating an advanced directive? No - patient declined information       Extended Emergency Contact Information Primary Emergency Contact: [place],[name] Address: [address]          [place], [place] [address] [place] Phone: [phone] Relation: Daughter Secondary Emergency Contact: Michelene Gardener [place] Phone: [phone] Relation: Sister   Allergies  Allergen Reactions  . Prednisone Palpitations and Other (See Comments)    Dose pack causes vomiting; Causes Gerd  . Codeine     REACTION: vomiting  . Ibuprofen Other (See Comments)    REACTION: questionable. Pt states that she can take this sometimes.    Chief Complaint  Patient presents with  . New Admit To SNF    New Admission     HPI:  Patient is a 60 y.o. female seen today for short term rehabilitation post hospital admission from 10/24/15-10/26/15 with right hip OA. She underwent right total hip arthroplasty on 10/24/15. She is seen in her room today.   Review of Systems:  Constitutional: Negative for chills, diaphoresis. Positive for fever of 100.2 on Tuesday. Feels weak and tired. no further temperature spike.  HENT: Negative for headache, congestion, nasal discharge, sore throat. Positive for difficulty swallowing and dry mouth.   Eyes: Negative for blurred vision, double vision and discharge.  Respiratory: Negative for shortness of breath and wheezing. Positive for chronic cough.   Cardiovascular: Negative for chest pain, palpitations, leg swelling.  Gastrointestinal: Negative for heartburn, nausea, vomiting, abdominal pain, loss of appetite, melena, diarrhea. Last bowel movement was Sunday. Has history of IBS and has irregular  bowel  movement Genitourinary: Negative for dysuria and flank pain.  Musculoskeletal: Negative for back pain, fall in the facility.  Skin: Negative for itching, rash.  Neurological: Negative for dizziness. Psychiatric/Behavioral: Negative for depression.    Past Medical History  Diagnosis Date  . COPD (chronic obstructive pulmonary disease) ([place])   . Diabetes mellitus     type 2  . GERD (gastroesophageal reflux disease)     chronic  . Hyperlipidemia   . Hypertension   . Anxiety   . Shingles   . Congestive heart failure ([place])   . Gastric polyps   . LBBB (left bundle branch block)     Dr. Gwenith Spitz, [facility]  . Pneumonia     hx of  . Bilateral edema of lower extremity     Swelling in legs and ankles  . IBS (irritable bowel syndrome)   . Rheumatoid arthritis ([place])   . PONV (postoperative nausea and vomiting)     Had N/V after having tubal ligation and D&C   Past Surgical History  Procedure Laterality Date  . Vaginal hysterectomy    . Cholecystectomy    . Knee arthroscopy Left   . Colonoscopy  06/06/2005    normal   . Esophagogastroduodenoscopy  06/06/2005, 11/17/2013  . Cardiac catheterization      No PCI  . Tubal ligation    . Dilation and curettage of uterus    . Total hip arthroplasty Right 10/24/2015    Procedure: RIGHT TOTAL HIP ARTHROPLASTY ANTERIOR APPROACH;  Surgeon: Leandrew Koyanagi, MD;  Location: [facility];  Service: Orthopedics;  Laterality: Right;  Social History:   reports that she quit smoking about 13 years ago. Her smoking use included Cigarettes. She quit after 33 years of use. She has never used smokeless tobacco. She reports that she drinks alcohol. She reports that she does not use illicit drugs.  Family History  Problem Relation Age of Onset  . Lymphoma Mother   . Heart disease Mother     chf, cabg x6  . Hypertension Mother   . GER disease Mother   . Hiatal hernia Mother   . Gallstones Mother   . Cancer Sister     brain stage 4  .  Heart disease Maternal Grandmother 53    MI  . Heart disease Maternal Grandfather   . Stroke Paternal Grandmother   . Hypertension Paternal Grandmother   . Stroke Paternal Grandfather   . Hypertension Paternal Grandfather   . Rheum arthritis Father   . Colon cancer Neg Hx   . Esophageal cancer Neg Hx   . Pancreatic cancer Neg Hx   . Liver disease Neg Hx   . Kidney disease Neg Hx   . Hypertension Daughter     Medications:   Medication List       This list is accurate as of: 10/28/15 12:40 PM.  Always use your most recent med list.               ALPRAZolam 0.25 MG tablet  Commonly known as:  XANAX  TAKE 1 AND 1/2 TABLETS BY MOUTH EVERY MORNING     aspirin 81 MG tablet  Take 81 mg by mouth daily.     carvedilol 12.5 MG tablet  Commonly known as:  COREG  Take 1 tablet by mouth 2 (two) times daily.     docusate sodium 100 MG capsule  Commonly known as:  COLACE  Take 100 mg by mouth 2 (two) times daily.     enoxaparin 40 MG/0.4ML injection  Commonly known as:  LOVENOX  Inject 0.4 mLs (40 mg total) into the skin daily.     fenofibrate 160 MG tablet  Take 1 tablet (160 mg total) by mouth daily.     furosemide 20 MG tablet  Commonly known as:  LASIX  Take 1 tablet (20 mg total) by mouth daily. Dr Ola Spurr     glimepiride 2 MG tablet  Commonly known as:  AMARYL  TAKE 1 TABLET (2 MG TOTAL) BY MOUTH DAILY BEFORE BREAKFAST.     HYDROcodone-acetaminophen 5-325 MG tablet  Commonly known as:  NORCO/VICODIN  Take 1-2 tablets by mouth every 4 (four) hours as needed for moderate pain.     Hyoscyamine Sulfate 0.375 MG Tbcr  Take 1 tablet (0.375 mg total) by mouth 2 (two) times daily.     lisinopril 20 MG tablet  Commonly known as:  PRINIVIL,ZESTRIL  TAKE 1 TABLET (20 MG TOTAL) BY MOUTH DAILY.     methocarbamol 750 MG tablet  Commonly known as:  ROBAXIN  Take 1 tablet (750 mg total) by mouth 2 (two) times daily as needed for muscle spasms.     ondansetron 4 MG  tablet  Commonly known as:  ZOFRAN  Take 1-2 tablets (4-8 mg total) by mouth every 8 (eight) hours as needed for nausea or vomiting.     ONE TOUCH TEST STRIPS test strip  Generic drug:  glucose blood  1 each by Other route as needed. Reported on 10/27/2015     oxyCODONE 5 MG immediate release tablet  Commonly known as:  Oxy IR/ROXICODONE  Take 1-3 tablets (5-15 mg total) by mouth every 4 (four) hours as needed.     oxyCODONE 10 mg 12 hr tablet  Commonly known as:  OXYCONTIN  Take 1 tablet (10 mg total) by mouth every 12 (twelve) hours.     pantoprazole 20 MG tablet  Commonly known as:  PROTONIX  TAKE 1 TABLET (20 MG TOTAL) BY MOUTH DAILY.     polyethylene glycol packet  Commonly known as:  MIRALAX / GLYCOLAX  Take 17 g by mouth daily as needed.     senna-docusate 8.6-50 MG tablet  Commonly known as:  SENOKOT S  Take 1 tablet by mouth at bedtime as needed.     simvastatin 40 MG tablet  Commonly known as:  ZOCOR  TAKE 1 TABLET BY MOUTH IN THE EVENING-     spironolactone 25 MG tablet  Commonly known as:  ALDACTONE  Take 1 tablet (25 mg total) by mouth every other day. Dr Ola Spurr     traMADol 50 MG tablet  Commonly known as:  ULTRAM  TAKE 1-2 TABLETS EVERY 6 HOURS AS NEEDED        Immunizations: Immunization History  Administered Date(s) Administered  . Influenza Whole 03/17/2009  . Influenza,inj,Quad PF,36+ Mos 09/07/2014  . PPD Test 10/26/2015  . Pneumococcal Polysaccharide-23 11/13/2012  . Tdap 11/13/2012     Physical Exam: Filed Vitals:   10/28/15 1204  BP: 94/64  Pulse: 78  Temp: 99.6 F (37.6 C)  TempSrc: Oral  Resp: 18  Height: 5\' 2"  (1.575 m)  Weight: 194 lb (87.998 kg)  SpO2: 96%   Body mass index is 35.47 kg/(m^2).  BP Readings from Last 3 Encounters:  10/28/15 94/64  10/27/15 108/88  10/26/15 120/78   General- adult female, obese, in no acute distress Head- normocephalic, atraumatic Nose- no nasal discharge Throat- dry mucus  membrane Eyes- PERRLA, EOMI, no pallor, no icterus, no discharge, normal conjunctiva, normal sclera Neck- no cervical lymphadenopathy Cardiovascular- normal s1,s2, no murmur, trace leg edema, ted hose + Respiratory- bilateral clear to auscultation, no wheeze, no rhonchi, no crackles, no use of accessory muscles Abdomen- bowel sounds present, soft, non tender Musculoskeletal- able to move all 4 extremities, limited right hip ROM Neurological- alert and oriented to person, place and time Skin- warm and dry, right hip surgical incision with aquacel dressing in place Psychiatry- normal mood and affect    Labs reviewed: Basic Metabolic Panel:  Recent Labs  08/25/15 1555 10/14/15 1045 10/24/15 1242 10/25/15 10/25/15 0412  NA 138 138  --  139 139  K 4.0 4.1  --   --  3.7  CL 103 104  --   --  109  CO2 27 25  --   --  22  GLUCOSE 63* 150*  --   --  125*  BUN 21 19  --  13 13  CREATININE 1.19 1.38* 0.98 1.2* 1.22*  CALCIUM 9.8 9.4  --   --  8.6*   Liver Function Tests:  Recent Labs  08/25/15 1555 10/14/15 1045  AST 24 19  ALT 27 13*  ALKPHOS 85 67  BILITOT 0.4 0.3  PROT 7.5 7.0  ALBUMIN 4.4 3.7   No results for input(s): LIPASE, AMYLASE in the last 8760 hours. No results for input(s): AMMONIA in the last 8760 hours. CBC:  Recent Labs  08/25/15 1555 10/14/15 1045 10/24/15 1242 10/25/15 0412 10/25/15 1337 10/26/15 10/26/15 0311  WBC 11.1* 8.4 14.5* 7.5  --  12.6 12.6*  NEUTROABS 8.3* 5.7  --   --   --   --   --   HGB 10.8* 10.7* 9.2* 7.9* 9.8*  --  9.5*  HCT 32.5* 34.4* 29.1* 25.5* 30.7*  --  29.3*  MCV 84.3 88.0 89.8 87.9  --   --  87.5  PLT 346.0 256 296 239  --   --  284   Cardiac Enzymes:  Recent Labs  10/24/15 1037  TROPONINI <0.03   BNP: Invalid input(s): POCBNP CBG:  Recent Labs  10/25/15 2147 10/26/15 0614 10/26/15 1115  GLUCAP 160* 124* 146*    Radiological Exams: Dg Pelvis Portable  10/24/2015  CLINICAL DATA:  Right hip replacement.  EXAM: PORTABLE PELVIS 1-2 VIEWS COMPARISON:  03/13/2007 FINDINGS: Interval right hip replacement. Normal AP alignment. No hardware or bony complicating feature. Moderate to advanced degenerative changes in the left hip. IMPRESSION: Right hip replacement.  No complicating feature. Electronically Signed   By: Rolm Baptise M.D.   On: 10/24/2015 10:05   Dg Hip Operative Unilat W Or W/o Pelvis Right  10/24/2015  CLINICAL DATA:  Right hip surgery. EXAM: OPERATIVE RIGHT HIP (WITH PELVIS IF PERFORMED) 2 VIEWS TECHNIQUE: Fluoroscopic spot image(s) were submitted for interpretation post-operatively. COMPARISON:  No recent prior. FINDINGS: Total right hip replacement. Good anatomic alignment. Hardware intact. IMPRESSION: No acute or focal abnormality. Electronically Signed   By: Marcello Moores  Register   On: 10/24/2015 09:30    Assessment/Plan  Unsteady gait Post surgical repair of right hip, Will have patient work with PT/OT as tolerated to regain strength and restore function.  Fall precautions are in place.  Right hip OA S/p right total hip arthroplasty. Continue oxycodone 10 mg bid with norco 5-325 mg 1-2 tab q4h prn pain and monitor. Has f/u with orthopedics. Continue robaxin 500 mg bid prn for muscle spasm. Continue lovenox for dvt prophylaxis. Will have her work with physical therapy and occupational therapy team to help with gait training and muscle strengthening exercises.fall precautions. Skin care. Encourage to be out of bed. Get PMR consult for pain management. Also on oxyIR prn, discontinue this for now  Dry mouth Add biotene mouth wash and monitor  Dysphagia Get SLP consult, small bites and sips of water in between encouraged for now  Blood loss anemia Post op, monitor cbc  Constipation Add colace 100 mg bid and miralax daily as needed, hydration to be maintained  Hypertension Low BP reading today. Currently on lasix 20 mg daily, loisinopril 20 mg daily and coreg 12.5 mg bid. Monitor bp bid and  add holding parameters  Leukocytosis Monitor cbc with diff.   DM type 2 Monitor cbg. Continue amary; 2 mg daily. a1c reviewed  gerd Symptom controlled. Continue protonix 20 mg daily  CHF Monitor weight. Continue coreg, lisinopril and lasix, check bmp and weight  Hyperlipidemia Continue zocor for now  IBS  continue Hyoscyamine bid  Impaired renal function Monitor bmp   Goals of care: short term rehabilitation   Labs/tests ordered: cbc, cmp  Family/ staff Communication: reviewed care plan with patient and nursing supervisor    Blanchie Serve, MD Internal Medicine [place], [place] [address] Cell Phone (Monday-Friday 8 am - 5 pm): [phone] On Call: [phone] and follow prompts after 5 pm and on weekends Office Phone: [phone] Office Fax: [phone]

## 2015-10-28 NOTE — Telephone Encounter (Signed)
Caller name: Deann with [facility] Can be reached: [phone]   Reason for call: Please send new RX for 90 day supply of fenofibrate - insurance will not cover 30 day supply.

## 2015-11-02 ENCOUNTER — Non-Acute Institutional Stay (SKILLED_NURSING_FACILITY): Payer: 59 | Admitting: Adult Health

## 2015-11-02 ENCOUNTER — Encounter: Payer: Self-pay | Admitting: Adult Health

## 2015-11-02 DIAGNOSIS — F419 Anxiety disorder, unspecified: Secondary | ICD-10-CM | POA: Diagnosis not present

## 2015-11-02 DIAGNOSIS — I509 Heart failure, unspecified: Secondary | ICD-10-CM

## 2015-11-02 DIAGNOSIS — K219 Gastro-esophageal reflux disease without esophagitis: Secondary | ICD-10-CM

## 2015-11-02 DIAGNOSIS — R2681 Unsteadiness on feet: Secondary | ICD-10-CM

## 2015-11-02 DIAGNOSIS — E119 Type 2 diabetes mellitus without complications: Secondary | ICD-10-CM | POA: Diagnosis not present

## 2015-11-02 DIAGNOSIS — M1611 Unilateral primary osteoarthritis, right hip: Secondary | ICD-10-CM

## 2015-11-02 DIAGNOSIS — K5901 Slow transit constipation: Secondary | ICD-10-CM | POA: Diagnosis not present

## 2015-11-02 DIAGNOSIS — I10 Essential (primary) hypertension: Secondary | ICD-10-CM

## 2015-11-02 DIAGNOSIS — E785 Hyperlipidemia, unspecified: Secondary | ICD-10-CM | POA: Diagnosis not present

## 2015-11-02 DIAGNOSIS — K589 Irritable bowel syndrome without diarrhea: Secondary | ICD-10-CM

## 2015-11-02 DIAGNOSIS — D62 Acute posthemorrhagic anemia: Secondary | ICD-10-CM | POA: Diagnosis not present

## 2015-11-02 NOTE — Progress Notes (Signed)
Patient ID: Beth Shepard, female   DOB: [birth year], 60 y.o.   MRN: [id number]    DATE:  11/02/2015   MRN:  [id number]  BIRTHDAY: [birth year]  Facility:  [facility] Location:  [place] [facility] [place] Room Number: 702-P  LEVEL OF CARE:  SNF (31)  Contact Information    Name Relation Home Work Mobile   Beth Shepard Daughter [phone]     Beth Shepard [phone]     Beth Shepard Daughter [phone]  [phone]       Code Status History    This patient does not have a recorded code status. Please follow your organizational policy for patients in this situation.       Chief Complaint  Patient presents with  . Discharge Note    HISTORY OF PRESENT ILLNESS:  This is a 60 year old female who Is for discharge home with home health PT, OT, CNA and Nursing. DME:  Bedside commode and rolling walker.  She has been admitted to [facility] on 10/26/15 from [facility]. She has PMH of COPD, type 2 DM, GERD, hyperlipidemia, hypertension, anxiety LBBB, IBS, CHF and RA. She has osteoarthritis of right hip for which she had right total hip arthroplasty anterior approach on 10/24/15.    Patient was admitted to this facility for short-term rehabilitation after the patient's recent hospitalization.  Patient has completed SNF rehabilitation and therapy has cleared the patient for discharge.   PAST MEDICAL HISTORY:  Past Medical History  Diagnosis Date  . COPD (chronic obstructive pulmonary disease) ([place])   . Diabetes mellitus     type 2  . GERD (gastroesophageal reflux disease)     chronic  . Hyperlipidemia   . Hypertension   . Anxiety   . Shingles   . Congestive heart failure ([place])   . Gastric polyps   . LBBB (left bundle branch block)     Dr. Gwenith Spitz, [facility]  . Pneumonia     hx of  . Bilateral edema of lower extremity     Swelling in legs and ankles  . IBS (irritable bowel syndrome)   . Rheumatoid arthritis ([place])   .  PONV (postoperative nausea and vomiting)     Had N/V after having tubal ligation and D&C     CURRENT MEDICATIONS: Reviewed  Patient's Medications  New Prescriptions   No medications on file  Previous Medications   ALPRAZOLAM (XANAX) 0.25 MG TABLET    TAKE 1 AND 1/2 TABLETS BY MOUTH EVERY MORNING   ANTISEPTIC ORAL RINSE (BIOTENE) LIQD    15 mLs by Mouth Rinse route every 4 (four) hours as needed for dry mouth.   ASPIRIN 81 MG TABLET    Take 81 mg by mouth daily.   CARVEDILOL (COREG) 12.5 MG TABLET    Take 1 tablet by mouth 2 (two) times daily.   DOCUSATE SODIUM (COLACE) 100 MG CAPSULE    Take 100 mg by mouth 2 (two) times daily.   ENOXAPARIN (LOVENOX) 40 MG/0.4ML INJECTION    Inject 0.4 mLs (40 mg total) into the skin daily.   FUROSEMIDE (LASIX) 20 MG TABLET    Take 1 tablet (20 mg total) by mouth daily. Dr Ola Spurr   GLIMEPIRIDE (AMARYL) 2 MG TABLET    TAKE 1 TABLET (2 MG TOTAL) BY MOUTH DAILY BEFORE BREAKFAST.   GLUCOSE BLOOD (ONE TOUCH TEST STRIPS) TEST STRIP    1 each by Other route as needed. Reported on 11/02/2015   HYDROCODONE-ACETAMINOPHEN (NORCO/VICODIN) 5-325  MG TABLET    Take 1-2 tablets by mouth every 4 (four) hours as needed for moderate pain.   HYOSCYAMINE SULFATE 0.375 MG TBCR    Take 1 tablet (0.375 mg total) by mouth 2 (two) times daily.   LISINOPRIL (PRINIVIL,ZESTRIL) 20 MG TABLET    TAKE 1 TABLET (20 MG TOTAL) BY MOUTH DAILY.   METHOCARBAMOL (ROBAXIN) 750 MG TABLET    Take 1 tablet (750 mg total) by mouth 2 (two) times daily as needed for muscle spasms.   ONDANSETRON (ZOFRAN) 4 MG TABLET    Take 1-2 tablets (4-8 mg total) by mouth every 8 (eight) hours as needed for nausea or vomiting.   OXYCODONE (OXYCONTIN) 10 MG 12 HR TABLET    Take 1 tablet (10 mg total) by mouth every 12 (twelve) hours.   PANTOPRAZOLE (PROTONIX) 20 MG TABLET    TAKE 1 TABLET (20 MG TOTAL) BY MOUTH DAILY.   POLYETHYLENE GLYCOL (MIRALAX / GLYCOLAX) PACKET    Take 17 g by mouth daily as needed.    SENNA-DOCUSATE (SENOKOT S) 8.6-50 MG TABLET    Take 1 tablet by mouth at bedtime as needed.   SIMVASTATIN (ZOCOR) 40 MG TABLET    TAKE 1 TABLET BY MOUTH IN THE EVENING-   SPIRONOLACTONE (ALDACTONE) 25 MG TABLET    Take 1 tablet (25 mg total) by mouth every other day. Dr Ola Spurr  Modified Medications   No medications on file  Discontinued Medications   FENOFIBRATE 160 MG TABLET    Take 1 tablet (160 mg total) by mouth daily.   OXYCODONE (OXY IR/ROXICODONE) 5 MG IMMEDIATE RELEASE TABLET    Take 1-3 tablets (5-15 mg total) by mouth every 4 (four) hours as needed.   TRAMADOL (ULTRAM) 50 MG TABLET    TAKE 1-2 TABLETS EVERY 6 HOURS AS NEEDED     Allergies  Allergen Reactions  . Prednisone Palpitations and Other (See Comments)    Dose pack causes vomiting; Causes Gerd  . Codeine     REACTION: vomiting  . Ibuprofen Other (See Comments)    REACTION: questionable. Pt states that she can take this sometimes.     REVIEW OF SYSTEMS:  GENERAL: no change in appetite, no fatigue, no weight changes, no fever, chills or weakness EYES: Denies change in vision, dry eyes, eye pain, itching or discharge EARS: Denies change in hearing, ringing in ears, or earache NOSE: Denies nasal congestion or epistaxis MOUTH and THROAT: Denies oral discomfort, gingival pain or bleeding, pain from teeth or hoarseness   RESPIRATORY: no cough, SOB, DOE, wheezing, hemoptysis CARDIAC: no chest pain, edema or palpitations GI: no abdominal pain, diarrhea, constipation, heart burn, nausea or vomiting GU: Denies dysuria, frequency, hematuria, incontinence, or discharge PSYCHIATRIC: Denies feeling of depression or anxiety. No report of hallucinations, insomnia, paranoia, or agitation    PHYSICAL EXAMINATION  GENERAL APPEARANCE: Well nourished. In no acute distress. Obese SKIN:  Right hip surgical incision is covered with aquacel dressing, dry, no erythema HEAD: Normal in size and contour. No evidence of trauma EYES:  Lids open and close normally. No blepharitis, entropion or ectropion. PERRL. Conjunctivae are clear and sclerae are white. Lenses are without opacity EARS: Pinnae are normal. Patient hears normal voice tunes of the examiner MOUTH and THROAT: Lips are without lesions. Oral mucosa is moist and without lesions. Tongue is normal in shape, size, and color and without lesions NECK: supple, trachea midline, no neck masses, no thyroid tenderness, no thyromegaly LYMPHATICS: no LAN in the  neck, no supraclavicular LAN RESPIRATORY: breathing is even & unlabored, BS CTAB CARDIAC: RRR, no murmur,no extra heart sounds, BLE edema trace GI: abdomen soft, normal BS, no masses, no tenderness, no hepatomegaly, no splenomegaly EXTREMITIES:  Able to move X 4 extremities PSYCHIATRIC: Alert and oriented X 3. Affect and behavior are appropriate  LABS/RADIOLOGY: Labs reviewed: Basic Metabolic Panel:  Recent Labs  08/25/15 1555 10/14/15 1045  10/25/15 10/25/15 0412 10/28/15  NA 138 138  --  139 139 142  K 4.0 4.1  --   --  3.7 3.6  CL 103 104  --   --  109  --   CO2 27 25  --   --  22  --   GLUCOSE 63* 150*  --   --  125*  --   BUN 21 19  --  13 13 22*  CREATININE 1.19 1.38*  < > 1.2* 1.22* 1.2*  CALCIUM 9.8 9.4  --   --  8.6*  --   < > = values in this interval not displayed. Liver Function Tests:  Recent Labs  08/25/15 1555 10/14/15 1045  AST 24 19  ALT 27 13*  ALKPHOS 85 67  BILITOT 0.4 0.3  PROT 7.5 7.0  ALBUMIN 4.4 3.7   CBC:  Recent Labs  08/25/15 1555 10/14/15 1045 10/24/15 1242 10/25/15 0412 10/25/15 1337 10/26/15 10/26/15 0311 10/28/15  WBC 11.1* 8.4 14.5* 7.5  --  12.6 12.6* 8.0  NEUTROABS 8.3* 5.7  --   --   --   --   --  5  HGB 10.8* 10.7* 9.2* 7.9* 9.8*  --  9.5* 8.7*  HCT 32.5* 34.4* 29.1* 25.5* 30.7*  --  29.3* 28*  MCV 84.3 88.0 89.8 87.9  --   --  87.5  --   PLT 346.0 256 296 239  --   --  284 298   A1C: Invalid input(s): A1C Lipid Panel:  Recent Labs   08/25/15 1555  HDL 46.30   Cardiac Enzymes:  Recent Labs  10/24/15 1037  TROPONINI <0.03   CBG:  Recent Labs  10/25/15 2147 10/26/15 0614 10/26/15 1115  GLUCAP 160* 124* 146*      Dg Pelvis Portable  10/24/2015  CLINICAL DATA:  Right hip replacement. EXAM: PORTABLE PELVIS 1-2 VIEWS COMPARISON:  03/13/2007 FINDINGS: Interval right hip replacement. Normal AP alignment. No hardware or bony complicating feature. Moderate to advanced degenerative changes in the left hip. IMPRESSION: Right hip replacement.  No complicating feature. Electronically Signed   By: Rolm Baptise M.D.   On: 10/24/2015 10:05   Dg Hip Operative Unilat W Or W/o Pelvis Right  10/24/2015  CLINICAL DATA:  Right hip surgery. EXAM: OPERATIVE RIGHT HIP (WITH PELVIS IF PERFORMED) 2 VIEWS TECHNIQUE: Fluoroscopic spot image(s) were submitted for interpretation post-operatively. COMPARISON:  No recent prior. FINDINGS: Total right hip replacement. Good anatomic alignment. Hardware intact. IMPRESSION: No acute or focal abnormality. Electronically Signed   By: Marcello Moores  Register   On: 10/24/2015 09:30    ASSESSMENT/PLAN:  Unsteady gait - for Home health PT, OT, CNA and Nursing  Osteoarthritis S/P Right total hop arthroplasty - for rehabilitation; continue Lovenox 40 mg SQ daily for DVT prophylaxis; Norco 5/325 mg 1-2 tab PO Q 4 hours PRN, Oxycontin 10 mg 12 hr 1 tab Q 12 hours for pain;  Robaxin 750 mg 1 tab PO BID PRN for muscle spasm; follow-up with Dr. Erlinda Hong, orthopedic surgeon  Hypertension - continue Coreg 12.5 mg BID, Lasix  20 mg daily and Lisinopril 20 mg 1 tab daily  Diabetes mellitus, type 2 - hgbA1c 6.4; continue Amaryl 2 mg 1 tab daily  Hyperlipidemia - continue Fenofibrate 160 mg 1 tab daily and Zocor 40 mg 1 tab Po Q evening Lab Results  Component Value Date   CHOL 213* 08/25/2015   HDL 46.30 08/25/2015   LDLCALC 64 11/13/2012   LDLDIRECT 114.0 08/25/2015   TRIG 229.0* 08/25/2015   CHOLHDL 5 08/25/2015     GERD - continue Protonix 20 mg 1 tab daily  CHF - continue Lasix 20 mg 1 tab daily and Spironolactone 25 mg 1 tab PO Q other day; check BMP; weigh Q Mondays and Wednesdays  Leukocytosis - wbc 12.6; re-check wbc 8.0, resolved  Anxiety - mood is stable; continue Alprazolam 0.25 mg tale 1 1/2 tab = 0.375 mg Q AM  IBS - continue Hyoscyamine sulfate 0.375 mg 1 tab BID  Anemia, acute blood loss - hgb 9.5; re-check hgb 8.7   Constipation - continue Colace 100 mg 1 capsule by mouth twice a day and MiraLAX 17 g by mouth daily when necessary    I have filled out patient's discharge paperwork and written prescriptions.  Patient will receive home health PT, OT, Nursing and CNA.  DME provided:  Rolling walker and bedside commode  Total discharge time: Greater than 30 minutes  Discharge time involved coordination of the discharge process with Education officer, museum, nursing staff and therapy department. Medical justification for home health services/DME verified.   Durenda Age, NP [employer] [phone]

## 2015-11-08 ENCOUNTER — Telehealth: Payer: Self-pay | Admitting: Family Medicine

## 2015-11-08 NOTE — Telephone Encounter (Signed)
Left message for return call from [place] with [facility].

## 2015-11-08 NOTE — Telephone Encounter (Signed)
Caller name: Olin Hauser with [facility] Can be reached: [phone] Fax#: [phone]   Reason for call: Pam opened pt to [facility] Saturday 11/05/15. Pt came out of a rehab facility after hip replacement. She is requesting VO for SN. She also said pt needs some education on meds. Pt is on a low dose aspirin. Pt leg is swollen and she was advised to contact PCP.

## 2015-11-08 NOTE — Telephone Encounter (Signed)
Verbal order given for Skilled Nursing. Nurse would like to know if patient needs medication other than Aspirin 81 mg. States patient recently had Hip surgery and has swelling from incision site down leg. Wants to know if she should be on stronger blood thinner.

## 2015-11-08 NOTE — Telephone Encounter (Signed)
Ok to give verbal order.

## 2015-11-08 NOTE — Telephone Encounter (Signed)
They should talk to the surgeon

## 2015-11-09 NOTE — Telephone Encounter (Signed)
Called nurse with [facility]) advised she needed to speak with surgeon per Dr. Etter Sjogren.

## 2015-11-16 ENCOUNTER — Other Ambulatory Visit: Payer: Self-pay | Admitting: Family Medicine

## 2015-11-16 NOTE — Telephone Encounter (Signed)
Last OV: 08/25/15 Last filled: 08/22/15, #90, 0 RF Sig: TAKE 1 AND 1/2 TABLETS BY MOUTH EVERY MORNING  UDS: 03/03/15, negative for xanax, low risk

## 2015-11-18 ENCOUNTER — Other Ambulatory Visit: Payer: Self-pay | Admitting: Family Medicine

## 2015-11-18 NOTE — Telephone Encounter (Signed)
Last filled: 11/13/12 Last OV:  08/25/15 Called patient to verify need for lasix.  Pt states she does not need a refill on lasix.  She only requested a refill at [facility] for the ALPRAZolam.   Pt made aware that a refill was printed on 11/17/15.  A reminder about upcoming appt was also reviewed with patient.  Pt was appreciative for the call and did not voice any additional questions or concerns.

## 2015-11-25 ENCOUNTER — Telehealth: Payer: Self-pay | Admitting: Family Medicine

## 2015-11-25 NOTE — Telephone Encounter (Signed)
Can be reached: [phone] Pharmacy: [id number] - [place], [place] [address]  Reason for call: Pt had surgery beginning of May for hip replacement. She has been on colace for 2-3 weeks that was recommended by a nurse. She is taking hyoscyamine sulfate 2 times per day. She is asking if this could be causing additional constipation and what she can do. She is having a lot of issues going to the bathroom, mild bleeding with stools, stomach cramping, etc. She is requesting a call. She is hoping not to add any medicines but maybe stop some. Pt states taking pain meds maybe 1 time per day of oxycontin and 1 time per day of methocarbamol.

## 2015-11-25 NOTE — Telephone Encounter (Signed)
miralax otc Drink a lot of water

## 2015-11-25 NOTE — Telephone Encounter (Signed)
Spoke with patient who is having issues with constipations, she is using colace twice a day for a few weeks, but she has been able to make a small BM but she is have some bleeding due to the stool being hard as a brick. She is also taking the Hyoscyamine Sulfate 0.375 mg once daily as well, she wanted to know if you has any other suggestions.    KP

## 2015-11-25 NOTE — Telephone Encounter (Signed)
Patient has been made aware and verbalized understanding, she has agreed to try the Miralax.     KP

## 2015-11-26 ENCOUNTER — Other Ambulatory Visit: Payer: Self-pay | Admitting: Nurse Practitioner

## 2015-11-29 ENCOUNTER — Other Ambulatory Visit: Payer: Self-pay | Admitting: Nurse Practitioner

## 2015-12-09 ENCOUNTER — Other Ambulatory Visit: Payer: Self-pay | Admitting: Nurse Practitioner

## 2015-12-21 ENCOUNTER — Telehealth: Payer: Self-pay | Admitting: *Deleted

## 2015-12-21 NOTE — Telephone Encounter (Signed)
Forwarded to Dr. Lowne-Chase. JG//CMA 

## 2015-12-22 DIAGNOSIS — Z96641 Presence of right artificial hip joint: Secondary | ICD-10-CM | POA: Diagnosis not present

## 2015-12-22 DIAGNOSIS — E119 Type 2 diabetes mellitus without complications: Secondary | ICD-10-CM | POA: Diagnosis not present

## 2015-12-24 ENCOUNTER — Other Ambulatory Visit: Payer: Self-pay | Admitting: Nurse Practitioner

## 2015-12-28 NOTE — Telephone Encounter (Signed)
Signed forms faxed to [facility] successfully. Sent for scanning. JG//CMA

## 2016-01-13 ENCOUNTER — Encounter: Payer: Self-pay | Admitting: Family Medicine

## 2016-01-13 ENCOUNTER — Ambulatory Visit (INDEPENDENT_AMBULATORY_CARE_PROVIDER_SITE_OTHER): Payer: 59 | Admitting: Family Medicine

## 2016-01-13 VITALS — BP 100/60 | HR 90 | Temp 98.0°F | Ht 62.0 in | Wt 194.2 lb

## 2016-01-13 DIAGNOSIS — M069 Rheumatoid arthritis, unspecified: Secondary | ICD-10-CM | POA: Diagnosis not present

## 2016-01-13 DIAGNOSIS — E1165 Type 2 diabetes mellitus with hyperglycemia: Secondary | ICD-10-CM

## 2016-01-13 DIAGNOSIS — IMO0002 Reserved for concepts with insufficient information to code with codable children: Secondary | ICD-10-CM

## 2016-01-13 DIAGNOSIS — E785 Hyperlipidemia, unspecified: Secondary | ICD-10-CM

## 2016-01-13 DIAGNOSIS — E1151 Type 2 diabetes mellitus with diabetic peripheral angiopathy without gangrene: Secondary | ICD-10-CM | POA: Diagnosis not present

## 2016-01-13 DIAGNOSIS — Z0000 Encounter for general adult medical examination without abnormal findings: Secondary | ICD-10-CM | POA: Diagnosis not present

## 2016-01-13 DIAGNOSIS — I10 Essential (primary) hypertension: Secondary | ICD-10-CM | POA: Diagnosis not present

## 2016-01-13 DIAGNOSIS — R319 Hematuria, unspecified: Secondary | ICD-10-CM

## 2016-01-13 LAB — COMPREHENSIVE METABOLIC PANEL
ALBUMIN: 4.3 g/dL (ref 3.6–5.1)
ALT: 8 U/L (ref 6–29)
AST: 15 U/L (ref 10–35)
Alkaline Phosphatase: 76 U/L (ref 33–130)
BUN: 27 mg/dL — ABNORMAL HIGH (ref 7–25)
CALCIUM: 9.3 mg/dL (ref 8.6–10.4)
CHLORIDE: 103 mmol/L (ref 98–110)
CO2: 22 mmol/L (ref 20–31)
CREATININE: 1.42 mg/dL — AB (ref 0.50–0.99)
Glucose, Bld: 64 mg/dL — ABNORMAL LOW (ref 65–99)
Potassium: 4.4 mmol/L (ref 3.5–5.3)
SODIUM: 138 mmol/L (ref 135–146)
TOTAL PROTEIN: 7.3 g/dL (ref 6.1–8.1)
Total Bilirubin: 0.3 mg/dL (ref 0.2–1.2)

## 2016-01-13 LAB — CBC WITH DIFFERENTIAL/PLATELET
BASOS ABS: 85 {cells}/uL (ref 0–200)
Basophils Relative: 1 %
EOS ABS: 170 {cells}/uL (ref 15–500)
Eosinophils Relative: 2 %
HEMATOCRIT: 33.8 % — AB (ref 35.0–45.0)
HEMOGLOBIN: 11.2 g/dL — AB (ref 11.7–15.5)
LYMPHS ABS: 2295 {cells}/uL (ref 850–3900)
Lymphocytes Relative: 27 %
MCH: 28 pg (ref 27.0–33.0)
MCHC: 33.1 g/dL (ref 32.0–36.0)
MCV: 84.5 fL (ref 80.0–100.0)
MONO ABS: 680 {cells}/uL (ref 200–950)
MPV: 8.6 fL (ref 7.5–12.5)
Monocytes Relative: 8 %
NEUTROS ABS: 5270 {cells}/uL (ref 1500–7800)
Neutrophils Relative %: 62 %
Platelets: 349 10*3/uL (ref 140–400)
RBC: 4 MIL/uL (ref 3.80–5.10)
RDW: 14 % (ref 11.0–15.0)
WBC: 8.5 10*3/uL (ref 3.8–10.8)

## 2016-01-13 LAB — POCT URINALYSIS DIPSTICK
Bilirubin, UA: NEGATIVE
GLUCOSE UA: NEGATIVE
Ketones, UA: NEGATIVE
Leukocytes, UA: NEGATIVE
Nitrite, UA: NEGATIVE
PH UA: 5.5
PROTEIN UA: NEGATIVE
Spec Grav, UA: 1.025
UROBILINOGEN UA: 0.2

## 2016-01-13 LAB — RHEUMATOID FACTOR

## 2016-01-13 LAB — HEMOGLOBIN A1C
HEMOGLOBIN A1C: 6.1 % — AB (ref <5.7)
MEAN PLASMA GLUCOSE: 128 mg/dL

## 2016-01-13 LAB — LIPID PANEL
CHOLESTEROL: 194 mg/dL (ref 125–200)
HDL: 53 mg/dL (ref >=46)
LDL CALC: 108 mg/dL (ref <130)
TRIGLYCERIDES: 167 mg/dL — AB (ref <150)
Total CHOL/HDL Ratio: 3.7 Ratio (ref <=5.0)
VLDL: 33 mg/dL — ABNORMAL HIGH (ref <30)

## 2016-01-13 MED ORDER — SIMVASTATIN 40 MG PO TABS: ORAL_TABLET | ORAL | Status: DC

## 2016-01-13 MED ORDER — PANTOPRAZOLE SODIUM 20 MG PO TBEC: DELAYED_RELEASE_TABLET | ORAL | Status: DC

## 2016-01-13 MED ORDER — CARVEDILOL 12.5 MG PO TABS: 12.5000 mg | ORAL_TABLET | Freq: Two times a day (BID) | ORAL | Status: DC

## 2016-01-13 MED ORDER — GLIMEPIRIDE 2 MG PO TABS: ORAL_TABLET | ORAL | Status: DC

## 2016-01-13 MED ORDER — ALPRAZOLAM 0.25 MG PO TABS: ORAL_TABLET | ORAL | Status: DC

## 2016-01-13 MED ORDER — FUROSEMIDE 20 MG PO TABS: 20.0000 mg | ORAL_TABLET | Freq: Every day | ORAL | Status: DC

## 2016-01-13 NOTE — Patient Instructions (Signed)
Preventive Care for Adults, Female A healthy lifestyle and preventive care can promote health and wellness. Preventive health guidelines for women include the following key practices.  A routine yearly physical is a good way to check with your health care provider about your health and preventive screening. It is a chance to share any concerns and updates on your health and to receive a thorough exam.  Visit your dentist for a routine exam and preventive care every 6 months. Brush your teeth twice a day and floss once a day. Good oral hygiene prevents tooth decay and gum disease.  The frequency of eye exams is based on your age, health, family medical history, use of contact lenses, and other factors. Follow your health care provider's recommendations for frequency of eye exams.  Eat a healthy diet. Foods like vegetables, fruits, whole grains, low-fat dairy products, and lean protein foods contain the nutrients you need without too many calories. Decrease your intake of foods high in solid fats, added sugars, and salt. Eat the right amount of calories for you.Get information about a proper diet from your health care provider, if necessary.  Regular physical exercise is one of the most important things you can do for your health. Most adults should get at least 150 minutes of moderate-intensity exercise (any activity that increases your heart rate and causes you to sweat) each week. In addition, most adults need muscle-strengthening exercises on 2 or more days a week.  Maintain a healthy weight. The body mass index (BMI) is a screening tool to identify possible weight problems. It provides an estimate of body fat based on height and weight. Your health care provider can find your BMI and can help you achieve or maintain a healthy weight.For adults 20 years and older:  A BMI below 18.5 is considered underweight.  A BMI of 18.5 to 24.9 is normal.  A BMI of 25 to 29.9 is considered overweight.  A  BMI of 30 and above is considered obese.  Maintain normal blood lipids and cholesterol levels by exercising and minimizing your intake of saturated fat. Eat a balanced diet with plenty of fruit and vegetables. Blood tests for lipids and cholesterol should begin at age 45 and be repeated every 5 years. If your lipid or cholesterol levels are high, you are over 50, or you are at high risk for heart disease, you may need your cholesterol levels checked more frequently.Ongoing high lipid and cholesterol levels should be treated with medicines if diet and exercise are not working.  If you smoke, find out from your health care provider how to quit. If you do not use tobacco, do not start.  Lung cancer screening is recommended for adults aged 45-80 years who are at high risk for developing lung cancer because of a history of smoking. A yearly low-dose CT scan of the lungs is recommended for people who have at least a 30-pack-year history of smoking and are a current smoker or have quit within the past 15 years. A pack year of smoking is smoking an average of 1 pack of cigarettes a day for 1 year (for example: 1 pack a day for 30 years or 2 packs a day for 15 years). Yearly screening should continue until the smoker has stopped smoking for at least 15 years. Yearly screening should be stopped for people who develop a health problem that would prevent them from having lung cancer treatment.  If you are pregnant, do not drink alcohol. If you are  breastfeeding, be very cautious about drinking alcohol. If you are not pregnant and choose to drink alcohol, do not have more than 1 drink per day. One drink is considered to be 12 ounces (355 mL) of beer, 5 ounces (148 mL) of wine, or 1.5 ounces (44 mL) of liquor.  Avoid use of street drugs. Do not share needles with anyone. Ask for help if you need support or instructions about stopping the use of drugs.  High blood pressure causes heart disease and increases the risk  of stroke. Your blood pressure should be checked at least every 1 to 2 years. Ongoing high blood pressure should be treated with medicines if weight loss and exercise do not work.  If you are 55-79 years old, ask your health care provider if you should take aspirin to prevent strokes.  Diabetes screening is done by taking a blood sample to check your blood glucose level after you have not eaten for a certain period of time (fasting). If you are not overweight and you do not have risk factors for diabetes, you should be screened once every 3 years starting at age 45. If you are overweight or obese and you are 40-70 years of age, you should be screened for diabetes every year as part of your cardiovascular risk assessment.  Breast cancer screening is essential preventive care for women. You should practice "breast self-awareness." This means understanding the normal appearance and feel of your breasts and may include breast self-examination. Any changes detected, no matter how small, should be reported to a health care provider. Women in their 20s and 30s should have a clinical breast exam (CBE) by a health care provider as part of a regular health exam every 1 to 3 years. After age 40, women should have a CBE every year. Starting at age 40, women should consider having a mammogram (breast X-ray test) every year. Women who have a family history of breast cancer should talk to their health care provider about genetic screening. Women at a high risk of breast cancer should talk to their health care providers about having an MRI and a mammogram every year.  Breast cancer gene (BRCA)-related cancer risk assessment is recommended for women who have family members with BRCA-related cancers. BRCA-related cancers include breast, ovarian, tubal, and peritoneal cancers. Having family members with these cancers may be associated with an increased risk for harmful changes (mutations) in the breast cancer genes BRCA1 and  BRCA2. Results of the assessment will determine the need for genetic counseling and BRCA1 and BRCA2 testing.  Your health care provider may recommend that you be screened regularly for cancer of the pelvic organs (ovaries, uterus, and vagina). This screening involves a pelvic examination, including checking for microscopic changes to the surface of your cervix (Pap test). You may be encouraged to have this screening done every 3 years, beginning at age 21.  For women ages 30-65, health care providers may recommend pelvic exams and Pap testing every 3 years, or they may recommend the Pap and pelvic exam, combined with testing for human papilloma virus (HPV), every 5 years. Some types of HPV increase your risk of cervical cancer. Testing for HPV may also be done on women of any age with unclear Pap test results.  Other health care providers may not recommend any screening for nonpregnant women who are considered low risk for pelvic cancer and who do not have symptoms. Ask your health care provider if a screening pelvic exam is right for   you.  If you have had past treatment for cervical cancer or a condition that could lead to cancer, you need Pap tests and screening for cancer for at least 20 years after your treatment. If Pap tests have been discontinued, your risk factors (such as having a new sexual partner) need to be reassessed to determine if screening should resume. Some women have medical problems that increase the chance of getting cervical cancer. In these cases, your health care provider may recommend more frequent screening and Pap tests.  Colorectal cancer can be detected and often prevented. Most routine colorectal cancer screening begins at the age of 50 years and continues through age 75 years. However, your health care provider may recommend screening at an earlier age if you have risk factors for colon cancer. On a yearly basis, your health care provider may provide home test kits to check  for hidden blood in the stool. Use of a small camera at the end of a tube, to directly examine the colon (sigmoidoscopy or colonoscopy), can detect the earliest forms of colorectal cancer. Talk to your health care provider about this at age 50, when routine screening begins. Direct exam of the colon should be repeated every 5-10 years through age 75 years, unless early forms of precancerous polyps or small growths are found.  People who are at an increased risk for hepatitis B should be screened for this virus. You are considered at high risk for hepatitis B if:  You were born in a country where hepatitis B occurs often. Talk with your health care provider about which countries are considered high risk.  Your parents were born in a high-risk country and you have not received a shot to protect against hepatitis B (hepatitis B vaccine).  You have HIV or AIDS.  You use needles to inject street drugs.  You live with, or have sex with, someone who has hepatitis B.  You get hemodialysis treatment.  You take certain medicines for conditions like cancer, organ transplantation, and autoimmune conditions.  Hepatitis C blood testing is recommended for all people born from 1945 through 1965 and any individual with known risks for hepatitis C.  Practice safe sex. Use condoms and avoid high-risk sexual practices to reduce the spread of sexually transmitted infections (STIs). STIs include gonorrhea, chlamydia, syphilis, trichomonas, herpes, HPV, and human immunodeficiency virus (HIV). Herpes, HIV, and HPV are viral illnesses that have no cure. They can result in disability, cancer, and death.  You should be screened for sexually transmitted illnesses (STIs) including gonorrhea and chlamydia if:  You are sexually active and are younger than 24 years.  You are older than 24 years and your health care provider tells you that you are at risk for this type of infection.  Your sexual activity has changed  since you were last screened and you are at an increased risk for chlamydia or gonorrhea. Ask your health care provider if you are at risk.  If you are at risk of being infected with HIV, it is recommended that you take a prescription medicine daily to prevent HIV infection. This is called preexposure prophylaxis (PrEP). You are considered at risk if:  You are sexually active and do not regularly use condoms or know the HIV status of your partner(s).  You take drugs by injection.  You are sexually active with a partner who has HIV.  Talk with your health care provider about whether you are at high risk of being infected with HIV. If   you choose to begin PrEP, you should first be tested for HIV. You should then be tested every 3 months for as long as you are taking PrEP.  Osteoporosis is a disease in which the bones lose minerals and strength with aging. This can result in serious bone fractures or breaks. The risk of osteoporosis can be identified using a bone density scan. Women ages 67 years and over and women at risk for fractures or osteoporosis should discuss screening with their health care providers. Ask your health care provider whether you should take a calcium supplement or vitamin D to reduce the rate of osteoporosis.  Menopause can be associated with physical symptoms and risks. Hormone replacement therapy is available to decrease symptoms and risks. You should talk to your health care provider about whether hormone replacement therapy is right for you.  Use sunscreen. Apply sunscreen liberally and repeatedly throughout the day. You should seek shade when your shadow is shorter than you. Protect yourself by wearing long sleeves, pants, a wide-brimmed hat, and sunglasses year round, whenever you are outdoors.  Once a month, do a whole body skin exam, using a mirror to look at the skin on your back. Tell your health care provider of new moles, moles that have irregular borders, moles that  are larger than a pencil eraser, or moles that have changed in shape or color.  Stay current with required vaccines (immunizations).  Influenza vaccine. All adults should be immunized every year.  Tetanus, diphtheria, and acellular pertussis (Td, Tdap) vaccine. Pregnant women should receive 1 dose of Tdap vaccine during each pregnancy. The dose should be obtained regardless of the length of time since the last dose. Immunization is preferred during the 27th-36th week of gestation. An adult who has not previously received Tdap or who does not know her vaccine status should receive 1 dose of Tdap. This initial dose should be followed by tetanus and diphtheria toxoids (Td) booster doses every 10 years. Adults with an unknown or incomplete history of completing a 3-dose immunization series with Td-containing vaccines should begin or complete a primary immunization series including a Tdap dose. Adults should receive a Td booster every 10 years.  Varicella vaccine. An adult without evidence of immunity to varicella should receive 2 doses or a second dose if she has previously received 1 dose. Pregnant females who do not have evidence of immunity should receive the first dose after pregnancy. This first dose should be obtained before leaving the health care facility. The second dose should be obtained 4-8 weeks after the first dose.  Human papillomavirus (HPV) vaccine. Females aged 13-26 years who have not received the vaccine previously should obtain the 3-dose series. The vaccine is not recommended for use in pregnant females. However, pregnancy testing is not needed before receiving a dose. If a female is found to be pregnant after receiving a dose, no treatment is needed. In that case, the remaining doses should be delayed until after the pregnancy. Immunization is recommended for any person with an immunocompromised condition through the age of 61 years if she did not get any or all doses earlier. During the  3-dose series, the second dose should be obtained 4-8 weeks after the first dose. The third dose should be obtained 24 weeks after the first dose and 16 weeks after the second dose.  Zoster vaccine. One dose is recommended for adults aged 30 years or older unless certain conditions are present.  Measles, mumps, and rubella (MMR) vaccine. Adults born  before 1957 generally are considered immune to measles and mumps. Adults born in 1957 or later should have 1 or more doses of MMR vaccine unless there is a contraindication to the vaccine or there is laboratory evidence of immunity to each of the three diseases. A routine second dose of MMR vaccine should be obtained at least 28 days after the first dose for students attending postsecondary schools, health care workers, or international travelers. People who received inactivated measles vaccine or an unknown type of measles vaccine during 1963-1967 should receive 2 doses of MMR vaccine. People who received inactivated mumps vaccine or an unknown type of mumps vaccine before 1979 and are at high risk for mumps infection should consider immunization with 2 doses of MMR vaccine. For females of childbearing age, rubella immunity should be determined. If there is no evidence of immunity, females who are not pregnant should be vaccinated. If there is no evidence of immunity, females who are pregnant should delay immunization until after pregnancy. Unvaccinated health care workers born before 1957 who lack laboratory evidence of measles, mumps, or rubella immunity or laboratory confirmation of disease should consider measles and mumps immunization with 2 doses of MMR vaccine or rubella immunization with 1 dose of MMR vaccine.  Pneumococcal 13-valent conjugate (PCV13) vaccine. When indicated, a person who is uncertain of his immunization history and has no record of immunization should receive the PCV13 vaccine. All adults 65 years of age and older should receive this  vaccine. An adult aged 19 years or older who has certain medical conditions and has not been previously immunized should receive 1 dose of PCV13 vaccine. This PCV13 should be followed with a dose of pneumococcal polysaccharide (PPSV23) vaccine. Adults who are at high risk for pneumococcal disease should obtain the PPSV23 vaccine at least 8 weeks after the dose of PCV13 vaccine. Adults older than 62 years of age who have normal immune system function should obtain the PPSV23 vaccine dose at least 1 year after the dose of PCV13 vaccine.  Pneumococcal polysaccharide (PPSV23) vaccine. When PCV13 is also indicated, PCV13 should be obtained first. All adults aged 65 years and older should be immunized. An adult younger than age 65 years who has certain medical conditions should be immunized. Any person who resides in a nursing home or long-term care facility should be immunized. An adult smoker should be immunized. People with an immunocompromised condition and certain other conditions should receive both PCV13 and PPSV23 vaccines. People with human immunodeficiency virus (HIV) infection should be immunized as soon as possible after diagnosis. Immunization during chemotherapy or radiation therapy should be avoided. Routine use of PPSV23 vaccine is not recommended for American Indians, [place] Natives, or people younger than 65 years unless there are medical conditions that require PPSV23 vaccine. When indicated, people who have unknown immunization and have no record of immunization should receive PPSV23 vaccine. One-time revaccination 5 years after the first dose of PPSV23 is recommended for people aged 19-64 years who have chronic kidney failure, nephrotic syndrome, asplenia, or immunocompromised conditions. People who received 1-2 doses of PPSV23 before age 65 years should receive another dose of PPSV23 vaccine at age 65 years or later if at least 5 years have passed since the previous dose. Doses of PPSV23 are not  needed for people immunized with PPSV23 at or after age 65 years.  Meningococcal vaccine. Adults with asplenia or persistent complement component deficiencies should receive 2 doses of quadrivalent meningococcal conjugate (MenACWY-D) vaccine. The doses should be obtained   at least 2 months apart. Microbiologists working with certain meningococcal bacteria, [place] recruits, people at risk during an outbreak, and people who travel to or live in countries with a high rate of meningitis should be immunized. A first-year college student up through age 34 years who is living in a residence hall should receive a dose if she did not receive a dose on or after her 16th birthday. Adults who have certain high-risk conditions should receive one or more doses of vaccine.  Hepatitis A vaccine. Adults who wish to be protected from this disease, have certain high-risk conditions, work with hepatitis A-infected animals, work in hepatitis A research labs, or travel to or work in countries with a high rate of hepatitis A should be immunized. Adults who were previously unvaccinated and who anticipate close contact with an international adoptee during the first 60 days after arrival in the [place] from a country with a high rate of hepatitis A should be immunized.  Hepatitis B vaccine. Adults who wish to be protected from this disease, have certain high-risk conditions, may be exposed to blood or other infectious body fluids, are household contacts or sex partners of hepatitis B positive people, are clients or workers in certain care facilities, or travel to or work in countries with a high rate of hepatitis B should be immunized.  Haemophilus influenzae type b (Hib) vaccine. A previously unvaccinated person with asplenia or sickle cell disease or having a scheduled splenectomy should receive 1 dose of Hib vaccine. Regardless of previous immunization, a recipient of a hematopoietic stem cell transplant should receive a  3-dose series 6-12 months after her successful transplant. Hib vaccine is not recommended for adults with HIV infection. Preventive Services / Frequency Ages 35 to 4 years  Blood pressure check.** / Every 3-5 years.  Lipid and cholesterol check.** / Every 5 years beginning at age 60.  Clinical breast exam.** / Every 3 years for women in their 71s and 10s.  BRCA-related cancer risk assessment.** / For women who have family members with a BRCA-related cancer (breast, ovarian, tubal, or peritoneal cancers).  Pap test.** / Every 2 years from ages 76 through 26. Every 3 years starting at age 61 through age 76 or 93 with a history of 3 consecutive normal Pap tests.  HPV screening.** / Every 3 years from ages 37 through ages 60 to 51 with a history of 3 consecutive normal Pap tests.  Hepatitis C blood test.** / For any individual with known risks for hepatitis C.  Skin self-exam. / Monthly.  Influenza vaccine. / Every year.  Tetanus, diphtheria, and acellular pertussis (Tdap, Td) vaccine.** / Consult your health care provider. Pregnant women should receive 1 dose of Tdap vaccine during each pregnancy. 1 dose of Td every 10 years.  Varicella vaccine.** / Consult your health care provider. Pregnant females who do not have evidence of immunity should receive the first dose after pregnancy.  HPV vaccine. / 3 doses over 6 months, if 93 and younger. The vaccine is not recommended for use in pregnant females. However, pregnancy testing is not needed before receiving a dose.  Measles, mumps, rubella (MMR) vaccine.** / You need at least 1 dose of MMR if you were born in 1957 or later. You may also need a 2nd dose. For females of childbearing age, rubella immunity should be determined. If there is no evidence of immunity, females who are not pregnant should be vaccinated. If there is no evidence of immunity, females who are  pregnant should delay immunization until after pregnancy.  Pneumococcal  13-valent conjugate (PCV13) vaccine.** / Consult your health care provider.  Pneumococcal polysaccharide (PPSV23) vaccine.** / 1 to 2 doses if you smoke cigarettes or if you have certain conditions.  Meningococcal vaccine.** / 1 dose if you are age 68 to 8 years and a Market researcher living in a residence hall, or have one of several medical conditions, you need to get vaccinated against meningococcal disease. You may also need additional booster doses.  Hepatitis A vaccine.** / Consult your health care provider.  Hepatitis B vaccine.** / Consult your health care provider.  Haemophilus influenzae type b (Hib) vaccine.** / Consult your health care provider. Ages 7 to 53 years  Blood pressure check.** / Every year.  Lipid and cholesterol check.** / Every 5 years beginning at age 25 years.  Lung cancer screening. / Every year if you are aged 11-80 years and have a 30-pack-year history of smoking and currently smoke or have quit within the past 15 years. Yearly screening is stopped once you have quit smoking for at least 15 years or develop a health problem that would prevent you from having lung cancer treatment.  Clinical breast exam.** / Every year after age 48 years.  BRCA-related cancer risk assessment.** / For women who have family members with a BRCA-related cancer (breast, ovarian, tubal, or peritoneal cancers).  Mammogram.** / Every year beginning at age 41 years and continuing for as long as you are in good health. Consult with your health care provider.  Pap test.** / Every 3 years starting at age 65 years through age 37 or 70 years with a history of 3 consecutive normal Pap tests.  HPV screening.** / Every 3 years from ages 72 years through ages 60 to 40 years with a history of 3 consecutive normal Pap tests.  Fecal occult blood test (FOBT) of stool. / Every year beginning at age 21 years and continuing until age 5 years. You may not need to do this test if you get  a colonoscopy every 10 years.  Flexible sigmoidoscopy or colonoscopy.** / Every 5 years for a flexible sigmoidoscopy or every 10 years for a colonoscopy beginning at age 35 years and continuing until age 48 years.  Hepatitis C blood test.** / For all people born from [birth year] through 1965 and any individual with known risks for hepatitis C.  Skin self-exam. / Monthly.  Influenza vaccine. / Every year.  Tetanus, diphtheria, and acellular pertussis (Tdap/Td) vaccine.** / Consult your health care provider. Pregnant women should receive 1 dose of Tdap vaccine during each pregnancy. 1 dose of Td every 10 years.  Varicella vaccine.** / Consult your health care provider. Pregnant females who do not have evidence of immunity should receive the first dose after pregnancy.  Zoster vaccine.** / 1 dose for adults aged 30 years or older.  Measles, mumps, rubella (MMR) vaccine.** / You need at least 1 dose of MMR if you were born in 1957 or later. You may also need a second dose. For females of childbearing age, rubella immunity should be determined. If there is no evidence of immunity, females who are not pregnant should be vaccinated. If there is no evidence of immunity, females who are pregnant should delay immunization until after pregnancy.  Pneumococcal 13-valent conjugate (PCV13) vaccine.** / Consult your health care provider.  Pneumococcal polysaccharide (PPSV23) vaccine.** / 1 to 2 doses if you smoke cigarettes or if you have certain conditions.  Meningococcal vaccine.** /  Consult your health care provider.  Hepatitis A vaccine.** / Consult your health care provider.  Hepatitis B vaccine.** / Consult your health care provider.  Haemophilus influenzae type b (Hib) vaccine.** / Consult your health care provider. Ages 64 years and over  Blood pressure check.** / Every year.  Lipid and cholesterol check.** / Every 5 years beginning at age 23 years.  Lung cancer screening. / Every year if you  are aged 16-80 years and have a 30-pack-year history of smoking and currently smoke or have quit within the past 15 years. Yearly screening is stopped once you have quit smoking for at least 15 years or develop a health problem that would prevent you from having lung cancer treatment.  Clinical breast exam.** / Every year after age 74 years.  BRCA-related cancer risk assessment.** / For women who have family members with a BRCA-related cancer (breast, ovarian, tubal, or peritoneal cancers).  Mammogram.** / Every year beginning at age 44 years and continuing for as long as you are in good health. Consult with your health care provider.  Pap test.** / Every 3 years starting at age 58 years through age 22 or 39 years with 3 consecutive normal Pap tests. Testing can be stopped between 65 and 70 years with 3 consecutive normal Pap tests and no abnormal Pap or HPV tests in the past 10 years.  HPV screening.** / Every 3 years from ages 64 years through ages 70 or 61 years with a history of 3 consecutive normal Pap tests. Testing can be stopped between 65 and 70 years with 3 consecutive normal Pap tests and no abnormal Pap or HPV tests in the past 10 years.  Fecal occult blood test (FOBT) of stool. / Every year beginning at age 40 years and continuing until age 27 years. You may not need to do this test if you get a colonoscopy every 10 years.  Flexible sigmoidoscopy or colonoscopy.** / Every 5 years for a flexible sigmoidoscopy or every 10 years for a colonoscopy beginning at age 7 years and continuing until age 32 years.  Hepatitis C blood test.** / For all people born from [birth year] through 1967 and any individual with known risks for hepatitis C.  Osteoporosis screening.** / A one-time screening for women ages 30 years and over and women at risk for fractures or osteoporosis.  Skin self-exam. / Monthly.  Influenza vaccine. / Every year.  Tetanus, diphtheria, and acellular pertussis (Tdap/Td)  vaccine.** / 1 dose of Td every 10 years.  Varicella vaccine.** / Consult your health care provider.  Zoster vaccine.** / 1 dose for adults aged 35 years or older.  Pneumococcal 13-valent conjugate (PCV13) vaccine.** / Consult your health care provider.  Pneumococcal polysaccharide (PPSV23) vaccine.** / 1 dose for all adults aged 46 years and older.  Meningococcal vaccine.** / Consult your health care provider.  Hepatitis A vaccine.** / Consult your health care provider.  Hepatitis B vaccine.** / Consult your health care provider.  Haemophilus influenzae type b (Hib) vaccine.** / Consult your health care provider. ** Family history and personal history of risk and conditions may change your health care provider's recommendations.   This information is not intended to replace advice given to you by your health care provider. Make sure you discuss any questions you have with your health care provider.   Document Released: 08/07/2001 Document Revised: 07/02/2014 Document Reviewed: 11/06/2010 Elsevier Interactive Patient Education [employer].

## 2016-01-13 NOTE — Progress Notes (Signed)
Pre visit review using our clinic review tool, if applicable. No additional management support is needed unless otherwise documented below in the visit note. 

## 2016-01-13 NOTE — Progress Notes (Signed)
Subjective:     Beth Shepard is a 60 y.o. female and is here for a comprehensive physical exam. The patient reports no problems.  Social History   Social History  . Marital Status: Widowed    Spouse Name: N/A  . Number of Children: 2  . Years of Education: N/A   Occupational History  . Administive     Social History Main Topics  . Smoking status: Former Smoker -- 33 years    Types: Cigarettes    Quit date: 06/25/2002  . Smokeless tobacco: Never Used  . Alcohol Use: Yes     Comment: rare  . Drug Use: No  . Sexual Activity:    Partners: Male   Other Topics Concern  . Not on file   Social History Narrative   Limited exercise per cardiology   Health Maintenance  Topic Date Due  . HIV Screening  07/18/1968  . MAMMOGRAM  07/19/2003  . ZOSTAVAX  07/18/2013  . COLONOSCOPY  06/07/2015  . OPHTHALMOLOGY EXAM  09/07/2015  . INFLUENZA VACCINE  08/24/2016 (Originally 01/24/2016)  . HEMOGLOBIN A1C  02/25/2016  . PNEUMOCOCCAL POLYSACCHARIDE VACCINE (2) 11/13/2017  . TETANUS/TDAP  11/14/2022  . Hepatitis C Screening  Completed    The following portions of the patient's history were reviewed and updated as appropriate:  She  has a past medical history of COPD (chronic obstructive pulmonary disease) ([place]); Diabetes mellitus; GERD (gastroesophageal reflux disease); Hyperlipidemia; Hypertension; Anxiety; Shingles; Congestive heart failure ([place]); Gastric polyps; LBBB (left bundle branch block); Pneumonia; Bilateral edema of lower extremity; IBS (irritable bowel syndrome); Rheumatoid arthritis ([place]); and PONV (postoperative nausea and vomiting). She  does not have any pertinent problems on file. She  has past surgical history that includes Vaginal hysterectomy; Cholecystectomy; Knee arthroscopy (Left); Colonoscopy (06/06/2005); Esophagogastroduodenoscopy (06/06/2005, 11/17/2013); Cardiac catheterization; Tubal ligation; Dilation and curettage of uterus; and Total hip arthroplasty (Right,  10/24/2015). Her family history includes Cancer in her sister; GER disease in her mother; Gallstones in her mother; Heart disease in her maternal grandfather and mother; Heart disease (age of onset: 44) in her maternal grandmother; Hiatal hernia in her mother; Hypertension in her daughter, mother, paternal grandfather, and paternal grandmother; Lymphoma in her mother; Rheum arthritis in her father; Stroke in her paternal grandfather and paternal grandmother. There is no history of Colon cancer, Esophageal cancer, Pancreatic cancer, Liver disease, or Kidney disease. She  reports that she quit smoking about 13 years ago. Her smoking use included Cigarettes. She quit after 33 years of use. She has never used smokeless tobacco. She reports that she drinks alcohol. She reports that she does not use illicit drugs. She has a current medication list which includes the following prescription(s): alprazolam, antiseptic oral rinse, aspirin, carvedilol, docusate sodium, furosemide, glimepiride, glucose blood, hydrocodone-acetaminophen, hyoscyamine sulfate, lisinopril, ondansetron, pantoprazole, simvastatin, and spironolactone. Current Outpatient Prescriptions on File Prior to Visit  Medication Sig Dispense Refill  . ALPRAZolam (XANAX) 0.25 MG tablet TAKE 1 AND 1/2 TABLETS BY MOUTH EVERY MORNING 90 tablet 0  . antiseptic oral rinse (BIOTENE) LIQD 15 mLs by Mouth Rinse route every 4 (four) hours as needed for dry mouth.    Marland Kitchen aspirin 81 MG tablet Take 81 mg by mouth daily.    . carvedilol (COREG) 12.5 MG tablet Take 1 tablet by mouth 2 (two) times daily.    Marland Kitchen docusate sodium (COLACE) 100 MG capsule Take 100 mg by mouth 2 (two) times daily.    . furosemide (LASIX) 20 MG tablet Take  1 tablet (20 mg total) by mouth daily. Dr Ola Spurr 30 tablet   . glimepiride (AMARYL) 2 MG tablet TAKE 1 TABLET (2 MG TOTAL) BY MOUTH DAILY BEFORE BREAKFAST. 90 tablet 2  . glucose blood (ONE TOUCH TEST STRIPS) test strip 1 each by Other  route as needed. Reported on 11/02/2015    . HYDROcodone-acetaminophen (NORCO/VICODIN) 5-325 MG tablet Take 1-2 tablets by mouth every 4 (four) hours as needed for moderate pain. 120 tablet 0  . Hyoscyamine Sulfate 0.375 MG TBCR Take 1 tablet (0.375 mg total) by mouth 2 (two) times daily. 60 tablet 1  . lisinopril (PRINIVIL,ZESTRIL) 20 MG tablet TAKE 1 TABLET (20 MG TOTAL) BY MOUTH DAILY. 90 tablet 0  . ondansetron (ZOFRAN) 4 MG tablet Take 1-2 tablets (4-8 mg total) by mouth every 8 (eight) hours as needed for nausea or vomiting. 40 tablet 0  . pantoprazole (PROTONIX) 20 MG tablet TAKE 1 TABLET (20 MG TOTAL) BY MOUTH DAILY. 90 tablet 1  . simvastatin (ZOCOR) 40 MG tablet TAKE 1 TABLET BY MOUTH IN THE EVENING- 90 tablet 3  . spironolactone (ALDACTONE) 25 MG tablet Take 1 tablet (25 mg total) by mouth every other day. Dr Ola Spurr     No current facility-administered medications on file prior to visit.   She is allergic to prednisone; codeine; and ibuprofen..  Review of Systems  Review of Systems  Constitutional: Negative for activity change, appetite change and fatigue.  HENT: Negative for hearing loss, congestion, tinnitus and ear discharge.  dentist q40m Eyes: Negative for visual disturbance (see optho q1y -- vision corrected to 20/20 with glasses).  Respiratory: Negative for cough, chest tightness and shortness of breath.   Cardiovascular: Negative for chest pain, palpitations and leg swelling.  Gastrointestinal: Negative for abdominal pain, diarrhea, constipation and abdominal distention.  Genitourinary: Negative for urgency, frequency, decreased urine volume and difficulty urinating.  Musculoskeletal: Negative for back pain, arthralgias and gait problem.  Skin: Negative for color change, pallor and rash.  Neurological: Negative for dizziness, light-headedness, numbness and headaches.  Hematological: Negative for adenopathy. Does not bruise/bleed easily.  Psychiatric/Behavioral:  Negative for suicidal ideas, confusion, sleep disturbance, self-injury, dysphoric mood, decreased concentration and agitation.      Objective:    BP 100/60 mmHg  Pulse 90  Temp(Src) 98 F (36.7 C) (Oral)  Ht 5\' 2"  (1.575 m)  Wt 194 lb 3.2 oz (88.089 kg)  BMI 35.51 kg/m2  SpO2 97% General appearance: alert, cooperative, appears stated age and no distress Head: Normocephalic, without obvious abnormality, atraumatic Eyes: conjunctivae/corneas clear. PERRL, EOM's intact. Fundi benign. Ears: normal TM's and external ear canals both ears Nose: Nares normal. Septum midline. Mucosa normal. No drainage or sinus tenderness. Throat: lips, mucosa, and tongue normal; teeth and gums normal Neck: no adenopathy, no carotid bruit, no JVD, supple, symmetrical, trachea midline and thyroid not enlarged, symmetric, no tenderness/mass/nodules Back: symmetric, no curvature. ROM normal. No CVA tenderness. Lungs: clear to auscultation bilaterally Breasts: normal appearance, no masses or tenderness Heart: regular rate and rhythm, S1, S2 normal, no murmur, click, rub or gallop Abdomen: soft, non-tender; bowel sounds normal; no masses,  no organomegaly Pelvic: not indicated; status post hysterectomy, negative ROS Extremities: extremities normal, atraumatic, no cyanosis or edema Pulses: 2+ and symmetric Skin: Skin color, texture, turgor normal. No rashes or lesions Lymph nodes: Cervical, supraclavicular, and axillary nodes normal. Neurologic: Alert and oriented X 3, normal strength and tone. Normal symmetric reflexes. Normal coordination and gait    Assessment:  Healthy female exam.     Plan:    ghm utd  Check labs See After Visit Summary for Counseling Recommendations    1. Rheumatoid arthritis involving multiple joints (HCC) Dx years ago,  Wants a new referral, has not seen  - Ambulatory referral to Rheumatology - Rheumatoid factor  2. Preventative health care See above  3. DM (diabetes  mellitus) type II uncontrolled, periph vascular disorder (HCC) con't meds Check labs - POCT urinalysis dipstick - CBC with Differential/Platelet - Comprehensive metabolic panel - Hemoglobin A1c - Ambulatory referral to Podiatry - Ambulatory referral to Ophthalmology - ALPRAZolam (XANAX) 0.25 MG tablet; TAKE 1 AND 1/2 TABLETS BY MOUTH EVERY MORNING  Dispense: 90 tablet; Refill: 1 - carvedilol (COREG) 12.5 MG tablet; Take 1 tablet (12.5 mg total) by mouth 2 (two) times daily.  Dispense: 90 tablet; Refill: 1 - furosemide (LASIX) 20 MG tablet; Take 1 tablet (20 mg total) by mouth daily. Dr Ola Spurr  Dispense: 30 tablet - glimepiride (AMARYL) 2 MG tablet; TAKE 1 TABLET (2 MG TOTAL) BY MOUTH DAILY BEFORE BREAKFAST.  Dispense: 90 tablet; Refill: 2 - pantoprazole (PROTONIX) 20 MG tablet; TAKE 1 TABLET (20 MG TOTAL) BY MOUTH DAILY.  Dispense: 90 tablet; Refill: 3 - simvastatin (ZOCOR) 40 MG tablet; TAKE 1 TABLET BY MOUTH IN THE EVENING-  Dispense: 90 tablet; Refill: 3  4. Essential hypertension Stable con't meds - POCT urinalysis dipstick - Lipid panel - CBC with Differential/Platelet - Comprehensive metabolic panel - Hemoglobin A1c - ALPRAZolam (XANAX) 0.25 MG tablet; TAKE 1 AND 1/2 TABLETS BY MOUTH EVERY MORNING  Dispense: 90 tablet; Refill: 1 - carvedilol (COREG) 12.5 MG tablet; Take 1 tablet (12.5 mg total) by mouth 2 (two) times daily.  Dispense: 90 tablet; Refill: 1 - furosemide (LASIX) 20 MG tablet; Take 1 tablet (20 mg total) by mouth daily. Dr Ola Spurr  Dispense: 30 tablet - glimepiride (AMARYL) 2 MG tablet; TAKE 1 TABLET (2 MG TOTAL) BY MOUTH DAILY BEFORE BREAKFAST.  Dispense: 90 tablet; Refill: 2 - pantoprazole (PROTONIX) 20 MG tablet; TAKE 1 TABLET (20 MG TOTAL) BY MOUTH DAILY.  Dispense: 90 tablet; Refill: 3 - simvastatin (ZOCOR) 40 MG tablet; TAKE 1 TABLET BY MOUTH IN THE EVENING-  Dispense: 90 tablet; Refill: 3  5. Hyperlipidemia   - Lipid panel - Comprehensive metabolic  panel - ALPRAZolam (XANAX) 0.25 MG tablet; TAKE 1 AND 1/2 TABLETS BY MOUTH EVERY MORNING  Dispense: 90 tablet; Refill: 1 - carvedilol (COREG) 12.5 MG tablet; Take 1 tablet (12.5 mg total) by mouth 2 (two) times daily.  Dispense: 90 tablet; Refill: 1 - furosemide (LASIX) 20 MG tablet; Take 1 tablet (20 mg total) by mouth daily. Dr Ola Spurr  Dispense: 30 tablet - glimepiride (AMARYL) 2 MG tablet; TAKE 1 TABLET (2 MG TOTAL) BY MOUTH DAILY BEFORE BREAKFAST.  Dispense: 90 tablet; Refill: 2 - pantoprazole (PROTONIX) 20 MG tablet; TAKE 1 TABLET (20 MG TOTAL) BY MOUTH DAILY.  Dispense: 90 tablet; Refill: 3 - simvastatin (ZOCOR) 40 MG tablet; TAKE 1 TABLET BY MOUTH IN THE EVENING-  Dispense: 90 tablet; Refill: 3  6. Hyperlipemia   - ALPRAZolam (XANAX) 0.25 MG tablet; TAKE 1 AND 1/2 TABLETS BY MOUTH EVERY MORNING  Dispense: 90 tablet; Refill: 1 - carvedilol (COREG) 12.5 MG tablet; Take 1 tablet (12.5 mg total) by mouth 2 (two) times daily.  Dispense: 90 tablet; Refill: 1 - furosemide (LASIX) 20 MG tablet; Take 1 tablet (20 mg total) by mouth daily. Dr Ola Spurr  Dispense: 22  tablet - pantoprazole (PROTONIX) 20 MG tablet; TAKE 1 TABLET (20 MG TOTAL) BY MOUTH DAILY.  Dispense: 90 tablet; Refill: 3 - simvastatin (ZOCOR) 40 MG tablet; TAKE 1 TABLET BY MOUTH IN THE EVENING-  Dispense: 90 tablet; Refill: 3  7. Hematuria   - Urine culture

## 2016-01-15 LAB — URINE CULTURE: ORGANISM ID, BACTERIA: NO GROWTH

## 2016-01-27 ENCOUNTER — Other Ambulatory Visit: Payer: Self-pay

## 2016-01-27 DIAGNOSIS — R7989 Other specified abnormal findings of blood chemistry: Secondary | ICD-10-CM

## 2016-02-01 ENCOUNTER — Ambulatory Visit: Payer: 59 | Admitting: Sports Medicine

## 2016-02-07 ENCOUNTER — Other Ambulatory Visit: Payer: Self-pay

## 2016-02-07 DIAGNOSIS — E1165 Type 2 diabetes mellitus with hyperglycemia: Principal | ICD-10-CM

## 2016-02-07 DIAGNOSIS — E785 Hyperlipidemia, unspecified: Secondary | ICD-10-CM

## 2016-02-07 DIAGNOSIS — I10 Essential (primary) hypertension: Secondary | ICD-10-CM

## 2016-02-07 DIAGNOSIS — E1151 Type 2 diabetes mellitus with diabetic peripheral angiopathy without gangrene: Secondary | ICD-10-CM

## 2016-02-07 DIAGNOSIS — IMO0002 Reserved for concepts with insufficient information to code with codable children: Secondary | ICD-10-CM

## 2016-02-07 MED ORDER — CARVEDILOL 12.5 MG PO TABS: 12.5000 mg | ORAL_TABLET | Freq: Two times a day (BID) | ORAL | 1 refills | Status: DC

## 2016-02-11 ENCOUNTER — Encounter (INDEPENDENT_AMBULATORY_CARE_PROVIDER_SITE_OTHER): Payer: Self-pay

## 2016-03-19 ENCOUNTER — Telehealth: Payer: Self-pay | Admitting: Family Medicine

## 2016-03-19 NOTE — Telephone Encounter (Signed)
Last seen 01/13/16 and filled 08/22/15 #120 She was told she would not be able to get the hydrocodone anymore.  Please advise    KP

## 2016-03-19 NOTE — Telephone Encounter (Signed)
Ok to refill ultram

## 2016-03-19 NOTE — Telephone Encounter (Signed)
Caller name: [name] Relationship to patient: self Can be reached: [phone] work # (cannot use cell at work)  Reason for call: pt called for refill on tramadol stating she is out. She also needs to discuss labs for kidney? Requesting call back.

## 2016-03-20 MED ORDER — TRAMADOL HCL 50 MG PO TABS: 50.0000 mg | ORAL_TABLET | Freq: Four times a day (QID) | ORAL | 0 refills | Status: DC | PRN

## 2016-03-20 NOTE — Telephone Encounter (Signed)
Faxed to [facility] [place]. VM left to call the office     KP

## 2016-03-20 NOTE — Telephone Encounter (Signed)
Patient is aware medication is being faxed.    KP

## 2016-03-21 ENCOUNTER — Telehealth: Payer: Self-pay | Admitting: Family Medicine

## 2016-03-21 ENCOUNTER — Other Ambulatory Visit (INDEPENDENT_AMBULATORY_CARE_PROVIDER_SITE_OTHER): Payer: 59

## 2016-03-21 DIAGNOSIS — R748 Abnormal levels of other serum enzymes: Secondary | ICD-10-CM

## 2016-03-21 DIAGNOSIS — R7989 Other specified abnormal findings of blood chemistry: Secondary | ICD-10-CM

## 2016-03-21 LAB — BASIC METABOLIC PANEL
BUN: 28 mg/dL — ABNORMAL HIGH (ref 6–23)
CALCIUM: 9.6 mg/dL (ref 8.4–10.5)
CHLORIDE: 104 meq/L (ref 96–112)
CO2: 29 meq/L (ref 19–32)
CREATININE: 1.23 mg/dL — AB (ref 0.40–1.20)
GFR: 46.92 mL/min — ABNORMAL LOW (ref >60.00)
GLUCOSE: 109 mg/dL — AB (ref 70–99)
Potassium: 5.1 mEq/L (ref 3.5–5.1)
Sodium: 139 mEq/L (ref 135–145)

## 2016-03-21 NOTE — Telephone Encounter (Signed)
Relation to [id number]  Call back number: [phone]   Reason for call:  Patient has a few question regarding medication management,. Patient states carvedilol (COREG) 12.5 MG tablet was prescribed originally from cardiologist and now PCP is prescribing. Please advise

## 2016-03-22 NOTE — Telephone Encounter (Signed)
Patient was calling to confirm if Dr.Lowne sent her medication. I made her aware yes. She verbalized understanding. She said she does have a follow up with Dr.Cheek in January.    KP

## 2016-03-23 ENCOUNTER — Telehealth: Payer: Self-pay | Admitting: Family Medicine

## 2016-03-23 ENCOUNTER — Other Ambulatory Visit: Payer: Self-pay | Admitting: Family Medicine

## 2016-03-23 DIAGNOSIS — R7989 Other specified abnormal findings of blood chemistry: Secondary | ICD-10-CM

## 2016-03-23 NOTE — Telephone Encounter (Signed)
Pt called back returning call for lab results.

## 2016-04-04 ENCOUNTER — Encounter: Payer: Self-pay | Admitting: Family Medicine

## 2016-04-06 ENCOUNTER — Ambulatory Visit (INDEPENDENT_AMBULATORY_CARE_PROVIDER_SITE_OTHER): Payer: 59 | Admitting: Family Medicine

## 2016-04-06 VITALS — BP 105/69 | HR 97 | Temp 99.0°F | Resp 17 | Ht 63.0 in | Wt 204.0 lb

## 2016-04-06 DIAGNOSIS — R110 Nausea: Secondary | ICD-10-CM

## 2016-04-06 DIAGNOSIS — E1151 Type 2 diabetes mellitus with diabetic peripheral angiopathy without gangrene: Secondary | ICD-10-CM

## 2016-04-06 DIAGNOSIS — N1831 Chronic kidney disease, stage 3a: Secondary | ICD-10-CM

## 2016-04-06 DIAGNOSIS — E785 Hyperlipidemia, unspecified: Secondary | ICD-10-CM

## 2016-04-06 DIAGNOSIS — E1165 Type 2 diabetes mellitus with hyperglycemia: Secondary | ICD-10-CM

## 2016-04-06 DIAGNOSIS — J209 Acute bronchitis, unspecified: Secondary | ICD-10-CM

## 2016-04-06 DIAGNOSIS — N183 Chronic kidney disease, stage 3 (moderate): Secondary | ICD-10-CM

## 2016-04-06 DIAGNOSIS — I10 Essential (primary) hypertension: Secondary | ICD-10-CM

## 2016-04-06 DIAGNOSIS — IMO0002 Reserved for concepts with insufficient information to code with codable children: Secondary | ICD-10-CM

## 2016-04-06 DIAGNOSIS — E1140 Type 2 diabetes mellitus with diabetic neuropathy, unspecified: Secondary | ICD-10-CM

## 2016-04-06 LAB — COMPREHENSIVE METABOLIC PANEL
ALT: 13 U/L (ref 0–35)
AST: 15 U/L (ref 0–37)
Albumin: 4.3 g/dL (ref 3.5–5.2)
Alkaline Phosphatase: 84 U/L (ref 39–117)
BUN: 21 mg/dL (ref 6–23)
CHLORIDE: 103 meq/L (ref 96–112)
CO2: 27 meq/L (ref 19–32)
CREATININE: 1.14 mg/dL (ref 0.40–1.20)
Calcium: 9.8 mg/dL (ref 8.4–10.5)
GFR: 51.21 mL/min — ABNORMAL LOW (ref >60.00)
GLUCOSE: 100 mg/dL — AB (ref 70–99)
Potassium: 4.4 mEq/L (ref 3.5–5.1)
SODIUM: 139 meq/L (ref 135–145)
Total Bilirubin: 0.4 mg/dL (ref 0.2–1.2)
Total Protein: 7.4 g/dL (ref 6.0–8.3)

## 2016-04-06 LAB — CBC WITH DIFFERENTIAL/PLATELET
BASOS ABS: 0.1 10*3/uL (ref 0.0–0.1)
Basophils Relative: 0.8 % (ref 0.0–3.0)
Eosinophils Absolute: 0.3 10*3/uL (ref 0.0–0.7)
Eosinophils Relative: 3.8 % (ref 0.0–5.0)
HCT: 32.9 % — ABNORMAL LOW (ref 36.0–46.0)
Hemoglobin: 10.9 g/dL — ABNORMAL LOW (ref 12.0–15.0)
LYMPHS ABS: 1.8 10*3/uL (ref 0.7–4.0)
Lymphocytes Relative: 22.1 % (ref 12.0–46.0)
MCHC: 33 g/dL (ref 30.0–36.0)
MCV: 84.7 fl (ref 78.0–100.0)
MONO ABS: 0.5 10*3/uL (ref 0.1–1.0)
MONOS PCT: 5.5 % (ref 3.0–12.0)
NEUTROS ABS: 5.6 10*3/uL (ref 1.4–7.7)
NEUTROS PCT: 67.8 % (ref 43.0–77.0)
PLATELETS: 317 10*3/uL (ref 150.0–400.0)
RBC: 3.89 Mil/uL (ref 3.87–5.11)
RDW: 13.8 % (ref 11.5–15.5)
WBC: 8.3 10*3/uL (ref 4.0–10.5)

## 2016-04-06 LAB — LIPID PANEL
CHOL/HDL RATIO: 4
CHOLESTEROL: 192 mg/dL (ref 0–200)
HDL: 48.2 mg/dL (ref >39.00)
LDL CALC: 115 mg/dL — AB (ref 0–99)
NonHDL: 143.59
Triglycerides: 142 mg/dL (ref 0.0–149.0)
VLDL: 28.4 mg/dL (ref 0.0–40.0)

## 2016-04-06 LAB — HEMOGLOBIN A1C: Hgb A1c MFr Bld: 6.3 % (ref 4.6–6.5)

## 2016-04-06 MED ORDER — ONDANSETRON HCL 4 MG PO TABS: 4.0000 mg | ORAL_TABLET | Freq: Three times a day (TID) | ORAL | 0 refills | Status: DC | PRN

## 2016-04-06 MED ORDER — AZITHROMYCIN 250 MG PO TABS: ORAL_TABLET | ORAL | 0 refills | Status: DC

## 2016-04-06 MED ORDER — LEVOCETIRIZINE DIHYDROCHLORIDE 5 MG PO TABS: 5.0000 mg | ORAL_TABLET | Freq: Every evening | ORAL | 5 refills | Status: DC

## 2016-04-06 NOTE — Progress Notes (Signed)
Patient ID: Beth Shepard, female    DOB: January 19, 1956  Age: 60 y.o. MRN: [id number]    Subjective:  Subjective  HPI Beth Shepard presents for sore throat and congestion since Monday.  She had a fever of 100.2 on Monday.  She stayed home Tuesday and went back Wednesday and Thursday but she had a low grade fever this am.  She has tried mucinex, robitussin and coricidin with no relief.   She is also requesting to f/u dm, cholesterol and bp.  Her appointment with nephrology appointment is pending.  Review of Systems  Constitutional: Positive for chills. Negative for fever.  HENT: Positive for postnasal drip, rhinorrhea, sneezing and sore throat. Negative for congestion and sinus pressure.   Respiratory: Positive for cough. Negative for chest tightness and shortness of breath.   Cardiovascular: Negative for chest pain, palpitations and leg swelling.  Allergic/Immunologic: Negative for environmental allergies.    History Past Medical History:  Diagnosis Date  . Anxiety   . Bilateral edema of lower extremity    Swelling in legs and ankles  . Congestive heart failure ([place])   . COPD (chronic obstructive pulmonary disease) ([place])   . Diabetes mellitus    type 2  . Gastric polyps   . GERD (gastroesophageal reflux disease)    chronic  . Hyperlipidemia   . Hypertension   . IBS (irritable bowel syndrome)   . LBBB (left bundle branch block)    Dr. Gwenith Spitz, [facility]  . Pneumonia    hx of  . PONV (postoperative nausea and vomiting)    Had N/V after having tubal ligation and D&C  . Rheumatoid arthritis ([place])   . Shingles     She has a past surgical history that includes Vaginal hysterectomy; Cholecystectomy; Knee arthroscopy (Left); Colonoscopy (06/06/2005); Esophagogastroduodenoscopy (06/06/2005, 11/17/2013); Cardiac catheterization; Tubal ligation; Dilation and curettage of uterus; Total hip arthroplasty (Right, 10/24/2015); and Joint replacement (Right, 10/24/2015).   Her  family history includes Cancer in her sister; GER disease in her mother; Gallstones in her mother; Heart disease in her maternal grandfather and mother; Heart disease (age of onset: 16) in her maternal grandmother; Hiatal hernia in her mother; Hypertension in her daughter, mother, paternal grandfather, and paternal grandmother; Lymphoma in her mother; Rheum arthritis in her father; Stroke in her paternal grandfather and paternal grandmother.She reports that she quit smoking about 13 years ago. Her smoking use included Cigarettes. She quit after 33.00 years of use. She has never used smokeless tobacco. She reports that she drinks alcohol. She reports that she does not use drugs.  Current Outpatient Prescriptions on File Prior to Visit  Medication Sig Dispense Refill  . ALPRAZolam (XANAX) 0.25 MG tablet TAKE 1 AND 1/2 TABLETS BY MOUTH EVERY MORNING (Patient taking differently: Take 0.25 mg by mouth daily. TAKE 1 AND 1/2 TABLETS BY MOUTH EVERY MORNING) 90 tablet 1  . aspirin 81 MG tablet Take 81 mg by mouth daily.    Marland Kitchen docusate sodium (COLACE) 100 MG capsule Take 100 mg by mouth 2 (two) times daily.    Marland Kitchen glucose blood (ONE TOUCH TEST STRIPS) test strip 1 each by Other route as needed. Reported on 11/02/2015    . HYDROcodone-acetaminophen (NORCO/VICODIN) 5-325 MG tablet Take 1-2 tablets by mouth every 4 (four) hours as needed for moderate pain. 120 tablet 0  . Hyoscyamine Sulfate 0.375 MG TBCR Take 1 tablet (0.375 mg total) by mouth 2 (two) times daily. 60 tablet 1  . pantoprazole (PROTONIX) 20 MG tablet  TAKE 1 TABLET (20 MG TOTAL) BY MOUTH DAILY. 90 tablet 3  . traMADol (ULTRAM) 50 MG tablet Take 1-2 tablets (50-100 mg total) by mouth every 6 (six) hours as needed. 60 tablet 0  . antiseptic oral rinse (BIOTENE) LIQD 15 mLs by Mouth Rinse route every 4 (four) hours as needed for dry mouth.     No current facility-administered medications on file prior to visit.      Objective:  Objective  Physical Exam   Constitutional: She is oriented to person, place, and time. She appears well-developed and well-nourished.  HENT:  Right Ear: External ear normal.  Left Ear: External ear normal.  + PND + errythema  Eyes: Conjunctivae are normal. Right eye exhibits no discharge. Left eye exhibits no discharge.  Cardiovascular: Normal rate, regular rhythm and normal heart sounds.   No murmur heard. Pulmonary/Chest: Effort normal and breath sounds normal. No respiratory distress. She has no wheezes. She has no rales. She exhibits no tenderness.  Musculoskeletal: She exhibits no edema.  Lymphadenopathy:    She has cervical adenopathy.  Neurological: She is alert and oriented to person, place, and time.  Nursing note and vitals reviewed.  BP 105/69 (BP Location: Left Arm, Patient Position: Sitting, Cuff Size: Large)   Pulse 97   Temp 99 F (37.2 C) (Oral)   Resp 17   Ht 5\' 3"  (1.6 m)   Wt 204 lb (92.5 kg)   SpO2 97%   BMI 36.14 kg/m  Wt Readings from Last 3 Encounters:  04/06/16 204 lb (92.5 kg)  02/11/16 190 lb (86.2 kg)  01/13/16 194 lb 3.2 oz (88.1 kg)     Lab Results  Component Value Date   WBC 8.3 04/06/2016   HGB 10.9 (L) 04/06/2016   HCT 32.9 (L) 04/06/2016   PLT 317.0 04/06/2016   GLUCOSE 100 (H) 04/06/2016   CHOL 192 04/06/2016   TRIG 142.0 04/06/2016   HDL 48.20 04/06/2016   LDLDIRECT 114.0 08/25/2015   LDLCALC 115 (H) 04/06/2016   ALT 13 04/06/2016   AST 15 04/06/2016   NA 139 04/06/2016   K 4.4 04/06/2016   CL 103 04/06/2016   CREATININE 1.14 04/06/2016   BUN 21 04/06/2016   CO2 27 04/06/2016   TSH 0.890 10/21/2013   INR 0.99 10/14/2015   HGBA1C 6.3 04/06/2016   MICROALBUR 0.8 09/07/2014    No results found.   Assessment & Plan:  Plan  I am having Ms. Karabin start on azithromycin and levocetirizine. I am also having her maintain her glucose blood, aspirin, HYDROcodone-acetaminophen, Hyoscyamine Sulfate, docusate sodium, antiseptic oral rinse, ALPRAZolam,  pantoprazole, traMADol, gabapentin, ondansetron, spironolactone, simvastatin, lisinopril, glimepiride, furosemide, and carvedilol.  Meds ordered this encounter  Medications  . gabapentin (NEURONTIN) 300 MG capsule    Sig: Take 300 mg by mouth at bedtime.  . ondansetron (ZOFRAN) 4 MG tablet    Sig: Take 1-2 tablets (4-8 mg total) by mouth every 8 (eight) hours as needed for nausea or vomiting.    Dispense:  40 tablet    Refill:  0  . azithromycin (ZITHROMAX Z-PAK) 250 MG tablet    Sig: As directed    Dispense:  6 each    Refill:  0  . levocetirizine (XYZAL) 5 MG tablet    Sig: Take 1 tablet (5 mg total) by mouth every evening.    Dispense:  30 tablet    Refill:  5  . spironolactone (ALDACTONE) 25 MG tablet    Sig: Take  1 tablet (25 mg total) by mouth every other day. Dr Ola Spurr  . simvastatin (ZOCOR) 40 MG tablet    Sig: TAKE 1 TABLET BY MOUTH IN THE EVENING-    Dispense:  90 tablet    Refill:  3  . lisinopril (PRINIVIL,ZESTRIL) 20 MG tablet    Sig: TAKE 1 TABLET (20 MG TOTAL) BY MOUTH DAILY.    Dispense:  90 tablet    Refill:  0  . glimepiride (AMARYL) 2 MG tablet    Sig: TAKE 1 TABLET (2 MG TOTAL) BY MOUTH DAILY BEFORE BREAKFAST.    Dispense:  90 tablet    Refill:  2  . furosemide (LASIX) 20 MG tablet    Sig: Take 1 tablet (20 mg total) by mouth daily. Dr Ola Spurr    Dispense:  30 tablet  . carvedilol (COREG) 12.5 MG tablet    Sig: Take 1 tablet (12.5 mg total) by mouth 2 (two) times daily.    Dispense:  180 tablet    Refill:  1    Problem List Items Addressed This Visit      Unprioritized   ACUTE BRONCHITIS   Relevant Medications   azithromycin (ZITHROMAX Z-PAK) 250 MG tablet   levocetirizine (XYZAL) 5 MG tablet   Diabetes mellitus (HCC)   Relevant Medications   simvastatin (ZOCOR) 40 MG tablet   lisinopril (PRINIVIL,ZESTRIL) 20 MG tablet   glimepiride (AMARYL) 2 MG tablet   Other Relevant Orders   CBC with Differential/Platelet (Completed)   Lipid panel  (Completed)   Comprehensive metabolic panel (Completed)   Hemoglobin A1c (Completed)   Essential hypertension   Relevant Medications   spironolactone (ALDACTONE) 25 MG tablet   simvastatin (ZOCOR) 40 MG tablet   lisinopril (PRINIVIL,ZESTRIL) 20 MG tablet   glimepiride (AMARYL) 2 MG tablet   furosemide (LASIX) 20 MG tablet   carvedilol (COREG) 12.5 MG tablet   Hyperlipidemia   Relevant Medications   spironolactone (ALDACTONE) 25 MG tablet   simvastatin (ZOCOR) 40 MG tablet   lisinopril (PRINIVIL,ZESTRIL) 20 MG tablet   glimepiride (AMARYL) 2 MG tablet   furosemide (LASIX) 20 MG tablet   carvedilol (COREG) 12.5 MG tablet   Chronic kidney disease (CKD) stage G3a/A1, moderately decreased glomerular filtration rate (GFR) between 45-59 mL/min/1.73 square meter and albuminuria creatinine ratio less than 30 mg/g    Recheck today Nephrology pending       Other Visit Diagnoses    Nausea without vomiting    -  Primary   Relevant Medications   ondansetron (ZOFRAN) 4 MG tablet   Hyperlipidemia LDL goal <70       Relevant Medications   spironolactone (ALDACTONE) 25 MG tablet   simvastatin (ZOCOR) 40 MG tablet   lisinopril (PRINIVIL,ZESTRIL) 20 MG tablet   furosemide (LASIX) 20 MG tablet   carvedilol (COREG) 12.5 MG tablet   Other Relevant Orders   CBC with Differential/Platelet (Completed)   Lipid panel (Completed)   Comprehensive metabolic panel (Completed)   Hemoglobin A1c (Completed)   Hypertension, unspecified type       Relevant Medications   spironolactone (ALDACTONE) 25 MG tablet   simvastatin (ZOCOR) 40 MG tablet   lisinopril (PRINIVIL,ZESTRIL) 20 MG tablet   furosemide (LASIX) 20 MG tablet   carvedilol (COREG) 12.5 MG tablet   Other Relevant Orders   CBC with Differential/Platelet (Completed)   Lipid panel (Completed)   Comprehensive metabolic panel (Completed)   Hemoglobin A1c (Completed)   DM (diabetes mellitus) type II uncontrolled, periph vascular disorder (  HCC)        Relevant Medications   spironolactone (ALDACTONE) 25 MG tablet   simvastatin (ZOCOR) 40 MG tablet   lisinopril (PRINIVIL,ZESTRIL) 20 MG tablet   glimepiride (AMARYL) 2 MG tablet   furosemide (LASIX) 20 MG tablet   carvedilol (COREG) 12.5 MG tablet      Follow-up: Return in about 6 months (around 10/05/2016).  Ann Held, DO

## 2016-04-06 NOTE — Patient Instructions (Signed)

## 2016-04-06 NOTE — Progress Notes (Signed)
Pre visit review using our clinic review tool, if applicable. No additional management support is needed unless otherwise documented below in the visit note. 

## 2016-04-07 ENCOUNTER — Encounter: Payer: Self-pay | Admitting: Family Medicine

## 2016-04-07 DIAGNOSIS — N183 Chronic kidney disease, stage 3 (moderate): Secondary | ICD-10-CM

## 2016-04-07 DIAGNOSIS — N1831 Chronic kidney disease, stage 3a: Secondary | ICD-10-CM | POA: Insufficient documentation

## 2016-04-07 MED ORDER — CARVEDILOL 12.5 MG PO TABS: 12.5000 mg | ORAL_TABLET | Freq: Two times a day (BID) | ORAL | 1 refills | Status: DC

## 2016-04-07 MED ORDER — LISINOPRIL 20 MG PO TABS: ORAL_TABLET | ORAL | 0 refills | Status: DC

## 2016-04-07 MED ORDER — SPIRONOLACTONE 25 MG PO TABS: 25.0000 mg | ORAL_TABLET | ORAL | Status: DC

## 2016-04-07 MED ORDER — SIMVASTATIN 40 MG PO TABS: ORAL_TABLET | ORAL | 3 refills | Status: DC

## 2016-04-07 MED ORDER — GLIMEPIRIDE 2 MG PO TABS: ORAL_TABLET | ORAL | 2 refills | Status: DC

## 2016-04-07 MED ORDER — FUROSEMIDE 20 MG PO TABS: 20.0000 mg | ORAL_TABLET | Freq: Every day | ORAL | Status: DC

## 2016-04-07 NOTE — Assessment & Plan Note (Signed)
Recheck today Nephrology pending

## 2016-04-20 ENCOUNTER — Ambulatory Visit (INDEPENDENT_AMBULATORY_CARE_PROVIDER_SITE_OTHER): Payer: 59 | Admitting: Orthopaedic Surgery

## 2016-05-03 ENCOUNTER — Ambulatory Visit (INDEPENDENT_AMBULATORY_CARE_PROVIDER_SITE_OTHER): Payer: 59 | Admitting: Orthopaedic Surgery

## 2016-05-03 ENCOUNTER — Encounter (INDEPENDENT_AMBULATORY_CARE_PROVIDER_SITE_OTHER): Payer: Self-pay | Admitting: Orthopaedic Surgery

## 2016-05-03 ENCOUNTER — Ambulatory Visit (INDEPENDENT_AMBULATORY_CARE_PROVIDER_SITE_OTHER): Payer: 59

## 2016-05-03 DIAGNOSIS — M7061 Trochanteric bursitis, right hip: Secondary | ICD-10-CM

## 2016-05-03 DIAGNOSIS — M1611 Unilateral primary osteoarthritis, right hip: Secondary | ICD-10-CM | POA: Diagnosis not present

## 2016-05-03 MED ORDER — LIDOCAINE HCL 1 % IJ SOLN
3.0000 mL | INTRAMUSCULAR | Status: AC | PRN
  Administered 2016-05-03: 3 mL

## 2016-05-03 MED ORDER — BUPIVACAINE HCL 0.5 % IJ SOLN
3.0000 mL | INTRAMUSCULAR | Status: AC | PRN
  Administered 2016-05-03: 3 mL via INTRA_ARTICULAR

## 2016-05-03 MED ORDER — METHYLPREDNISOLONE ACETATE 40 MG/ML IJ SUSP
40.0000 mg | INTRAMUSCULAR | Status: AC | PRN
  Administered 2016-05-03: 40 mg via INTRA_ARTICULAR

## 2016-05-03 NOTE — Progress Notes (Signed)
Office Visit Note   Patient: Beth Shepard           Date of Birth: 01-14-1956           MRN: [id number] Visit Date: 05/03/2016              Requested by: Ann Held, DO [address] [place], [place] [address] PCP: Ann Held, DO   Assessment & Plan: Visit Diagnoses:  1. Primary osteoarthritis of right hip   2. Trochanteric bursitis, right hip     Plan: Discussed findings with the patient. Trochanteric injection was given today patient tolerated this well follow-up in 6 months with repeat AP pelvis.  Follow-Up Instructions: Return in about 6 months (around 10/31/2016) for recheck right THA.   Orders:  Orders Placed This Encounter  Procedures  . Large Joint Injection/Arthrocentesis  . XR Pelvis 1-2 Views   No orders of the defined types were placed in this encounter.     Procedures: Large Joint Inj Date/Time: 05/03/2016 8:57 AM Performed by: Leandrew Koyanagi Authorized by: Leandrew Koyanagi   Consent Given by:  Patient Timeout: prior to procedure the correct patient, procedure, and site was verified   Indications:  Pain Location:  Hip Site:  R greater trochanter Prep: patient was prepped and draped in usual sterile fashion   Needle Size:  22 G Approach:  Lateral Ultrasound Guidance: No   Fluoroscopic Guidance: No   Arthrogram: No Medications:  3 mL lidocaine 1 %; 3 mL bupivacaine 0.5 %; 40 mg methylPREDNISolone acetate 40 MG/ML     Clinical Data: No additional findings.   Subjective: Chief Complaint  Patient presents with  . Right Hip - Pain, Follow-up    10/24/15 R THA    The patient is 6 months status post right total hip replacement she denies any groin pain. She is having worsening pain on the lateral aspect of her hip. This does radiate down into the lateral aspect of the knee with associated numbness. She feels like the leg wants to give away. She is ambulating with a cane. This pain is not like her hip pain. Denies any  fevers or chills.    Review of Systems  Constitutional: Negative.   HENT: Negative.   Eyes: Negative.   Respiratory: Negative.   Cardiovascular: Negative.   Endocrine: Negative.   Musculoskeletal: Negative.   Neurological: Negative.   Hematological: Negative.   Psychiatric/Behavioral: Negative.   All other systems reviewed and are negative.    Objective: Vital Signs: There were no vitals taken for this visit.  Physical Exam  Constitutional: She is oriented to person, place, and time. She appears well-developed and well-nourished.  HENT:  Head: Atraumatic.  Eyes: EOM are normal.  Neck: Neck supple.  Cardiovascular: Intact distal pulses.   Pulmonary/Chest: Effort normal.  Abdominal: Soft.  Neurological: She is alert and oriented to person, place, and time.  Skin: Skin is warm. Capillary refill takes less than 2 seconds.  Psychiatric: She has a normal mood and affect. Her behavior is normal. Judgment and thought content normal.  Nursing note and vitals reviewed.   Right Hip Exam   Comments:  Well-healed surgical scar. She does have tenderness over the greater trochanter. There is no signs of infection. Range of motion of the hip is completely painless. Painless hip flexion and straight leg raise.      Specialty Comments:  No specialty comments available.  Imaging: Xr Pelvis 1-2 Views  Result Date: 05/03/2016 Stable total hip replacement.    PMFS History: Patient Active Problem List   Diagnosis Date Noted  . Trochanteric bursitis, right hip 05/03/2016  . Chronic kidney disease (CKD) stage G3a/A1, moderately decreased glomerular filtration rate (GFR) between 45-59 mL/min/1.73 square meter and albuminuria creatinine ratio less than 30 mg/g 04/07/2016  . Osteoarthritis of right hip 10/24/2015  . Hip joint replacement status 10/24/2015  . Chest pain 08/25/2015  . Right hip pain 08/25/2015  . Chest pain, precordial 08/10/2015  . Cardiomyopathy ([place]) 01/18/2015    . Block, bundle branch, left 01/18/2015  . Nausea alone 12/17/2013  . Diarrhea 11/19/2013  . Bloating 11/19/2013  . Obesity (BMI 30-39.9) 10/21/2013  . Multiple joint pain 11/13/2012  . MYALGIA 08/29/2010  . ABDOMINAL PAIN OTHER SPECIFIED SITE 08/22/2009  . OSTEOARTHROS UNSPEC WHETHER GEN/LOC UNSPEC SITE 03/17/2009  . Cellulitis and abscess of face 12/17/2008  . Disturbance of skin sensation 12/17/2008  . ACUTE BRONCHITIS 05/07/2008  . Anxiety 12/04/2007  . SINUSITIS, ACUTE NOS 12/19/2006  . Diabetes mellitus ([place]) 10/08/2006  . Hyperlipidemia 10/08/2006  . Essential hypertension 10/08/2006  . COPD 10/08/2006  . GERD 10/08/2006  . Other postprocedural status(V45.89) 10/08/2006   Past Medical History:  Diagnosis Date  . Anxiety   . Bilateral edema of lower extremity    Swelling in legs and ankles  . Congestive heart failure ([place])   . COPD (chronic obstructive pulmonary disease) ([place])   . Diabetes mellitus    type 2  . Gastric polyps   . GERD (gastroesophageal reflux disease)    chronic  . Hyperlipidemia   . Hypertension   . IBS (irritable bowel syndrome)   . LBBB (left bundle branch block)    Dr. Gwenith Spitz, [facility]  . Pneumonia    hx of  . PONV (postoperative nausea and vomiting)    Had N/V after having tubal ligation and D&C  . Rheumatoid arthritis ([place])   . Shingles     Family History  Problem Relation Age of Onset  . Lymphoma Mother   . Heart disease Mother     chf, cabg x6  . Hypertension Mother   . GER disease Mother   . Hiatal hernia Mother   . Gallstones Mother   . Cancer Sister     brain stage 4  . Heart disease Maternal Grandmother 49    MI  . Heart disease Maternal Grandfather   . Stroke Paternal Grandmother   . Hypertension Paternal Grandmother   . Stroke Paternal Grandfather   . Hypertension Paternal Grandfather   . Rheum arthritis Father   . Hypertension Daughter   . Colon cancer Neg Hx   . Esophageal cancer Neg Hx   .  Pancreatic cancer Neg Hx   . Liver disease Neg Hx   . Kidney disease Neg Hx     Past Surgical History:  Procedure Laterality Date  . CARDIAC CATHETERIZATION     No PCI  . CHOLECYSTECTOMY    . COLONOSCOPY  06/06/2005   normal   . DILATION AND CURETTAGE OF UTERUS    . ESOPHAGOGASTRODUODENOSCOPY  06/06/2005, 11/17/2013  . JOINT REPLACEMENT Right 10/24/2015   rt hip replacement  . KNEE ARTHROSCOPY Left   . TOTAL HIP ARTHROPLASTY Right 10/24/2015   Procedure: RIGHT TOTAL HIP ARTHROPLASTY ANTERIOR APPROACH;  Surgeon: Leandrew Koyanagi, MD;  Location: [place];  Service: Orthopedics;  Laterality: Right;  . TUBAL LIGATION    . VAGINAL HYSTERECTOMY  Social History   Occupational History  . [employer]   Social History Main Topics  . Smoking status: Former Smoker    Years: 33.00    Types: Cigarettes    Quit date: 06/25/2002  . Smokeless tobacco: Never Used  . Alcohol use Yes     Comment: rare  . Drug use: No  . Sexual activity: Not Currently    Partners: Male

## 2016-05-04 ENCOUNTER — Telehealth: Payer: Self-pay | Admitting: Family Medicine

## 2016-05-04 NOTE — Telephone Encounter (Addendum)
Relation to pt: self Call back number:[phone]   Reason for call:  Patient states at her last visit PCP discussed a natural way to resolve body cramping and she would like to discuss, patient would like to know who's treating her fibromyalgia, please advise

## 2016-05-04 NOTE — Telephone Encounter (Signed)
Rheum does not treat fibro--- normally need pt and maybe pain management --- he did give her neurontin

## 2016-05-04 NOTE — Telephone Encounter (Signed)
Please advise. I do not see any recommendation regarding this in last note, and she does not have fibromyalgia on Problem List.

## 2016-05-07 NOTE — Telephone Encounter (Signed)
Patient checking on the status of message below °

## 2016-05-07 NOTE — Telephone Encounter (Signed)
Pt notified of below. States she is tired of getting bounced around between specialists and "no one actually treating me." She declined to schedule appt w/ PCP. She is not sleeping well due to cramping in her hands and feet. She would like to know if PCP can remember the name of OTC supplement mentioned at last visit for cramping/body aches. She also wanted to inform PCP that she cannot afford nephrology referral right now because she has not met her deductible and they are expecting too much money up front and they will not offer her a payment plan. Please advise.

## 2016-05-07 NOTE — Telephone Encounter (Signed)
Rheum said in his note it may be fibromyalgia----- If she needs more treatment for it she would need to be seen Treatment is usually pt and pain management

## 2016-05-07 NOTE — Telephone Encounter (Signed)
Does she need OV with you? Do you want to refer? I don't see that has been formally dx w/ fibromyalgia. Please advise.

## 2016-05-07 NOTE — Telephone Encounter (Signed)
hylands for leg cramps--- [employer] has it

## 2016-05-08 NOTE — Telephone Encounter (Signed)
Called pt and left detailed message (per DPR) with information on supplement below.

## 2016-05-08 NOTE — Telephone Encounter (Signed)
noted 

## 2016-05-08 NOTE — Telephone Encounter (Addendum)
Pt returned call, she is very appreciative of PCP's suggestion for cramping. She also apologized profusely for being "irate" on the phone yesterday stating that she was in pain and was frustrated with all of the doctor's appts she has needed lately. She requested that I forward her apology to PCP as well. She states that will make an appt w/ Dr. Carollee Herter soon for follow-up and discussion of treatment options but declined to make appt at time of call.

## 2016-05-21 ENCOUNTER — Other Ambulatory Visit: Payer: Self-pay | Admitting: Family Medicine

## 2016-05-21 NOTE — Telephone Encounter (Signed)
Refill x1 

## 2016-05-21 NOTE — Telephone Encounter (Signed)
Received Rx Request for Tramadol 50mg  tab on 05/21/2016.  Last Rf: 03/20/2016 #60 0RF Last Ov: 04/06/2016 Next Ov: 10/05/2016 UDS: Contract signed on 03/13/2015; Sample given; Low risk. Next screen was scheduled 09/10/2015.  Rx printed and awaiting PCP approval/signature.

## 2016-05-22 MED ORDER — TRAMADOL HCL 50 MG PO TABS: 50.0000 mg | ORAL_TABLET | Freq: Four times a day (QID) | ORAL | 0 refills | Status: DC | PRN

## 2016-05-22 NOTE — Telephone Encounter (Signed)
Rx printed. Awaiting Provider signature prior to faxing. (Tramadol 50mg )

## 2016-05-24 NOTE — Telephone Encounter (Signed)
Rx faxed to pharmacy. LB

## 2016-05-25 ENCOUNTER — Other Ambulatory Visit: Payer: Self-pay

## 2016-05-25 DIAGNOSIS — J209 Acute bronchitis, unspecified: Secondary | ICD-10-CM

## 2016-05-25 MED ORDER — LEVOCETIRIZINE DIHYDROCHLORIDE 5 MG PO TABS: 5.0000 mg | ORAL_TABLET | Freq: Every evening | ORAL | 1 refills | Status: DC

## 2016-07-05 ENCOUNTER — Telehealth (INDEPENDENT_AMBULATORY_CARE_PROVIDER_SITE_OTHER): Payer: Self-pay | Admitting: Orthopaedic Surgery

## 2016-07-05 NOTE — Telephone Encounter (Signed)
Please advise 

## 2016-07-05 NOTE — Telephone Encounter (Signed)
Hard to say over the phone. Would recommend one week of scheduled NSAIDs and if not better should come into the office

## 2016-07-09 ENCOUNTER — Telehealth: Payer: Self-pay | Admitting: Family Medicine

## 2016-07-09 DIAGNOSIS — I447 Left bundle-branch block, unspecified: Secondary | ICD-10-CM | POA: Diagnosis not present

## 2016-07-09 DIAGNOSIS — I428 Other cardiomyopathies: Secondary | ICD-10-CM | POA: Diagnosis not present

## 2016-07-09 DIAGNOSIS — R609 Edema, unspecified: Secondary | ICD-10-CM | POA: Diagnosis not present

## 2016-07-09 NOTE — Telephone Encounter (Signed)
Pt called back she says that she is able to get in to her Cardiologist today at 1:15. She will be going to that appt.

## 2016-07-09 NOTE — Telephone Encounter (Signed)
Spoke with patient, per Dr. Etter Sjogren she wanted her to be seen in the office, I was unable to schedule appt with PCP patient did not want to see any one else in the office. Patient stated she would call her cardiologist to see if she could be seen with that office. Patient stated she would call back and let the office know what she was going to do.  Suggested going to ED if she can not get in with cardiologist.  [facility]

## 2016-07-09 NOTE — Telephone Encounter (Signed)
Called pt to let her know.

## 2016-07-09 NOTE — Telephone Encounter (Signed)
Patient called stating that she is having swelling and pain in both of her ankles. She would like to know what she can do for this. She also states that she has had a red rash around her ankles. Please advise  Phone: [phone] or [phone] (can leave a message)

## 2016-07-09 NOTE — Telephone Encounter (Signed)
Noted    PC 

## 2016-07-10 DIAGNOSIS — R609 Edema, unspecified: Secondary | ICD-10-CM | POA: Insufficient documentation

## 2016-07-27 ENCOUNTER — Ambulatory Visit: Payer: 59 | Admitting: Family Medicine

## 2016-07-28 ENCOUNTER — Other Ambulatory Visit: Payer: Self-pay | Admitting: Family Medicine

## 2016-07-30 ENCOUNTER — Telehealth: Payer: Self-pay | Admitting: Family Medicine

## 2016-07-30 ENCOUNTER — Encounter: Payer: Self-pay | Admitting: Family Medicine

## 2016-07-30 ENCOUNTER — Ambulatory Visit: Payer: 59 | Admitting: Family

## 2016-07-30 ENCOUNTER — Other Ambulatory Visit: Payer: Self-pay | Admitting: Family Medicine

## 2016-07-30 NOTE — Telephone Encounter (Signed)
Patient lvm 07/29/16 at 10:40pm cancelling today 11am appointment with PCP, charge or no charge

## 2016-07-30 NOTE — Telephone Encounter (Signed)
Dr. Etter Sjogren-  Please disregard message below, Melissa please advise

## 2016-07-30 NOTE — Telephone Encounter (Signed)
Yes please

## 2016-07-31 ENCOUNTER — Other Ambulatory Visit: Payer: Self-pay | Admitting: Family Medicine

## 2016-07-31 DIAGNOSIS — I10 Essential (primary) hypertension: Secondary | ICD-10-CM

## 2016-07-31 DIAGNOSIS — E1165 Type 2 diabetes mellitus with hyperglycemia: Secondary | ICD-10-CM

## 2016-07-31 DIAGNOSIS — E1151 Type 2 diabetes mellitus with diabetic peripheral angiopathy without gangrene: Secondary | ICD-10-CM

## 2016-07-31 DIAGNOSIS — IMO0002 Reserved for concepts with insufficient information to code with codable children: Secondary | ICD-10-CM

## 2016-07-31 DIAGNOSIS — E785 Hyperlipidemia, unspecified: Secondary | ICD-10-CM

## 2016-07-31 MED ORDER — FUROSEMIDE 20 MG PO TABS: 20.0000 mg | ORAL_TABLET | Freq: Every day | ORAL | 1 refills | Status: DC

## 2016-07-31 NOTE — Telephone Encounter (Signed)
Last seen 04/06/16  Last filled 05/22/16  #[id number]  Please advise  PC

## 2016-07-31 NOTE — Telephone Encounter (Signed)
Faxed hardcopy for Tramadol 50 mg  # 60 no refills  Faxed to [facility] in [place] at [phone] Received confirmation

## 2016-08-09 ENCOUNTER — Encounter (INDEPENDENT_AMBULATORY_CARE_PROVIDER_SITE_OTHER): Payer: Self-pay | Admitting: Orthopaedic Surgery

## 2016-08-09 ENCOUNTER — Ambulatory Visit (INDEPENDENT_AMBULATORY_CARE_PROVIDER_SITE_OTHER): Payer: Worker's Compensation | Admitting: Orthopaedic Surgery

## 2016-08-09 DIAGNOSIS — M1611 Unilateral primary osteoarthritis, right hip: Secondary | ICD-10-CM | POA: Diagnosis not present

## 2016-08-09 DIAGNOSIS — M25571 Pain in right ankle and joints of right foot: Secondary | ICD-10-CM | POA: Insufficient documentation

## 2016-08-09 MED ORDER — METHOCARBAMOL 500 MG PO TABS: 500.0000 mg | ORAL_TABLET | Freq: Four times a day (QID) | ORAL | 2 refills | Status: DC | PRN

## 2016-08-09 NOTE — Progress Notes (Signed)
Office Visit Note   Patient: Beth Shepard           Date of Birth: 04-06-1956           MRN: [id number] Visit Date: 08/09/2016              Requested by: Ann Held, DO [address] [place], [place] [address] PCP: Ann Held, DO   Assessment & Plan: Visit Diagnoses:  1. Primary osteoarthritis of right hip   2. Pain in right ankle and joints of right foot     Plan: She likely has a hip and back contusion. She also has an ankle sprain. I recommend continue conservative treatment and relative rest and over-the-counter medicines. Reassurances given.I will see her back in 2 months for her 1 year visit for her hip replacement with a low AP pelvis.  Follow-Up Instructions: Return in about 2 months (around 10/07/2016).   Orders:  No orders of the defined types were placed in this encounter.  Meds ordered this encounter  Medications  . methocarbamol (ROBAXIN) 500 MG tablet    Sig: Take 1 tablet (500 mg total) by mouth every 6 (six) hours as needed for muscle spasms.    Dispense:  30 tablet    Refill:  2      Procedures: No procedures performed   Clinical Data: No additional findings.   Subjective: Chief Complaint  Patient presents with  . Right Hip - Pain, Injury  . Left Hip - Pain, Injury  . Right Ankle - Pain, Injury  . Left Ankle - Injury, Pain    Patient comes in today for right hip and ankle pain status post fall this week. She states that she is able to ambulate but has some nonspecific pain.. She is doing better.    Review of Systems Complete review of systems negative except for history of present illness  Objective: Vital Signs: There were no vitals taken for this visit.  Physical Exam  Constitutional: She is oriented to person, place, and time. She appears well-developed and well-nourished.  Pulmonary/Chest: Effort normal.  Neurological: She is alert and oriented to person, place, and time.  Skin: Skin is warm.  Capillary refill takes less than 2 seconds.  Psychiatric: She has a normal mood and affect. Her behavior is normal. Judgment and thought content normal.  Nursing note and vitals reviewed.   Ortho Exam Exam of the right hip shows good range of motion of the hip without pain. Overall exam of the right hip is relatively benign. There are no worrisome features. Mainly just nonspecific pain that feels consistent with her overall chronic pain syndrome Exam of the right ankle shows no bruising or swelling. Again she has nonspecific discomfort with palpation about the ankle. She has good range of motion. Specialty Comments:  No specialty comments available.  Imaging: No results found.   PMFS History: Patient Active Problem List   Diagnosis Date Noted  . Pain in right ankle and joints of right foot 08/09/2016  . Trochanteric bursitis, right hip 05/03/2016  . Chronic kidney disease (CKD) stage G3a/A1, moderately decreased glomerular filtration rate (GFR) between 45-59 mL/min/1.73 square meter and albuminuria creatinine ratio less than 30 mg/g 04/07/2016  . Primary osteoarthritis of right hip 10/24/2015  . Hip joint replacement status 10/24/2015  . Chest pain 08/25/2015  . Right hip pain 08/25/2015  . Chest pain, precordial 08/10/2015  . Cardiomyopathy ([place]) 01/18/2015  . Block, bundle branch,  left 01/18/2015  . Nausea alone 12/17/2013  . Diarrhea 11/19/2013  . Bloating 11/19/2013  . Obesity (BMI 30-39.9) 10/21/2013  . Multiple joint pain 11/13/2012  . MYALGIA 08/29/2010  . ABDOMINAL PAIN OTHER SPECIFIED SITE 08/22/2009  . OSTEOARTHROS UNSPEC WHETHER GEN/LOC UNSPEC SITE 03/17/2009  . Cellulitis and abscess of face 12/17/2008  . Disturbance of skin sensation 12/17/2008  . ACUTE BRONCHITIS 05/07/2008  . Anxiety 12/04/2007  . SINUSITIS, ACUTE NOS 12/19/2006  . Diabetes mellitus ([place]) 10/08/2006  . Hyperlipidemia 10/08/2006  . Essential hypertension 10/08/2006  . COPD 10/08/2006  . GERD  10/08/2006  . Other postprocedural status(V45.89) 10/08/2006   Past Medical History:  Diagnosis Date  . Anxiety   . Bilateral edema of lower extremity    Swelling in legs and ankles  . Congestive heart failure ([place])   . COPD (chronic obstructive pulmonary disease) ([place])   . Diabetes mellitus    type 2  . Gastric polyps   . GERD (gastroesophageal reflux disease)    chronic  . Hyperlipidemia   . Hypertension   . IBS (irritable bowel syndrome)   . LBBB (left bundle branch block)    Dr. Gwenith Spitz, [facility]  . Pneumonia    hx of  . PONV (postoperative nausea and vomiting)    Had N/V after having tubal ligation and D&C  . Rheumatoid arthritis ([place])   . Shingles     Family History  Problem Relation Age of Onset  . Lymphoma Mother   . Heart disease Mother     chf, cabg x6  . Hypertension Mother   . GER disease Mother   . Hiatal hernia Mother   . Gallstones Mother   . Cancer Sister     brain stage 4  . Heart disease Maternal Grandmother 21    MI  . Heart disease Maternal Grandfather   . Stroke Paternal Grandmother   . Hypertension Paternal Grandmother   . Stroke Paternal Grandfather   . Hypertension Paternal Grandfather   . Rheum arthritis Father   . Hypertension Daughter   . Colon cancer Neg Hx   . Esophageal cancer Neg Hx   . Pancreatic cancer Neg Hx   . Liver disease Neg Hx   . Kidney disease Neg Hx     Past Surgical History:  Procedure Laterality Date  . CARDIAC CATHETERIZATION     No PCI  . CHOLECYSTECTOMY    . COLONOSCOPY  06/06/2005   normal   . DILATION AND CURETTAGE OF UTERUS    . ESOPHAGOGASTRODUODENOSCOPY  06/06/2005, 11/17/2013  . JOINT REPLACEMENT Right 10/24/2015   rt hip replacement  . KNEE ARTHROSCOPY Left   . TOTAL HIP ARTHROPLASTY Right 10/24/2015   Procedure: RIGHT TOTAL HIP ARTHROPLASTY ANTERIOR APPROACH;  Surgeon: Leandrew Koyanagi, MD;  Location: [facility];  Service: Orthopedics;  Laterality: Right;  . TUBAL LIGATION    . VAGINAL  HYSTERECTOMY     Social History   Occupational History  . [employer]   Social History Main Topics  . Smoking status: Former Smoker    Years: 33.00    Types: Cigarettes    Quit date: 06/25/2002  . Smokeless tobacco: Never Used  . Alcohol use Yes     Comment: rare  . Drug use: No  . Sexual activity: Not Currently    Partners: Male

## 2016-08-17 ENCOUNTER — Telehealth (INDEPENDENT_AMBULATORY_CARE_PROVIDER_SITE_OTHER): Payer: Self-pay | Admitting: Orthopaedic Surgery

## 2016-08-17 NOTE — Telephone Encounter (Signed)
Michael from [facility] called needing medical records sent to him about the patients last appointment on the 15th. CB # [phone]

## 2016-08-23 NOTE — Telephone Encounter (Signed)
Called Beth Shepard from [facility]. I am needing there fax number in order to fax the OV notes to him. LMOM to return the call to leave [place] a  Fax number.

## 2016-08-24 ENCOUNTER — Ambulatory Visit: Payer: 59 | Admitting: Family Medicine

## 2016-08-24 ENCOUNTER — Telehealth (INDEPENDENT_AMBULATORY_CARE_PROVIDER_SITE_OTHER): Payer: Self-pay | Admitting: Orthopaedic Surgery

## 2016-08-24 DIAGNOSIS — Z0289 Encounter for other administrative examinations: Secondary | ICD-10-CM

## 2016-08-24 NOTE — Telephone Encounter (Signed)
Loma Sousa with [facility] called left fax# to fax notes to him. The fax# is [phone]

## 2016-08-24 NOTE — Telephone Encounter (Signed)
Faxed notes to [phone]

## 2016-08-27 ENCOUNTER — Encounter: Payer: Self-pay | Admitting: Family Medicine

## 2016-08-28 ENCOUNTER — Ambulatory Visit (INDEPENDENT_AMBULATORY_CARE_PROVIDER_SITE_OTHER): Payer: 59 | Admitting: Family Medicine

## 2016-08-28 ENCOUNTER — Encounter: Payer: Self-pay | Admitting: Family Medicine

## 2016-08-28 VITALS — BP 108/60 | HR 79 | Temp 98.8°F | Resp 16 | Ht 63.0 in | Wt 209.2 lb

## 2016-08-28 DIAGNOSIS — E1140 Type 2 diabetes mellitus with diabetic neuropathy, unspecified: Secondary | ICD-10-CM

## 2016-08-28 DIAGNOSIS — M79662 Pain in left lower leg: Secondary | ICD-10-CM | POA: Diagnosis not present

## 2016-08-28 DIAGNOSIS — M79661 Pain in right lower leg: Secondary | ICD-10-CM

## 2016-08-28 DIAGNOSIS — R7989 Other specified abnormal findings of blood chemistry: Secondary | ICD-10-CM | POA: Diagnosis not present

## 2016-08-28 DIAGNOSIS — I10 Essential (primary) hypertension: Secondary | ICD-10-CM

## 2016-08-28 DIAGNOSIS — E785 Hyperlipidemia, unspecified: Secondary | ICD-10-CM

## 2016-08-28 DIAGNOSIS — L03119 Cellulitis of unspecified part of limb: Secondary | ICD-10-CM | POA: Insufficient documentation

## 2016-08-28 MED ORDER — CEPHALEXIN 500 MG PO CAPS: 500.0000 mg | ORAL_CAPSULE | Freq: Two times a day (BID) | ORAL | 0 refills | Status: DC

## 2016-08-28 NOTE — Progress Notes (Signed)
Patient ID: Beth Shepard, female   DOB: [birth year], 61 y.o.   MRN: [id number]     Subjective:  I acted as a Education administrator for Dr. Carollee Herter.  Guerry Bruin, [place]   Patient ID: Beth Shepard, female    DOB: 1955-11-23, 61 y.o.   MRN: [id number]  Chief Complaint  Patient presents with  . Edema    both feet are swollen and has been for weeks.    HPI  Patient is in today for edema.  Both feet have been swollen for weeks.  Was seen by cardiologist and he stated that it was not a heart problem, gave her furosemide.  He advised to see PCP because she may have cellulitis of her ankles.  She has pain from knee down on both legs.  Patient Care Team: Ann Held, DO as PCP - General Alfonso Patten, MD as Referring Physician (Cardiology)   Past Medical History:  Diagnosis Date  . Anxiety   . Bilateral edema of lower extremity    Swelling in legs and ankles  . Congestive heart failure ([place])   . COPD (chronic obstructive pulmonary disease) ([place])   . Diabetes mellitus    type 2  . Gastric polyps   . GERD (gastroesophageal reflux disease)    chronic  . Hyperlipidemia   . Hypertension   . IBS (irritable bowel syndrome)   . LBBB (left bundle branch block)    Dr. Gwenith Spitz, [facility]  . Pneumonia    hx of  . PONV (postoperative nausea and vomiting)    Had N/V after having tubal ligation and D&C  . Rheumatoid arthritis ([place])   . Shingles     Past Surgical History:  Procedure Laterality Date  . CARDIAC CATHETERIZATION     No PCI  . CHOLECYSTECTOMY    . COLONOSCOPY  06/06/2005   normal   . DILATION AND CURETTAGE OF UTERUS    . ESOPHAGOGASTRODUODENOSCOPY  06/06/2005, 11/17/2013  . JOINT REPLACEMENT Right 10/24/2015   rt hip replacement  . KNEE ARTHROSCOPY Left   . TOTAL HIP ARTHROPLASTY Right 10/24/2015   Procedure: RIGHT TOTAL HIP ARTHROPLASTY ANTERIOR APPROACH;  Surgeon: Leandrew Koyanagi, MD;  Location: [facility];  Service: Orthopedics;  Laterality: Right;  . TUBAL  LIGATION    . VAGINAL HYSTERECTOMY      Family History  Problem Relation Age of Onset  . Lymphoma Mother   . Heart disease Mother     chf, cabg x6  . Hypertension Mother   . GER disease Mother   . Hiatal hernia Mother   . Gallstones Mother   . Cancer Sister     brain stage 4  . Heart disease Maternal Grandmother 32    MI  . Heart disease Maternal Grandfather   . Stroke Paternal Grandmother   . Hypertension Paternal Grandmother   . Stroke Paternal Grandfather   . Hypertension Paternal Grandfather   . Rheum arthritis Father   . Hypertension Daughter   . Colon cancer Neg Hx   . Esophageal cancer Neg Hx   . Pancreatic cancer Neg Hx   . Liver disease Neg Hx   . Kidney disease Neg Hx     Social History   Social History  . Marital status: Widowed    Spouse name: N/A  . Number of children: 2  . Years of education: N/A   Occupational History  . [employer]   Social History Main Topics  . Smoking status:  Former Smoker    Years: 33.00    Types: Cigarettes    Quit date: 06/25/2002  . Smokeless tobacco: Never Used  . Alcohol use Yes     Comment: rare  . Drug use: No  . Sexual activity: Not Currently    Partners: Male   Other Topics Concern  . Not on file   Social History Narrative   Limited exercise per cardiology    Outpatient Medications Prior to Visit  Medication Sig Dispense Refill  . ALPRAZolam (XANAX) 0.25 MG tablet TAKE 1 AND 1/2 TABLETS BY MOUTH EVERY MORNING (Patient taking differently: Take 0.25 mg by mouth daily. TAKE 1 AND 1/2 TABLETS BY MOUTH EVERY MORNING) 90 tablet 1  . aspirin 81 MG tablet Take 81 mg by mouth daily.    . carvedilol (COREG) 12.5 MG tablet Take 1 tablet (12.5 mg total) by mouth 2 (two) times daily. 180 tablet 1  . furosemide (LASIX) 20 MG tablet Take 1 tablet (20 mg total) by mouth daily. Dr Ola Spurr 30 tablet 1  . gabapentin (NEURONTIN) 300 MG capsule Take 300 mg by mouth at bedtime.    Marland Kitchen glimepiride  (AMARYL) 2 MG tablet TAKE 1 TABLET (2 MG TOTAL) BY MOUTH DAILY BEFORE BREAKFAST. 90 tablet 2  . glucose blood (ONE TOUCH TEST STRIPS) test strip 1 each by Other route as needed. Reported on 11/02/2015    . Hyoscyamine Sulfate 0.375 MG TBCR Take 1 tablet (0.375 mg total) by mouth 2 (two) times daily. 60 tablet 1  . lisinopril (PRINIVIL,ZESTRIL) 20 MG tablet TAKE 1 TABLET (20 MG TOTAL) BY MOUTH DAILY. 90 tablet 0  . methocarbamol (ROBAXIN) 500 MG tablet Take 1 tablet (500 mg total) by mouth every 6 (six) hours as needed for muscle spasms. 30 tablet 2  . ondansetron (ZOFRAN) 4 MG tablet Take 1-2 tablets (4-8 mg total) by mouth every 8 (eight) hours as needed for nausea or vomiting. 40 tablet 0  . pantoprazole (PROTONIX) 20 MG tablet TAKE 1 TABLET (20 MG TOTAL) BY MOUTH DAILY. 90 tablet 3  . simvastatin (ZOCOR) 40 MG tablet TAKE 1 TABLET BY MOUTH IN THE EVENING- 90 tablet 3  . spironolactone (ALDACTONE) 25 MG tablet Take 1 tablet (25 mg total) by mouth every other day. Dr Ola Spurr    . traMADol (ULTRAM) 50 MG tablet TAKE 1 TO 2 TABLETS BY MOUTH EVERY 6 HOURS AS NEEDED 60 tablet 0  . antiseptic oral rinse (BIOTENE) LIQD 15 mLs by Mouth Rinse route every 4 (four) hours as needed for dry mouth.    Marland Kitchen azithromycin (ZITHROMAX Z-PAK) 250 MG tablet As directed 6 each 0  . docusate sodium (COLACE) 100 MG capsule Take 100 mg by mouth 2 (two) times daily.    Marland Kitchen HYDROcodone-acetaminophen (NORCO/VICODIN) 5-325 MG tablet Take 1-2 tablets by mouth every 4 (four) hours as needed for moderate pain. (Patient not taking: Reported on 08/09/2016) 120 tablet 0  . levocetirizine (XYZAL) 5 MG tablet Take 1 tablet (5 mg total) by mouth every evening. 90 tablet 1  . lisinopril (PRINIVIL,ZESTRIL) 20 MG tablet TAKE 1 TABLET (20 MG TOTAL) BY MOUTH DAILY. 90 tablet 0   No facility-administered medications prior to visit.     Allergies  Allergen Reactions  . Prednisone Palpitations and Other (See Comments)    Dose pack causes  vomiting; Causes Gerd  . Codeine     REACTION: vomiting  . Ibuprofen Other (See Comments)    REACTION: questionable. Pt states that she can  take this sometimes.    Review of Systems  Constitutional: Negative for fever and malaise/fatigue.  HENT: Negative for congestion.   Eyes: Negative for blurred vision.  Respiratory: Negative for cough and shortness of breath.   Cardiovascular: Positive for leg swelling. Negative for chest pain and palpitations.  Gastrointestinal: Negative for vomiting.  Musculoskeletal: Positive for joint pain and myalgias. Negative for back pain.  Skin: Negative for rash.       errythema low ext  Neurological: Negative for loss of consciousness and headaches.       Objective:    Physical Exam  Constitutional: She is oriented to person, place, and time. She appears well-developed and well-nourished. No distress.  HENT:  Head: Normocephalic and atraumatic.  Eyes: Conjunctivae are normal.  Neck: Normal range of motion. No thyromegaly present.  Cardiovascular: Normal rate and regular rhythm.   Pulmonary/Chest: Effort normal and breath sounds normal. She has no wheezes.  Abdominal: Soft. Bowel sounds are normal. There is no tenderness.  Musculoskeletal: Normal range of motion. She exhibits edema and tenderness. She exhibits no deformity.  + errythema b/l low ext + tr pitting edema B/l calf pain  Neurological: She is alert and oriented to person, place, and time.  Skin: Skin is warm and dry. No rash noted. She is not diaphoretic. There is erythema. No pallor.  Psychiatric: She has a normal mood and affect.  Nursing note and vitals reviewed.   BP 108/60 (BP Location: Right Arm, Cuff Size: Normal)   Pulse 79   Temp 98.8 F (37.1 C) (Oral)   Resp 16   Ht 5\' 3"  (1.6 m)   Wt 209 lb 3.2 oz (94.9 kg)   SpO2 97%   BMI 37.06 kg/m  Wt Readings from Last 3 Encounters:  08/28/16 209 lb 3.2 oz (94.9 kg)  04/06/16 204 lb (92.5 kg)  02/11/16 190 lb (86.2 kg)      Lab Results  Component Value Date   WBC 8.3 04/06/2016   HGB 10.9 (L) 04/06/2016   HCT 32.9 (L) 04/06/2016   PLT 317.0 04/06/2016   GLUCOSE 100 (H) 04/06/2016   CHOL 192 04/06/2016   TRIG 142.0 04/06/2016   HDL 48.20 04/06/2016   LDLDIRECT 114.0 08/25/2015   LDLCALC 115 (H) 04/06/2016   ALT 13 04/06/2016   AST 15 04/06/2016   NA 139 04/06/2016   K 4.4 04/06/2016   CL 103 04/06/2016   CREATININE 1.14 04/06/2016   BUN 21 04/06/2016   CO2 27 04/06/2016   TSH 0.890 10/21/2013   INR 0.99 10/14/2015   HGBA1C 6.3 04/06/2016   MICROALBUR 0.8 09/07/2014    Lab Results  Component Value Date   TSH 0.890 10/21/2013   Lab Results  Component Value Date   WBC 8.3 04/06/2016   HGB 10.9 (L) 04/06/2016   HCT 32.9 (L) 04/06/2016   MCV 84.7 04/06/2016   PLT 317.0 04/06/2016   Lab Results  Component Value Date   NA 139 04/06/2016   K 4.4 04/06/2016   CO2 27 04/06/2016   GLUCOSE 100 (H) 04/06/2016   BUN 21 04/06/2016   CREATININE 1.14 04/06/2016   BILITOT 0.4 04/06/2016   ALKPHOS 84 04/06/2016   AST 15 04/06/2016   ALT 13 04/06/2016   PROT 7.4 04/06/2016   ALBUMIN 4.3 04/06/2016   CALCIUM 9.8 04/06/2016   ANIONGAP 8 10/25/2015   GFR 51.21 (L) 04/06/2016   Lab Results  Component Value Date   CHOL 192 04/06/2016   Lab Results  Component Value Date   HDL 48.20 04/06/2016   Lab Results  Component Value Date   LDLCALC 115 (H) 04/06/2016   Lab Results  Component Value Date   TRIG 142.0 04/06/2016   Lab Results  Component Value Date   CHOLHDL 4 04/06/2016   Lab Results  Component Value Date   HGBA1C 6.3 04/06/2016       Assessment & Plan:   Problem List Items Addressed This Visit      Unprioritized   Diabetes mellitus ([place])   Relevant Orders   CBC with Differential/Platelet   Hemoglobin A1c   Cellulitis of lower extremity    Keflex x 10 days Dopplers Elevated legs F/u next week      Relevant Orders   Antinuclear Antib (ANA)    Sedimentation rate    Other Visit Diagnoses    Hyperlipidemia LDL goal <70    -  Primary   Relevant Orders   CBC with Differential/Platelet   Lipid panel   Hypertension, unspecified type       Relevant Orders   Comprehensive metabolic panel   Pain of left calf       Relevant Orders   US Venous Img Lower Bilateral   Pain of right calf       Relevant Orders   US Venous Img Lower Bilateral      I have discontinued Ms. Schriever's HYDROcodone-acetaminophen, docusate sodium, antiseptic oral rinse, azithromycin, and levocetirizine. I am also having her start on cephALEXin. Additionally, I am having her maintain her glucose blood, aspirin, Hyoscyamine Sulfate, ALPRAZolam, pantoprazole, gabapentin, ondansetron, spironolactone, simvastatin, glimepiride, carvedilol, traMADol, lisinopril, furosemide, and methocarbamol.  Meds ordered this encounter  Medications  . cephALEXin (KEFLEX) 500 MG capsule    Sig: Take 1 capsule (500 mg total) by mouth 2 (two) times daily.    Dispense:  20 capsule    Refill:  0    CMA served as scribe during this visit. History, Physical and Plan performed by medical provider. Documentation and orders reviewed and attested to.  Ann Held, DO

## 2016-08-28 NOTE — Patient Instructions (Signed)

## 2016-08-28 NOTE — Assessment & Plan Note (Signed)
Keflex x 10 days Dopplers Elevated legs F/u next week

## 2016-08-28 NOTE — Progress Notes (Signed)
Pre visit review using our clinic review tool, if applicable. No additional management support is needed unless otherwise documented below in the visit note. 

## 2016-08-29 ENCOUNTER — Ambulatory Visit (HOSPITAL_BASED_OUTPATIENT_CLINIC_OR_DEPARTMENT_OTHER)
Admission: RE | Admit: 2016-08-29 | Discharge: 2016-08-29 | Disposition: A | Discharge status: Home / Self Care | Payer: 59 | Source: Ambulatory Visit | Attending: Family Medicine | Admitting: Family Medicine

## 2016-08-29 DIAGNOSIS — R600 Localized edema: Secondary | ICD-10-CM | POA: Diagnosis not present

## 2016-08-29 DIAGNOSIS — M79662 Pain in left lower leg: Secondary | ICD-10-CM

## 2016-08-29 DIAGNOSIS — M79661 Pain in right lower leg: Secondary | ICD-10-CM | POA: Diagnosis not present

## 2016-08-29 LAB — HEMOGLOBIN A1C: Hgb A1c MFr Bld: 7.3 % — ABNORMAL HIGH (ref 4.6–6.5)

## 2016-08-29 LAB — LIPID PANEL
Cholesterol: 212 mg/dL — ABNORMAL HIGH (ref 0–200)
HDL: 43.8 mg/dL (ref >39.00)
NONHDL: 168.61
TRIGLYCERIDES: 234 mg/dL — AB (ref 0.0–149.0)
Total CHOL/HDL Ratio: 5
VLDL: 46.8 mg/dL — AB (ref 0.0–40.0)

## 2016-08-29 LAB — LDL CHOLESTEROL, DIRECT: LDL DIRECT: 110 mg/dL

## 2016-08-29 LAB — COMPREHENSIVE METABOLIC PANEL
ALT: 8 U/L (ref 0–35)
AST: 15 U/L (ref 0–37)
Albumin: 4.3 g/dL (ref 3.5–5.2)
Alkaline Phosphatase: 115 U/L (ref 39–117)
BUN: 22 mg/dL (ref 6–23)
CHLORIDE: 103 meq/L (ref 96–112)
CO2: 26 meq/L (ref 19–32)
Calcium: 9.7 mg/dL (ref 8.4–10.5)
Creatinine, Ser: 1.12 mg/dL (ref 0.40–1.20)
GFR: 52.2 mL/min — AB (ref >60.00)
Glucose, Bld: 76 mg/dL (ref 70–99)
POTASSIUM: 3.9 meq/L (ref 3.5–5.1)
Sodium: 138 mEq/L (ref 135–145)
Total Bilirubin: 0.2 mg/dL (ref 0.2–1.2)
Total Protein: 7.6 g/dL (ref 6.0–8.3)

## 2016-08-29 LAB — CBC WITH DIFFERENTIAL/PLATELET
BASOS PCT: 1.3 % (ref 0.0–3.0)
Basophils Absolute: 0.1 10*3/uL (ref 0.0–0.1)
EOS PCT: 2.7 % (ref 0.0–5.0)
Eosinophils Absolute: 0.3 10*3/uL (ref 0.0–0.7)
HCT: 34.1 % — ABNORMAL LOW (ref 36.0–46.0)
Hemoglobin: 11.2 g/dL — ABNORMAL LOW (ref 12.0–15.0)
LYMPHS ABS: 2.8 10*3/uL (ref 0.7–4.0)
Lymphocytes Relative: 26.4 % (ref 12.0–46.0)
MCHC: 32.9 g/dL (ref 30.0–36.0)
MCV: 83.8 fl (ref 78.0–100.0)
MONO ABS: 0.7 10*3/uL (ref 0.1–1.0)
Monocytes Relative: 7 % (ref 3.0–12.0)
NEUTROS ABS: 6.7 10*3/uL (ref 1.4–7.7)
NEUTROS PCT: 62.6 % (ref 43.0–77.0)
PLATELETS: 371 10*3/uL (ref 150.0–400.0)
RBC: 4.07 Mil/uL (ref 3.87–5.11)
RDW: 13.9 % (ref 11.5–15.5)
WBC: 10.7 10*3/uL — ABNORMAL HIGH (ref 4.0–10.5)

## 2016-08-29 LAB — SEDIMENTATION RATE: SED RATE: 17 mm/h (ref 0–30)

## 2016-08-30 ENCOUNTER — Telehealth: Payer: Self-pay | Admitting: Family Medicine

## 2016-08-30 ENCOUNTER — Encounter: Payer: Self-pay | Admitting: Family Medicine

## 2016-08-30 DIAGNOSIS — Z79891 Long term (current) use of opiate analgesic: Secondary | ICD-10-CM | POA: Diagnosis not present

## 2016-08-30 NOTE — Telephone Encounter (Signed)
Relation to ZZ:CKIC Call back number:[phone]   Reason for call:  Patient inquiring about [place] results, please advise

## 2016-08-30 NOTE — Telephone Encounter (Signed)
See result notes. 

## 2016-08-31 ENCOUNTER — Ambulatory Visit (INDEPENDENT_AMBULATORY_CARE_PROVIDER_SITE_OTHER): Payer: 59 | Admitting: Family Medicine

## 2016-08-31 VITALS — BP 91/62 | HR 78 | Temp 98.2°F | Resp 16 | Ht 63.0 in | Wt 206.0 lb

## 2016-08-31 DIAGNOSIS — M79662 Pain in left lower leg: Secondary | ICD-10-CM

## 2016-08-31 DIAGNOSIS — M79661 Pain in right lower leg: Secondary | ICD-10-CM

## 2016-08-31 DIAGNOSIS — L03119 Cellulitis of unspecified part of limb: Secondary | ICD-10-CM | POA: Diagnosis not present

## 2016-08-31 LAB — ANA: Anti Nuclear Antibody(ANA): NEGATIVE

## 2016-08-31 NOTE — Patient Instructions (Signed)
Cellulitis, Adult Cellulitis is a skin infection. The infected area is usually red and sore. This condition occurs most often in the arms and lower legs. It is very important to get treated for this condition. Follow these instructions at home:  Take over-the-counter and prescription medicines only as told by your doctor.  If you were prescribed an antibiotic medicine, take it as told by your doctor. Do not stop taking the antibiotic even if you start to feel better.  Drink enough fluid to keep your pee (urine) clear or pale yellow.  Do not touch or rub the infected area.  Raise (elevate) the infected area above the level of your heart while you are sitting or lying down.  Place warm or cold wet cloths (warm or cold compresses) on the infected area. Do this as told by your doctor.  Keep all follow-up visits as told by your doctor. This is important. These visits let your doctor make sure your infection is not getting worse. Contact a doctor if:  You have a fever.  Your symptoms do not get better after 1-2 days of treatment.  Your bone or joint under the infected area starts to hurt after the skin has healed.  Your infection comes back. This can happen in the same area or another area.  You have a swollen bump in the infected area.  You have new symptoms.  You feel ill and also have muscle aches and pains. Get help right away if:  Your symptoms get worse.  You feel very sleepy.  You throw up (vomit) or have watery poop (diarrhea) for a long time.  There are red streaks coming from the infected area.  Your red area gets larger.  Your red area turns darker. This information is not intended to replace advice given to you by your health care provider. Make sure you discuss any questions you have with your health care provider. Document Released: 11/28/2007 Document Revised: 11/17/2015 Document Reviewed: 04/20/2015 Elsevier Interactive Patient Education  2017 [employer]  [employer].  

## 2016-08-31 NOTE — Progress Notes (Signed)
Subjective:  I acted as a Education administrator for Dr. Royden Purl, LPN   Patient ID: Beth Shepard, female    DOB: 24-Oct-1955, 61 y.o.   MRN: [id number]  Chief Complaint  Patient presents with  . Cellulitis    follow up    HPI  Patient is in today for follow up cellulitis on both legs and ankles. Patient report today the right ankle and foot is more swollen than the left.  Patient Care Team: Ann Held, DO as PCP - General Alfonso Patten, MD as Referring Physician (Cardiology)   Past Medical History:  Diagnosis Date  . Anxiety   . Bilateral edema of lower extremity    Swelling in legs and ankles  . Congestive heart failure ([place])   . COPD (chronic obstructive pulmonary disease) ([place])   . Diabetes mellitus    type 2  . Gastric polyps   . GERD (gastroesophageal reflux disease)    chronic  . Hyperlipidemia   . Hypertension   . IBS (irritable bowel syndrome)   . LBBB (left bundle branch block)    Dr. Gwenith Spitz, [facility]  . Pneumonia    hx of  . PONV (postoperative nausea and vomiting)    Had N/V after having tubal ligation and D&C  . Rheumatoid arthritis ([place])   . Shingles     Past Surgical History:  Procedure Laterality Date  . CARDIAC CATHETERIZATION     No PCI  . CHOLECYSTECTOMY    . COLONOSCOPY  06/06/2005   normal   . DILATION AND CURETTAGE OF UTERUS    . ESOPHAGOGASTRODUODENOSCOPY  06/06/2005, 11/17/2013  . JOINT REPLACEMENT Right 10/24/2015   rt hip replacement  . KNEE ARTHROSCOPY Left   . TOTAL HIP ARTHROPLASTY Right 10/24/2015   Procedure: RIGHT TOTAL HIP ARTHROPLASTY ANTERIOR APPROACH;  Surgeon: Leandrew Koyanagi, MD;  Location: [place];  Service: Orthopedics;  Laterality: Right;  . TUBAL LIGATION    . VAGINAL HYSTERECTOMY      Family History  Problem Relation Age of Onset  . Lymphoma Mother   . Heart disease Mother     chf, cabg x6  . Hypertension Mother   . GER disease Mother   . Hiatal hernia Mother   . Gallstones Mother   .  Cancer Sister     brain stage 4  . Heart disease Maternal Grandmother 109    MI  . Heart disease Maternal Grandfather   . Stroke Paternal Grandmother   . Hypertension Paternal Grandmother   . Stroke Paternal Grandfather   . Hypertension Paternal Grandfather   . Rheum arthritis Father   . Hypertension Daughter   . Colon cancer Neg Hx   . Esophageal cancer Neg Hx   . Pancreatic cancer Neg Hx   . Liver disease Neg Hx   . Kidney disease Neg Hx     Social History   Social History  . Marital status: Widowed    Spouse name: N/A  . Number of children: 2  . Years of education: N/A   Occupational History  . [employer]   Social History Main Topics  . Smoking status: Former Smoker    Years: 33.00    Types: Cigarettes    Quit date: 06/25/2002  . Smokeless tobacco: Never Used  . Alcohol use Yes     Comment: rare  . Drug use: No  . Sexual activity: Not Currently    Partners: Male   Other Topics  Concern  . Not on file   Social History Narrative   Limited exercise per cardiology    Outpatient Medications Prior to Visit  Medication Sig Dispense Refill  . ALPRAZolam (XANAX) 0.25 MG tablet TAKE 1 AND 1/2 TABLETS BY MOUTH EVERY MORNING (Patient taking differently: Take 0.25 mg by mouth daily. TAKE 1 AND 1/2 TABLETS BY MOUTH EVERY MORNING) 90 tablet 1  . aspirin 81 MG tablet Take 81 mg by mouth daily.    . carvedilol (COREG) 12.5 MG tablet Take 1 tablet (12.5 mg total) by mouth 2 (two) times daily. 180 tablet 1  . cephALEXin (KEFLEX) 500 MG capsule Take 1 capsule (500 mg total) by mouth 2 (two) times daily. 20 capsule 0  . furosemide (LASIX) 20 MG tablet Take 1 tablet (20 mg total) by mouth daily. Dr Ola Spurr 30 tablet 1  . gabapentin (NEURONTIN) 300 MG capsule Take 300 mg by mouth at bedtime.    Marland Kitchen glimepiride (AMARYL) 2 MG tablet TAKE 1 TABLET (2 MG TOTAL) BY MOUTH DAILY BEFORE BREAKFAST. 90 tablet 2  . glucose blood (ONE TOUCH TEST STRIPS) test strip 1  each by Other route as needed. Reported on 11/02/2015    . Hyoscyamine Sulfate 0.375 MG TBCR Take 1 tablet (0.375 mg total) by mouth 2 (two) times daily. 60 tablet 1  . lisinopril (PRINIVIL,ZESTRIL) 20 MG tablet TAKE 1 TABLET (20 MG TOTAL) BY MOUTH DAILY. 90 tablet 0  . methocarbamol (ROBAXIN) 500 MG tablet Take 1 tablet (500 mg total) by mouth every 6 (six) hours as needed for muscle spasms. 30 tablet 2  . ondansetron (ZOFRAN) 4 MG tablet Take 1-2 tablets (4-8 mg total) by mouth every 8 (eight) hours as needed for nausea or vomiting. 40 tablet 0  . pantoprazole (PROTONIX) 20 MG tablet TAKE 1 TABLET (20 MG TOTAL) BY MOUTH DAILY. 90 tablet 3  . simvastatin (ZOCOR) 40 MG tablet TAKE 1 TABLET BY MOUTH IN THE EVENING- 90 tablet 3  . spironolactone (ALDACTONE) 25 MG tablet Take 1 tablet (25 mg total) by mouth every other day. Dr Ola Spurr    . traMADol (ULTRAM) 50 MG tablet TAKE 1 TO 2 TABLETS BY MOUTH EVERY 6 HOURS AS NEEDED 60 tablet 0   No facility-administered medications prior to visit.     Allergies  Allergen Reactions  . Prednisone Palpitations and Other (See Comments)    Dose pack causes vomiting; Causes Gerd  . Codeine     REACTION: vomiting  . Ibuprofen Other (See Comments)    REACTION: questionable. Pt states that she can take this sometimes.    Review of Systems  Constitutional: Negative for fever.  HENT: Negative for congestion.   Eyes: Negative for blurred vision.  Respiratory: Negative for cough.   Cardiovascular: Negative for chest pain and palpitations.  Gastrointestinal: Negative for vomiting.  Musculoskeletal: Negative for back pain.  Skin: Negative for rash.  Neurological: Negative for loss of consciousness and headaches.       Objective:    Physical Exam  Constitutional: She is oriented to person, place, and time. She appears well-developed and well-nourished.  HENT:  Head: Normocephalic and atraumatic.  Eyes: Conjunctivae and EOM are normal.  Neck: Normal  range of motion. Neck supple. No JVD present. Carotid bruit is not present. No thyromegaly present.  Cardiovascular: Normal rate, regular rhythm and normal heart sounds.   No murmur heard. Pulmonary/Chest: Effort normal and breath sounds normal. No respiratory distress. She has no wheezes. She has no rales.  She exhibits no tenderness.  Musculoskeletal: She exhibits edema and tenderness.       Right ankle: She exhibits swelling.       Left ankle: She exhibits swelling.  Neurological: She is alert and oriented to person, place, and time.  Psychiatric: She has a normal mood and affect.  Nursing note and vitals reviewed.   BP 91/62 (BP Location: Left Arm, Patient Position: Sitting, Cuff Size: Large)   Pulse 78   Temp 98.2 F (36.8 C) (Oral)   Resp 16   Ht 5\' 3"  (1.6 m)   Wt 206 lb (93.4 kg)   SpO2 100%   BMI 36.49 kg/m  Wt Readings from Last 3 Encounters:  08/31/16 206 lb (93.4 kg)  08/28/16 209 lb 3.2 oz (94.9 kg)  04/06/16 204 lb (92.5 kg)     Lab Results  Component Value Date   WBC 10.7 (H) 08/28/2016   HGB 11.2 (L) 08/28/2016   HCT 34.1 (L) 08/28/2016   PLT 371.0 08/28/2016   GLUCOSE 76 08/28/2016   CHOL 212 (H) 08/28/2016   TRIG 234.0 (H) 08/28/2016   HDL 43.80 08/28/2016   LDLDIRECT 110.0 08/28/2016   LDLCALC 115 (H) 04/06/2016   ALT 8 08/28/2016   AST 15 08/28/2016   NA 138 08/28/2016   K 3.9 08/28/2016   CL 103 08/28/2016   CREATININE 1.12 08/28/2016   BUN 22 08/28/2016   CO2 26 08/28/2016   TSH 0.890 10/21/2013   INR 0.99 10/14/2015   HGBA1C 7.3 (H) 08/28/2016   MICROALBUR 0.8 09/07/2014    Lab Results  Component Value Date   TSH 0.890 10/21/2013   Lab Results  Component Value Date   WBC 10.7 (H) 08/28/2016   HGB 11.2 (L) 08/28/2016   HCT 34.1 (L) 08/28/2016   MCV 83.8 08/28/2016   PLT 371.0 08/28/2016   Lab Results  Component Value Date   NA 138 08/28/2016   K 3.9 08/28/2016   CO2 26 08/28/2016   GLUCOSE 76 08/28/2016   BUN 22 08/28/2016     CREATININE 1.12 08/28/2016   BILITOT 0.2 08/28/2016   ALKPHOS 115 08/28/2016   AST 15 08/28/2016   ALT 8 08/28/2016   PROT 7.6 08/28/2016   ALBUMIN 4.3 08/28/2016   CALCIUM 9.7 08/28/2016   ANIONGAP 8 10/25/2015   GFR 52.20 (L) 08/28/2016   Lab Results  Component Value Date   CHOL 212 (H) 08/28/2016   Lab Results  Component Value Date   HDL 43.80 08/28/2016   Lab Results  Component Value Date   LDLCALC 115 (H) 04/06/2016   Lab Results  Component Value Date   TRIG 234.0 (H) 08/28/2016   Lab Results  Component Value Date   CHOLHDL 5 08/28/2016   Lab Results  Component Value Date   HGBA1C 7.3 (H) 08/28/2016       Assessment & Plan:   Problem List Items Addressed This Visit      Unprioritized   Cellulitis of lower extremity - Primary   Relevant Orders   Ambulatory referral to Vascular Surgery    Other Visit Diagnoses    Pain in both lower legs       Relevant Orders   Ambulatory referral to Vascular Surgery      I am having Ms. Speckman maintain her glucose blood, aspirin, Hyoscyamine Sulfate, ALPRAZolam, pantoprazole, gabapentin, ondansetron, spironolactone, simvastatin, glimepiride, carvedilol, traMADol, lisinopril, furosemide, methocarbamol, and cephALEXin.  No orders of the defined types were placed in this encounter.  CMA served as Education administrator during this visit. History, Physical and Plan performed by medical provider. Documentation and orders reviewed and attested to.  Ann Held, DO  Patient ID: Beth Shepard, female   DOB: 04-16-1956, 61 y.o.   MRN: [id number]

## 2016-08-31 NOTE — Progress Notes (Signed)
Pre visit review using our clinic review tool, if applicable. No additional management support is needed unless otherwise documented below in the visit note. 

## 2016-09-02 ENCOUNTER — Encounter: Payer: Self-pay | Admitting: Family Medicine

## 2016-09-03 ENCOUNTER — Other Ambulatory Visit: Payer: Self-pay | Admitting: Family Medicine

## 2016-09-03 DIAGNOSIS — E785 Hyperlipidemia, unspecified: Secondary | ICD-10-CM

## 2016-09-03 DIAGNOSIS — E119 Type 2 diabetes mellitus without complications: Secondary | ICD-10-CM

## 2016-09-03 MED ORDER — ATORVASTATIN CALCIUM 20 MG PO TABS: 20.0000 mg | ORAL_TABLET | Freq: Every day | ORAL | 3 refills | Status: DC

## 2016-09-06 ENCOUNTER — Other Ambulatory Visit: Payer: Self-pay | Admitting: Family Medicine

## 2016-09-06 MED ORDER — SITAGLIPTIN PHOSPHATE 100 MG PO TABS: 100.0000 mg | ORAL_TABLET | Freq: Every day | ORAL | 3 refills | Status: DC

## 2016-09-10 ENCOUNTER — Telehealth: Payer: Self-pay | Admitting: Family Medicine

## 2016-09-10 NOTE — Telephone Encounter (Signed)
Called back number is [phone].  Called left message to call back.

## 2016-09-10 NOTE — Telephone Encounter (Signed)
Relation to pt: self  Call back number: direct line work # [phone]    Reason for call:  Patient in need of clinical advice regarding swollen legs, patient states she has been seen 3x denied appointment, please advise

## 2016-09-11 NOTE — Telephone Encounter (Signed)
Called left message to call back 

## 2016-09-11 NOTE — Telephone Encounter (Signed)
Pt returned call. [phone], She says that she is on her way to work so it may be a little difficult to get her. She would like to know if she should take additional Lacets for the swelling?  She says that the swelling has been bad since Sunday. Robin when call back could you try pt a few times due to her being at work

## 2016-09-11 NOTE — Telephone Encounter (Signed)
Yes she can do that for 3-4 days

## 2016-09-11 NOTE — Telephone Encounter (Signed)
Pt notified of instructions and verbalized understanding. 

## 2016-09-11 NOTE — Telephone Encounter (Signed)
Patient returned call. States she continues to have painful swelling of bilateral lower extremities/feet since Sunday. The swelling does not improve with rest/elevation. She has completed her course of keflex for cellulitis, but notes she still has some redness of her legs, although it is improved from previous. She had negative LE [place] a few weeks ago. She was recently referred to vascular surgery, but her appointment with them is not until May. She would like to know if she should temporarily increase lasix, as this helped her swelling before. Please advise.

## 2016-09-11 NOTE — Telephone Encounter (Signed)
Yes thanks 

## 2016-09-11 NOTE — Telephone Encounter (Signed)
Thanks. She has 20 mg tablets. Should she increase to 40 mg for 3-4 days and follow-up in office if no improvement?

## 2016-09-18 ENCOUNTER — Other Ambulatory Visit: Payer: Self-pay | Admitting: Family Medicine

## 2016-09-18 DIAGNOSIS — E1151 Type 2 diabetes mellitus with diabetic peripheral angiopathy without gangrene: Secondary | ICD-10-CM

## 2016-09-18 DIAGNOSIS — E785 Hyperlipidemia, unspecified: Secondary | ICD-10-CM

## 2016-09-18 DIAGNOSIS — E1165 Type 2 diabetes mellitus with hyperglycemia: Principal | ICD-10-CM

## 2016-09-18 DIAGNOSIS — IMO0002 Reserved for concepts with insufficient information to code with codable children: Secondary | ICD-10-CM

## 2016-09-18 DIAGNOSIS — I10 Essential (primary) hypertension: Secondary | ICD-10-CM

## 2016-09-18 NOTE — Telephone Encounter (Signed)
Faxed hardcopy for Tramad[facility] [place]

## 2016-09-18 NOTE — Telephone Encounter (Signed)
Requesting:   Alprazolam and Tramadol Contract   08/28/2016 UDS  Low risk Last OV   08/31/2016--no future appt Last Refill  Alprazolam  #90 with 1 refill on 01/13/2016                   Tramadol   #60 with 0 refills on 07/31/2016  Please Advise

## 2016-09-20 ENCOUNTER — Telehealth: Payer: Self-pay | Admitting: Family Medicine

## 2016-09-20 NOTE — Telephone Encounter (Signed)
She stopped the glimepride, so she is only on Januvia.---only has taken about a week. Could this be causing low blood sugar (Tuesday it was 82), nausea, some vomiting and some diarrhea, and clammy feeling. Patient is feeling better today, she wondered if a virus or medication???? She has not taken Januvia today and really wondered if a stomach virus.  Blood sugar this am was 101. Overall is feeling bettter today and BS better now.   She is wearing the compression stockings and will continue Also will get her formulary for cholesterol

## 2016-09-20 NOTE — Telephone Encounter (Signed)
Called both work/cell number left a message to call back.

## 2016-09-20 NOTE — Telephone Encounter (Signed)
Hold off on [place] and con't to watch blood sugar--- if BS starts increasing we will change med

## 2016-09-20 NOTE — Telephone Encounter (Signed)
Please call pt back on [phone]. Til about 12pm.

## 2016-09-20 NOTE — Telephone Encounter (Signed)
Called the patient informed of PCP instructions. ?She agreed to do so. ?

## 2016-09-20 NOTE — Telephone Encounter (Signed)
Patient called with questions about her ankles and lower legs medication helps on weekends but when she is at work her legs are still swelling. Not as much as they were last week but a little better.   Medication changes with her Diabetes medication and her cholesterol medications She thinks the Diabetics medication is making her have stomach cramps she what to know if that is a side effect she did not medication since is on the way to work   Cholesterol medication is too high she cannot afford it she is looking for an alternate medication for that. atorvastatin (LIPITOR) 20 MG tablet  Try cell phone start then work if she does not answer.  [phone] cell  [phone] work

## 2016-09-20 NOTE — Telephone Encounter (Signed)
Which med does she thing is causing stomach cramps?  Januvia?  She can wear support socks/ hose to help with swelling when she is at work.  lipitor is generic-- so we will need her to get a copy of her formulary or she needs to call her ins co and find out what is covered.

## 2016-09-30 ENCOUNTER — Other Ambulatory Visit: Payer: Self-pay | Admitting: Family Medicine

## 2016-09-30 DIAGNOSIS — IMO0002 Reserved for concepts with insufficient information to code with codable children: Secondary | ICD-10-CM

## 2016-09-30 DIAGNOSIS — E785 Hyperlipidemia, unspecified: Secondary | ICD-10-CM

## 2016-09-30 DIAGNOSIS — E1165 Type 2 diabetes mellitus with hyperglycemia: Secondary | ICD-10-CM

## 2016-09-30 DIAGNOSIS — E1151 Type 2 diabetes mellitus with diabetic peripheral angiopathy without gangrene: Secondary | ICD-10-CM

## 2016-09-30 DIAGNOSIS — I10 Essential (primary) hypertension: Secondary | ICD-10-CM

## 2016-10-01 MED ORDER — ALOGLIPTIN BENZOATE 25 MG PO TABS: 1.0000 | ORAL_TABLET | Freq: Every day | ORAL | 3 refills | Status: DC

## 2016-10-01 NOTE — Telephone Encounter (Signed)
Ok to switch to generic nesina 25 mg 1 po qd #30  2 refills

## 2016-10-01 NOTE — Telephone Encounter (Signed)
Sent in new prescription and scheduled  Her an appt in May.

## 2016-10-01 NOTE — Telephone Encounter (Signed)
Januvia is too expensive. alteranative is aloqliptin?? Advise is ok to change or increase glimeperide to 2 per day?? [facility] [place]

## 2016-10-01 NOTE — Addendum Note (Signed)
Addended by: Sharon Seller B on: 10/01/2016 11:59 AM   Modules accepted: Orders

## 2016-10-08 ENCOUNTER — Ambulatory Visit (INDEPENDENT_AMBULATORY_CARE_PROVIDER_SITE_OTHER): Payer: 59 | Admitting: Orthopaedic Surgery

## 2016-10-16 ENCOUNTER — Telehealth: Payer: Self-pay | Admitting: Family Medicine

## 2016-10-16 DIAGNOSIS — E1165 Type 2 diabetes mellitus with hyperglycemia: Principal | ICD-10-CM

## 2016-10-16 DIAGNOSIS — I10 Essential (primary) hypertension: Secondary | ICD-10-CM

## 2016-10-16 DIAGNOSIS — IMO0002 Reserved for concepts with insufficient information to code with codable children: Secondary | ICD-10-CM

## 2016-10-16 DIAGNOSIS — E1151 Type 2 diabetes mellitus with diabetic peripheral angiopathy without gangrene: Secondary | ICD-10-CM

## 2016-10-16 DIAGNOSIS — E785 Hyperlipidemia, unspecified: Secondary | ICD-10-CM

## 2016-10-16 MED ORDER — ALPRAZOLAM 0.25 MG PO TABS: ORAL_TABLET | ORAL | 0 refills | Status: DC

## 2016-10-16 NOTE — Telephone Encounter (Signed)
Patient informed of refill

## 2016-10-16 NOTE — Telephone Encounter (Signed)
Received phone call from this patient this morning regarding her alprazolam prescription. Was somewhat confusing. Her complaint was this.  That in the past she has always received a #90 day supply (enough to last 3 months) at one time fill on alprazolam.  But due to instructions on script now the pharmacy last gave her only #45 .  She stated that normally when nothing is going on stressful in her life she may only need 1/2 per day, but over the past few months or so she definitely is taking the 1 and 1/2 daily, some days has even taken 2 per day.  So she just wants this to be fixed if possible to be written in a way that she can walk out of the pharmacy with enough for 3 months (is less expensive that way).   I did tell her once able to that she may really need to come in for an appointment to discuss?? Let me know.

## 2016-10-16 NOTE — Telephone Encounter (Signed)
Faxed [facility] to [facility] [place] Will not close note until this patient has been informed

## 2016-10-16 NOTE — Telephone Encounter (Signed)
Ok to do 135

## 2016-10-16 NOTE — Telephone Encounter (Signed)
Printed hardcopy/PCP signed and then will fax to pharmacy. Called the patient left message to call back.

## 2016-10-18 ENCOUNTER — Encounter: Payer: Self-pay | Admitting: Vascular Surgery

## 2016-10-24 ENCOUNTER — Other Ambulatory Visit: Payer: Self-pay | Admitting: Family Medicine

## 2016-10-24 DIAGNOSIS — I10 Essential (primary) hypertension: Secondary | ICD-10-CM

## 2016-10-24 DIAGNOSIS — E1165 Type 2 diabetes mellitus with hyperglycemia: Principal | ICD-10-CM

## 2016-10-24 DIAGNOSIS — E1151 Type 2 diabetes mellitus with diabetic peripheral angiopathy without gangrene: Secondary | ICD-10-CM

## 2016-10-24 DIAGNOSIS — IMO0002 Reserved for concepts with insufficient information to code with codable children: Secondary | ICD-10-CM

## 2016-10-24 DIAGNOSIS — E785 Hyperlipidemia, unspecified: Secondary | ICD-10-CM

## 2016-10-29 ENCOUNTER — Ambulatory Visit (INDEPENDENT_AMBULATORY_CARE_PROVIDER_SITE_OTHER): Payer: 59 | Admitting: Orthopaedic Surgery

## 2016-10-30 ENCOUNTER — Other Ambulatory Visit: Payer: Self-pay | Admitting: Vascular Surgery

## 2016-10-30 DIAGNOSIS — R609 Edema, unspecified: Secondary | ICD-10-CM

## 2016-10-30 DIAGNOSIS — M79604 Pain in right leg: Secondary | ICD-10-CM

## 2016-10-30 DIAGNOSIS — M79605 Pain in left leg: Secondary | ICD-10-CM

## 2016-10-30 DIAGNOSIS — L03119 Cellulitis of unspecified part of limb: Secondary | ICD-10-CM

## 2016-10-31 ENCOUNTER — Encounter (HOSPITAL_COMMUNITY): Payer: 59

## 2016-10-31 ENCOUNTER — Encounter: Payer: 59 | Admitting: Vascular Surgery

## 2016-11-02 ENCOUNTER — Ambulatory Visit (INDEPENDENT_AMBULATORY_CARE_PROVIDER_SITE_OTHER): Payer: 59 | Admitting: Orthopaedic Surgery

## 2016-11-02 ENCOUNTER — Other Ambulatory Visit: Payer: Self-pay | Admitting: Family Medicine

## 2016-11-16 ENCOUNTER — Ambulatory Visit (INDEPENDENT_AMBULATORY_CARE_PROVIDER_SITE_OTHER): Payer: 59 | Admitting: Family Medicine

## 2016-11-16 ENCOUNTER — Encounter: Payer: Self-pay | Admitting: Family Medicine

## 2016-11-16 VITALS — BP 100/58 | HR 86 | Temp 98.7°F | Resp 16 | Ht 63.0 in | Wt 210.0 lb

## 2016-11-16 DIAGNOSIS — IMO0002 Reserved for concepts with insufficient information to code with codable children: Secondary | ICD-10-CM

## 2016-11-16 DIAGNOSIS — E785 Hyperlipidemia, unspecified: Secondary | ICD-10-CM | POA: Diagnosis not present

## 2016-11-16 DIAGNOSIS — E1165 Type 2 diabetes mellitus with hyperglycemia: Secondary | ICD-10-CM | POA: Diagnosis not present

## 2016-11-16 DIAGNOSIS — E1151 Type 2 diabetes mellitus with diabetic peripheral angiopathy without gangrene: Secondary | ICD-10-CM | POA: Diagnosis not present

## 2016-11-16 LAB — HEMOGLOBIN A1C: HEMOGLOBIN A1C: 8.1 % — AB (ref 4.6–6.5)

## 2016-11-16 LAB — LIPID PANEL
Cholesterol: 220 mg/dL — ABNORMAL HIGH (ref 0–200)
HDL: 40 mg/dL (ref >39.00)
NonHDL: 180.23
TRIGLYCERIDES: 311 mg/dL — AB (ref 0.0–149.0)
Total CHOL/HDL Ratio: 6
VLDL: 62.2 mg/dL — ABNORMAL HIGH (ref 0.0–40.0)

## 2016-11-16 LAB — LDL CHOLESTEROL, DIRECT: Direct LDL: 112 mg/dL

## 2016-11-16 LAB — COMPREHENSIVE METABOLIC PANEL
ALBUMIN: 4.2 g/dL (ref 3.5–5.2)
ALT: 11 U/L (ref 0–35)
AST: 14 U/L (ref 0–37)
Alkaline Phosphatase: 85 U/L (ref 39–117)
BUN: 24 mg/dL — AB (ref 6–23)
CO2: 28 mEq/L (ref 19–32)
Calcium: 9.6 mg/dL (ref 8.4–10.5)
Chloride: 103 mEq/L (ref 96–112)
Creatinine, Ser: 1.19 mg/dL (ref 0.40–1.20)
GFR: 48.64 mL/min — ABNORMAL LOW (ref >60.00)
Glucose, Bld: 149 mg/dL — ABNORMAL HIGH (ref 70–99)
POTASSIUM: 4.9 meq/L (ref 3.5–5.1)
Sodium: 137 mEq/L (ref 135–145)
Total Bilirubin: 0.4 mg/dL (ref 0.2–1.2)
Total Protein: 7.3 g/dL (ref 6.0–8.3)

## 2016-11-16 NOTE — Progress Notes (Signed)
Subjective:  I acted as a Education administrator for Dr. Carollee Herter.  Guerry Bruin, [place]   Patient ID: Beth Shepard, female    DOB: 29-May-1956, 61 y.o.   MRN: [id number]  Chief Complaint  Patient presents with  . Diabetes  . Hypertension  . Hyperlipidemia    HPI  Patient is in today for follow up lab work for diabetes, cholesterol, and blood pressure.  Only check sugars if she has any issues.  This morning she checked sugar and it was 117. HYPERTENSION   Blood pressure range-not checking   Chest pain- no      Dyspnea- no Lightheadedness- no   Edema- no  Other side effects - no   Medication compliance: good Low salt diet- yes    DIABETES    Blood Sugar ranges-117 today--- only checks when she has problems  Polyuria- no New Visual problems- no  Hypoglycemic symptoms- no  Other side effects-no Medication compliance - good Last eye exam- due Foot exam- today   HYPERLIPIDEMIA  Medication compliance- good RUQ pain- no Muscle aches- no Other side effects-no   Patient Care Team: Ann Held, DO as PCP - General Cheek, Gayleen Orem, MD as Referring Physician (Cardiology)   Past Medical History:  Diagnosis Date  . Anxiety   . Bilateral edema of lower extremity    Swelling in legs and ankles  . Congestive heart failure ([place])   . COPD (chronic obstructive pulmonary disease) ([place])   . Diabetes mellitus    type 2  . Gastric polyps   . GERD (gastroesophageal reflux disease)    chronic  . Hyperlipidemia   . Hypertension   . IBS (irritable bowel syndrome)   . LBBB (left bundle branch block)    Dr. Gwenith Spitz, [facility]  . Pneumonia    hx of  . PONV (postoperative nausea and vomiting)    Had N/V after having tubal ligation and D&C  . Rheumatoid arthritis ([place])   . Shingles     Past Surgical History:  Procedure Laterality Date  . CARDIAC CATHETERIZATION     No PCI  . CHOLECYSTECTOMY    . COLONOSCOPY  06/06/2005   normal   . DILATION AND CURETTAGE  OF UTERUS    . ESOPHAGOGASTRODUODENOSCOPY  06/06/2005, 11/17/2013  . JOINT REPLACEMENT Right 10/24/2015   rt hip replacement  . KNEE ARTHROSCOPY Left   . TOTAL HIP ARTHROPLASTY Right 10/24/2015   Procedure: RIGHT TOTAL HIP ARTHROPLASTY ANTERIOR APPROACH;  Surgeon: Leandrew Koyanagi, MD;  Location: [place];  Service: Orthopedics;  Laterality: Right;  . TUBAL LIGATION    . VAGINAL HYSTERECTOMY      Family History  Problem Relation Age of Onset  . Lymphoma Mother   . Heart disease Mother        chf, cabg x6  . Hypertension Mother   . GER disease Mother   . Hiatal hernia Mother   . Gallstones Mother   . Cancer Sister        brain stage 4  . Heart disease Maternal Grandmother 54       MI  . Heart disease Maternal Grandfather   . Stroke Paternal Grandmother   . Hypertension Paternal Grandmother   . Stroke Paternal Grandfather   . Hypertension Paternal Grandfather   . Rheum arthritis Father   . Hypertension Daughter   . Colon cancer Neg Hx   . Esophageal cancer Neg Hx   . Pancreatic cancer Neg Hx   .  Liver disease Neg Hx   . Kidney disease Neg Hx     Social History   Social History  . Marital status: Widowed    Spouse name: N/A  . Number of children: 2  . Years of education: N/A   Occupational History  . [employer]   Social History Main Topics  . Smoking status: Former Smoker    Years: 33.00    Types: Cigarettes    Quit date: 06/25/2002  . Smokeless tobacco: Never Used  . Alcohol use Yes     Comment: rare  . Drug use: No  . Sexual activity: Not Currently    Partners: Male   Other Topics Concern  . Not on file   Social History Narrative   Limited exercise per cardiology    Outpatient Medications Prior to Visit  Medication Sig Dispense Refill  . Alogliptin Benzoate (NESINA) 25 MG TABS Take 1 tablet by mouth daily. 30 tablet 3  . ALPRAZolam (XANAX) 0.25 MG tablet Take 1 and 1/2 tablets by mouth every morning 135 tablet 0  . aspirin 81 MG  tablet Take 81 mg by mouth daily.    Marland Kitchen atorvastatin (LIPITOR) 20 MG tablet Take 1 tablet (20 mg total) by mouth daily. 30 tablet 3  . carvedilol (COREG) 12.5 MG tablet Take 1 tablet (12.5 mg total) by mouth 2 (two) times daily. 180 tablet 1  . furosemide (LASIX) 20 MG tablet TAKE 1 TABLET (20 MG TOTAL) BY MOUTH DAILY. DR FITZGERALD 30 tablet 1  . gabapentin (NEURONTIN) 300 MG capsule Take 300 mg by mouth at bedtime.    Marland Kitchen glimepiride (AMARYL) 2 MG tablet TAKE 1 TABLET (2 MG TOTAL) BY MOUTH DAILY BEFORE BREAKFAST. (Patient not taking: Reported on 11/16/2016) 90 tablet 2  . glucose blood (ONE TOUCH TEST STRIPS) test strip 1 each by Other route as needed. Reported on 11/02/2015    . Hyoscyamine Sulfate 0.375 MG TBCR Take 1 tablet (0.375 mg total) by mouth 2 (two) times daily. 60 tablet 1  . lisinopril (PRINIVIL,ZESTRIL) 20 MG tablet TAKE 1 TABLET (20 MG TOTAL) BY MOUTH DAILY. 90 tablet 0  . methocarbamol (ROBAXIN) 500 MG tablet Take 1 tablet (500 mg total) by mouth every 6 (six) hours as needed for muscle spasms. 30 tablet 2  . ondansetron (ZOFRAN) 4 MG tablet Take 1-2 tablets (4-8 mg total) by mouth every 8 (eight) hours as needed for nausea or vomiting. 40 tablet 0  . pantoprazole (PROTONIX) 20 MG tablet TAKE 1 TABLET (20 MG TOTAL) BY MOUTH DAILY. 90 tablet 3  . spironolactone (ALDACTONE) 25 MG tablet Take 1 tablet (25 mg total) by mouth every other day. Dr Ola Spurr    . traMADol (ULTRAM) 50 MG tablet TAKE 1 TO 2 TABLETS BY MOUTH EVERY 6 HOURS AS NEEDED 60 tablet 0  . cephALEXin (KEFLEX) 500 MG capsule Take 1 capsule (500 mg total) by mouth 2 (two) times daily. 20 capsule 0  . glimepiride (AMARYL) 2 MG tablet TAKE 1 TABLET (2 MG TOTAL) BY MOUTH DAILY BEFORE BREAKFAST. 90 tablet 2   No facility-administered medications prior to visit.     Allergies  Allergen Reactions  . Prednisone Palpitations and Other (See Comments)    Dose pack causes vomiting; Causes Gerd  . Codeine     REACTION: vomiting    . Ibuprofen Other (See Comments)    REACTION: questionable. Pt states that she can take this sometimes.    Review of Systems  Constitutional: Negative for fever and malaise/fatigue.  HENT: Negative for congestion.   Eyes: Negative for blurred vision.  Respiratory: Negative for cough and shortness of breath.   Cardiovascular: Negative for chest pain, palpitations and leg swelling.  Gastrointestinal: Negative for abdominal pain, blood in stool, nausea and vomiting.  Genitourinary: Negative for dysuria and frequency.  Musculoskeletal: Negative for back pain and falls.  Skin: Negative for rash.  Neurological: Negative for dizziness, loss of consciousness and headaches.  Endo/Heme/Allergies: Negative for environmental allergies.  Psychiatric/Behavioral: Negative for depression. The patient is not nervous/anxious.        Objective:    Physical Exam  Constitutional: She is oriented to person, place, and time. She appears well-developed and well-nourished. No distress.  HENT:  Head: Normocephalic and atraumatic.  Eyes: Conjunctivae and EOM are normal.  Neck: Normal range of motion. Neck supple. No JVD present. Carotid bruit is not present. No thyromegaly present.  Cardiovascular: Normal rate, regular rhythm and normal heart sounds.   No murmur heard. Pulmonary/Chest: Effort normal and breath sounds normal. No respiratory distress. She has no wheezes. She has no rales. She exhibits no tenderness.  Abdominal: Soft. Bowel sounds are normal. There is no tenderness.  Musculoskeletal: Normal range of motion. She exhibits no edema or deformity.  Neurological: She is alert and oriented to person, place, and time.  Skin: Skin is warm and dry. She is not diaphoretic.  Psychiatric: She has a normal mood and affect. Her behavior is normal. Judgment and thought content normal.  Nursing note and vitals reviewed.   BP (!) 100/58 (BP Location: Left Arm, Cuff Size: Large)   Pulse 86   Temp 98.7 F  (37.1 C) (Oral)   Resp 16   Ht 5\' 3"  (1.6 m)   Wt 210 lb (95.3 kg)   SpO2 96%   BMI 37.20 kg/m  Wt Readings from Last 3 Encounters:  11/16/16 210 lb (95.3 kg)  08/31/16 206 lb (93.4 kg)  08/28/16 209 lb 3.2 oz (94.9 kg)   BP Readings from Last 3 Encounters:  11/16/16 (!) 100/58  08/31/16 91/62  08/28/16 108/60     Immunization History  Administered Date(s) Administered  . Influenza Whole 03/17/2009  . Influenza,inj,Quad PF,36+ Mos 09/07/2014  . PPD Test 10/26/2015  . Pneumococcal Polysaccharide-23 11/13/2012  . Tdap 11/13/2012    Health Maintenance  Topic Date Due  . HIV Screening  07/18/1968  . MAMMOGRAM  07/19/1971  . COLONOSCOPY  06/07/2015  . OPHTHALMOLOGY EXAM  09/07/2015  . INFLUENZA VACCINE  01/23/2017  . HEMOGLOBIN A1C  05/19/2017  . PNEUMOCOCCAL POLYSACCHARIDE VACCINE (2) 11/13/2017  . TETANUS/TDAP  11/14/2022  . Hepatitis C Screening  Completed    Lab Results  Component Value Date   WBC 10.7 (H) 08/28/2016   HGB 11.2 (L) 08/28/2016   HCT 34.1 (L) 08/28/2016   PLT 371.0 08/28/2016   GLUCOSE 149 (H) 11/16/2016   CHOL 220 (H) 11/16/2016   TRIG 311.0 (H) 11/16/2016   HDL 40.00 11/16/2016   LDLDIRECT 112.0 11/16/2016   LDLCALC 115 (H) 04/06/2016   ALT 11 11/16/2016   AST 14 11/16/2016   NA 137 11/16/2016   K 4.9 11/16/2016   CL 103 11/16/2016   CREATININE 1.19 11/16/2016   BUN 24 (H) 11/16/2016   CO2 28 11/16/2016   TSH 0.890 10/21/2013   INR 0.99 10/14/2015   HGBA1C 8.1 (H) 11/16/2016   MICROALBUR 0.8 09/07/2014    Lab Results  Component Value Date   TSH  0.890 10/21/2013   Lab Results  Component Value Date   WBC 10.7 (H) 08/28/2016   HGB 11.2 (L) 08/28/2016   HCT 34.1 (L) 08/28/2016   MCV 83.8 08/28/2016   PLT 371.0 08/28/2016   Lab Results  Component Value Date   NA 137 11/16/2016   K 4.9 11/16/2016   CO2 28 11/16/2016   GLUCOSE 149 (H) 11/16/2016   BUN 24 (H) 11/16/2016   CREATININE 1.19 11/16/2016   BILITOT 0.4 11/16/2016     ALKPHOS 85 11/16/2016   AST 14 11/16/2016   ALT 11 11/16/2016   PROT 7.3 11/16/2016   ALBUMIN 4.2 11/16/2016   CALCIUM 9.6 11/16/2016   ANIONGAP 8 10/25/2015   GFR 48.64 (L) 11/16/2016   Lab Results  Component Value Date   CHOL 220 (H) 11/16/2016   Lab Results  Component Value Date   HDL 40.00 11/16/2016   Lab Results  Component Value Date   LDLCALC 115 (H) 04/06/2016   Lab Results  Component Value Date   TRIG 311.0 (H) 11/16/2016   Lab Results  Component Value Date   CHOLHDL 6 11/16/2016   Lab Results  Component Value Date   HGBA1C 8.1 (H) 11/16/2016         Assessment & Plan:   Problem List Items Addressed This Visit      Unprioritized   DM (diabetes mellitus) type II uncontrolled, periph vascular disorder ([place]) - Primary    hgba1c to be checked, minimize simple carbs. Increase exercise as tolerated. Continue current meds       Relevant Orders   Comprehensive metabolic panel (Completed)   Lipid panel (Completed)   Hemoglobin A1c (Completed)   Hyperlipidemia LDL goal <70    Tolerating statin, encouraged heart healthy diet, avoid trans fats, minimize simple carbs and saturated fats. Increase exercise as tolerated      Relevant Orders   Comprehensive metabolic panel (Completed)   Lipid panel (Completed)   Hemoglobin A1c (Completed)      I have discontinued Ms. Tormey's cephALEXin. I am also having her maintain her glucose blood, aspirin, Hyoscyamine Sulfate, pantoprazole, gabapentin, ondansetron, spironolactone, glimepiride, carvedilol, methocarbamol, atorvastatin, traMADol, furosemide, Alogliptin Benzoate, ALPRAZolam, and lisinopril.  No orders of the defined types were placed in this encounter.   CMA served as Education administrator during this visit. History, Physical and Plan performed by medical provider. Documentation and orders reviewed and attested to.  Ann Held, DO

## 2016-11-16 NOTE — Patient Instructions (Signed)
Carbohydrate Counting for Diabetes Mellitus, Adult Carbohydrate counting is a method for keeping track of how many carbohydrates you eat. Eating carbohydrates naturally increases the amount of sugar (glucose) in the blood. Counting how many carbohydrates you eat helps keep your blood glucose within normal limits, which helps you manage your diabetes (diabetes mellitus). It is important to know how many carbohydrates you can safely have in each meal. This is different for every person. A diet and nutrition specialist (registered dietitian) can help you make a meal plan and calculate how many carbohydrates you should have at each meal and snack. Carbohydrates are found in the following foods:  Grains, such as breads and cereals.  Dried beans and soy products.  Starchy vegetables, such as potatoes, peas, and corn.  Fruit and fruit juices.  Milk and yogurt.  Sweets and snack foods, such as cake, cookies, candy, chips, and soft drinks. How do I count carbohydrates? There are two ways to count carbohydrates in food. You can use either of the methods or a combination of both. Reading "Nutrition Facts" on packaged food  The "Nutrition Facts" list is included on the labels of almost all packaged foods and beverages in the [place] It includes:  The serving size.  Information about nutrients in each serving, including the grams (g) of carbohydrate per serving. To use the "Nutrition Facts":  Decide how many servings you will have.  Multiply the number of servings by the number of carbohydrates per serving.  The resulting number is the total amount of carbohydrates that you will be having. Learning standard serving sizes of other foods  When you eat foods containing carbohydrates that are not packaged or do not include "Nutrition Facts" on the label, you need to measure the servings in order to count the amount of carbohydrates:  Measure the foods that you will eat with a food scale or measuring  cup, if needed.  Decide how many standard-size servings you will eat.  Multiply the number of servings by 15. Most carbohydrate-rich foods have about 15 g of carbohydrates per serving.  For example, if you eat 8 oz (170 g) of strawberries, you will have eaten 2 servings and 30 g of carbohydrates (2 servings x 15 g = 30 g).  For foods that have more than one food mixed, such as soups and casseroles, you must count the carbohydrates in each food that is included. The following list contains standard serving sizes of common carbohydrate-rich foods. Each of these servings has about 15 g of carbohydrates:   hamburger bun or  English muffin.   oz (15 mL) syrup.   oz (14 g) jelly.  1 slice of bread.  1 six-inch tortilla.  3 oz (85 g) cooked rice or pasta.  4 oz (113 g) cooked dried beans.  4 oz (113 g) starchy vegetable, such as peas, corn, or potatoes.  4 oz (113 g) hot cereal.  4 oz (113 g) mashed potatoes or  of a large baked potato.  4 oz (113 g) canned or frozen fruit.  4 oz (120 mL) fruit juice.  4-6 crackers.  6 chicken nuggets.  6 oz (170 g) unsweetened dry cereal.  6 oz (170 g) plain fat-free yogurt or yogurt sweetened with artificial sweeteners.  8 oz (240 mL) milk.  8 oz (170 g) fresh fruit or one small piece of fruit.  24 oz (680 g) popped popcorn. Example of carbohydrate counting Sample meal  3 oz (85 g) chicken breast.  6 oz (  170 g) brown rice.  4 oz (113 g) corn.  8 oz (240 mL) milk.  8 oz (170 g) strawberries with sugar-free whipped topping. Carbohydrate calculation 1. Identify the foods that contain carbohydrates:  Rice.  Corn.  Milk.  Strawberries. 2. Calculate how many servings you have of each food:  2 servings rice.  1 serving corn.  1 serving milk.  1 serving strawberries. 3. Multiply each number of servings by 15 g:  2 servings rice x 15 g = 30 g.  1 serving corn x 15 g = 15 g.  1 serving milk x 15 g = 15  g.  1 serving strawberries x 15 g = 15 g. 4. Add together all of the amounts to find the total grams of carbohydrates eaten:  30 g + 15 g + 15 g + 15 g = 75 g of carbohydrates total. This information is not intended to replace advice given to you by your health care provider. Make sure you discuss any questions you have with your health care provider. Document Released: 06/11/2005 Document Revised: 12/30/2015 Document Reviewed: 11/23/2015 Elsevier Interactive Patient Education  2017 Elsevier Inc.  

## 2016-11-17 NOTE — Assessment & Plan Note (Signed)
Tolerating statin, encouraged heart healthy diet, avoid trans fats, minimize simple carbs and saturated fats. Increase exercise as tolerated 

## 2016-11-17 NOTE — Assessment & Plan Note (Signed)
hgba1c to be checked, minimize simple carbs. Increase exercise as tolerated. Continue current meds  

## 2016-11-20 ENCOUNTER — Other Ambulatory Visit: Payer: Self-pay

## 2016-11-20 DIAGNOSIS — E119 Type 2 diabetes mellitus without complications: Secondary | ICD-10-CM

## 2016-11-20 DIAGNOSIS — E785 Hyperlipidemia, unspecified: Secondary | ICD-10-CM

## 2016-11-20 DIAGNOSIS — I10 Essential (primary) hypertension: Secondary | ICD-10-CM

## 2016-11-20 MED ORDER — FENOFIBRATE 160 MG PO TABS: 160.0000 mg | ORAL_TABLET | Freq: Every day | ORAL | 2 refills | Status: DC

## 2016-11-22 ENCOUNTER — Telehealth: Payer: Self-pay | Admitting: Family Medicine

## 2016-11-22 NOTE — Telephone Encounter (Signed)
Called left message to call back 

## 2016-11-22 NOTE — Telephone Encounter (Signed)
Pt called says spoke to robin couple of days ago regarding new cholesterol script at [facility]. Pt states couple of months ago [employer] prescribed a different cholesterol medication. Pt needs to know if she should take both or discontinue the previous cholesterol med/ pt is waiting for confirmation before picking up new script. Call pt cell [phone] she will check messages.

## 2016-11-23 NOTE — Telephone Encounter (Signed)
Patient returning you call 2-3 is her lunch time if you want to call or you can call her cell [phone]

## 2016-11-23 NOTE — Telephone Encounter (Signed)
Patient informed. 

## 2016-11-23 NOTE — Telephone Encounter (Signed)
Ok to take both 

## 2016-11-23 NOTE — Telephone Encounter (Signed)
Spoke to the patient and she is taking atorvastatin and has added due to recent lab results fenofibrate. She needs confirmation from PCP ok to take both/or too much

## 2016-11-29 ENCOUNTER — Encounter: Payer: Self-pay | Admitting: Vascular Surgery

## 2016-12-05 ENCOUNTER — Other Ambulatory Visit: Payer: Self-pay | Admitting: Family Medicine

## 2016-12-06 NOTE — Telephone Encounter (Signed)
Faxed hardcopy for tramadol to [facility] in [place]

## 2016-12-06 NOTE — Telephone Encounter (Signed)
Requesting:   tramadol Contract   08/28/2016 UDS   Low risk next is due on 02/28/17 Last OV    11/16/16 Last Refill    #[phone] no refills on 09/18/16  Please Advise

## 2016-12-07 ENCOUNTER — Encounter (HOSPITAL_COMMUNITY): Payer: 59

## 2016-12-07 ENCOUNTER — Encounter: Payer: 59 | Admitting: Vascular Surgery

## 2016-12-10 ENCOUNTER — Other Ambulatory Visit: Payer: Self-pay | Admitting: Family Medicine

## 2016-12-10 DIAGNOSIS — I10 Essential (primary) hypertension: Secondary | ICD-10-CM

## 2016-12-10 DIAGNOSIS — E1151 Type 2 diabetes mellitus with diabetic peripheral angiopathy without gangrene: Secondary | ICD-10-CM

## 2016-12-10 DIAGNOSIS — E785 Hyperlipidemia, unspecified: Secondary | ICD-10-CM

## 2016-12-10 DIAGNOSIS — E1165 Type 2 diabetes mellitus with hyperglycemia: Secondary | ICD-10-CM

## 2016-12-10 DIAGNOSIS — IMO0002 Reserved for concepts with insufficient information to code with codable children: Secondary | ICD-10-CM

## 2016-12-18 ENCOUNTER — Other Ambulatory Visit: Payer: Self-pay | Admitting: *Deleted

## 2016-12-18 MED ORDER — ALOGLIPTIN BENZOATE 25 MG PO TABS: 1.0000 | ORAL_TABLET | Freq: Every day | ORAL | 0 refills | Status: DC

## 2016-12-19 ENCOUNTER — Telehealth: Payer: Self-pay | Admitting: Family Medicine

## 2016-12-19 NOTE — Telephone Encounter (Addendum)
Caller name:Berania Vento Relationship to patient: Can be reached:[phone] or [phone] Pharmacy:  Reason for call:requesting status on refill of alogliptin. From now on patient will need 90 day supply for all meds, insurance is requesting it be done that way and its cheaper for patient. Would like to speak with someone regarding this.

## 2016-12-19 NOTE — Telephone Encounter (Signed)
rx sent in 

## 2016-12-19 NOTE — Telephone Encounter (Signed)
Patient notified

## 2016-12-24 ENCOUNTER — Encounter (HOSPITAL_COMMUNITY): Payer: 59

## 2016-12-24 ENCOUNTER — Encounter: Payer: 59 | Admitting: Surgery

## 2017-01-12 ENCOUNTER — Other Ambulatory Visit: Payer: Self-pay | Admitting: Family Medicine

## 2017-01-12 DIAGNOSIS — R110 Nausea: Secondary | ICD-10-CM

## 2017-01-13 ENCOUNTER — Other Ambulatory Visit: Payer: Self-pay | Admitting: Family Medicine

## 2017-01-14 ENCOUNTER — Other Ambulatory Visit: Payer: Self-pay | Admitting: Family Medicine

## 2017-01-14 DIAGNOSIS — IMO0002 Reserved for concepts with insufficient information to code with codable children: Secondary | ICD-10-CM

## 2017-01-14 DIAGNOSIS — E1165 Type 2 diabetes mellitus with hyperglycemia: Principal | ICD-10-CM

## 2017-01-14 DIAGNOSIS — E785 Hyperlipidemia, unspecified: Secondary | ICD-10-CM

## 2017-01-14 DIAGNOSIS — I10 Essential (primary) hypertension: Secondary | ICD-10-CM

## 2017-01-14 DIAGNOSIS — E1151 Type 2 diabetes mellitus with diabetic peripheral angiopathy without gangrene: Secondary | ICD-10-CM

## 2017-01-15 NOTE — Telephone Encounter (Signed)
Patient is not in the database system.    PC

## 2017-01-15 NOTE — Telephone Encounter (Signed)
Requesting: Xanax Contract: Yes  UDS: 08/28/16 low risk  Last OV:11/16/16 Last Refill: 10/16/16  #[id number]  Please Advise

## 2017-01-15 NOTE — Telephone Encounter (Signed)
rx faxed   PC 

## 2017-01-15 NOTE — Telephone Encounter (Signed)
Requesting: Trmadol Contract: YEs UDS: Yes 08/28/16 low risk  Last OV:11/16/16 Last Refill: 12/06/16 [id number]  Please Advise

## 2017-01-15 NOTE — Telephone Encounter (Signed)
database 

## 2017-01-15 NOTE — Telephone Encounter (Signed)
Need database if not done in 3 months

## 2017-01-29 ENCOUNTER — Encounter: Payer: Self-pay | Admitting: Vascular Surgery

## 2017-02-03 ENCOUNTER — Other Ambulatory Visit: Payer: Self-pay | Admitting: Family Medicine

## 2017-02-06 ENCOUNTER — Ambulatory Visit (HOSPITAL_COMMUNITY)
Admission: RE | Admit: 2017-02-06 | Discharge: 2017-02-06 | Disposition: A | Discharge status: Home / Self Care | Payer: 59 | Source: Ambulatory Visit | Attending: Vascular Surgery | Admitting: Vascular Surgery

## 2017-02-06 ENCOUNTER — Ambulatory Visit (INDEPENDENT_AMBULATORY_CARE_PROVIDER_SITE_OTHER): Payer: 59 | Admitting: Vascular Surgery

## 2017-02-06 ENCOUNTER — Encounter: Payer: Self-pay | Admitting: Vascular Surgery

## 2017-02-06 VITALS — BP 86/55 | HR 82 | Temp 99.1°F | Resp 16 | Ht 62.5 in | Wt 206.8 lb

## 2017-02-06 DIAGNOSIS — M7989 Other specified soft tissue disorders: Secondary | ICD-10-CM | POA: Diagnosis present

## 2017-02-06 DIAGNOSIS — M79605 Pain in left leg: Secondary | ICD-10-CM

## 2017-02-06 DIAGNOSIS — L03119 Cellulitis of unspecified part of limb: Secondary | ICD-10-CM | POA: Diagnosis not present

## 2017-02-06 DIAGNOSIS — R609 Edema, unspecified: Secondary | ICD-10-CM | POA: Diagnosis not present

## 2017-02-06 DIAGNOSIS — I872 Venous insufficiency (chronic) (peripheral): Secondary | ICD-10-CM | POA: Diagnosis not present

## 2017-02-06 DIAGNOSIS — M79604 Pain in right leg: Secondary | ICD-10-CM | POA: Insufficient documentation

## 2017-02-06 NOTE — Progress Notes (Signed)
Reason for referral: Swollen B leg swelling and pain  Requesting Physician Dr. Roma Schanz  History of Present Illness  Beth Shepard is a 61 y.o. female who presents with chief complaint: swollen legs for over 5 years on and off, with hypersensitivity to painful touch.   There is no common trigger for the pain.  Her left leg is more painful than her right.  She states she does have a history of lumbar pain and has known DDD.   The patient has had no history of DVT, no history of varicose vein, no history of venous stasis ulcers, no history of  Lymphedema and positive history of skin changes in lower legs due to spider veins.  There is no family history of venous disorders.  The patient has  used compression stockings in the past after a hospitalization for viral infection to her heart and after right TKA.  Marland Kitchen   Past medical history includes DM, DDD, Anxiety, CHF, HTN, hyperlipidemia, and a sedentary job.      Past Medical History:  Diagnosis Date  . Anxiety   . Bilateral edema of lower extremity    Swelling in legs and ankles  . Congestive heart failure ([place])   . COPD (chronic obstructive pulmonary disease) ([place])   . Diabetes mellitus    type 2  . Gastric polyps   . GERD (gastroesophageal reflux disease)    chronic  . Hyperlipidemia   . Hypertension   . IBS (irritable bowel syndrome)   . LBBB (left bundle branch block)    Dr. Gwenith Spitz, [facility]  . Pneumonia    hx of  . PONV (postoperative nausea and vomiting)    Had N/V after having tubal ligation and D&C  . Rheumatoid arthritis ([place])   . Shingles     Past Surgical History:  Procedure Laterality Date  . CARDIAC CATHETERIZATION     No PCI  . CHOLECYSTECTOMY    . COLONOSCOPY  06/06/2005   normal   . DILATION AND CURETTAGE OF UTERUS    . ESOPHAGOGASTRODUODENOSCOPY  06/06/2005, 11/17/2013  . JOINT REPLACEMENT Right 10/24/2015   rt hip replacement  . KNEE ARTHROSCOPY Left   . TOTAL HIP  ARTHROPLASTY Right 10/24/2015   Procedure: RIGHT TOTAL HIP ARTHROPLASTY ANTERIOR APPROACH;  Surgeon: Leandrew Koyanagi, MD;  Location: [facility];  Service: Orthopedics;  Laterality: Right;  . TUBAL LIGATION    . VAGINAL HYSTERECTOMY      Social History   Social History  . Marital status: Widowed    Spouse name: N/A  . Number of children: 2  . Years of education: N/A   Occupational History  . [employer]   Social History Main Topics  . Smoking status: Former Smoker    Years: 33.00    Types: Cigarettes    Quit date: 06/25/2002  . Smokeless tobacco: Never Used  . Alcohol use Yes     Comment: rare  . Drug use: No  . Sexual activity: Not Currently    Partners: Male   Other Topics Concern  . Not on file   Social History Narrative   Limited exercise per cardiology    Family History  Problem Relation Age of Onset  . Lymphoma Mother   . Heart disease Mother        chf, cabg x6  . Hypertension Mother   . GER disease Mother   . Hiatal hernia Mother   .  Gallstones Mother   . Cancer Sister        brain stage 4  . Heart disease Maternal Grandmother 33       MI  . Heart disease Maternal Grandfather   . Stroke Paternal Grandmother   . Hypertension Paternal Grandmother   . Stroke Paternal Grandfather   . Hypertension Paternal Grandfather   . Rheum arthritis Father   . Hypertension Daughter   . Colon cancer Neg Hx   . Esophageal cancer Neg Hx   . Pancreatic cancer Neg Hx   . Liver disease Neg Hx   . Kidney disease Neg Hx     Current Outpatient Prescriptions on File Prior to Visit  Medication Sig Dispense Refill  . Alogliptin Benzoate (NESINA) 25 MG TABS Take 1 tablet by mouth daily. 90 tablet 0  . ALPRAZolam (XANAX) 0.25 MG tablet TAKE 1 & 1/2 TABLETS BY MOUTH EACH MORNING 135 tablet 0  . aspirin 81 MG tablet Take 81 mg by mouth daily.    Marland Kitchen atorvastatin (LIPITOR) 20 MG tablet Take 1 tablet (20 mg total) by mouth daily. 30 tablet 3  . carvedilol (COREG)  12.5 MG tablet Take 1 tablet (12.5 mg total) by mouth 2 (two) times daily. 180 tablet 1  . fenofibrate 160 MG tablet Take 1 tablet (160 mg total) by mouth daily. 30 tablet 2  . furosemide (LASIX) 20 MG tablet TAKE 1 TABLET BY MOUTH DAILY 30 tablet 1  . gabapentin (NEURONTIN) 300 MG capsule Take 300 mg by mouth at bedtime.    Marland Kitchen glimepiride (AMARYL) 2 MG tablet TAKE 1 TABLET (2 MG TOTAL) BY MOUTH DAILY BEFORE BREAKFAST. 90 tablet 2  . glucose blood (ONE TOUCH TEST STRIPS) test strip 1 each by Other route as needed. Reported on 11/02/2015    . Hyoscyamine Sulfate 0.375 MG TBCR Take 1 tablet (0.375 mg total) by mouth 2 (two) times daily. 60 tablet 1  . lisinopril (PRINIVIL,ZESTRIL) 20 MG tablet TAKE 1 TABLET BY MOUTH EVERY DAY 90 tablet 0  . methocarbamol (ROBAXIN) 500 MG tablet Take 1 tablet (500 mg total) by mouth every 6 (six) hours as needed for muscle spasms. 30 tablet 2  . ondansetron (ZOFRAN) 4 MG tablet TAKE 1-2 TABLETS BY MOUTH EVERY 8 HOURS AS NEEDED FOR NAUSEA FOR NAUSEA 40 tablet 0  . pantoprazole (PROTONIX) 20 MG tablet TAKE 1 TABLET (20 MG TOTAL) BY MOUTH DAILY. 90 tablet 3  . spironolactone (ALDACTONE) 25 MG tablet Take 1 tablet (25 mg total) by mouth every other day. Dr Ola Spurr    . traMADol (ULTRAM) 50 MG tablet TAKE 1 OR 2 TABLETS BY MOUTH EVERY 6 HOURS AS NEEDED 60 tablet 0   No current facility-administered medications on file prior to visit.     Allergies as of 02/06/2017 - Review Complete 02/06/2017  Allergen Reaction Noted  . Prednisone Palpitations and Other (See Comments) 09/03/2011  . Codeine  10/12/2005  . Ibuprofen Other (See Comments) 10/12/2005    ROS:   General:  No weight loss, Fever, chills  HEENT: No recent headaches, no nasal bleeding, no visual changes, no sore throat  Neurologic: No dizziness, blackouts, seizures. No recent symptoms of stroke or mini- stroke. No recent episodes of slurred speech, or temporary blindness.  Cardiac: No recent episodes  of chest pain/pressure, no shortness of breath at rest.  No shortness of breath with exertion.  Denies history of atrial fibrillation or irregular heartbeat  Vascular: No history of rest pain in feet.  No history of claudication.  No history of non-healing ulcer, No history of DVT   Pulmonary: No home oxygen, no productive cough, no hemoptysis,  No asthma or wheezing  Musculoskeletal:  [ ]  Arthritis, [ x] Low back pain,  [x ] Joint pain  Hematologic:No history of hypercoagulable state.  No history of easy bleeding.  No history of anemia  Gastrointestinal: No hematochezia or melena,  No gastroesophageal reflux, no trouble swallowing [x]  irritable bowel   Urinary: [ ]  chronic Kidney disease, [ ]  on HD - [ ]  MWF or [ ]  TTHS, [ ]  Burning with urination, [ ]  Frequent urination, [ ]  Difficulty urinating;   Skin: No rashes  Psychological: positive history of anxiety,  positive history of depression   Physical Examination  Vitals:   02/06/17 1420  BP: (!) 86/55  Pulse: 82  Resp: 16  Temp: 99.1 F (37.3 C)  TempSrc: Oral  SpO2: 96%  Weight: 206 lb 12.8 oz (93.8 kg)  Height: 5' 2.5" (1.588 m)    Body mass index is 37.22 kg/m.  General:  Alert and oriented, no acute distress HEENT: Normal Neck: No bruit or JVD Pulmonary: Clear to auscultation bilaterally Cardiac: Regular Rate and Rhythm without murmur Abdomen: Soft, non-tender, non-distended, no mass, no scars Skin: No rash, B ankle spider veins Extremity Pulses:  2+ radial,  femoral, dorsalis pedis, posterior tibial pulses bilaterally Musculoskeletal: No deformity, positive BLE edema, varicosities B: small to moderate sized Neurologic: Upper and lower extremity motor 5/5 and symmetric Psychiatric: Judgment intact, Mood & affect depressed Lymph : No Cervical, Axillary, or Inguinal lymphadenopathy    DATA: Venous reflux study 02/06/2017 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   Plan: She is not at risk of limb loss.  She may have referred pain due to her lumbar DDD and or due to long standing DM.  She has patent functioning venous and arterial blood flow.  I advised her to increase her activity either with pool activity that will decrease lumbar load and increase external leg pressures to decrease edema.  She could start a program of activity using a recumbent bike in her home.    F/U with [place] PRN  Theda Sers, Beth Shepard Beth Shepard [facility]  The patient was seen in conjunction with Dr. Bridgett Larsson today   Addendum  I have independently interviewed and examined the patient, and I agree with the physician assistant's findings.  Pt has palpable pedal pulses and limited CVI.  No venous interventions needed at this point, as I suspect her edema is due to systemic factors.  Compression might be helpful but historically it appears the patient's compliance has been limited.  Adele Barthel, MD, FACS [facility] Office: [phone] Pager: [phone]  02/06/2017, 4:42 PM

## 2017-02-09 ENCOUNTER — Other Ambulatory Visit: Payer: Self-pay | Admitting: Family Medicine

## 2017-02-09 DIAGNOSIS — E785 Hyperlipidemia, unspecified: Secondary | ICD-10-CM

## 2017-02-09 DIAGNOSIS — E1165 Type 2 diabetes mellitus with hyperglycemia: Secondary | ICD-10-CM

## 2017-02-09 DIAGNOSIS — IMO0002 Reserved for concepts with insufficient information to code with codable children: Secondary | ICD-10-CM

## 2017-02-09 DIAGNOSIS — E1151 Type 2 diabetes mellitus with diabetic peripheral angiopathy without gangrene: Secondary | ICD-10-CM

## 2017-02-09 DIAGNOSIS — I10 Essential (primary) hypertension: Secondary | ICD-10-CM

## 2017-02-18 ENCOUNTER — Other Ambulatory Visit: Payer: Self-pay | Admitting: Family Medicine

## 2017-02-18 DIAGNOSIS — E1165 Type 2 diabetes mellitus with hyperglycemia: Principal | ICD-10-CM

## 2017-02-18 DIAGNOSIS — E1151 Type 2 diabetes mellitus with diabetic peripheral angiopathy without gangrene: Secondary | ICD-10-CM

## 2017-02-18 DIAGNOSIS — I10 Essential (primary) hypertension: Secondary | ICD-10-CM

## 2017-02-18 DIAGNOSIS — IMO0002 Reserved for concepts with insufficient information to code with codable children: Secondary | ICD-10-CM

## 2017-02-18 DIAGNOSIS — E785 Hyperlipidemia, unspecified: Secondary | ICD-10-CM

## 2017-03-28 ENCOUNTER — Other Ambulatory Visit: Payer: Self-pay | Admitting: Family Medicine

## 2017-03-29 ENCOUNTER — Ambulatory Visit (INDEPENDENT_AMBULATORY_CARE_PROVIDER_SITE_OTHER): Payer: Worker's Compensation

## 2017-03-29 ENCOUNTER — Encounter (INDEPENDENT_AMBULATORY_CARE_PROVIDER_SITE_OTHER): Payer: Self-pay | Admitting: Orthopaedic Surgery

## 2017-03-29 ENCOUNTER — Ambulatory Visit (INDEPENDENT_AMBULATORY_CARE_PROVIDER_SITE_OTHER): Payer: Worker's Compensation | Admitting: Orthopaedic Surgery

## 2017-03-29 DIAGNOSIS — M797 Fibromyalgia: Secondary | ICD-10-CM

## 2017-03-29 DIAGNOSIS — M25551 Pain in right hip: Secondary | ICD-10-CM

## 2017-03-29 NOTE — Progress Notes (Signed)
Office Visit Note   Patient: Beth Shepard           Date of Birth: 27-Feb-1956           MRN: [id number] Visit Date: 03/29/2017              Requested by: [address] [place], [place] [address] [place], [place] [address] PCP: Carollee Herter, Alferd Apa, DO   Assessment & Plan: Visit Diagnoses:  1. Pain in right hip   2. Fibromyalgia     Plan: Her findings are not consistent with any complications regarding her hip replacement. She has very dystrophic pain and is consistent for a long the lines of fibromyalgia or chronic pain syndrome. I also do think that she may have a component of lumbar radiculopathy for which we are going to order an MRI of the lumbar spine. We will let her know what the results are. We may need to consider referring her to a pain management clinic. She specified follow-up with her primary care doctor next week. Total face to face encounter time was greater than 25 minutes and over half of this time was spent in counseling and/or coordination of care.  Follow-Up Instructions: Return if symptoms worsen or fail to improve.   Orders:  Orders Placed This Encounter  Procedures  . XR HIP UNILAT W OR W/O PELVIS 2-3 VIEWS RIGHT  . MR Lumbar Spine w/o contrast   No orders of the defined types were placed in this encounter.     Procedures: No procedures performed   Clinical Data: No additional findings.   Subjective: Chief Complaint  Patient presents with  . Right Hip - Pain  . Left Hip - Pain    Patient is a 61 year old female who comes in with right hip and thigh pain. She denies any groin pain. She can lines of continued and severe constant pain. She is borderline rheumatoid arthritis but her rheumatologist thinks that she is more along the spectrum of fibromyalgia. She takes tramadol, Goody's powder, ibuprofen, Tylenol, Advil, Aleve for her pain. Likely cuff or when she is sitting down. She endorses pain with light touch throughout her right  lower extremity. The pain radiates from her back down into her foot.    Review of Systems  Constitutional: Negative.   HENT: Negative.   Eyes: Negative.   Respiratory: Negative.   Cardiovascular: Negative.   Endocrine: Negative.   Musculoskeletal: Negative.   Neurological: Negative.   Hematological: Negative.   Psychiatric/Behavioral: Negative.   All other systems reviewed and are negative.    Objective: Vital Signs: There were no vitals taken for this visit.  Physical Exam  Constitutional: She is oriented to person, place, and time. She appears well-developed and well-nourished.  Pulmonary/Chest: Effort normal.  Neurological: She is alert and oriented to person, place, and time.  Skin: Skin is warm. Capillary refill takes less than 2 seconds.  Psychiatric: She has a normal mood and affect. Her behavior is normal. Judgment and thought content normal.  Nursing note and vitals reviewed.   Ortho Exam Right lower extremity shows a fully healed surgical scar. She has pain with just light touch of her thigh and the leg. She has significant pain with any physical examination of the right lower extremity. Specialty Comments:  No specialty comments available.  Imaging: Xr Hip Unilat W Or W/o Pelvis 2-3 Views Right  Result Date: 03/29/2017 Stable right total hip replacement without complication    PMFS  History: Patient Active Problem List   Diagnosis Date Noted  . Pain in both lower extremities 02/06/2017  . Chronic venous insufficiency 02/06/2017  . Cellulitis of lower extremity 08/28/2016  . Pain in right ankle and joints of right foot 08/09/2016  . Trochanteric bursitis, right hip 05/03/2016  . Chronic kidney disease (CKD) stage G3a/A1, moderately decreased glomerular filtration rate (GFR) between 45-59 mL/min/1.73 square meter and albuminuria creatinine ratio less than 30 mg/g (HCC) 04/07/2016  . Primary osteoarthritis of right hip 10/24/2015  . Hip joint replacement  status 10/24/2015  . Chest pain 08/25/2015  . Right hip pain 08/25/2015  . Chest pain, precordial 08/10/2015  . Cardiomyopathy ([place]) 01/18/2015  . Block, bundle branch, left 01/18/2015  . Nausea alone 12/17/2013  . Diarrhea 11/19/2013  . Bloating 11/19/2013  . Obesity (BMI 30-39.9) 10/21/2013  . Multiple joint pain 11/13/2012  . MYALGIA 08/29/2010  . ABDOMINAL PAIN OTHER SPECIFIED SITE 08/22/2009  . OSTEOARTHROS UNSPEC WHETHER GEN/LOC UNSPEC SITE 03/17/2009  . Cellulitis and abscess of face 12/17/2008  . Disturbance of skin sensation 12/17/2008  . ACUTE BRONCHITIS 05/07/2008  . Anxiety 12/04/2007  . SINUSITIS, ACUTE NOS 12/19/2006  . DM (diabetes mellitus) type II uncontrolled, periph vascular disorder ([place]) 10/08/2006  . Hyperlipidemia LDL goal <70 10/08/2006  . Essential hypertension 10/08/2006  . COPD 10/08/2006  . GERD 10/08/2006  . Other postprocedural status(V45.89) 10/08/2006   Past Medical History:  Diagnosis Date  . Anxiety   . Bilateral edema of lower extremity    Swelling in legs and ankles  . Congestive heart failure ([place])   . COPD (chronic obstructive pulmonary disease) ([place])   . Diabetes mellitus    type 2  . Gastric polyps   . GERD (gastroesophageal reflux disease)    chronic  . Hyperlipidemia   . Hypertension   . IBS (irritable bowel syndrome)   . LBBB (left bundle branch block)    Dr. Gwenith Spitz, [facility]  . Pneumonia    hx of  . PONV (postoperative nausea and vomiting)    Had N/V after having tubal ligation and D&C  . Rheumatoid arthritis ([place])   . Shingles     Family History  Problem Relation Age of Onset  . Lymphoma Mother   . Heart disease Mother        chf, cabg x6  . Hypertension Mother   . GER disease Mother   . Hiatal hernia Mother   . Gallstones Mother   . Cancer Sister        brain stage 4  . Heart disease Maternal Grandmother 32       MI  . Heart disease Maternal Grandfather   . Stroke Paternal Grandmother   .  Hypertension Paternal Grandmother   . Stroke Paternal Grandfather   . Hypertension Paternal Grandfather   . Rheum arthritis Father   . Hypertension Daughter   . Colon cancer Neg Hx   . Esophageal cancer Neg Hx   . Pancreatic cancer Neg Hx   . Liver disease Neg Hx   . Kidney disease Neg Hx     Past Surgical History:  Procedure Laterality Date  . CARDIAC CATHETERIZATION     No PCI  . CHOLECYSTECTOMY    . COLONOSCOPY  06/06/2005   normal   . DILATION AND CURETTAGE OF UTERUS    . ESOPHAGOGASTRODUODENOSCOPY  06/06/2005, 11/17/2013  . JOINT REPLACEMENT Right 10/24/2015   rt hip replacement  . KNEE ARTHROSCOPY Left   . TOTAL  HIP ARTHROPLASTY Right 10/24/2015   Procedure: RIGHT TOTAL HIP ARTHROPLASTY ANTERIOR APPROACH;  Surgeon: Leandrew Koyanagi, MD;  Location: [facility];  Service: Orthopedics;  Laterality: Right;  . TUBAL LIGATION    . VAGINAL HYSTERECTOMY     Social History   Occupational History  . [employer]   Social History Main Topics  . Smoking status: Former Smoker    Years: 33.00    Types: Cigarettes    Quit date: 06/25/2002  . Smokeless tobacco: Never Used  . Alcohol use Yes     Comment: rare  . Drug use: No  . Sexual activity: Not Currently    Partners: Male

## 2017-04-05 ENCOUNTER — Encounter: Payer: Self-pay | Admitting: Family Medicine

## 2017-04-05 ENCOUNTER — Ambulatory Visit (INDEPENDENT_AMBULATORY_CARE_PROVIDER_SITE_OTHER): Payer: 59 | Admitting: Family Medicine

## 2017-04-05 VITALS — BP 120/78 | HR 80 | Temp 98.1°F | Ht 62.0 in | Wt 206.0 lb

## 2017-04-05 DIAGNOSIS — E785 Hyperlipidemia, unspecified: Secondary | ICD-10-CM | POA: Diagnosis not present

## 2017-04-05 DIAGNOSIS — IMO0002 Reserved for concepts with insufficient information to code with codable children: Secondary | ICD-10-CM

## 2017-04-05 DIAGNOSIS — K589 Irritable bowel syndrome without diarrhea: Secondary | ICD-10-CM

## 2017-04-05 DIAGNOSIS — M797 Fibromyalgia: Secondary | ICD-10-CM

## 2017-04-05 DIAGNOSIS — E1165 Type 2 diabetes mellitus with hyperglycemia: Secondary | ICD-10-CM | POA: Diagnosis not present

## 2017-04-05 DIAGNOSIS — Z23 Encounter for immunization: Secondary | ICD-10-CM

## 2017-04-05 DIAGNOSIS — I10 Essential (primary) hypertension: Secondary | ICD-10-CM | POA: Diagnosis not present

## 2017-04-05 DIAGNOSIS — E1151 Type 2 diabetes mellitus with diabetic peripheral angiopathy without gangrene: Secondary | ICD-10-CM | POA: Diagnosis not present

## 2017-04-05 DIAGNOSIS — E119 Type 2 diabetes mellitus without complications: Secondary | ICD-10-CM | POA: Diagnosis not present

## 2017-04-05 DIAGNOSIS — G894 Chronic pain syndrome: Secondary | ICD-10-CM

## 2017-04-05 DIAGNOSIS — L659 Nonscarring hair loss, unspecified: Secondary | ICD-10-CM

## 2017-04-05 MED ORDER — GLIMEPIRIDE 2 MG PO TABS: ORAL_TABLET | ORAL | 2 refills | Status: DC

## 2017-04-05 MED ORDER — ALPRAZOLAM 0.25 MG PO TABS: ORAL_TABLET | ORAL | 0 refills | Status: DC

## 2017-04-05 MED ORDER — HYOSCYAMINE SULFATE ER 0.375 MG PO TBCR: 1.0000 | EXTENDED_RELEASE_TABLET | Freq: Two times a day (BID) | ORAL | 1 refills | Status: DC

## 2017-04-05 MED ORDER — ATORVASTATIN CALCIUM 20 MG PO TABS: 20.0000 mg | ORAL_TABLET | Freq: Every day | ORAL | 1 refills | Status: DC

## 2017-04-05 NOTE — Progress Notes (Signed)
Patient ID: Beth Shepard, female    DOB: Oct 26, 1955  Age: 61 y.o. MRN: [id number]    Subjective:  Subjective  HPI Beth Shepard presents for f/u dm , cholesterol , htn. She is also here for f/u fibromyalgia,     HYPERTENSION   Blood pressure range-not checking   Chest pain- no      Dyspnea- no Lightheadedness- no   Edema- no  Other side effects - no   Medication compliance: good Low salt diet- yes    DIABETES    Blood Sugar ranges-good per pt  Polyuria- no New Visual problems- no  Hypoglycemic symptoms- no  Other side effects-no Medication compliance - good Last eye exam-due   HYPERLIPIDEMIA  Medication compliance- good RUQ pain- no  Muscle aches- no Other side effects-no    Review of Systems  Constitutional: Positive for fatigue. Negative for activity change, appetite change and unexpected weight change.  Respiratory: Negative for cough and shortness of breath.   Cardiovascular: Negative for chest pain and palpitations.  Musculoskeletal: Positive for arthralgias, back pain and myalgias.  Psychiatric/Behavioral: Negative for behavioral problems and dysphoric mood. The patient is not nervous/anxious.     History Past Medical History:  Diagnosis Date  . Anxiety   . Bilateral edema of lower extremity    Swelling in legs and ankles  . Congestive heart failure ([place])   . COPD (chronic obstructive pulmonary disease) ([place])   . Diabetes mellitus    type 2  . Gastric polyps   . GERD (gastroesophageal reflux disease)    chronic  . Hyperlipidemia   . Hypertension   . IBS (irritable bowel syndrome)   . LBBB (left bundle branch block)    Dr. Gwenith Spitz, [facility]  . Pneumonia    hx of  . PONV (postoperative nausea and vomiting)    Had N/V after having tubal ligation and D&C  . Rheumatoid arthritis ([place])   . Shingles     She has a past surgical history that includes Vaginal hysterectomy; Cholecystectomy; Knee arthroscopy (Left); Colonoscopy  (06/06/2005); Esophagogastroduodenoscopy (06/06/2005, 11/17/2013); Cardiac catheterization; Tubal ligation; Dilation and curettage of uterus; Total hip arthroplasty (Right, 10/24/2015); and Joint replacement (Right, 10/24/2015).   Her family history includes Cancer in her sister; GER disease in her mother; Gallstones in her mother; Heart disease in her maternal grandfather and mother; Heart disease (age of onset: 2) in her maternal grandmother; Hiatal hernia in her mother; Hypertension in her daughter, mother, paternal grandfather, and paternal grandmother; Lymphoma in her mother; Rheum arthritis in her father; Stroke in her paternal grandfather and paternal grandmother.She reports that she quit smoking about 14 years ago. Her smoking use included Cigarettes. She quit after 33.00 years of use. She has never used smokeless tobacco. She reports that she drinks alcohol. She reports that she does not use drugs.  Current Outpatient Prescriptions on File Prior to Visit  Medication Sig Dispense Refill  . Alogliptin Benzoate 25 MG TABS TAKE 1 TABLET BY MOUTH EVERY DAY 90 tablet 0  . aspirin 81 MG tablet Take 81 mg by mouth daily.    . carvedilol (COREG) 12.5 MG tablet Take 1 tablet (12.5 mg total) by mouth 2 (two) times daily. 180 tablet 1  . fenofibrate 160 MG tablet Take 1 tablet (160 mg total) by mouth daily. 30 tablet 2  . furosemide (LASIX) 20 MG tablet TAKE 1 TABLET BY MOUTH EVERY DAY 30 tablet 1  . glucose blood (ONE TOUCH TEST STRIPS) test strip 1 each  by Other route as needed. Reported on 11/02/2015    . lisinopril (PRINIVIL,ZESTRIL) 20 MG tablet TAKE 1 TABLET BY MOUTH EVERY DAY 90 tablet 0  . methocarbamol (ROBAXIN) 500 MG tablet Take 1 tablet (500 mg total) by mouth every 6 (six) hours as needed for muscle spasms. 30 tablet 2  . ondansetron (ZOFRAN) 4 MG tablet TAKE 1-2 TABLETS BY MOUTH EVERY 8 HOURS AS NEEDED FOR NAUSEA FOR NAUSEA 40 tablet 0  . pantoprazole (PROTONIX) 20 MG tablet Take 1 tablet (20  mg total) by mouth daily. 90 tablet 0  . spironolactone (ALDACTONE) 25 MG tablet Take 1 tablet (25 mg total) by mouth every other day. Dr Ola Spurr    . traMADol (ULTRAM) 50 MG tablet TAKE 1 OR 2 TABLETS BY MOUTH EVERY 6 HOURS AS NEEDED 60 tablet 0  . gabapentin (NEURONTIN) 300 MG capsule Take 300 mg by mouth at bedtime.     No current facility-administered medications on file prior to visit.      Objective:  Objective  Physical Exam  Constitutional: She is oriented to person, place, and time. She appears well-developed and well-nourished.  HENT:  Head: Normocephalic and atraumatic.  Eyes: Conjunctivae and EOM are normal.  Neck: Normal range of motion. Neck supple. No JVD present. Carotid bruit is not present. No thyromegaly present.  Cardiovascular: Normal rate, regular rhythm and normal heart sounds.   No murmur heard. Pulmonary/Chest: Effort normal and breath sounds normal. No respiratory distress. She has no wheezes. She has no rales. She exhibits no tenderness.  Musculoskeletal: She exhibits tenderness. She exhibits no edema.  Neurological: She is alert and oriented to person, place, and time.  Psychiatric: She has a normal mood and affect.  Nursing note and vitals reviewed. Sensory exam of the foot is normal, tested with the monofilament. Good pulses, no lesions or ulcers, good peripheral pulses.  BP 120/78   Pulse 80   Temp 98.1 F (36.7 C) (Oral)   Ht 5\' 2"  (1.575 m)   Wt 206 lb (93.4 kg)   SpO2 98%   BMI 37.68 kg/m  Wt Readings from Last 3 Encounters:  04/05/17 206 lb (93.4 kg)  02/06/17 206 lb 12.8 oz (93.8 kg)  11/16/16 210 lb (95.3 kg)     Lab Results  Component Value Date   WBC 10.7 (H) 08/28/2016   HGB 11.2 (L) 08/28/2016   HCT 34.1 (L) 08/28/2016   PLT 371.0 08/28/2016   GLUCOSE 149 (H) 11/16/2016   CHOL 220 (H) 11/16/2016   TRIG 311.0 (H) 11/16/2016   HDL 40.00 11/16/2016   LDLDIRECT 112.0 11/16/2016   LDLCALC 115 (H) 04/06/2016   ALT 11  11/16/2016   AST 14 11/16/2016   NA 137 11/16/2016   K 4.9 11/16/2016   CL 103 11/16/2016   CREATININE 1.19 11/16/2016   BUN 24 (H) 11/16/2016   CO2 28 11/16/2016   TSH 0.890 10/21/2013   INR 0.99 10/14/2015   HGBA1C 8.1 (H) 11/16/2016   MICROALBUR 0.8 09/07/2014    No results found.   Assessment & Plan:  Plan  I am having Ms. Palla maintain her glucose blood, aspirin, gabapentin, spironolactone, carvedilol, methocarbamol, fenofibrate, ondansetron, traMADol, lisinopril, furosemide, pantoprazole, Alogliptin Benzoate, ALPRAZolam, atorvastatin, Hyoscyamine Sulfate, and glimepiride.  Meds ordered this encounter  Medications  . ALPRAZolam (XANAX) 0.25 MG tablet    Sig: TAKE 1 & 1/2 TABLETS BY MOUTH EACH MORNING    Dispense:  135 tablet    Refill:  0  Not to exceed 5 additional fills before 04/14/2017.  Marland Kitchen atorvastatin (LIPITOR) 20 MG tablet    Sig: Take 1 tablet (20 mg total) by mouth daily.    Dispense:  90 tablet    Refill:  1  . Hyoscyamine Sulfate 0.375 MG TBCR    Sig: Take 1 tablet (0.375 mg total) by mouth 2 (two) times daily.    Dispense:  60 tablet    Refill:  1  . glimepiride (AMARYL) 2 MG tablet    Sig: TAKE 1 TABLET (2 MG TOTAL) BY MOUTH DAILY BEFORE BREAKFAST.    Dispense:  90 tablet    Refill:  2    Problem List Items Addressed This Visit      Unprioritized   Chronic pain syndrome    Pt having mri on back Consider pain management Unable to give hydrocodone because she is on xanax  She would have to wean off the xanax to taking something stronger.       Relevant Orders   Ambulatory referral to Pain Clinic   DM (diabetes mellitus) type II uncontrolled, periph vascular disorder (HCC)   Relevant Medications   ALPRAZolam (XANAX) 0.25 MG tablet   atorvastatin (LIPITOR) 20 MG tablet   glimepiride (AMARYL) 2 MG tablet   Essential hypertension   Relevant Medications   ALPRAZolam (XANAX) 0.25 MG tablet   atorvastatin (LIPITOR) 20 MG tablet   Hyoscyamine  Sulfate 0.375 MG TBCR   glimepiride (AMARYL) 2 MG tablet   Fibromyalgia   Relevant Orders   Ambulatory referral to Pain Clinic    Other Visit Diagnoses    Need for immunization against influenza    -  Primary   Relevant Orders   Flu Vaccine QUAD 6+ mos IM (Fluarix)   Type 2 diabetes mellitus without complication, without long-term current use of insulin (HCC)       Relevant Medications   atorvastatin (LIPITOR) 20 MG tablet   glimepiride (AMARYL) 2 MG tablet   Other Relevant Orders   Hemoglobin A1c   Comprehensive metabolic panel   Microalbumin / creatinine urine ratio   Hyperlipidemia LDL goal <100       Relevant Medications   atorvastatin (LIPITOR) 20 MG tablet   Other Relevant Orders   Lipid panel   Comprehensive metabolic panel   Hyperlipidemia, unspecified hyperlipidemia type       Relevant Medications   ALPRAZolam (XANAX) 0.25 MG tablet   atorvastatin (LIPITOR) 20 MG tablet   glimepiride (AMARYL) 2 MG tablet   Irritable bowel syndrome, unspecified type       Relevant Medications   Hyoscyamine Sulfate 0.375 MG TBCR   Hair loss       Relevant Orders   TSH      Follow-up: Return if symptoms worsen or fail to improve.  Ann Held, DO

## 2017-04-05 NOTE — Patient Instructions (Signed)

## 2017-04-05 NOTE — Assessment & Plan Note (Addendum)
Pt having mri on back Consider pain management Unable to give hydrocodone because she is on xanax  She would have to wean off the xanax to taking something stronger.

## 2017-04-09 DIAGNOSIS — Z23 Encounter for immunization: Secondary | ICD-10-CM | POA: Diagnosis not present

## 2017-04-09 DIAGNOSIS — G894 Chronic pain syndrome: Secondary | ICD-10-CM | POA: Diagnosis not present

## 2017-04-09 DIAGNOSIS — E119 Type 2 diabetes mellitus without complications: Secondary | ICD-10-CM | POA: Diagnosis not present

## 2017-04-09 DIAGNOSIS — M797 Fibromyalgia: Secondary | ICD-10-CM | POA: Diagnosis not present

## 2017-04-15 ENCOUNTER — Ambulatory Visit
Admission: RE | Admit: 2017-04-15 | Discharge: 2017-04-15 | Disposition: A | Discharge status: Home / Self Care | Payer: 59 | Source: Ambulatory Visit | Attending: Orthopaedic Surgery | Admitting: Orthopaedic Surgery

## 2017-04-15 DIAGNOSIS — M25551 Pain in right hip: Secondary | ICD-10-CM

## 2017-04-15 DIAGNOSIS — M5127 Other intervertebral disc displacement, lumbosacral region: Secondary | ICD-10-CM | POA: Diagnosis not present

## 2017-04-17 ENCOUNTER — Telehealth (INDEPENDENT_AMBULATORY_CARE_PROVIDER_SITE_OTHER): Payer: Self-pay | Admitting: Orthopaedic Surgery

## 2017-04-17 NOTE — Telephone Encounter (Signed)
Patient request a call with her MRI results

## 2017-04-18 ENCOUNTER — Other Ambulatory Visit (INDEPENDENT_AMBULATORY_CARE_PROVIDER_SITE_OTHER): Payer: Self-pay

## 2017-04-18 DIAGNOSIS — M5136 Other intervertebral disc degeneration, lumbar region: Secondary | ICD-10-CM

## 2017-04-18 NOTE — Telephone Encounter (Signed)
Please advise 

## 2017-04-18 NOTE — Telephone Encounter (Signed)
She has a degenerative disk disease.  We need to refer her to Dr. Ernestina Patches for [facility].

## 2017-04-18 NOTE — Telephone Encounter (Signed)
Patient aware of the below message from [place], and we sent order to [facility]

## 2017-04-19 ENCOUNTER — Other Ambulatory Visit: Payer: Self-pay | Admitting: Family Medicine

## 2017-05-07 ENCOUNTER — Encounter (INDEPENDENT_AMBULATORY_CARE_PROVIDER_SITE_OTHER): Payer: Self-pay | Admitting: Physical Medicine and Rehabilitation

## 2017-05-13 ENCOUNTER — Other Ambulatory Visit: Payer: Self-pay | Admitting: Family Medicine

## 2017-05-14 ENCOUNTER — Encounter (INDEPENDENT_AMBULATORY_CARE_PROVIDER_SITE_OTHER): Payer: Self-pay | Admitting: Physical Medicine and Rehabilitation

## 2017-05-22 ENCOUNTER — Encounter (INDEPENDENT_AMBULATORY_CARE_PROVIDER_SITE_OTHER): Payer: Self-pay | Admitting: Physical Medicine and Rehabilitation

## 2017-05-23 ENCOUNTER — Ambulatory Visit (INDEPENDENT_AMBULATORY_CARE_PROVIDER_SITE_OTHER): Payer: Self-pay

## 2017-05-23 ENCOUNTER — Ambulatory Visit (INDEPENDENT_AMBULATORY_CARE_PROVIDER_SITE_OTHER): Payer: 59 | Admitting: Physical Medicine and Rehabilitation

## 2017-05-23 ENCOUNTER — Encounter (INDEPENDENT_AMBULATORY_CARE_PROVIDER_SITE_OTHER): Payer: Self-pay | Admitting: Physical Medicine and Rehabilitation

## 2017-05-23 VITALS — BP 113/59 | HR 87 | Temp 98.0°F

## 2017-05-23 DIAGNOSIS — M4316 Spondylolisthesis, lumbar region: Secondary | ICD-10-CM

## 2017-05-23 DIAGNOSIS — M5416 Radiculopathy, lumbar region: Secondary | ICD-10-CM

## 2017-05-23 DIAGNOSIS — M48062 Spinal stenosis, lumbar region with neurogenic claudication: Secondary | ICD-10-CM

## 2017-05-23 MED ORDER — LIDOCAINE HCL (PF) 1 % IJ SOLN
2.0000 mL | Freq: Once | INTRAMUSCULAR | Status: AC
  Administered 2017-05-23: 2 mL

## 2017-05-23 MED ORDER — BETAMETHASONE SOD PHOS & ACET 6 (3-3) MG/ML IJ SUSP
12.0000 mg | Freq: Once | INTRAMUSCULAR | Status: AC
  Administered 2017-05-23: 12 mg

## 2017-05-23 NOTE — Progress Notes (Deleted)
Pain across low back and radiates down left leg to ankle. Constant pain. Doesn't relate to any certain movement or position. Numbness and tingling in leg.

## 2017-05-23 NOTE — Patient Instructions (Signed)

## 2017-05-24 ENCOUNTER — Other Ambulatory Visit: Payer: Self-pay | Admitting: Family Medicine

## 2017-05-24 DIAGNOSIS — E1151 Type 2 diabetes mellitus with diabetic peripheral angiopathy without gangrene: Secondary | ICD-10-CM

## 2017-05-24 DIAGNOSIS — E1165 Type 2 diabetes mellitus with hyperglycemia: Principal | ICD-10-CM

## 2017-05-24 DIAGNOSIS — E785 Hyperlipidemia, unspecified: Secondary | ICD-10-CM

## 2017-05-24 DIAGNOSIS — I10 Essential (primary) hypertension: Secondary | ICD-10-CM

## 2017-05-24 DIAGNOSIS — IMO0002 Reserved for concepts with insufficient information to code with codable children: Secondary | ICD-10-CM

## 2017-05-28 ENCOUNTER — Other Ambulatory Visit: Payer: Self-pay | Admitting: Family Medicine

## 2017-05-28 DIAGNOSIS — E1165 Type 2 diabetes mellitus with hyperglycemia: Principal | ICD-10-CM

## 2017-05-28 DIAGNOSIS — I10 Essential (primary) hypertension: Secondary | ICD-10-CM

## 2017-05-28 DIAGNOSIS — E785 Hyperlipidemia, unspecified: Secondary | ICD-10-CM

## 2017-05-28 DIAGNOSIS — E1151 Type 2 diabetes mellitus with diabetic peripheral angiopathy without gangrene: Secondary | ICD-10-CM

## 2017-05-28 DIAGNOSIS — IMO0002 Reserved for concepts with insufficient information to code with codable children: Secondary | ICD-10-CM

## 2017-05-31 ENCOUNTER — Encounter: Payer: 59 | Attending: Physical Medicine & Rehabilitation | Admitting: Physical Medicine & Rehabilitation

## 2017-05-31 ENCOUNTER — Encounter (INDEPENDENT_AMBULATORY_CARE_PROVIDER_SITE_OTHER): Payer: Self-pay | Admitting: Physical Medicine and Rehabilitation

## 2017-05-31 NOTE — Procedures (Signed)
Beth Shepard is a 61 year old female followed by Dr. Erlinda Hong who is failed conservative care for her bilateral low back and hip and leg pain.  This does seem to be radicular.  Lumbar spine MRI does show factorial moderate stenosis at L3-4 and L4-5 with listhesis of L4 on L5.  We are going to complete bilateral L4 transforaminal epidural steroid injection.  She has more pain on the right than left but it is close to being equal.  The injection  will be diagnostic and hopefully therapeutic. The patient has failed conservative care including time, medications and activity modification.  We did go over in depth about blood sugar increases with steroid injections.  We also talked about her prior intolerance with oral prednisone versus the injection.  Lumbosacral Transforaminal Epidural Steroid Injection - Sub-Pedicular Approach with Fluoroscopic Guidance  Patient: Beth Shepard      Date of Birth: 04-29-1956 MRN: [id number] PCP: Ann Held, DO      Visit Date: 05/23/2017   Universal Protocol:    Date/Time: 05/23/2017  Consent Given By: the patient  Position: PRONE  Additional Comments: Vital signs were monitored before and after the procedure. Patient was prepped and draped in the usual sterile fashion. The correct patient, procedure, and site was verified.   Injection Procedure Details:  Procedure Site One Meds Administered:  Meds ordered this encounter  Medications  . lidocaine (PF) (XYLOCAINE) 1 % injection 2 mL  . betamethasone acetate-betamethasone sodium phosphate (CELESTONE) injection 12 mg    Laterality: Bilateral  Location/Site:  L4-L5  Needle size: 22 G  Needle type: Spinal  Needle Placement: Transforaminal  Findings:    -Comments: Excellent flow of contrast along the nerve and into the epidural space.  Procedure Details: After squaring off the end-plates to get a true AP view, the C-arm was positioned so that an oblique view of the foramen as noted above was  visualized. The target area is just inferior to the "nose of the scotty dog" or sub pedicular. The soft tissues overlying this structure were infiltrated with 2-3 ml. of 1% Lidocaine without Epinephrine.  The spinal needle was inserted toward the target using a "trajectory" view along the fluoroscope beam.  Under AP and lateral visualization, the needle was advanced so it did not puncture dura and was located close the 6 O'Clock position of the pedical in AP tracterory. Biplanar projections were used to confirm position. Aspiration was confirmed to be negative for CSF and/or blood. A 1-2 ml. volume of Isovue-250 was injected and flow of contrast was noted at each level. Radiographs were obtained for documentation purposes.   After attaining the desired flow of contrast documented above, a 0.5 to 1.0 ml test dose of 0.25% Marcaine was injected into each respective transforaminal space.  The patient was observed for 90 seconds post injection.  After no sensory deficits were reported, and normal lower extremity motor function was noted,   the above injectate was administered so that equal amounts of the injectate were placed at each foramen (level) into the transforaminal epidural space.   Additional Comments:  The patient tolerated the procedure well Dressing: Band-Aid    Post-procedure details: Patient was observed during the procedure. Post-procedure instructions were reviewed.  Patient left the clinic in stable condition.   Pertinent Imaging: MRI LUMBAR SPINE WITHOUT CONTRAST  TECHNIQUE: Multiplanar, multisequence MR imaging of the lumbar spine was performed. No intravenous contrast was administered.  COMPARISON:  MRI lumbar spine dated February 11, 2015.  FINDINGS:  Segmentation:  Standard.  Alignment:  8 mm anterolisthesis of L4 on L5, unchanged.  Vertebrae:  No fracture, evidence of discitis, or bone lesion.  Conus medullaris: Extends to the L1 level and appears  normal.  Paraspinal and other soft tissues: Negative.  Disc levels:  T11-T12:  Only seen on the sagittal images.  Negative.  T12-L1:  Negative.  L1-L2:  Negative.  L2-L3: Small central disc herniation without spinal canal or neuroforaminal stenosis.  L3-L4: Small broad-based disc protrusion with bilateral facet arthropathy and ligamentum flavum hypertrophy resulting in mild bilateral subarticular recess narrowing and moderate central spinal canal stenosis, slightly improved when compared to prior study. Previously described abnormal material extending into the left lateral recess has resolved. No significant neuroforaminal stenosis.  L4-L5: Disc uncovering and bulging with severe bilateral facet arthropathy, resulting in moderate central spinal canal stenosis, moderate bilateral subarticular recess narrowing, and moderate bilateral neuroforaminal stenosis, similar to prior study.  L5-S1: Small diffuse disc bulge, slightly asymmetric to the left with mild facet arthropathy resulting in mild left neuroforaminal stenosis, unchanged. No significant central spinal canal or right neuroforaminal stenosis.  IMPRESSION: 1. Multilevel degenerative changes of the lumbar spine, overall similar to prior study. Unchanged 8 mm anterolisthesis at L4-L5 and severe bilateral facet arthropathy resulting in moderate central spinal canal stenosis and moderate bilateral neuroforaminal stenosis, which could affect the bilateral exiting L4 and descending L5 nerve roots. 2. Slightly improved moderate central spinal canal stenosis at L3-L4 with resolution of previously noted abnormal material in the left subarticular recess.   Electronically Signed   By: Titus Dubin M.D.   On: 04/15/2017 10:44

## 2017-06-20 ENCOUNTER — Other Ambulatory Visit: Payer: Self-pay | Admitting: Family Medicine

## 2017-06-20 DIAGNOSIS — R110 Nausea: Secondary | ICD-10-CM

## 2017-06-20 NOTE — Telephone Encounter (Signed)
[facility], [place] requesting refill for tramadol   Database printed  Last filled per database: 04/25/17  Last written: 04/24/17 Last ov: 04/05/17 Next ov: 07/01/17 Contract: 04/05/17 UDS: due now

## 2017-06-20 NOTE — Telephone Encounter (Signed)
I have reviewed her chart and IXL- ok to refill  Meds ordered this encounter  Medications  . traMADol (ULTRAM) 50 MG tablet    Sig: TAKE 1-2 TABLETS BY MOUTH EVERY 6 HOURS AS NEEDED    Dispense:  60 tablet    Refill:  0    Not to exceed 5 additional fills before 10/22/2017.  . ondansetron (ZOFRAN) 4 MG tablet    Sig: TAKE 1-2 TABLETS BY MOUTH EVERY 8 HOURS AS NEEDED FOR NAUSEA    Dispense:  40 tablet    Refill:  0

## 2017-06-26 ENCOUNTER — Other Ambulatory Visit: Payer: Self-pay

## 2017-06-26 ENCOUNTER — Other Ambulatory Visit: Payer: Self-pay | Admitting: *Deleted

## 2017-06-26 DIAGNOSIS — E1165 Type 2 diabetes mellitus with hyperglycemia: Secondary | ICD-10-CM

## 2017-06-26 DIAGNOSIS — IMO0002 Reserved for concepts with insufficient information to code with codable children: Secondary | ICD-10-CM

## 2017-06-26 DIAGNOSIS — I10 Essential (primary) hypertension: Secondary | ICD-10-CM

## 2017-06-26 DIAGNOSIS — E1151 Type 2 diabetes mellitus with diabetic peripheral angiopathy without gangrene: Secondary | ICD-10-CM

## 2017-06-26 DIAGNOSIS — E785 Hyperlipidemia, unspecified: Secondary | ICD-10-CM

## 2017-06-26 MED ORDER — FUROSEMIDE 20 MG PO TABS: 20.0000 mg | ORAL_TABLET | Freq: Every day | ORAL | 0 refills | Status: DC

## 2017-06-26 MED ORDER — FUROSEMIDE 20 MG PO TABS: 20.0000 mg | ORAL_TABLET | Freq: Every day | ORAL | 1 refills | Status: DC

## 2017-07-01 ENCOUNTER — Encounter: Payer: 59 | Admitting: Family Medicine

## 2017-07-02 ENCOUNTER — Other Ambulatory Visit: Payer: Self-pay | Admitting: Family Medicine

## 2017-07-03 ENCOUNTER — Telehealth: Payer: Self-pay

## 2017-07-03 NOTE — Telephone Encounter (Signed)
PA initiated via Covermymeds; KEY: [id number]. Received real-time PA approval effective 06/03/2017 through 07/02/2020.

## 2017-07-10 DIAGNOSIS — I420 Dilated cardiomyopathy: Secondary | ICD-10-CM | POA: Diagnosis not present

## 2017-07-10 DIAGNOSIS — I447 Left bundle-branch block, unspecified: Secondary | ICD-10-CM | POA: Diagnosis not present

## 2017-07-11 ENCOUNTER — Telehealth: Payer: Self-pay | Admitting: Family Medicine

## 2017-07-11 NOTE — Telephone Encounter (Signed)
Copied from [place] [id number]. Topic: General - Other >> Jul 11, 2017  4:13 PM Lolita Rieger, [place] wrote: Reason for CRM: Pt would like a referral placed for removal of a cyst that was removed in 2016 that has returned, pt does not want to go back to [facility] where she was referred to at first Pt has appointment on 07/18/17 pt would also like advice on what she can do until then to help relieve some of the pain from the cyst

## 2017-07-12 NOTE — Telephone Encounter (Signed)
They are the only surgeons in [place]---  Otherwise,  [place], [place], [place], [place], [place]--- where would she like to go and then its ok to put in referral

## 2017-07-12 NOTE — Telephone Encounter (Signed)
Would you like to see her first or can we refer her somewhere else?

## 2017-07-16 NOTE — Telephone Encounter (Signed)
Left message on machine to call back to let us know where she would like to go.

## 2017-07-16 NOTE — Telephone Encounter (Signed)
Copied from [place]. Topic: Quick Communication - Office Called Patient >> Jul 16, 2017  9:42 AM [name], CMA wrote: Reason for CRM: Please message from 07/11/17 >> Jul 16, 2017  1:10 PM [name] wrote: Pt returned missed call. Pt is asking if cyst can be taken care of at [facility] in one visit or wherever she needs to go. Please call pt back to discuss

## 2017-07-18 ENCOUNTER — Ambulatory Visit: Payer: 59 | Admitting: Family Medicine

## 2017-07-18 NOTE — Telephone Encounter (Signed)
Left message on voicemail, that we will take care of this at her ov 07/19/17

## 2017-07-19 ENCOUNTER — Encounter: Payer: Self-pay | Admitting: Family Medicine

## 2017-07-19 ENCOUNTER — Ambulatory Visit (INDEPENDENT_AMBULATORY_CARE_PROVIDER_SITE_OTHER): Payer: 59 | Admitting: Family Medicine

## 2017-07-19 VITALS — BP 128/76 | HR 85 | Temp 98.5°F | Resp 16 | Ht 62.0 in | Wt 201.2 lb

## 2017-07-19 DIAGNOSIS — E1151 Type 2 diabetes mellitus with diabetic peripheral angiopathy without gangrene: Secondary | ICD-10-CM

## 2017-07-19 DIAGNOSIS — IMO0002 Reserved for concepts with insufficient information to code with codable children: Secondary | ICD-10-CM

## 2017-07-19 DIAGNOSIS — E1165 Type 2 diabetes mellitus with hyperglycemia: Secondary | ICD-10-CM | POA: Diagnosis not present

## 2017-07-19 DIAGNOSIS — L723 Sebaceous cyst: Secondary | ICD-10-CM | POA: Diagnosis not present

## 2017-07-19 DIAGNOSIS — L089 Local infection of the skin and subcutaneous tissue, unspecified: Secondary | ICD-10-CM | POA: Diagnosis not present

## 2017-07-19 DIAGNOSIS — I10 Essential (primary) hypertension: Secondary | ICD-10-CM

## 2017-07-19 DIAGNOSIS — E785 Hyperlipidemia, unspecified: Secondary | ICD-10-CM | POA: Diagnosis not present

## 2017-07-19 LAB — LIPID PANEL
CHOL/HDL RATIO: 6
Cholesterol: 220 mg/dL — ABNORMAL HIGH (ref 0–200)
HDL: 37.7 mg/dL — AB (ref >39.00)
NonHDL: 182.5
TRIGLYCERIDES: 302 mg/dL — AB (ref 0.0–149.0)
VLDL: 60.4 mg/dL — AB (ref 0.0–40.0)

## 2017-07-19 LAB — COMPREHENSIVE METABOLIC PANEL
ALBUMIN: 4.2 g/dL (ref 3.5–5.2)
ALK PHOS: 97 U/L (ref 39–117)
ALT: 9 U/L (ref 0–35)
AST: 15 U/L (ref 0–37)
BUN: 16 mg/dL (ref 6–23)
CALCIUM: 9.8 mg/dL (ref 8.4–10.5)
CHLORIDE: 105 meq/L (ref 96–112)
CO2: 28 mEq/L (ref 19–32)
Creatinine, Ser: 1 mg/dL (ref 0.40–1.20)
GFR: 59.33 mL/min — ABNORMAL LOW (ref >60.00)
Glucose, Bld: 184 mg/dL — ABNORMAL HIGH (ref 70–99)
POTASSIUM: 4.9 meq/L (ref 3.5–5.1)
Sodium: 143 mEq/L (ref 135–145)
TOTAL PROTEIN: 7.9 g/dL (ref 6.0–8.3)
Total Bilirubin: 0.4 mg/dL (ref 0.2–1.2)

## 2017-07-19 LAB — MICROALBUMIN / CREATININE URINE RATIO
CREATININE, U: 110.1 mg/dL
MICROALB UR: 4.8 mg/dL — AB (ref 0.0–1.9)
Microalb Creat Ratio: 4.4 mg/g (ref 0.0–30.0)

## 2017-07-19 LAB — HEMOGLOBIN A1C: Hgb A1c MFr Bld: 9 % — ABNORMAL HIGH (ref 4.6–6.5)

## 2017-07-19 LAB — LDL CHOLESTEROL, DIRECT: LDL DIRECT: 124 mg/dL

## 2017-07-19 MED ORDER — DOXYCYCLINE HYCLATE 100 MG PO TABS: 100.0000 mg | ORAL_TABLET | Freq: Two times a day (BID) | ORAL | 0 refills | Status: DC

## 2017-07-19 NOTE — Assessment & Plan Note (Signed)
Doxycycline per orders

## 2017-07-19 NOTE — Progress Notes (Signed)
Patient ID: Beth Shepard, female   DOB: 12-02-1955, 62 y.o.   MRN: [id number]    Subjective:  I acted as a Education administrator for Dr. Carollee Herter.  Beth Shepard, Beth Shepard   Patient ID: Beth Shepard, female    DOB: [birth year], 62 y.o.   MRN: [id number]  Chief Complaint  Patient presents with  . cyst back of neck    HPI  Patient is in today for cyst on back of neck.  Pt states it drained last week so it is smaller --- it has been drained by surgeon before a few years ago.     Patient Care Team: Carollee Herter, Alferd Apa, DO as PCP - General Atilano Median Gayleen Orem, MD as Referring Physician (Cardiology)   Past Medical History:  Diagnosis Date  . Anxiety   . Bilateral edema of lower extremity    Swelling in legs and ankles  . Congestive heart failure ([place])   . COPD (chronic obstructive pulmonary disease) ([place])   . Diabetes mellitus    type 2  . Gastric polyps   . GERD (gastroesophageal reflux disease)    chronic  . Hyperlipidemia   . Hypertension   . IBS (irritable bowel syndrome)   . LBBB (left bundle branch block)    Dr. Gwenith Spitz, [facility]  . Pneumonia    hx of  . PONV (postoperative nausea and vomiting)    Had N/V after having tubal ligation and D&C  . Rheumatoid arthritis ([place])   . Shingles     Past Surgical History:  Procedure Laterality Date  . CARDIAC CATHETERIZATION     No PCI  . CHOLECYSTECTOMY    . COLONOSCOPY  06/06/2005   normal   . DILATION AND CURETTAGE OF UTERUS    . ESOPHAGOGASTRODUODENOSCOPY  06/06/2005, 11/17/2013  . JOINT REPLACEMENT Right 10/24/2015   rt hip replacement  . KNEE ARTHROSCOPY Left   . TOTAL HIP ARTHROPLASTY Right 10/24/2015   Procedure: RIGHT TOTAL HIP ARTHROPLASTY ANTERIOR APPROACH;  Surgeon: Leandrew Koyanagi, MD;  Location: [facility];  Service: Orthopedics;  Laterality: Right;  . TUBAL LIGATION    . VAGINAL HYSTERECTOMY      Family History  Problem Relation Age of Onset  . Lymphoma Mother   . Heart disease Mother        chf, cabg x6  .  Hypertension Mother   . GER disease Mother   . Hiatal hernia Mother   . Gallstones Mother   . Cancer Sister        brain stage 4  . Heart disease Maternal Grandmother 24       MI  . Heart disease Maternal Grandfather   . Stroke Paternal Grandmother   . Hypertension Paternal Grandmother   . Stroke Paternal Grandfather   . Hypertension Paternal Grandfather   . Rheum arthritis Father   . Hypertension Daughter   . Colon cancer Neg Hx   . Esophageal cancer Neg Hx   . Pancreatic cancer Neg Hx   . Liver disease Neg Hx   . Kidney disease Neg Hx     Social History   Socioeconomic History  . Marital status: Widowed    Spouse name: Not on file  . Number of children: 2  . Years of education: Not on file  . Highest education level: Not on file  Social Needs  . Financial resource strain: Not on file  . Food insecurity - worry: Not on file  . Food insecurity - inability: Not on file  .  Transportation needs - medical: Not on file  . Transportation needs - non-medical: Not on file  Occupational History  . Occupation: Administive     Employer: [employer]  Tobacco Use  . Smoking status: Former Smoker    Years: 33.00    Types: Cigarettes    Last attempt to quit: 06/25/2002    Years since quitting: 15.0  . Smokeless tobacco: Never Used  Substance and Sexual Activity  . Alcohol use: Yes    Comment: rare  . Drug use: No  . Sexual activity: Not Currently    Partners: Male  Other Topics Concern  . Not on file  Social History Narrative   Limited exercise per cardiology    Outpatient Medications Prior to Visit  Medication Sig Dispense Refill  . Alogliptin Benzoate 25 MG TABS TAKE 1 TABLET BY MOUTH EVERY DAY 90 tablet 0  . ALPRAZolam (XANAX) 0.25 MG tablet TAKE 1 & 1/2 TABLETS BY MOUTH EACH MORNING 135 tablet 0  . aspirin 81 MG tablet Take 81 mg by mouth daily.    Marland Kitchen atorvastatin (LIPITOR) 20 MG tablet Take 1 tablet (20 mg total) by mouth daily. 90 tablet 1  .  carvedilol (COREG) 12.5 MG tablet Take 1 tablet (12.5 mg total) by mouth 2 (two) times daily. 180 tablet 1  . fenofibrate 160 MG tablet Take 1 tablet (160 mg total) by mouth daily. 30 tablet 2  . furosemide (LASIX) 20 MG tablet Take 1 tablet (20 mg total) by mouth daily. 90 tablet 0  . gabapentin (NEURONTIN) 300 MG capsule Take 300 mg by mouth at bedtime.    Marland Kitchen glimepiride (AMARYL) 2 MG tablet TAKE 1 TABLET (2 MG TOTAL) BY MOUTH DAILY BEFORE BREAKFAST. 90 tablet 2  . glucose blood (ONE TOUCH TEST STRIPS) test strip 1 each by Other route as needed. Reported on 11/02/2015    . Hyoscyamine Sulfate 0.375 MG TBCR Take 1 tablet (0.375 mg total) by mouth 2 (two) times daily. 60 tablet 1  . lisinopril (PRINIVIL,ZESTRIL) 20 MG tablet TAKE 1 TABLET BY MOUTH EVERY DAY 90 tablet 0  . ondansetron (ZOFRAN) 4 MG tablet TAKE 1-2 TABLETS BY MOUTH EVERY 8 HOURS AS NEEDED FOR NAUSEA 40 tablet 0  . pantoprazole (PROTONIX) 20 MG tablet TAKE 1 TABLET BY MOUTH EVERY DAY 90 tablet 0  . spironolactone (ALDACTONE) 25 MG tablet Take 1 tablet (25 mg total) by mouth every other day. Dr Ola Spurr    . traMADol (ULTRAM) 50 MG tablet TAKE 1-2 TABLETS BY MOUTH EVERY 6 HOURS AS NEEDED 60 tablet 0  . methocarbamol (ROBAXIN) 500 MG tablet Take 1 tablet (500 mg total) by mouth every 6 (six) hours as needed for muscle spasms. 30 tablet 2   No facility-administered medications prior to visit.     Allergies  Allergen Reactions  . Prednisone Palpitations and Other (See Comments)    Dose pack causes vomiting; Causes Gerd  . Codeine     REACTION: palpitation   . Ibuprofen Other (See Comments)    REACTION: questionable. Pt states that she can take this sometimes.    Review of Systems  Constitutional: Negative for chills, fever and malaise/fatigue.  HENT: Negative for congestion and hearing loss.   Eyes: Negative for discharge.  Respiratory: Negative for cough, sputum production and shortness of breath.   Cardiovascular: Negative  for chest pain, palpitations and leg swelling.  Gastrointestinal: Negative for abdominal pain, blood in stool, constipation, diarrhea, heartburn, nausea and vomiting.  Genitourinary: Negative  for dysuria, frequency, hematuria and urgency.  Musculoskeletal: Negative for back pain, falls and myalgias.  Skin: Negative for rash.  Neurological: Negative for dizziness, sensory change, loss of consciousness, weakness and headaches.  Endo/Heme/Allergies: Negative for environmental allergies. Does not bruise/bleed easily.  Psychiatric/Behavioral: Negative for depression and suicidal ideas. The patient is not nervous/anxious and does not have insomnia.        Objective:    Physical Exam  Neck:    Nursing note and vitals reviewed.   BP 128/76 (BP Location: Left Arm, Cuff Size: Large)   Pulse 85   Temp 98.5 F (36.9 C) (Oral)   Resp 16   Ht 5\' 2"  (1.575 m)   Wt 201 lb 3.2 oz (91.3 kg)   SpO2 98%   BMI 36.80 kg/m  Wt Readings from Last 3 Encounters:  07/19/17 201 lb 3.2 oz (91.3 kg)  04/05/17 206 lb (93.4 kg)  02/06/17 206 lb 12.8 oz (93.8 kg)   BP Readings from Last 3 Encounters:  07/19/17 128/76  05/23/17 (!) 113/59  04/05/17 120/78     Immunization History  Administered Date(s) Administered  . Influenza Whole 03/17/2009  . Influenza,inj,Quad PF,6+ Mos 09/07/2014, 04/09/2017  . PPD Test 10/26/2015  . Pneumococcal Polysaccharide-23 11/13/2012  . Tdap 11/13/2012    Health Maintenance  Topic Date Due  . HIV Screening  07/18/1968  . MAMMOGRAM  07/19/1971  . COLONOSCOPY  06/07/2015  . OPHTHALMOLOGY EXAM  09/07/2015  . HEMOGLOBIN A1C  05/19/2017  . PNEUMOCOCCAL POLYSACCHARIDE VACCINE (2) 11/13/2017  . TETANUS/TDAP  11/14/2022  . INFLUENZA VACCINE  Completed  . Hepatitis C Screening  Completed    Lab Results  Component Value Date   WBC 10.7 (H) 08/28/2016   HGB 11.2 (L) 08/28/2016   HCT 34.1 (L) 08/28/2016   PLT 371.0 08/28/2016   GLUCOSE 149 (H) 11/16/2016   CHOL  220 (H) 11/16/2016   TRIG 311.0 (H) 11/16/2016   HDL 40.00 11/16/2016   LDLDIRECT 112.0 11/16/2016   LDLCALC 115 (H) 04/06/2016   ALT 11 11/16/2016   AST 14 11/16/2016   NA 137 11/16/2016   K 4.9 11/16/2016   CL 103 11/16/2016   CREATININE 1.19 11/16/2016   BUN 24 (H) 11/16/2016   CO2 28 11/16/2016   TSH 0.890 10/21/2013   INR 0.99 10/14/2015   HGBA1C 8.1 (H) 11/16/2016   MICROALBUR 0.8 09/07/2014    Lab Results  Component Value Date   TSH 0.890 10/21/2013   Lab Results  Component Value Date   WBC 10.7 (H) 08/28/2016   HGB 11.2 (L) 08/28/2016   HCT 34.1 (L) 08/28/2016   MCV 83.8 08/28/2016   PLT 371.0 08/28/2016   Lab Results  Component Value Date   NA 137 11/16/2016   K 4.9 11/16/2016   CO2 28 11/16/2016   GLUCOSE 149 (H) 11/16/2016   BUN 24 (H) 11/16/2016   CREATININE 1.19 11/16/2016   BILITOT 0.4 11/16/2016   ALKPHOS 85 11/16/2016   AST 14 11/16/2016   ALT 11 11/16/2016   PROT 7.3 11/16/2016   ALBUMIN 4.2 11/16/2016   CALCIUM 9.6 11/16/2016   ANIONGAP 8 10/25/2015   GFR 48.64 (L) 11/16/2016   Lab Results  Component Value Date   CHOL 220 (H) 11/16/2016   Lab Results  Component Value Date   HDL 40.00 11/16/2016   Lab Results  Component Value Date   LDLCALC 115 (H) 04/06/2016   Lab Results  Component Value Date   TRIG 311.0 (H)  11/16/2016   Lab Results  Component Value Date   CHOLHDL 6 11/16/2016   Lab Results  Component Value Date   HGBA1C 8.1 (H) 11/16/2016         Assessment & Plan:   Problem List Items Addressed This Visit      Unprioritized   Infected sebaceous cyst of skin    Doxycycline per orders      Relevant Medications   doxycycline (VIBRA-TABS) 100 MG tablet   Other Relevant Orders   Ambulatory referral to General Surgery      I have discontinued [name] Jeffreys's methocarbamol. I am also having her start on doxycycline. Additionally, I am having her maintain her glucose blood, aspirin, gabapentin, spironolactone,  carvedilol, fenofibrate, ALPRAZolam, atorvastatin, Hyoscyamine Sulfate, glimepiride, lisinopril, pantoprazole, traMADol, ondansetron, furosemide, and Alogliptin Benzoate.  Meds ordered this encounter  Medications  . doxycycline (VIBRA-TABS) 100 MG tablet    Sig: Take 1 tablet (100 mg total) by mouth 2 (two) times daily.    Dispense:  20 tablet    Refill:  0    CMA served as scribe during this visit. History, Physical and Plan performed by medical provider. Documentation and orders reviewed and attested to.  Ann Held, DO

## 2017-07-19 NOTE — Patient Instructions (Signed)
Epidermal Cyst An epidermal cyst is sometimes called an epidermal inclusion cyst or an infundibular cyst. It is a sac made of skin tissue. The sac contains a substance called keratin. Keratin is a protein that is normally secreted through the hair follicles. When keratin becomes trapped in the top layer of skin (epidermis), it can form an epidermal cyst. Epidermal cysts are usually found on the face, neck, trunk, and genitals. These cysts are usually harmless (benign), and they may not cause symptoms unless they become infected. It is important not to pop epidermal cysts yourself. What are the causes? This condition may be caused by:  A blocked hair follicle.  A hair that curls and re-enters the skin instead of growing straight out of the skin (ingrown hair).  A blocked pore.  Irritated skin.  An injury to the skin.  Certain conditions that are passed along from parent to child (inherited).  Human papillomavirus (HPV).  What increases the risk? The following factors may make you more likely to develop an epidermal cyst:  Having acne.  Being overweight.  Wearing tight clothing.  What are the signs or symptoms? The only symptom of this condition may be a small, painless lump underneath the skin. When an epidermal cyst becomes infected, symptoms may include:  Redness.  Inflammation.  Tenderness.  Warmth.  Fever.  Keratin draining from the cyst. Keratin may look like a grayish-white, bad-smelling substance.  Pus draining from the cyst.  How is this diagnosed? This condition is diagnosed with a physical exam. In some cases, you may have a sample of tissue (biopsy) taken from your cyst to be examined under a microscope or tested for bacteria. You may be referred to a health care provider who specializes in skin care (dermatologist). How is this treated? In many cases, epidermal cysts go away on their own without treatment. If a cyst becomes infected, treatment may  include:  Opening and draining the cyst. After draining, minor surgery to remove the rest of the cyst may be done.  Antibiotic medicine to help prevent infection.  Injections of medicines (steroids) that help to reduce inflammation.  Surgery to remove the cyst. Surgery may be done if: ? The cyst becomes large. ? The cyst bothers you. ? There is a chance that the cyst could turn into cancer.  Follow these instructions at home:  Take over-the-counter and prescription medicines only as told by your health care provider.  If you were prescribed an antibiotic, use it as told by your health care provider. Do not stop using the antibiotic even if you start to feel better.  Keep the area around your cyst clean and dry.  Wear loose, dry clothing.  Do not try to pop your cyst.  Avoid touching your cyst.  Check your cyst every day for signs of infection.  Keep all follow-up visits as told by your health care provider. This is important. How is this prevented?  Wear clean, dry, clothing.  Avoid wearing tight clothing.  Keep your skin clean and dry. Shower or take baths every day.  Wash your body with a benzoyl peroxide wash when you shower or bathe. Contact a health care provider if:  Your cyst develops symptoms of infection.  Your condition is not improving or is getting worse.  You develop a cyst that looks different from other cysts you have had.  You have a fever. Get help right away if:  Redness spreads from the cyst into the surrounding area. This information is   not intended to replace advice given to you by your health care provider. Make sure you discuss any questions you have with your health care provider. Document Released: 05/12/2004 Document Revised: 02/08/2016 Document Reviewed: 04/13/2015 Elsevier Interactive Patient Education  2018 [employer]  

## 2017-07-23 ENCOUNTER — Telehealth: Payer: Self-pay | Admitting: *Deleted

## 2017-07-23 NOTE — Telephone Encounter (Signed)
Copied from [place] [id number]. Topic: Inquiry >> Jul 18, 2017 12:57 PM Cecelia Byars, NT wrote: Reason for CRM: Patient says office called and no message left please call back .

## 2017-07-24 ENCOUNTER — Other Ambulatory Visit: Payer: Self-pay

## 2017-07-24 DIAGNOSIS — E1151 Type 2 diabetes mellitus with diabetic peripheral angiopathy without gangrene: Secondary | ICD-10-CM

## 2017-07-24 DIAGNOSIS — E1165 Type 2 diabetes mellitus with hyperglycemia: Principal | ICD-10-CM

## 2017-07-24 DIAGNOSIS — IMO0002 Reserved for concepts with insufficient information to code with codable children: Secondary | ICD-10-CM

## 2017-07-24 MED ORDER — GLIMEPIRIDE 4 MG PO TABS: 4.0000 mg | ORAL_TABLET | Freq: Every day | ORAL | 5 refills | Status: DC

## 2017-08-07 ENCOUNTER — Other Ambulatory Visit: Payer: Self-pay | Admitting: Family Medicine

## 2017-08-07 DIAGNOSIS — I10 Essential (primary) hypertension: Secondary | ICD-10-CM

## 2017-08-07 DIAGNOSIS — E785 Hyperlipidemia, unspecified: Secondary | ICD-10-CM

## 2017-08-07 DIAGNOSIS — IMO0002 Reserved for concepts with insufficient information to code with codable children: Secondary | ICD-10-CM

## 2017-08-07 DIAGNOSIS — E1165 Type 2 diabetes mellitus with hyperglycemia: Principal | ICD-10-CM

## 2017-08-07 DIAGNOSIS — E1151 Type 2 diabetes mellitus with diabetic peripheral angiopathy without gangrene: Secondary | ICD-10-CM

## 2017-08-07 NOTE — Telephone Encounter (Signed)
Requesting: alprazolam and tramadol Contract:04/05/17 UDS:08/28/16 low risk Last Visit: 07/19/17 Next Visit: 08/19/17 Last Refill: alprazolam 04/05/17 Tramadol 06/20/17  Please Advise

## 2017-08-08 NOTE — Telephone Encounter (Signed)
We will be unable to do both meds together ---  We can only do one due to black box warning with using both together

## 2017-08-09 NOTE — Telephone Encounter (Signed)
Patient will take alprazolam.

## 2017-08-19 ENCOUNTER — Encounter: Payer: Self-pay | Admitting: Family Medicine

## 2017-08-19 ENCOUNTER — Encounter: Payer: 59 | Admitting: Family Medicine

## 2017-08-19 ENCOUNTER — Ambulatory Visit (INDEPENDENT_AMBULATORY_CARE_PROVIDER_SITE_OTHER): Payer: 59 | Admitting: Family Medicine

## 2017-08-19 VITALS — BP 110/64 | HR 84 | Temp 98.5°F | Resp 16 | Ht 61.81 in | Wt 204.0 lb

## 2017-08-19 DIAGNOSIS — E785 Hyperlipidemia, unspecified: Secondary | ICD-10-CM | POA: Diagnosis not present

## 2017-08-19 DIAGNOSIS — J020 Streptococcal pharyngitis: Secondary | ICD-10-CM | POA: Diagnosis not present

## 2017-08-19 DIAGNOSIS — E1165 Type 2 diabetes mellitus with hyperglycemia: Secondary | ICD-10-CM

## 2017-08-19 DIAGNOSIS — I10 Essential (primary) hypertension: Secondary | ICD-10-CM | POA: Diagnosis not present

## 2017-08-19 DIAGNOSIS — E1151 Type 2 diabetes mellitus with diabetic peripheral angiopathy without gangrene: Secondary | ICD-10-CM

## 2017-08-19 DIAGNOSIS — R05 Cough: Secondary | ICD-10-CM

## 2017-08-19 DIAGNOSIS — R059 Cough, unspecified: Secondary | ICD-10-CM

## 2017-08-19 DIAGNOSIS — IMO0002 Reserved for concepts with insufficient information to code with codable children: Secondary | ICD-10-CM

## 2017-08-19 LAB — POCT RAPID STREP A (OFFICE): RAPID STREP A SCREEN: POSITIVE — AB

## 2017-08-19 LAB — POC INFLUENZA A&B (BINAX/QUICKVUE)
INFLUENZA B, POC: NEGATIVE
Influenza A, POC: NEGATIVE

## 2017-08-19 MED ORDER — AMOXICILLIN 875 MG PO TABS: 875.0000 mg | ORAL_TABLET | Freq: Two times a day (BID) | ORAL | 0 refills | Status: DC

## 2017-08-19 MED ORDER — PROMETHAZINE-DM 6.25-15 MG/5ML PO SYRP: 5.0000 mL | ORAL_SOLUTION | Freq: Four times a day (QID) | ORAL | 0 refills | Status: DC | PRN

## 2017-08-19 NOTE — Progress Notes (Signed)
Subjective:  I acted as a Education administrator for [employer]. Beth Shepard, Beth Shepard   Patient ID: Beth Shepard, female    DOB: Jan 20, 1956, 62 y.o.   MRN: [id number]  Chief Complaint  Patient presents with  . Cough    HPI    Patient is in today for recurrent cough.   Chills, body aches and sore throat.x1 week  No fevers that she knows of.   Patient Care Team: Carollee Herter, Alferd Apa, DO as PCP - General Atilano Median Gayleen Orem, MD as Referring Physician (Cardiology)   Past Medical History:  Diagnosis Date  . Anxiety   . Bilateral edema of lower extremity    Swelling in legs and ankles  . Congestive heart failure ([place])   . COPD (chronic obstructive pulmonary disease) ([place])   . Diabetes mellitus    type 2  . Gastric polyps   . GERD (gastroesophageal reflux disease)    chronic  . Hyperlipidemia   . Hypertension   . IBS (irritable bowel syndrome)   . LBBB (left bundle branch block)    Dr. Gwenith Spitz, [facility]  . Pneumonia    hx of  . PONV (postoperative nausea and vomiting)    Had N/V after having tubal ligation and D&C  . Rheumatoid arthritis ([place])   . Shingles     Past Surgical History:  Procedure Laterality Date  . CARDIAC CATHETERIZATION     No PCI  . CHOLECYSTECTOMY    . COLONOSCOPY  06/06/2005   normal   . DILATION AND CURETTAGE OF UTERUS    . ESOPHAGOGASTRODUODENOSCOPY  06/06/2005, 11/17/2013  . JOINT REPLACEMENT Right 10/24/2015   rt hip replacement  . KNEE ARTHROSCOPY Left   . TOTAL HIP ARTHROPLASTY Right 10/24/2015   Procedure: RIGHT TOTAL HIP ARTHROPLASTY ANTERIOR APPROACH;  Surgeon: Leandrew Koyanagi, MD;  Location: [facility];  Service: Orthopedics;  Laterality: Right;  . TUBAL LIGATION    . VAGINAL HYSTERECTOMY      Family History  Problem Relation Age of Onset  . Lymphoma Mother   . Heart disease Mother        chf, cabg x6  . Hypertension Mother   . GER disease Mother   . Hiatal hernia Mother   . Gallstones Mother   . Cancer Sister        brain stage 4  .  Heart disease Maternal Grandmother 56       MI  . Heart disease Maternal Grandfather   . Stroke Paternal Grandmother   . Hypertension Paternal Grandmother   . Stroke Paternal Grandfather   . Hypertension Paternal Grandfather   . Rheum arthritis Father   . Hypertension Daughter   . Colon cancer Neg Hx   . Esophageal cancer Neg Hx   . Pancreatic cancer Neg Hx   . Liver disease Neg Hx   . Kidney disease Neg Hx     Social History   Socioeconomic History  . Marital status: Widowed    Spouse name: Not on file  . Number of children: 2  . Years of education: Not on file  . Highest education level: Not on file  Social Needs  . Financial resource strain: Not on file  . Food insecurity - worry: Not on file  . Food insecurity - inability: Not on file  . Transportation needs - medical: Not on file  . Transportation needs - non-medical: Not on file  Occupational History  . Occupation: Administive     Employer: [employer]  Tobacco Use  . Smoking status: Former Smoker    Years: 33.00    Types: Cigarettes    Last attempt to quit: 06/25/2002    Years since quitting: 15.1  . Smokeless tobacco: Never Used  Substance and Sexual Activity  . Alcohol use: Yes    Comment: rare  . Drug use: No  . Sexual activity: Not Currently    Partners: Male  Other Topics Concern  . Not on file  Social History Narrative   Limited exercise per cardiology    Outpatient Medications Prior to Visit  Medication Sig Dispense Refill  . Alogliptin Benzoate 25 MG TABS TAKE 1 TABLET BY MOUTH EVERY DAY 90 tablet 0  . ALPRAZolam (XANAX) 0.25 MG tablet TAKE 1 & 1/2 TABLETS BY MOUTH EACH MORNING 135 tablet 0  . aspirin 81 MG tablet Take 81 mg by mouth daily.    Marland Kitchen atorvastatin (LIPITOR) 20 MG tablet Take 1 tablet (20 mg total) by mouth daily. 90 tablet 1  . carvedilol (COREG) 12.5 MG tablet Take 1 tablet (12.5 mg total) by mouth 2 (two) times daily. 180 tablet 1  . doxycycline (VIBRA-TABS) 100 MG  tablet Take 1 tablet (100 mg total) by mouth 2 (two) times daily. 20 tablet 0  . fenofibrate 160 MG tablet Take 1 tablet (160 mg total) by mouth daily. 30 tablet 2  . furosemide (LASIX) 20 MG tablet Take 1 tablet (20 mg total) by mouth daily. 90 tablet 0  . glimepiride (AMARYL) 4 MG tablet Take 1 tablet (4 mg total) by mouth daily before breakfast. 30 tablet 5  . glucose blood (ONE TOUCH TEST STRIPS) test strip 1 each by Other route as needed. Reported on 11/02/2015    . Hyoscyamine Sulfate 0.375 MG TBCR Take 1 tablet (0.375 mg total) by mouth 2 (two) times daily. 60 tablet 1  . lisinopril (PRINIVIL,ZESTRIL) 20 MG tablet TAKE 1 TABLET BY MOUTH EVERY DAY 90 tablet 0  . ondansetron (ZOFRAN) 4 MG tablet TAKE 1-2 TABLETS BY MOUTH EVERY 8 HOURS AS NEEDED FOR NAUSEA 40 tablet 0  . pantoprazole (PROTONIX) 20 MG tablet TAKE 1 TABLET BY MOUTH EVERY DAY 90 tablet 0  . spironolactone (ALDACTONE) 25 MG tablet Take 1 tablet (25 mg total) by mouth every other day. Dr Ola Spurr    . traMADol (ULTRAM) 50 MG tablet TAKE 1-2 TABLETS BY MOUTH EVERY 6 HOURS AS NEEDED 60 tablet 0  . gabapentin (NEURONTIN) 300 MG capsule Take 300 mg by mouth at bedtime.     No facility-administered medications prior to visit.     Allergies  Allergen Reactions  . Prednisone Palpitations and Other (See Comments)    Dose pack causes vomiting; Causes Gerd  . Codeine     REACTION: palpitation   . Ibuprofen Other (See Comments)    REACTION: questionable. Pt states that she can take this sometimes.    Review of Systems  Constitutional: Positive for chills. Negative for fever and malaise/fatigue.  HENT: Positive for congestion. Negative for hearing loss and sinus pain.   Eyes: Negative for discharge.  Respiratory: Negative for cough, sputum production and shortness of breath.   Cardiovascular: Negative for chest pain, palpitations and leg swelling.  Gastrointestinal: Negative for abdominal pain, blood in stool, constipation,  diarrhea, heartburn, nausea and vomiting.  Genitourinary: Negative for dysuria, frequency, hematuria and urgency.  Musculoskeletal: Negative for back pain, falls and myalgias.  Skin: Negative for rash.  Neurological: Negative for dizziness, sensory change, loss of consciousness, weakness  and headaches.  Endo/Heme/Allergies: Negative for environmental allergies. Does not bruise/bleed easily.  Psychiatric/Behavioral: Negative for depression and suicidal ideas. The patient is not nervous/anxious and does not have insomnia.        Objective:    Physical Exam  Constitutional: She is oriented to person, place, and time. She appears well-developed and well-nourished.  HENT:  Right Ear: External ear normal.  Left Ear: External ear normal.  Mouth/Throat: Posterior oropharyngeal erythema present. No oropharyngeal exudate.  + PND + errythema  Eyes: Conjunctivae are normal. Right eye exhibits no discharge. Left eye exhibits no discharge.  Cardiovascular: Normal rate, regular rhythm and normal heart sounds.  No murmur heard. Pulmonary/Chest: Effort normal and breath sounds normal. No respiratory distress. She has no wheezes. She has no rales. She exhibits no tenderness.  Musculoskeletal: She exhibits no edema.  Lymphadenopathy:    She has cervical adenopathy.  Neurological: She is alert and oriented to person, place, and time.  Nursing note and vitals reviewed.   BP 110/64 (BP Location: Left Arm, Patient Position: Sitting, Cuff Size: Large)   Pulse 84   Temp 98.5 F (36.9 C) (Oral)   Resp 16   Ht 5' 1.81" (1.57 m)   Wt 204 lb (92.5 kg)   SpO2 98%   BMI 37.54 kg/m  Wt Readings from Last 3 Encounters:  08/19/17 204 lb (92.5 kg)  07/19/17 201 lb 3.2 oz (91.3 kg)  04/05/17 206 lb (93.4 kg)   BP Readings from Last 3 Encounters:  08/19/17 110/64  07/19/17 128/76  05/23/17 (!) 113/59     Immunization History  Administered Date(s) Administered  . Influenza Whole 03/17/2009  .  Influenza,inj,Quad PF,6+ Mos 09/07/2014, 04/09/2017  . PPD Test 10/26/2015  . Pneumococcal Polysaccharide-23 11/13/2012  . Tdap 11/13/2012    Health Maintenance  Topic Date Due  . HIV Screening  07/18/1968  . MAMMOGRAM  07/19/1971  . COLONOSCOPY  06/07/2015  . OPHTHALMOLOGY EXAM  09/07/2015  . HEMOGLOBIN A1C  01/16/2018  . TETANUS/TDAP  11/14/2022  . INFLUENZA VACCINE  Completed  . Hepatitis C Screening  Completed    Lab Results  Component Value Date   WBC 10.7 (H) 08/28/2016   HGB 11.2 (L) 08/28/2016   HCT 34.1 (L) 08/28/2016   PLT 371.0 08/28/2016   GLUCOSE 184 (H) 07/19/2017   CHOL 220 (H) 07/19/2017   TRIG 302.0 (H) 07/19/2017   HDL 37.70 (L) 07/19/2017   LDLDIRECT 124.0 07/19/2017   LDLCALC 115 (H) 04/06/2016   ALT 9 07/19/2017   AST 15 07/19/2017   NA 143 07/19/2017   K 4.9 07/19/2017   CL 105 07/19/2017   CREATININE 1.00 07/19/2017   BUN 16 07/19/2017   CO2 28 07/19/2017   TSH 0.890 10/21/2013   INR 0.99 10/14/2015   HGBA1C 9.0 (H) 07/19/2017   MICROALBUR 4.8 (H) 07/19/2017    Lab Results  Component Value Date   TSH 0.890 10/21/2013   Lab Results  Component Value Date   WBC 10.7 (H) 08/28/2016   HGB 11.2 (L) 08/28/2016   HCT 34.1 (L) 08/28/2016   MCV 83.8 08/28/2016   PLT 371.0 08/28/2016   Lab Results  Component Value Date   NA 143 07/19/2017   K 4.9 07/19/2017   CO2 28 07/19/2017   GLUCOSE 184 (H) 07/19/2017   BUN 16 07/19/2017   CREATININE 1.00 07/19/2017   BILITOT 0.4 07/19/2017   ALKPHOS 97 07/19/2017   AST 15 07/19/2017   ALT 9 07/19/2017  PROT 7.9 07/19/2017   ALBUMIN 4.2 07/19/2017   CALCIUM 9.8 07/19/2017   ANIONGAP 8 10/25/2015   GFR 59.33 (L) 07/19/2017   Lab Results  Component Value Date   CHOL 220 (H) 07/19/2017   Lab Results  Component Value Date   HDL 37.70 (L) 07/19/2017   Lab Results  Component Value Date   LDLCALC 115 (H) 04/06/2016   Lab Results  Component Value Date   TRIG 302.0 (H) 07/19/2017   Lab  Results  Component Value Date   CHOLHDL 6 07/19/2017   Lab Results  Component Value Date   HGBA1C 9.0 (H) 07/19/2017         Assessment & Plan:   Problem List Items Addressed This Visit      Unprioritized   DM (diabetes mellitus) type II uncontrolled, periph vascular disorder ([place])    Check labs      Relevant Orders   Lipid panel   Hemoglobin A1c   Comprehensive metabolic panel   Microalbumin / creatinine urine ratio   Essential hypertension    Well controlled, no changes to meds. Encouraged heart healthy diet such as the DASH diet and exercise as tolerated.       Hyperlipidemia LDL goal <70    Tolerating statin, encouraged heart healthy diet, avoid trans fats, minimize simple carbs and saturated fats. Increase exercise as tolerated      Relevant Orders   Lipid panel   Hemoglobin A1c   Comprehensive metabolic panel   Microalbumin / creatinine urine ratio    Other Visit Diagnoses    Cough    -  Primary   Relevant Medications   promethazine-dextromethorphan (PROMETHAZINE-DM) 6.25-15 MG/5ML syrup   Other Relevant Orders   POCT rapid strep A (Completed)   POC Influenza A&B(BINAX/QUICKVUE) (Completed)   Strep throat       Relevant Medications   amoxicillin (AMOXIL) 875 MG tablet   Other Relevant Orders   POCT rapid strep A (Completed)      I am having Beth Shepard start on promethazine-dextromethorphan and amoxicillin. I am also having her maintain her glucose blood, aspirin, gabapentin, spironolactone, carvedilol, fenofibrate, atorvastatin, Hyoscyamine Sulfate, lisinopril, pantoprazole, ondansetron, furosemide, Alogliptin Benzoate, doxycycline, glimepiride, traMADol, and ALPRAZolam.  Meds ordered this encounter  Medications  . promethazine-dextromethorphan (PROMETHAZINE-DM) 6.25-15 MG/5ML syrup    Sig: Take 5 mLs by mouth 4 (four) times daily as needed.    Dispense:  118 mL    Refill:  0  . amoxicillin (AMOXIL) 875 MG tablet    Sig: Take 1 tablet (875 mg  total) by mouth 2 (two) times daily.    Dispense:  20 tablet    Refill:  0    CMA served as scribe during this visit. History, Physical and Plan performed by medical provider. Documentation and orders reviewed and attested to.  Ann Held, DO

## 2017-08-19 NOTE — Patient Instructions (Signed)
Strep Throat Strep throat is a bacterial infection of the throat. Your health care provider may call the infection tonsillitis or pharyngitis, depending on whether there is swelling in the tonsils or at the back of the throat. Strep throat is most common during the cold months of the year in children who are 5-64 years of age, but it can happen during any season in people of any age. This infection is spread from person to person (contagious) through coughing, sneezing, or close contact. What are the causes? Strep throat is caused by the bacteria called Streptococcus pyogenes. What increases the risk? This condition is more likely to develop in:  People who spend time in crowded places where the infection can spread easily.  People who have close contact with someone who has strep throat.  What are the signs or symptoms? Symptoms of this condition include:  Fever or chills.  Redness, swelling, or pain in the tonsils or throat.  Pain or difficulty when swallowing.  White or yellow spots on the tonsils or throat.  Swollen, tender glands in the neck or under the jaw.  Red rash all over the body (rare).  How is this diagnosed? This condition is diagnosed by performing a rapid strep test or by taking a swab of your throat (throat culture test). Results from a rapid strep test are usually ready in a few minutes, but throat culture test results are available after one or two days. How is this treated? This condition is treated with antibiotic medicine. Follow these instructions at home: Medicines  Take over-the-counter and prescription medicines only as told by your health care provider.  Take your antibiotic as told by your health care provider. Do not stop taking the antibiotic even if you start to feel better.  Have family members who also have a sore throat or fever tested for strep throat. They may need antibiotics if they have the strep infection. Eating and drinking  Do not  share food, drinking cups, or personal items that could cause the infection to spread to other people.  If swallowing is difficult, try eating soft foods until your sore throat feels better.  Drink enough fluid to keep your urine clear or pale yellow. General instructions  Gargle with a salt-water mixture 3-4 times per day or as needed. To make a salt-water mixture, completely dissolve -1 tsp of salt in 1 cup of warm water.  Make sure that all household members wash their hands well.  Get plenty of rest.  Stay home from school or work until you have been taking antibiotics for 24 hours.  Keep all follow-up visits as told by your health care provider. This is important. Contact a health care provider if:  The glands in your neck continue to get bigger.  You develop a rash, cough, or earache.  You cough up a thick liquid that is green, yellow-brown, or bloody.  You have pain or discomfort that does not get better with medicine.  Your problems seem to be getting worse rather than better.  You have a fever. Get help right away if:  You have new symptoms, such as vomiting, severe headache, stiff or painful neck, chest pain, or shortness of breath.  You have severe throat pain, drooling, or changes in your voice.  You have swelling of the neck, or the skin on the neck becomes red and tender.  You have signs of dehydration, such as fatigue, dry mouth, and decreased urination.  You become increasingly sleepy, or   you cannot wake up completely.  Your joints become red or painful. This information is not intended to replace advice given to you by your health care provider. Make sure you discuss any questions you have with your health care provider. Document Released: 06/08/2000 Document Revised: 02/08/2016 Document Reviewed: 10/04/2014 Elsevier Interactive Patient Education  2018 Elsevier Inc.  

## 2017-08-19 NOTE — Assessment & Plan Note (Signed)
Well controlled, no changes to meds. Encouraged heart healthy diet such as the DASH diet and exercise as tolerated.  °

## 2017-08-19 NOTE — Assessment & Plan Note (Signed)
Check labs 

## 2017-08-19 NOTE — Assessment & Plan Note (Signed)
Tolerating statin, encouraged heart healthy diet, avoid trans fats, minimize simple carbs and saturated fats. Increase exercise as tolerated 

## 2017-08-22 ENCOUNTER — Ambulatory Visit: Payer: Self-pay

## 2017-08-22 NOTE — Telephone Encounter (Signed)
States diarrhea started Tuesday after taking her antibiotic.Reports she has irritable bowel as well.States she is incontinent with the diarrhea - it is so explosive. Does not have an appetite. States she would like something called in for this. Would also like another cough medicine called in. Still taking the Amoxil.  Reason for Disposition . SEVERE diarrhea (e.g., 7 or more times / day more than normal)  Answer Assessment - Initial Assessment Questions 1. DIARRHEA SEVERITY: "How bad is the diarrhea?" "How many extra stools have you had in the past 24 hours than normal?"    - MILD: Few loose or mushy BMs; increase of 1-3 stools over normal daily number of stools; mild increase in ostomy output.   - MODERATE: Increase of 4-6 stools daily over normal; moderate increase in ostomy output.   - SEVERE (or Worst Possible): Increase of 7 or more stools daily over normal; moderate increase in ostomy output; incontinence.     7 2. ONSET: "When did the diarrhea begin?"      Started Tuesday 3. BM CONSISTENCY: "How loose or watery is the diarrhea?"      Watery 4. VOMITING: "Are you also vomiting?" If so, ask: "How many times in the past 24 hours?"      No 5. ABDOMINAL PAIN: "Are you having any abdominal pain?" If yes: "What does it feel like?" (e.g., crampy, dull, intermittent, constant)      Cramping 6. ABDOMINAL PAIN SEVERITY: If present, ask: "How bad is the pain?"  (e.g., Scale 1-10; mild, moderate, or severe)    - MILD (1-3): doesn't interfere with normal activities, abdomen soft and not tender to touch     - MODERATE (4-7): interferes with normal activities or awakens from sleep, tender to touch     - SEVERE (8-10): excruciating pain, doubled over, unable to do any normal activities       Moderate 7. ORAL INTAKE: If vomiting, "Have you been able to drink liquids?" "How much fluids have you had in the past 24 hours?"     Drinking 8. HYDRATION: "Any signs of dehydration?" (e.g., dry mouth [not just  dry lips], too weak to stand, dizziness, new weight loss) "When did you last urinate?"     Yes 9. EXPOSURE: "Have you traveled to a foreign country recently?" "Have you been exposed to anyone with diarrhea?" "Could you have eaten any food that was spoiled?"     No 10. OTHER SYMPTOMS: "Do you have any other symptoms?" (e.g., fever, blood in stool)       No, but had chills 11. PREGNANCY: "Is there any chance you are pregnant?" "When was your last menstrual period?"       No  Protocols used: DIARRHEA-A-AH

## 2017-08-22 NOTE — Telephone Encounter (Signed)
If she is still having the diarrhea she should stop it and have her pick up a kit to have CDiff checked. Can send her in Hydromet 1 tsp po qhs prn cough, disp 120 ML. Cannot give anything for appetite. Just drink clear fluids and eat simple carbs. Take one day off of antibiotics to see if she improves then treat with a different antibiotic Bactrim DS 1 tab po bid x 10 days.

## 2017-08-22 NOTE — Telephone Encounter (Signed)
Please advise 

## 2017-08-23 ENCOUNTER — Telehealth: Payer: Self-pay | Admitting: *Deleted

## 2017-08-23 ENCOUNTER — Other Ambulatory Visit: Payer: Self-pay | Admitting: Family Medicine

## 2017-08-23 DIAGNOSIS — J209 Acute bronchitis, unspecified: Secondary | ICD-10-CM

## 2017-08-23 MED ORDER — HYDROCODONE-HOMATROPINE 5-1.5 MG/5ML PO SYRP: 5.0000 mL | ORAL_SOLUTION | Freq: Three times a day (TID) | ORAL | 0 refills | Status: DC | PRN

## 2017-08-23 MED ORDER — SULFAMETHOXAZOLE-TRIMETHOPRIM 800-160 MG PO TABS: 1.0000 | ORAL_TABLET | Freq: Two times a day (BID) | ORAL | 0 refills | Status: DC

## 2017-08-23 NOTE — Addendum Note (Signed)
Addended by: Bartholome Bill on: 08/23/2017 04:23 PM   Modules accepted: Orders

## 2017-08-23 NOTE — Telephone Encounter (Signed)
See phone note

## 2017-08-23 NOTE — Telephone Encounter (Signed)
Pt.notified

## 2017-08-23 NOTE — Telephone Encounter (Signed)
I sent in the Hydromet but make sure to let her know that the hydrocodone in the cough syrup is related to codeine which caused her palpitations in past so if she has that reaction again she should stop the med and take some benadryl

## 2017-08-23 NOTE — Telephone Encounter (Signed)
Copied from [place] [phone]. Topic: Inquiry >> Jul 18, 2017 12:57 PM Cecelia Byars, NT wrote: Reason for CRM: Patient says office called and no message left please call back .  >> Aug 23, 2017 11:31 AM Oneta Rack wrote: Relation to pt: self  Call back [phone]   Reason for call:  Patient checking on the status of covering physican recommendation, chart reflects Dr. Charlett Blake 08/22/17 telephone note, please advise

## 2017-08-23 NOTE — Addendum Note (Signed)
Addended by: Bartholome Bill on: 08/23/2017 03:48 PM   Modules accepted: Orders

## 2017-08-24 ENCOUNTER — Other Ambulatory Visit: Payer: Self-pay | Admitting: Family Medicine

## 2017-08-25 ENCOUNTER — Other Ambulatory Visit: Payer: Self-pay | Admitting: Family Medicine

## 2017-08-25 DIAGNOSIS — IMO0002 Reserved for concepts with insufficient information to code with codable children: Secondary | ICD-10-CM

## 2017-08-25 DIAGNOSIS — E1151 Type 2 diabetes mellitus with diabetic peripheral angiopathy without gangrene: Secondary | ICD-10-CM

## 2017-08-25 DIAGNOSIS — E1165 Type 2 diabetes mellitus with hyperglycemia: Principal | ICD-10-CM

## 2017-08-25 DIAGNOSIS — E785 Hyperlipidemia, unspecified: Secondary | ICD-10-CM

## 2017-08-25 DIAGNOSIS — I10 Essential (primary) hypertension: Secondary | ICD-10-CM

## 2017-09-18 ENCOUNTER — Other Ambulatory Visit: Payer: Self-pay | Admitting: *Deleted

## 2017-09-18 DIAGNOSIS — IMO0002 Reserved for concepts with insufficient information to code with codable children: Secondary | ICD-10-CM

## 2017-09-18 DIAGNOSIS — E1151 Type 2 diabetes mellitus with diabetic peripheral angiopathy without gangrene: Secondary | ICD-10-CM

## 2017-09-18 DIAGNOSIS — E1165 Type 2 diabetes mellitus with hyperglycemia: Principal | ICD-10-CM

## 2017-09-18 MED ORDER — GLIMEPIRIDE 4 MG PO TABS
4.0000 mg | ORAL_TABLET | Freq: Every day | ORAL | 0 refills | Status: DC
Start: 2017-09-18 — End: 2017-12-25

## 2017-10-09 ENCOUNTER — Telehealth (INDEPENDENT_AMBULATORY_CARE_PROVIDER_SITE_OTHER): Payer: Self-pay | Admitting: Orthopaedic Surgery

## 2017-10-09 NOTE — Telephone Encounter (Signed)
Patient called stating that she think her bursitis in her right leg has come back, she can barely walk and can't sleep at night. She was wondering what she could do to help alleviate the pain and also if she could be prescribed some pain medication to hold her over til her appointment on the 29th. CB # [id number] or [phone]

## 2017-10-10 ENCOUNTER — Other Ambulatory Visit (INDEPENDENT_AMBULATORY_CARE_PROVIDER_SITE_OTHER): Payer: Self-pay

## 2017-10-10 MED ORDER — NAPROXEN 500 MG PO TABS: 500.0000 mg | ORAL_TABLET | Freq: Two times a day (BID) | ORAL | 0 refills | Status: DC

## 2017-10-10 NOTE — Telephone Encounter (Signed)
Faxed into pharm  

## 2017-10-10 NOTE — Telephone Encounter (Signed)
Please advise. Appt scheduled for 10/21/17.

## 2017-10-10 NOTE — Telephone Encounter (Signed)
Naproxen 500 mg bid #30

## 2017-10-15 ENCOUNTER — Telehealth: Payer: Self-pay | Admitting: *Deleted

## 2017-10-15 NOTE — Telephone Encounter (Signed)
Copied from [place] [phone]. Topic: Referral - Request >> Oct 14, 2017  3:24 PM Scherrie Gerlach wrote: Reason for CRM: pt has scheduled her cpe on 6/25, and would like to have her mammogram done that same afternoon. Does pt need a referral to have done there, or just [facility]?   Pt has not had a mammogram in several years. Last order from 2014 expired.

## 2017-10-17 NOTE — Telephone Encounter (Signed)
Advised patient that she can just call to make an appt .

## 2017-10-18 ENCOUNTER — Encounter: Payer: 59 | Admitting: Family Medicine

## 2017-10-21 ENCOUNTER — Encounter (INDEPENDENT_AMBULATORY_CARE_PROVIDER_SITE_OTHER): Payer: Self-pay | Admitting: Orthopaedic Surgery

## 2017-10-21 ENCOUNTER — Ambulatory Visit (INDEPENDENT_AMBULATORY_CARE_PROVIDER_SITE_OTHER): Payer: 59 | Admitting: Orthopaedic Surgery

## 2017-10-21 ENCOUNTER — Ambulatory Visit (INDEPENDENT_AMBULATORY_CARE_PROVIDER_SITE_OTHER): Payer: Self-pay

## 2017-10-21 DIAGNOSIS — M1611 Unilateral primary osteoarthritis, right hip: Secondary | ICD-10-CM | POA: Diagnosis not present

## 2017-10-21 DIAGNOSIS — S76311A Strain of muscle, fascia and tendon of the posterior muscle group at thigh level, right thigh, initial encounter: Secondary | ICD-10-CM | POA: Diagnosis not present

## 2017-10-21 MED ORDER — TIZANIDINE HCL 2 MG PO CAPS: 2.0000 mg | ORAL_CAPSULE | Freq: Two times a day (BID) | ORAL | 1 refills | Status: DC | PRN

## 2017-10-21 MED ORDER — MELOXICAM 7.5 MG PO TABS: 7.5000 mg | ORAL_TABLET | Freq: Every day | ORAL | 1 refills | Status: DC | PRN

## 2017-10-21 NOTE — Progress Notes (Signed)
Office Visit Note   Patient: Beth Shepard           Date of Birth: 20-Oct-1955           MRN: [id number] Visit Date: 10/21/2017              Requested by: [address] [place], [place] [address] [place], [place] [address] PCP: Carollee Herter, Alferd Apa, DO   Assessment & Plan: Visit Diagnoses:  1. Partial hamstring tear, right, initial encounter   2. Primary osteoarthritis of right hip     Plan: Impression is right hamstring tear at the ischial tuberosity.  Today, I have called in a prescription for Mobic as well as Zanaflex.  We will send her to outpatient physical therapy.  We are hopeful this will get some better.  She will follow-up with [place] in 6 weeks time for repeat evaluation.  Call with concerns or questions in the meantime.  Follow-Up Instructions: Return in about 6 weeks (around 12/02/2017).   Orders:  Orders Placed This Encounter  Procedures  . XR HIP UNILAT W OR W/O PELVIS 2-3 VIEWS RIGHT   Meds ordered this encounter  Medications  . meloxicam (MOBIC) 7.5 MG tablet    Sig: Take 1 tablet (7.5 mg total) by mouth daily as needed for pain.    Dispense:  30 tablet    Refill:  1  . tizanidine (ZANAFLEX) 2 MG capsule    Sig: Take 1 capsule (2 mg total) by mouth 2 (two) times daily as needed for muscle spasms.    Dispense:  30 capsule    Refill:  1      Procedures: No procedures performed   Clinical Data: No additional findings.   Subjective: Chief Complaint  Patient presents with  . Right Hip - Pain    HPI patient is a pleasant 62 year old female who presents to our clinic today with a new injury to her right leg.  Approximately 4 weeks ago, she was making a bed when she went to squat and felt a pop to the bottom of her right butt cheek.  She initially had excruciating pain to the area radiating down the back of her upper leg but this  has minimally improved.  Pain is worse with sitting.  Minimally better with standing.  She has been taking naproxen  with minimal relief of symptoms.  No numbness tingling burning.  She did not notice any initial bruising but then again says she never looked.  Of note, she is status post right total hip replacement, 10/24/2015.  No groin pain.    Review of Systems as detailed in HPI.  All are reviewed and are negative.   Objective: Vital Signs: There were no vitals taken for this visit.  Physical Exam well-developed well-nourished female no acute distress.  Alert and oriented x3.  Ortho Exam examination of her right hip reveals pain with hip flexion and very weak with resistance.  Negative logroll.  Marked pain at the ischial tuberosity and with knee and hip flexion and extension.  No ecchymosis.  She is neurovascularly intact distally  Specialty Comments:  No specialty comments available.  Imaging: Xr Hip Unilat W Or W/o Pelvis 2-3 Views Right  Result Date: 10/21/2017 X-rays show well-seated prosthesis    PMFS History: Patient Active Problem List   Diagnosis Date Noted  . Infected sebaceous cyst of skin 07/19/2017  . Fibromyalgia 04/05/2017  . Chronic pain syndrome 04/05/2017  . Pain in  both lower extremities 02/06/2017  . Chronic venous insufficiency 02/06/2017  . Cellulitis of lower extremity 08/28/2016  . Pain in right ankle and joints of right foot 08/09/2016  . Trochanteric bursitis, right hip 05/03/2016  . Chronic kidney disease (CKD) stage G3a/A1, moderately decreased glomerular filtration rate (GFR) between 45-59 mL/min/1.73 square meter and albuminuria creatinine ratio less than 30 mg/g (HCC) 04/07/2016  . Primary osteoarthritis of right hip 10/24/2015  . Hip joint replacement status 10/24/2015  . Chest pain 08/25/2015  . Right hip pain 08/25/2015  . Chest pain, precordial 08/10/2015  . Cardiomyopathy ([place]) 01/18/2015  . Block, bundle branch, left 01/18/2015  . Nausea alone 12/17/2013  . Diarrhea 11/19/2013  . Bloating 11/19/2013  . Obesity (BMI 30-39.9) 10/21/2013  . Multiple  joint pain 11/13/2012  . MYALGIA 08/29/2010  . ABDOMINAL PAIN OTHER SPECIFIED SITE 08/22/2009  . OSTEOARTHROS UNSPEC WHETHER GEN/LOC UNSPEC SITE 03/17/2009  . Cellulitis and abscess of face 12/17/2008  . Disturbance of skin sensation 12/17/2008  . ACUTE BRONCHITIS 05/07/2008  . Anxiety 12/04/2007  . SINUSITIS, ACUTE NOS 12/19/2006  . DM (diabetes mellitus) type II uncontrolled, periph vascular disorder ([place]) 10/08/2006  . Hyperlipidemia LDL goal <70 10/08/2006  . Essential hypertension 10/08/2006  . COPD 10/08/2006  . GERD 10/08/2006  . Other postprocedural status(V45.89) 10/08/2006   Past Medical History:  Diagnosis Date  . Anxiety   . Bilateral edema of lower extremity    Swelling in legs and ankles  . Congestive heart failure ([place])   . COPD (chronic obstructive pulmonary disease) ([place])   . Diabetes mellitus    type 2  . Gastric polyps   . GERD (gastroesophageal reflux disease)    chronic  . Hyperlipidemia   . Hypertension   . IBS (irritable bowel syndrome)   . LBBB (left bundle branch block)    Dr. Gwenith Spitz, [facility]  . Pneumonia    hx of  . PONV (postoperative nausea and vomiting)    Had N/V after having tubal ligation and D&C  . Rheumatoid arthritis ([place])   . Shingles     Family History  Problem Relation Age of Onset  . Lymphoma Mother   . Heart disease Mother        chf, cabg x6  . Hypertension Mother   . GER disease Mother   . Hiatal hernia Mother   . Gallstones Mother   . Cancer Sister        brain stage 4  . Heart disease Maternal Grandmother 38       MI  . Heart disease Maternal Grandfather   . Stroke Paternal Grandmother   . Hypertension Paternal Grandmother   . Stroke Paternal Grandfather   . Hypertension Paternal Grandfather   . Rheum arthritis Father   . Hypertension Daughter   . Colon cancer Neg Hx   . Esophageal cancer Neg Hx   . Pancreatic cancer Neg Hx   . Liver disease Neg Hx   . Kidney disease Neg Hx     Past  Surgical History:  Procedure Laterality Date  . CARDIAC CATHETERIZATION     No PCI  . CHOLECYSTECTOMY    . COLONOSCOPY  06/06/2005   normal   . DILATION AND CURETTAGE OF UTERUS    . ESOPHAGOGASTRODUODENOSCOPY  06/06/2005, 11/17/2013  . JOINT REPLACEMENT Right 10/24/2015   rt hip replacement  . KNEE ARTHROSCOPY Left   . TOTAL HIP ARTHROPLASTY Right 10/24/2015   Procedure: RIGHT TOTAL HIP ARTHROPLASTY ANTERIOR APPROACH;  Surgeon: Leandrew Koyanagi, MD;  Location: [facility];  Service: Orthopedics;  Laterality: Right;  . TUBAL LIGATION    . VAGINAL HYSTERECTOMY     Social History   Occupational History  . Occupation: Administive     Employer: [employer]  Tobacco Use  . Smoking status: Former Smoker    Years: 33.00    Types: Cigarettes    Last attempt to quit: 06/25/2002    Years since quitting: 15.3  . Smokeless tobacco: Never Used  Substance and Sexual Activity  . Alcohol use: Yes    Comment: rare  . Drug use: No  . Sexual activity: Not Currently    Partners: Male

## 2017-10-22 ENCOUNTER — Telehealth (INDEPENDENT_AMBULATORY_CARE_PROVIDER_SITE_OTHER): Payer: Self-pay | Admitting: Physician Assistant

## 2017-10-22 NOTE — Telephone Encounter (Signed)
Patient called asked for a call back concerning the medication that was prescribed for her. The number to contact patient is [phone] or [phone] work

## 2017-10-23 NOTE — Telephone Encounter (Signed)
Called patient no answer. LMOM whats going on with Rx ?

## 2017-10-23 NOTE — Telephone Encounter (Signed)
Patients questions is that the rx for pain is not working. She said she only takes 1 a day and the muscle relaxer 2 x a day. She is wondering if it is ok to take both of them at the same time. She is also wondering about a steroid medication that she can take, anything besides prednisone pack. Also, if she should stop taking pain medication and go back to taking naproxen since that seemed to help more. Her CB # [phone]

## 2017-10-24 ENCOUNTER — Other Ambulatory Visit (INDEPENDENT_AMBULATORY_CARE_PROVIDER_SITE_OTHER): Payer: Self-pay

## 2017-10-24 MED ORDER — PREDNISONE 10 MG (21) PO TBPK: ORAL_TABLET | ORAL | 0 refills | Status: DC

## 2017-10-24 NOTE — Telephone Encounter (Signed)
It's ok to take together.  She can try a medrol dose pak

## 2017-10-24 NOTE — Telephone Encounter (Signed)
Patient called back this afternoon and I advised her about the message from Dr. Erlinda Hong.  She wanted to know about the steroid.  Please advise PT.  CB#[phone].  Thank you.

## 2017-10-24 NOTE — Telephone Encounter (Signed)
Please advise 

## 2017-10-24 NOTE — Telephone Encounter (Signed)
FAXED INTO PhARM

## 2017-11-06 IMAGING — US US EXTREM LOW VENOUS BILAT
1 series · 13 of 24 positions shown · non-contrast
Comparison: None available.

CLINICAL DATA: Initial evaluation for bilateral calf pain with
redness and swelling for 4 weeks.



[Series 1: us extrem low venous bilat · 0.08mm/px · 13 of 49 slices shown]
[im 1/49]
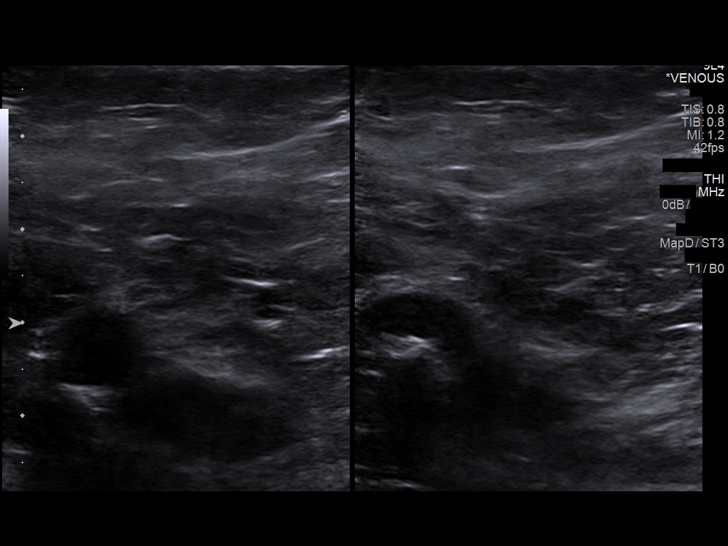
[im 5/49]
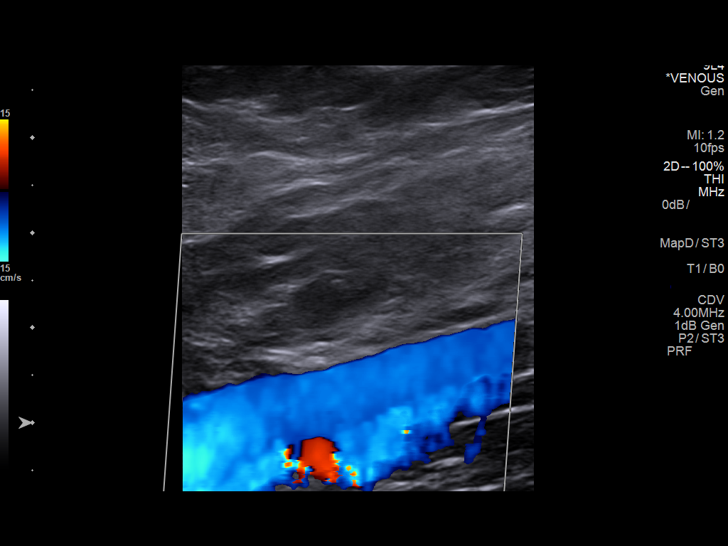
[im 9/49]
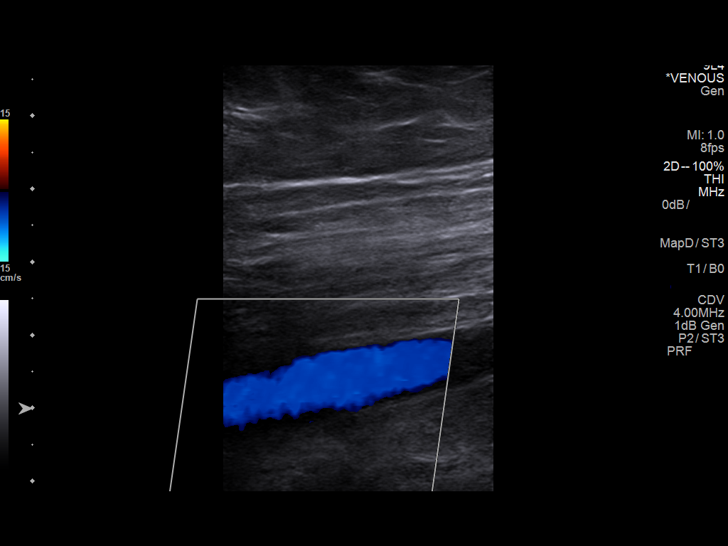
[im 13/49]
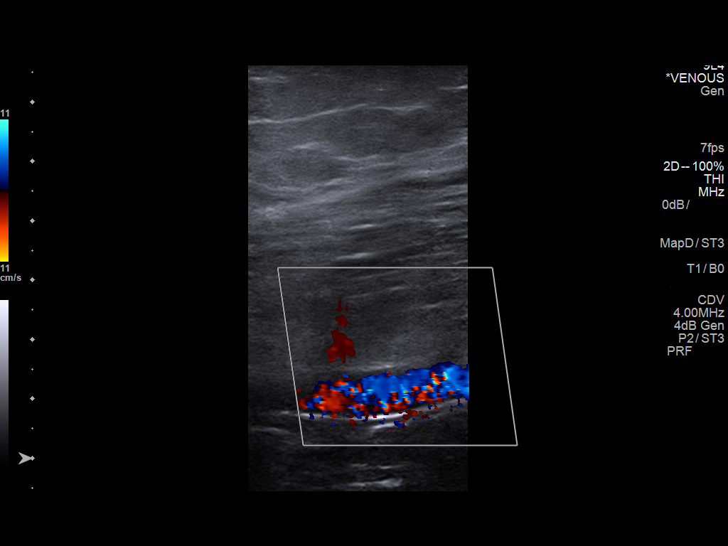
[im 17/49]
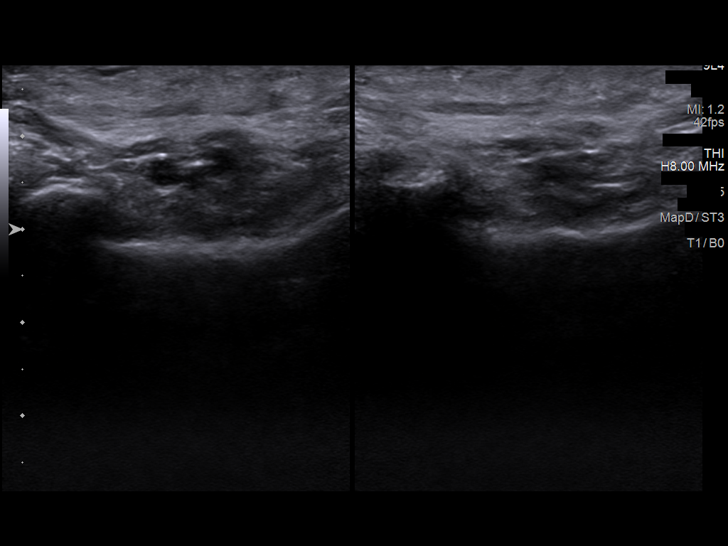
[im 21/49]
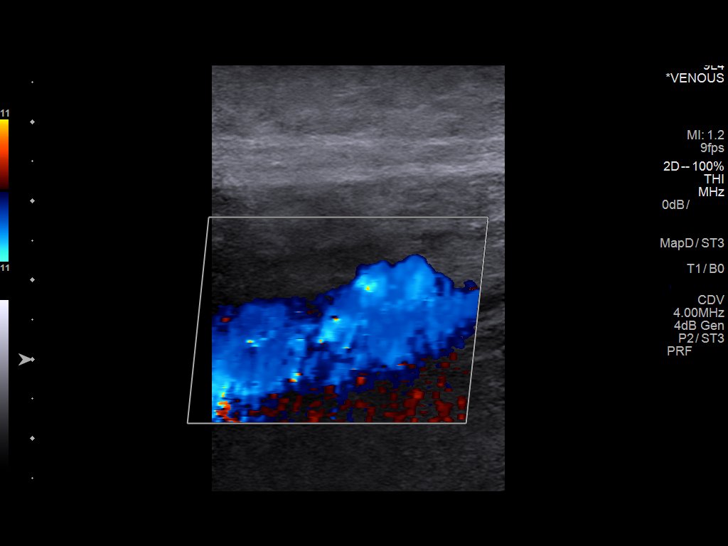
[im 26/49]
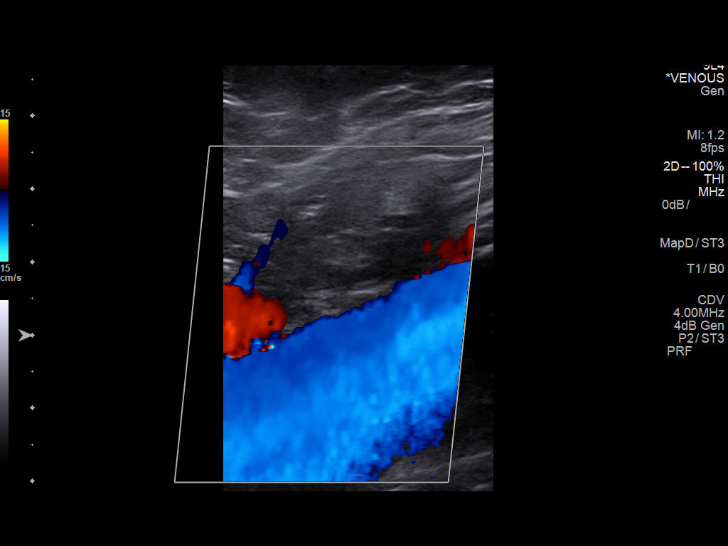
[im 28/49]
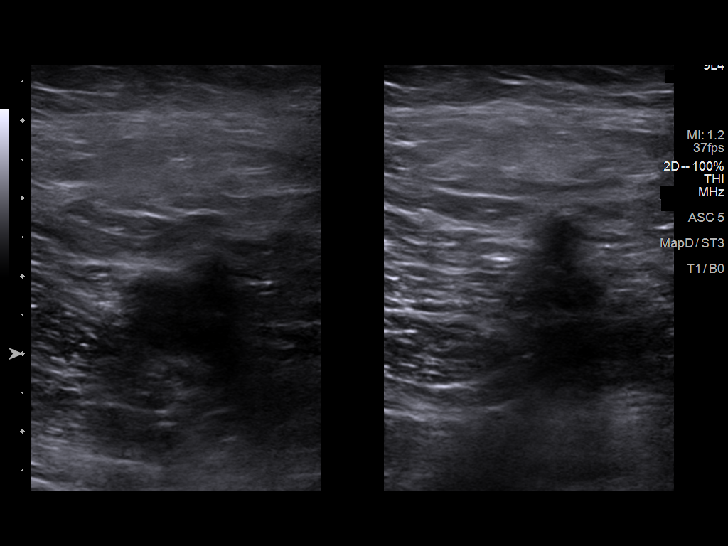
[im 32/49]
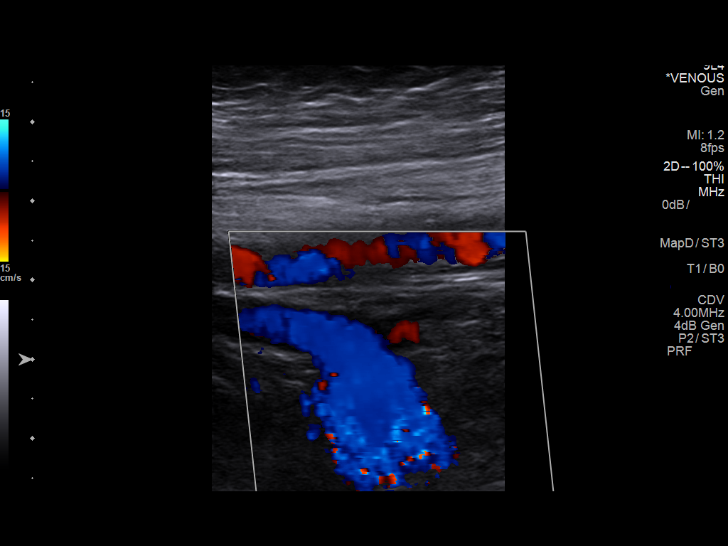
[im 36/49]
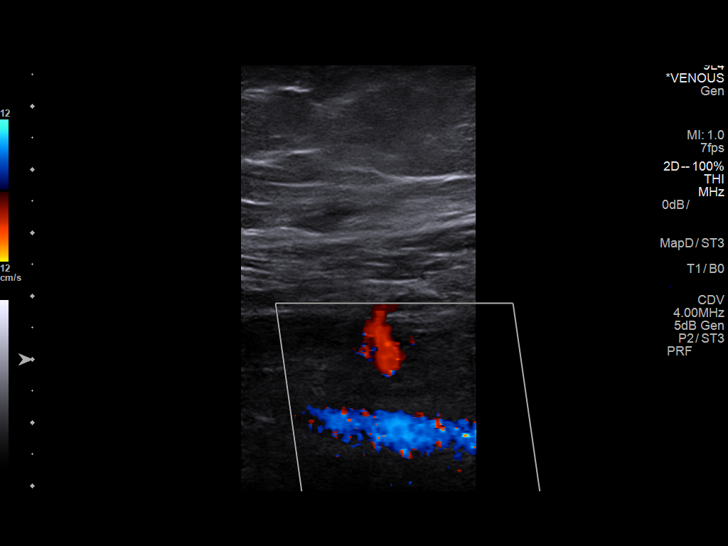
[im 40/49]
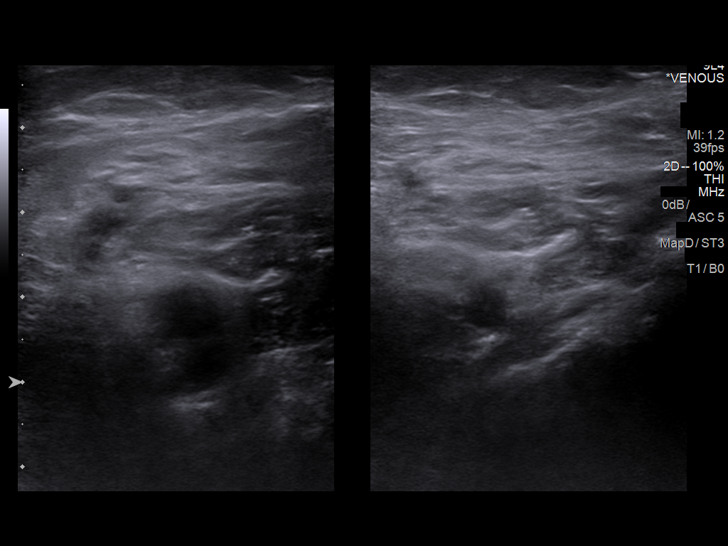
[im 44/49]
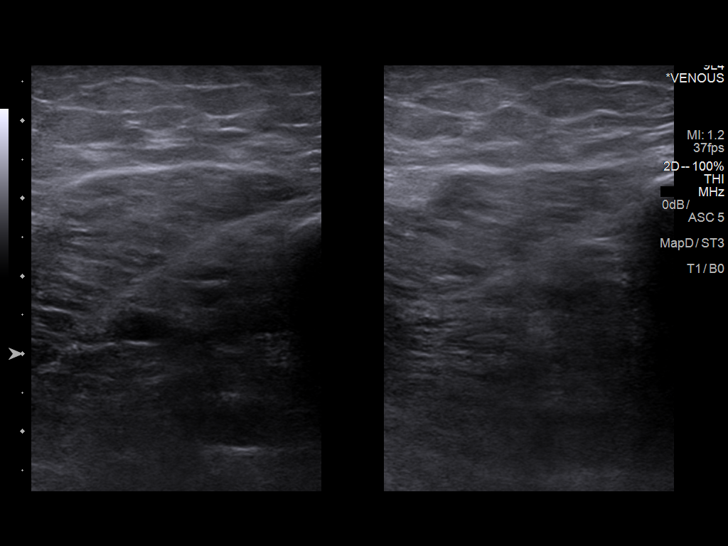
[im 49/49]
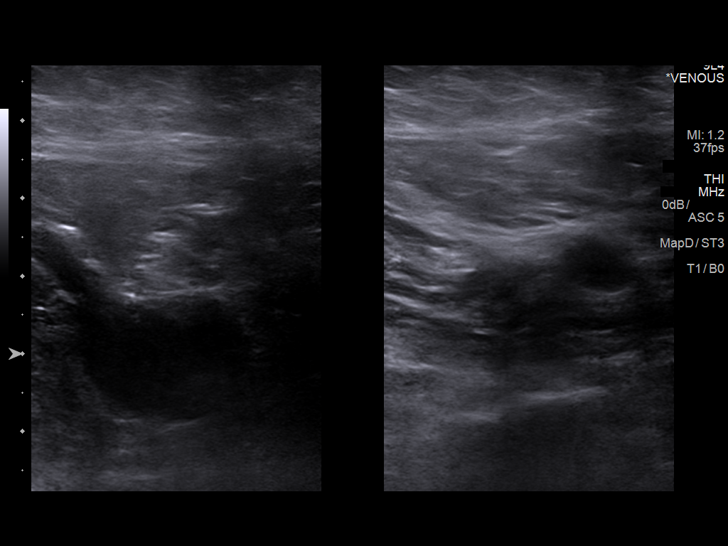

[13 of 24 positions shown; findings below may reference images not displayed]

FINDINGS: RIGHT LOWER EXTREMITY

Common Femoral Vein: No evidence of thrombus. Normal
compressibility, respiratory phasicity and response to augmentation.

Saphenofemoral Junction: No evidence of thrombus. Normal
compressibility and flow on color Doppler imaging.

Profunda Femoral Vein: No evidence of thrombus. Normal
compressibility and flow on color Doppler imaging.

Femoral Vein: No evidence of thrombus. Normal compressibility,
respiratory phasicity and response to augmentation.

Popliteal Vein: No evidence of thrombus. Normal compressibility,
respiratory phasicity and response to augmentation.

Calf Veins: No evidence of thrombus. Normal compressibility and flow
on color Doppler imaging.

Superficial Great Saphenous Vein: No evidence of thrombus. Normal
compressibility and flow on color Doppler imaging.

Venous Reflux:  None.

Other Findings:  None.

LEFT LOWER EXTREMITY

Common Femoral Vein: No evidence of thrombus. Normal
compressibility, respiratory phasicity and response to augmentation.

Saphenofemoral Junction: No evidence of thrombus. Normal
compressibility and flow on color Doppler imaging.

Profunda Femoral Vein: No evidence of thrombus. Normal
compressibility and flow on color Doppler imaging.

Femoral Vein: No evidence of thrombus. Normal compressibility,
respiratory phasicity and response to augmentation.

Popliteal Vein: No evidence of thrombus. Normal compressibility,
respiratory phasicity and response to augmentation.

Calf Veins: No evidence of thrombus. Normal compressibility and flow
on color Doppler imaging.

Superficial Great Saphenous Vein: No evidence of thrombus. Normal
compressibility and flow on color Doppler imaging.

Venous Reflux:  None.

Other Findings: Mild diffuse edema within the subcutaneous soft
tissues of the lower calf/ankle tissues.
IMPRESSION: 1. No evidence of deep venous thrombosis in either lower extremity.
2. Mild diffuse edema within the subcutaneous soft tissues of the
lower calfs/ankles bilaterally.

## 2017-11-14 ENCOUNTER — Ambulatory Visit: Payer: 59 | Admitting: Family Medicine

## 2017-11-19 ENCOUNTER — Encounter: Payer: Self-pay | Admitting: Family Medicine

## 2017-11-19 ENCOUNTER — Ambulatory Visit (INDEPENDENT_AMBULATORY_CARE_PROVIDER_SITE_OTHER): Payer: 59 | Admitting: Family Medicine

## 2017-11-19 VITALS — BP 110/68 | HR 81 | Temp 98.6°F | Resp 16 | Ht 62.0 in | Wt 203.2 lb

## 2017-11-19 DIAGNOSIS — N644 Mastodynia: Secondary | ICD-10-CM

## 2017-11-19 DIAGNOSIS — J3489 Other specified disorders of nose and nasal sinuses: Secondary | ICD-10-CM

## 2017-11-19 MED ORDER — MUPIROCIN 2 % EX OINT: 1.0000 "application " | TOPICAL_OINTMENT | Freq: Two times a day (BID) | CUTANEOUS | 0 refills | Status: DC

## 2017-11-19 NOTE — Progress Notes (Signed)
Patient ID: Beth Shepard, female   DOB: [birth year], 62 y.o.   MRN: [id number]     Subjective:  I acted as a Education administrator for Dr. Carollee Herter.  Guerry Bruin, [place]   Patient ID: Beth Shepard, female    DOB: 1955/11/28, 62 y.o.   MRN: [id number]  Chief Complaint  Patient presents with  . sore nose    HPI  Patient is in today for sores inside of nose.  Started Friday of last week.  She had shingles a few years ago in her nose and her daughter had mrsa--- so she is concerned.   She is also concerned about pain in her l breast--- she had an ultrasound in the past --over a year ago but she is worried because pain is worse.    Patient Care Team: Carollee Herter, Alferd Apa, DO as PCP - General [facility] Median Gayleen Orem, MD as Referring Physician (Cardiology)   Past Medical History:  Diagnosis Date  . Anxiety   . Bilateral edema of lower extremity    Swelling in legs and ankles  . Congestive heart failure ([place])   . COPD (chronic obstructive pulmonary disease) ([place])   . Diabetes mellitus    type 2  . Gastric polyps   . GERD (gastroesophageal reflux disease)    chronic  . Hyperlipidemia   . Hypertension   . IBS (irritable bowel syndrome)   . LBBB (left bundle branch block)    Dr. Gwenith Spitz, [facility]  . Pneumonia    hx of  . PONV (postoperative nausea and vomiting)    Had N/V after having tubal ligation and D&C  . Rheumatoid arthritis ([place])   . Shingles     Past Surgical History:  Procedure Laterality Date  . CARDIAC CATHETERIZATION     No PCI  . CHOLECYSTECTOMY    . COLONOSCOPY  06/06/2005   normal   . DILATION AND CURETTAGE OF UTERUS    . ESOPHAGOGASTRODUODENOSCOPY  06/06/2005, 11/17/2013  . JOINT REPLACEMENT Right 10/24/2015   rt hip replacement  . KNEE ARTHROSCOPY Left   . TOTAL HIP ARTHROPLASTY Right 10/24/2015   Procedure: RIGHT TOTAL HIP ARTHROPLASTY ANTERIOR APPROACH;  Surgeon: Leandrew Koyanagi, MD;  Location: [facility];  Service: Orthopedics;  Laterality: Right;  . TUBAL  LIGATION    . VAGINAL HYSTERECTOMY      Family History  Problem Relation Age of Onset  . Lymphoma Mother   . Heart disease Mother        chf, cabg x6  . Hypertension Mother   . GER disease Mother   . Hiatal hernia Mother   . Gallstones Mother   . Cancer Sister        brain stage 4  . Heart disease Maternal Grandmother 48       MI  . Heart disease Maternal Grandfather   . Stroke Paternal Grandmother   . Hypertension Paternal Grandmother   . Stroke Paternal Grandfather   . Hypertension Paternal Grandfather   . Rheum arthritis Father   . Hypertension Daughter   . Colon cancer Neg Hx   . Esophageal cancer Neg Hx   . Pancreatic cancer Neg Hx   . Liver disease Neg Hx   . Kidney disease Neg Hx     Social History   Socioeconomic History  . Marital status: Widowed    Spouse name: Not on file  . Number of children: 2  . Years of education: Not on file  . Highest education level: Not on  file  Occupational History  . Occupation: Administive     Employer: [employer]  Social Needs  . Financial resource strain: Not on file  . Food insecurity:    Worry: Not on file    Inability: Not on file  . Transportation needs:    Medical: Not on file    Non-medical: Not on file  Tobacco Use  . Smoking status: Former Smoker    Years: 33.00    Types: Cigarettes    Last attempt to quit: 06/25/2002    Years since quitting: 15.4  . Smokeless tobacco: Never Used  Substance and Sexual Activity  . Alcohol use: Yes    Comment: rare  . Drug use: No  . Sexual activity: Not Currently    Partners: Male  Lifestyle  . Physical activity:    Days per week: Not on file    Minutes per session: Not on file  . Stress: Not on file  Relationships  . Social connections:    Talks on phone: Not on file    Gets together: Not on file    Attends religious service: Not on file    Active member of club or organization: Not on file    Attends meetings of clubs or organizations: Not on file      Relationship status: Not on file  . Intimate partner violence:    Fear of current or ex partner: Not on file    Emotionally abused: Not on file    Physically abused: Not on file    Forced sexual activity: Not on file  Other Topics Concern  . Not on file  Social History Narrative   Limited exercise per cardiology    Outpatient Medications Prior to Visit  Medication Sig Dispense Refill  . Alogliptin Benzoate 25 MG TABS TAKE 1 TABLET BY MOUTH EVERY DAY 90 tablet 0  . ALPRAZolam (XANAX) 0.25 MG tablet TAKE 1 & 1/2 TABLETS BY MOUTH EACH MORNING 135 tablet 0  . aspirin 81 MG tablet Take 81 mg by mouth daily.    Marland Kitchen atorvastatin (LIPITOR) 20 MG tablet Take 1 tablet (20 mg total) by mouth daily. 90 tablet 1  . carvedilol (COREG) 12.5 MG tablet Take 1 tablet (12.5 mg total) by mouth 2 (two) times daily. 180 tablet 1  . fenofibrate 160 MG tablet Take 1 tablet (160 mg total) by mouth daily. 30 tablet 2  . furosemide (LASIX) 20 MG tablet Take 1 tablet (20 mg total) by mouth daily. 90 tablet 0  . glimepiride (AMARYL) 4 MG tablet Take 1 tablet (4 mg total) by mouth daily before breakfast. 90 tablet 0  . glucose blood (ONE TOUCH TEST STRIPS) test strip 1 each by Other route as needed. Reported on 11/02/2015    . Hyoscyamine Sulfate 0.375 MG TBCR Take 1 tablet (0.375 mg total) by mouth 2 (two) times daily. 60 tablet 1  . levocetirizine (XYZAL) 5 MG tablet TAKE 1 TABLET (5 MG TOTAL) BY MOUTH EVERY EVENING. 90 tablet 1  . lisinopril (PRINIVIL,ZESTRIL) 20 MG tablet TAKE 1 TABLET BY MOUTH EVERY DAY 90 tablet 0  . meloxicam (MOBIC) 7.5 MG tablet Take 1 tablet (7.5 mg total) by mouth daily as needed for pain. 30 tablet 1  . naproxen (NAPROSYN) 500 MG tablet Take 1 tablet (500 mg total) by mouth 2 (two) times daily with a meal. 30 tablet 0  . ondansetron (ZOFRAN) 4 MG tablet TAKE 1-2 TABLETS BY MOUTH EVERY 8 HOURS AS NEEDED FOR  NAUSEA 40 tablet 0  . pantoprazole (PROTONIX) 20 MG tablet TAKE 1 TABLET BY MOUTH  EVERY DAY 90 tablet 0  . spironolactone (ALDACTONE) 25 MG tablet Take 1 tablet (25 mg total) by mouth every other day. Dr Ola Spurr    . tizanidine (ZANAFLEX) 2 MG capsule Take 1 capsule (2 mg total) by mouth 2 (two) times daily as needed for muscle spasms. 30 capsule 1  . traMADol (ULTRAM) 50 MG tablet TAKE 1-2 TABLETS BY MOUTH EVERY 6 HOURS AS NEEDED 60 tablet 0  . amoxicillin (AMOXIL) 875 MG tablet Take 1 tablet (875 mg total) by mouth 2 (two) times daily. 20 tablet 0  . doxycycline (VIBRA-TABS) 100 MG tablet Take 1 tablet (100 mg total) by mouth 2 (two) times daily. 20 tablet 0  . gabapentin (NEURONTIN) 300 MG capsule Take 300 mg by mouth at bedtime.    Marland Kitchen HYDROcodone-homatropine (HYCODAN) 5-1.5 MG/5ML syrup Take 5 mLs by mouth every 8 (eight) hours as needed for cough. 120 mL 0  . predniSONE (STERAPRED UNI-PAK 21 TAB) 10 MG (21) TBPK tablet Take as directed 21 tablet 0  . promethazine-dextromethorphan (PROMETHAZINE-DM) 6.25-15 MG/5ML syrup Take 5 mLs by mouth 4 (four) times daily as needed. 118 mL 0  . sulfamethoxazole-trimethoprim (BACTRIM DS,SEPTRA DS) 800-160 MG tablet Take 1 tablet by mouth 2 (two) times daily. 20 tablet 0   No facility-administered medications prior to visit.     Allergies  Allergen Reactions  . Prednisone Palpitations and Other (See Comments)    Dose pack causes vomiting; Causes Gerd  . Codeine     REACTION: palpitation   . Ibuprofen Other (See Comments)    REACTION: questionable. Pt states that she can take this sometimes.    Review of Systems  Constitutional: Negative for fever and malaise/fatigue.  HENT: Negative for congestion.   Eyes: Negative for blurred vision.  Respiratory: Negative for cough and shortness of breath.   Cardiovascular: Negative for chest pain, palpitations and leg swelling.  Gastrointestinal: Negative for vomiting.  Musculoskeletal: Negative for back pain.  Skin: Negative for rash.       Sores in nose  Neurological: Negative for  loss of consciousness and headaches.       Objective:    Physical Exam  Constitutional: She is oriented to person, place, and time. She appears well-developed and well-nourished.  HENT:  Head: Normocephalic and atraumatic.  Nose: Mucosal edema present. No rhinorrhea, nose lacerations, sinus tenderness, nasal deformity, septal deviation or nasal septal hematoma.  No foreign bodies. Right sinus exhibits no maxillary sinus tenderness and no frontal sinus tenderness. Left sinus exhibits no maxillary sinus tenderness and no frontal sinus tenderness.  Eyes: Conjunctivae and EOM are normal.  Neck: Normal range of motion. Neck supple. No JVD present. Carotid bruit is not present. No thyromegaly present.  Cardiovascular: Normal rate, regular rhythm and normal heart sounds.  No murmur heard. Pulmonary/Chest: Effort normal and breath sounds normal. No respiratory distress. She has no wheezes. She has no rales. She exhibits no tenderness. Right breast exhibits no inverted nipple, no mass, no nipple discharge, no skin change and no tenderness. Left breast exhibits tenderness. No breast swelling, discharge or bleeding. Breasts are symmetrical.    Musculoskeletal: She exhibits no edema.  Neurological: She is alert and oriented to person, place, and time.  Psychiatric: She has a normal mood and affect.  Nursing note and vitals reviewed.   BP 110/68 (BP Location: Right Arm, Cuff Size: Large)   Pulse 81  Temp 98.6 F (37 C) (Oral)   Resp 16   Ht 5\' 2"  (1.575 m)   Wt 203 lb 3.2 oz (92.2 kg)   SpO2 97%   BMI 37.17 kg/m  Wt Readings from Last 3 Encounters:  11/19/17 203 lb 3.2 oz (92.2 kg)  08/19/17 204 lb (92.5 kg)  07/19/17 201 lb 3.2 oz (91.3 kg)   BP Readings from Last 3 Encounters:  11/19/17 110/68  08/19/17 110/64  07/19/17 128/76     Immunization History  Administered Date(s) Administered  . Influenza Whole 03/17/2009  . Influenza,inj,Quad PF,6+ Mos 09/07/2014, 04/09/2017  . PPD  Test 10/26/2015  . Pneumococcal Polysaccharide-23 11/13/2012  . Tdap 11/13/2012    Health Maintenance  Topic Date Due  . HIV Screening  07/18/1968  . MAMMOGRAM  07/19/1971  . COLONOSCOPY  06/07/2015  . OPHTHALMOLOGY EXAM  09/07/2015  . HEMOGLOBIN A1C  01/16/2018  . INFLUENZA VACCINE  01/23/2018  . TETANUS/TDAP  11/14/2022  . Hepatitis C Screening  Completed    Lab Results  Component Value Date   WBC 10.7 (H) 08/28/2016   HGB 11.2 (L) 08/28/2016   HCT 34.1 (L) 08/28/2016   PLT 371.0 08/28/2016   GLUCOSE 184 (H) 07/19/2017   CHOL 220 (H) 07/19/2017   TRIG 302.0 (H) 07/19/2017   HDL 37.70 (L) 07/19/2017   LDLDIRECT 124.0 07/19/2017   LDLCALC 115 (H) 04/06/2016   ALT 9 07/19/2017   AST 15 07/19/2017   NA 143 07/19/2017   K 4.9 07/19/2017   CL 105 07/19/2017   CREATININE 1.00 07/19/2017   BUN 16 07/19/2017   CO2 28 07/19/2017   TSH 0.890 10/21/2013   INR 0.99 10/14/2015   HGBA1C 9.0 (H) 07/19/2017   MICROALBUR 4.8 (H) 07/19/2017    Lab Results  Component Value Date   TSH 0.890 10/21/2013   Lab Results  Component Value Date   WBC 10.7 (H) 08/28/2016   HGB 11.2 (L) 08/28/2016   HCT 34.1 (L) 08/28/2016   MCV 83.8 08/28/2016   PLT 371.0 08/28/2016   Lab Results  Component Value Date   NA 143 07/19/2017   K 4.9 07/19/2017   CO2 28 07/19/2017   GLUCOSE 184 (H) 07/19/2017   BUN 16 07/19/2017   CREATININE 1.00 07/19/2017   BILITOT 0.4 07/19/2017   ALKPHOS 97 07/19/2017   AST 15 07/19/2017   ALT 9 07/19/2017   PROT 7.9 07/19/2017   ALBUMIN 4.2 07/19/2017   CALCIUM 9.8 07/19/2017   ANIONGAP 8 10/25/2015   GFR 59.33 (L) 07/19/2017   Lab Results  Component Value Date   CHOL 220 (H) 07/19/2017   Lab Results  Component Value Date   HDL 37.70 (L) 07/19/2017   Lab Results  Component Value Date   LDLCALC 115 (H) 04/06/2016   Lab Results  Component Value Date   TRIG 302.0 (H) 07/19/2017   Lab Results  Component Value Date   CHOLHDL 6 07/19/2017    Lab Results  Component Value Date   HGBA1C 9.0 (H) 07/19/2017         Assessment & Plan:   Problem List Items Addressed This Visit    None    Visit Diagnoses    Sore in nose    -  Primary   Relevant Medications   mupirocin ointment (BACTROBAN) 2 %   Other Relevant Orders   MRSA culture   Viral culture   Breast pain       Relevant Orders   MM  Digital Diagnostic Bilat   US BREAST COMPLETE UNI LEFT INC AXILLA      I have discontinued [name] Susman's gabapentin, doxycycline, promethazine-dextromethorphan, amoxicillin, sulfamethoxazole-trimethoprim, HYDROcodone-homatropine, and predniSONE. I am also having her start on mupirocin ointment. Additionally, I am having her maintain her glucose blood, aspirin, spironolactone, carvedilol, fenofibrate, atorvastatin, Hyoscyamine Sulfate, ondansetron, furosemide, Alogliptin Benzoate, traMADol, ALPRAZolam, levocetirizine, lisinopril, pantoprazole, glimepiride, naproxen, meloxicam, and tizanidine.  Meds ordered this encounter  Medications  . mupirocin ointment (BACTROBAN) 2 %    Sig: Place 1 application into the nose 2 (two) times daily.    Dispense:  22 g    Refill:  0    CMA served as scribe during this visit. History, Physical and Plan performed by medical provider. Documentation and orders reviewed and attested to.  Ann Held, DO

## 2017-11-19 NOTE — Patient Instructions (Signed)
Mupirocin nasal ointment What is this medicine? MUPIROCIN CALCIUM (myoo PEER oh sin KAL see um) is an antibiotic. It is used inside the nose to treat infections that are caused by certain bacteria. This helps prevent the spread of infection to patients and health care workers during outbreaks at institutions. This medicine may be used for other purposes; ask your health care provider or pharmacist if you have questions. COMMON BRAND NAME(S): Bactroban What should I tell my health care provider before I take this medicine? They need to know if you have any of these conditions: -an unusual or allergic reaction to mupirocin, other medicines, foods, dyes, or preservatives -pregnant or trying to get pregnant -breast-feeding How should I use this medicine? This medicine is only for use inside the nose. Follow the directions on the prescription label. Wash your hands before and after use. Squeeze half the contents of a single-use tube into one nostril, then squeeze the other half into the other nostril. Press the sides of your nose together and gently massage after application to spread the ointment throughout the nostrils. Do not use your medicine more often than directed. Finish the full course of medicine prescribed by your doctor or health care professional even if you think your condition is better. Talk to your pediatrician regarding the use of this medicine in children. Special care may be needed. Overdosage: If you think you have taken too much of this medicine contact a poison control center or emergency room at once. NOTE: This medicine is only for you. Do not share this medicine with others. What if I miss a dose? If you miss a dose, take it as soon as you can. If it is almost time for your next dose, take only that dose. Do not take double or extra doses. What may interact with this medicine? Interactions are not expected. Do not use any other nose products without telling your doctor or  health care professional. This list may not describe all possible interactions. Give your health care provider a list of all the medicines, herbs, non-prescription drugs, or dietary supplements you use. Also tell them if you smoke, drink alcohol, or use illegal drugs. Some items may interact with your medicine. What should I watch for while using this medicine? If your nose is severely irritated, burning or stinging from use of this medicine, stop using it and contact your doctor or health care professional. Do not get this medicine in your eyes. If you do, rinse out with plenty of cool tap water. What side effects may I notice from receiving this medicine? Side effects that you should report to your doctor or health care professional as soon as possible: -severe irritation, burning, stinging, or pain Side effects that usually do not require medical attention (report to your doctor or health care professional if they continue or are bothersome): -altered taste -cough -headache -skin itching -sore throat -stuffy or runny nose This list may not describe all possible side effects. Call your doctor for medical advice about side effects. You may report side effects to FDA at 1-800-FDA-1088. Where should I keep my medicine? Keep out of the reach of children. Store at room temperature between 15 and 30 degrees C (59 and 86 degrees F). Do not refrigerate. One tube of ointment is for single use in both nostrils. Throw away after use. NOTE: This sheet is a summary. It may not cover all possible information. If you have questions about this medicine, talk to your doctor, pharmacist, or  health care provider.  2018 Elsevier/Gold Standard (2007-12-29 14:36:10)  

## 2017-11-21 ENCOUNTER — Ambulatory Visit: Payer: 59 | Admitting: Family Medicine

## 2017-11-21 LAB — MRSA CULTURE
MICRO NUMBER:: 90640380
SPECIMEN QUALITY:: ADEQUATE

## 2017-11-25 ENCOUNTER — Other Ambulatory Visit: Payer: Self-pay | Admitting: Family Medicine

## 2017-12-05 ENCOUNTER — Telehealth: Payer: Self-pay | Admitting: Family Medicine

## 2017-12-05 NOTE — Telephone Encounter (Unsigned)
Copied from [place] [phone]. Topic: Quick Communication - See Telephone Encounter >> Dec 05, 2017 10:10 AM [name] wrote: Pt wanting to know why her mammogram check was for only 1 breast.  Pt was thinking both could be done at one time instead of coming back to have the other one done.  Please call pt back - call pt back on this one if the cell doesn't work - [phone].

## 2017-12-05 NOTE — Telephone Encounter (Signed)
Patient notified that she will be having [place] left breast and mammogram done next week together.

## 2017-12-11 ENCOUNTER — Other Ambulatory Visit: Payer: 59

## 2017-12-13 ENCOUNTER — Ambulatory Visit
Admission: RE | Admit: 2017-12-13 | Discharge: 2017-12-13 | Disposition: A | Discharge status: Home / Self Care | Payer: 59 | Source: Ambulatory Visit | Attending: Family Medicine | Admitting: Family Medicine

## 2017-12-13 DIAGNOSIS — N644 Mastodynia: Secondary | ICD-10-CM

## 2017-12-13 DIAGNOSIS — R928 Other abnormal and inconclusive findings on diagnostic imaging of breast: Secondary | ICD-10-CM | POA: Diagnosis not present

## 2017-12-14 LAB — VIRAL CULTURE VIRC
MICRO NUMBER:: 90640379
SPECIMEN QUALITY:: ADEQUATE

## 2017-12-17 ENCOUNTER — Encounter: Payer: Self-pay | Admitting: Family Medicine

## 2017-12-17 ENCOUNTER — Ambulatory Visit (INDEPENDENT_AMBULATORY_CARE_PROVIDER_SITE_OTHER): Payer: 59 | Admitting: Family Medicine

## 2017-12-17 VITALS — BP 129/70 | HR 86 | Temp 98.4°F | Resp 16 | Ht 61.81 in | Wt 204.2 lb

## 2017-12-17 DIAGNOSIS — R110 Nausea: Secondary | ICD-10-CM

## 2017-12-17 DIAGNOSIS — J449 Chronic obstructive pulmonary disease, unspecified: Secondary | ICD-10-CM

## 2017-12-17 DIAGNOSIS — N183 Chronic kidney disease, stage 3 (moderate): Secondary | ICD-10-CM | POA: Diagnosis not present

## 2017-12-17 DIAGNOSIS — I10 Essential (primary) hypertension: Secondary | ICD-10-CM | POA: Diagnosis not present

## 2017-12-17 DIAGNOSIS — E1165 Type 2 diabetes mellitus with hyperglycemia: Secondary | ICD-10-CM

## 2017-12-17 DIAGNOSIS — E785 Hyperlipidemia, unspecified: Secondary | ICD-10-CM

## 2017-12-17 DIAGNOSIS — Z0000 Encounter for general adult medical examination without abnormal findings: Secondary | ICD-10-CM

## 2017-12-17 DIAGNOSIS — IMO0002 Reserved for concepts with insufficient information to code with codable children: Secondary | ICD-10-CM

## 2017-12-17 DIAGNOSIS — N1831 Chronic kidney disease, stage 3a: Secondary | ICD-10-CM

## 2017-12-17 DIAGNOSIS — E1151 Type 2 diabetes mellitus with diabetic peripheral angiopathy without gangrene: Secondary | ICD-10-CM | POA: Diagnosis not present

## 2017-12-17 DIAGNOSIS — N644 Mastodynia: Secondary | ICD-10-CM

## 2017-12-17 MED ORDER — GABAPENTIN 100 MG PO CAPS: 100.0000 mg | ORAL_CAPSULE | Freq: Three times a day (TID) | ORAL | 3 refills | Status: DC

## 2017-12-17 MED ORDER — TIZANIDINE HCL 2 MG PO CAPS: 2.0000 mg | ORAL_CAPSULE | Freq: Two times a day (BID) | ORAL | 1 refills | Status: DC | PRN

## 2017-12-17 MED ORDER — PANTOPRAZOLE SODIUM 20 MG PO TBEC: 20.0000 mg | DELAYED_RELEASE_TABLET | Freq: Every day | ORAL | 0 refills | Status: DC

## 2017-12-17 MED ORDER — ONDANSETRON HCL 4 MG PO TABS: ORAL_TABLET | ORAL | 0 refills | Status: DC

## 2017-12-17 MED ORDER — ALPRAZOLAM 0.25 MG PO TABS: ORAL_TABLET | ORAL | 0 refills | Status: DC

## 2017-12-17 MED ORDER — MELOXICAM 7.5 MG PO TABS: 7.5000 mg | ORAL_TABLET | Freq: Every day | ORAL | 1 refills | Status: DC | PRN

## 2017-12-17 NOTE — Patient Instructions (Signed)
Preventive Care 40-64 Years, Female Preventive care refers to lifestyle choices and visits with your health care provider that can promote health and wellness. What does preventive care include?  A yearly physical exam. This is also called an annual well check.  Dental exams once or twice a year.  Routine eye exams. Ask your health care provider how often you should have your eyes checked.  Personal lifestyle choices, including: ? Daily care of your teeth and gums. ? Regular physical activity. ? Eating a healthy diet. ? Avoiding tobacco and drug use. ? Limiting alcohol use. ? Practicing safe sex. ? Taking low-dose aspirin daily starting at age 58. ? Taking vitamin and mineral supplements as recommended by your health care provider. What happens during an annual well check? The services and screenings done by your health care provider during your annual well check will depend on your age, overall health, lifestyle risk factors, and family history of disease. Counseling Your health care provider may ask you questions about your:  Alcohol use.  Tobacco use.  Drug use.  Emotional well-being.  Home and relationship well-being.  Sexual activity.  Eating habits.  Work and work Statistician.  Method of birth control.  Menstrual cycle.  Pregnancy history.  Screening You may have the following tests or measurements:  Height, weight, and BMI.  Blood pressure.  Lipid and cholesterol levels. These may be checked every 5 years, or more frequently if you are over 81 years old.  Skin check.  Lung cancer screening. You may have this screening every year starting at age 78 if you have a 30-pack-year history of smoking and currently smoke or have quit within the past 15 years.  Fecal occult blood test (FOBT) of the stool. You may have this test every year starting at age 65.  Flexible sigmoidoscopy or colonoscopy. You may have a sigmoidoscopy every 5 years or a colonoscopy  every 10 years starting at age 30.  Hepatitis C blood test.  Hepatitis B blood test.  Sexually transmitted disease (STD) testing.  Diabetes screening. This is done by checking your blood sugar (glucose) after you have not eaten for a while (fasting). You may have this done every 1-3 years.  Mammogram. This may be done every 1-2 years. Talk to your health care provider about when you should start having regular mammograms. This may depend on whether you have a family history of breast cancer.  BRCA-related cancer screening. This may be done if you have a family history of breast, ovarian, tubal, or peritoneal cancers.  Pelvic exam and Pap test. This may be done every 3 years starting at age 80. Starting at age 36, this may be done every 5 years if you have a Pap test in combination with an HPV test.  Bone density scan. This is done to screen for osteoporosis. You may have this scan if you are at high risk for osteoporosis.  Discuss your test results, treatment options, and if necessary, the need for more tests with your health care provider. Vaccines Your health care provider may recommend certain vaccines, such as:  Influenza vaccine. This is recommended every year.  Tetanus, diphtheria, and acellular pertussis (Tdap, Td) vaccine. You may need a Td booster every 10 years.  Varicella vaccine. You may need this if you have not been vaccinated.  Zoster vaccine. You may need this after age 5.  Measles, mumps, and rubella (MMR) vaccine. You may need at least one dose of MMR if you were born in  1959 or later. You may also need a second dose.  Pneumococcal 13-valent conjugate (PCV13) vaccine. You may need this if you have certain conditions and were not previously vaccinated.  Pneumococcal polysaccharide (PPSV23) vaccine. You may need one or two doses if you smoke cigarettes or if you have certain conditions.  Meningococcal vaccine. You may need this if you have certain  conditions.  Hepatitis A vaccine. You may need this if you have certain conditions or if you travel or work in places where you may be exposed to hepatitis A.  Hepatitis B vaccine. You may need this if you have certain conditions or if you travel or work in places where you may be exposed to hepatitis B.  Haemophilus influenzae type b (Hib) vaccine. You may need this if you have certain conditions.  Talk to your health care provider about which screenings and vaccines you need and how often you need them. This information is not intended to replace advice given to you by your health care provider. Make sure you discuss any questions you have with your health care provider. Document Released: 07/08/2015 Document Revised: 02/29/2016 Document Reviewed: 04/12/2015 Elsevier Interactive Patient Education  2018 [employer]  

## 2017-12-17 NOTE — Progress Notes (Signed)
Subjective:  I acted as a Education administrator for [employer]. Beth Shepard, Beth Shepard   Patient ID: Beth Shepard, female    DOB: 1956-02-28, 62 y.o.   MRN: [id number]  Chief Complaint  Patient presents with  . Annual Exam    HPI  Patient is in today for annual exam.  Patient Care Team: Carollee Herter, Alferd Apa, DO as PCP - General Cheek, Gayleen Orem, MD as Referring Physician (Cardiology)   Past Medical History:  Diagnosis Date  . Anxiety   . Bilateral edema of lower extremity    Swelling in legs and ankles  . Congestive heart failure ([place])   . COPD (chronic obstructive pulmonary disease) ([place])   . Diabetes mellitus    type 2  . Gastric polyps   . GERD (gastroesophageal reflux disease)    chronic  . Hyperlipidemia   . Hypertension   . IBS (irritable bowel syndrome)   . LBBB (left bundle branch block)    Dr. Gwenith Spitz, [facility]  . Pneumonia    hx of  . PONV (postoperative nausea and vomiting)    Had N/V after having tubal ligation and D&C  . Rheumatoid arthritis ([place])   . Shingles     Past Surgical History:  Procedure Laterality Date  . CARDIAC CATHETERIZATION     No PCI  . CHOLECYSTECTOMY    . COLONOSCOPY  06/06/2005   normal   . DILATION AND CURETTAGE OF UTERUS    . ESOPHAGOGASTRODUODENOSCOPY  06/06/2005, 11/17/2013  . JOINT REPLACEMENT Right 10/24/2015   rt hip replacement  . KNEE ARTHROSCOPY Left   . TOTAL HIP ARTHROPLASTY Right 10/24/2015   Procedure: RIGHT TOTAL HIP ARTHROPLASTY ANTERIOR APPROACH;  Surgeon: Leandrew Koyanagi, MD;  Location: [facility];  Service: Orthopedics;  Laterality: Right;  . TUBAL LIGATION    . VAGINAL HYSTERECTOMY      Family History  Problem Relation Age of Onset  . Lymphoma Mother   . Heart disease Mother        chf, cabg x6  . Hypertension Mother   . GER disease Mother   . Hiatal hernia Mother   . Gallstones Mother   . Cancer Sister        brain stage 4  . Heart disease Maternal Grandmother 72       MI  . Heart disease  Maternal Grandfather   . Stroke Paternal Grandmother   . Hypertension Paternal Grandmother   . Stroke Paternal Grandfather   . Hypertension Paternal Grandfather   . Rheum arthritis Father   . Hypertension Daughter   . Colon cancer Neg Hx   . Esophageal cancer Neg Hx   . Pancreatic cancer Neg Hx   . Liver disease Neg Hx   . Kidney disease Neg Hx     Social History   Socioeconomic History  . Marital status: Widowed    Spouse name: Not on file  . Number of children: 2  . Years of education: Not on file  . Highest education level: Not on file  Occupational History  . Occupation: Administive     Employer: [employer]  Social Needs  . Financial resource strain: Not on file  . Food insecurity:    Worry: Not on file    Inability: Not on file  . Transportation needs:    Medical: Not on file    Non-medical: Not on file  Tobacco Use  . Smoking status: Former Smoker    Years: 33.00    Types:  Cigarettes    Last attempt to quit: 06/25/2002    Years since quitting: 15.4  . Smokeless tobacco: Never Used  Substance and Sexual Activity  . Alcohol use: Yes    Comment: rare  . Drug use: No  . Sexual activity: Not Currently    Partners: Male  Lifestyle  . Physical activity:    Days per week: Not on file    Minutes per session: Not on file  . Stress: Not on file  Relationships  . Social connections:    Talks on phone: Not on file    Gets together: Not on file    Attends religious service: Not on file    Active member of club or organization: Not on file    Attends meetings of clubs or organizations: Not on file    Relationship status: Not on file  . Intimate partner violence:    Fear of current or ex partner: Not on file    Emotionally abused: Not on file    Physically abused: Not on file    Forced sexual activity: Not on file  Other Topics Concern  . Not on file  Social History Narrative   Limited exercise per cardiology    Outpatient Medications Prior to  Visit  Medication Sig Dispense Refill  . Alogliptin Benzoate 25 MG TABS TAKE 1 TABLET BY MOUTH EVERY DAY 90 tablet 0  . aspirin 81 MG tablet Take 81 mg by mouth daily.    Marland Kitchen atorvastatin (LIPITOR) 20 MG tablet Take 1 tablet (20 mg total) by mouth daily. 90 tablet 1  . carvedilol (COREG) 12.5 MG tablet Take 1 tablet (12.5 mg total) by mouth 2 (two) times daily. 180 tablet 1  . fenofibrate 160 MG tablet Take 1 tablet (160 mg total) by mouth daily. 30 tablet 2  . furosemide (LASIX) 20 MG tablet Take 1 tablet (20 mg total) by mouth daily. 90 tablet 0  . glimepiride (AMARYL) 4 MG tablet Take 1 tablet (4 mg total) by mouth daily before breakfast. 90 tablet 0  . glucose blood (ONE TOUCH TEST STRIPS) test strip 1 each by Other route as needed. Reported on 11/02/2015    . Hyoscyamine Sulfate 0.375 MG TBCR Take 1 tablet (0.375 mg total) by mouth 2 (two) times daily. 60 tablet 1  . levocetirizine (XYZAL) 5 MG tablet TAKE 1 TABLET (5 MG TOTAL) BY MOUTH EVERY EVENING. 90 tablet 1  . lisinopril (PRINIVIL,ZESTRIL) 20 MG tablet TAKE 1 TABLET BY MOUTH EVERY DAY 90 tablet 0  . mupirocin ointment (BACTROBAN) 2 % Place 1 application into the nose 2 (two) times daily. 22 g 0  . spironolactone (ALDACTONE) 25 MG tablet Take 1 tablet (25 mg total) by mouth every other day. Dr Ola Spurr    . ALPRAZolam (XANAX) 0.25 MG tablet TAKE 1 & 1/2 TABLETS BY MOUTH EACH MORNING 135 tablet 0  . meloxicam (MOBIC) 7.5 MG tablet Take 1 tablet (7.5 mg total) by mouth daily as needed for pain. 30 tablet 1  . naproxen (NAPROSYN) 500 MG tablet Take 1 tablet (500 mg total) by mouth 2 (two) times daily with a meal. 30 tablet 0  . ondansetron (ZOFRAN) 4 MG tablet TAKE 1-2 TABLETS BY MOUTH EVERY 8 HOURS AS NEEDED FOR NAUSEA 40 tablet 0  . pantoprazole (PROTONIX) 20 MG tablet TAKE 1 TABLET BY MOUTH EVERY DAY 90 tablet 0  . tizanidine (ZANAFLEX) 2 MG capsule Take 1 capsule (2 mg total) by mouth 2 (two) times daily  as needed for muscle spasms. 30  capsule 1  . traMADol (ULTRAM) 50 MG tablet TAKE 1-2 TABLETS BY MOUTH EVERY 6 HOURS AS NEEDED 60 tablet 0   No facility-administered medications prior to visit.     Allergies  Allergen Reactions  . Prednisone Palpitations and Other (See Comments)    Dose pack causes vomiting; Causes Gerd  . Codeine     REACTION: palpitation   . Ibuprofen Other (See Comments)    REACTION: questionable. Pt states that she can take this sometimes.    Review of Systems  Constitutional: Negative for chills, fever and malaise/fatigue.  HENT: Negative for congestion and hearing loss.   Eyes: Negative for blurred vision and discharge.  Respiratory: Negative for cough, sputum production and shortness of breath.   Cardiovascular: Negative for chest pain, palpitations and leg swelling.  Gastrointestinal: Negative for abdominal pain, blood in stool, constipation, diarrhea, heartburn, nausea and vomiting.  Genitourinary: Negative for dysuria, frequency, hematuria and urgency.  Musculoskeletal: Negative for back pain, falls and myalgias.  Skin: Negative for rash.  Neurological: Negative for dizziness, sensory change, loss of consciousness, weakness and headaches.  Endo/Heme/Allergies: Negative for environmental allergies. Does not bruise/bleed easily.  Psychiatric/Behavioral: Negative for depression and suicidal ideas. The patient is not nervous/anxious and does not have insomnia.        Objective:    Physical Exam  Constitutional: She is oriented to person, place, and time. She appears well-developed and well-nourished. No distress.  HENT:  Head: Normocephalic and atraumatic.  Right Ear: External ear normal.  Left Ear: External ear normal.  Nose: Nose normal.  Mouth/Throat: Oropharynx is clear and moist.  Eyes: Pupils are equal, round, and reactive to light. Conjunctivae and EOM are normal. Right eye exhibits no discharge. Left eye exhibits no discharge.  Neck: Normal range of motion. Neck supple. No  JVD present. No thyromegaly present.  Cardiovascular: Normal rate, regular rhythm, normal heart sounds and intact distal pulses.  No murmur heard. Pulmonary/Chest: Effort normal and breath sounds normal. No respiratory distress. She has no wheezes. She has no rales. She exhibits no tenderness. Right breast exhibits no inverted nipple, no mass, no nipple discharge, no skin change and no tenderness. Left breast exhibits mass and tenderness. Left breast exhibits no inverted nipple, no nipple discharge and no skin change.    Abdominal: Soft. Bowel sounds are normal. She exhibits no distension and no mass. There is no tenderness. There is no rebound and no guarding.  Genitourinary: Vagina normal and uterus normal. Rectal exam shows guaiac negative stool. No vaginal discharge found.  Musculoskeletal: Normal range of motion. She exhibits no edema or tenderness.  Lymphadenopathy:    She has no cervical adenopathy.  Neurological: She is alert and oriented to person, place, and time. She has normal reflexes. No cranial nerve deficit.  Skin: Skin is warm and dry. No rash noted. She is not diaphoretic. No erythema.  Psychiatric: She has a normal mood and affect. Her behavior is normal. Judgment and thought content normal.  Nursing note and vitals reviewed.   BP 129/70 (BP Location: Left Arm, Patient Position: Sitting, Cuff Size: Normal)   Pulse 86   Temp 98.4 F (36.9 C) (Oral)   Resp 16   Ht 5' 1.81" (1.57 m)   Wt 204 lb 3.2 oz (92.6 kg)   SpO2 98%   BMI 37.58 kg/m  Wt Readings from Last 3 Encounters:  12/17/17 204 lb 3.2 oz (92.6 kg)  11/19/17 203 lb 3.2 oz (  92.2 kg)  08/19/17 204 lb (92.5 kg)   BP Readings from Last 3 Encounters:  12/17/17 129/70  11/19/17 110/68  08/19/17 110/64     Immunization History  Administered Date(s) Administered  . Influenza Whole 03/17/2009  . Influenza,inj,Quad PF,6+ Mos 09/07/2014, 04/09/2017  . PPD Test 10/26/2015  . Pneumococcal Polysaccharide-23  11/13/2012  . Tdap 11/13/2012    Health Maintenance  Topic Date Due  . HIV Screening  07/18/1968  . COLONOSCOPY  06/07/2015  . OPHTHALMOLOGY EXAM  09/07/2015  . HEMOGLOBIN A1C  01/16/2018  . INFLUENZA VACCINE  01/23/2018  . MAMMOGRAM  12/14/2018  . TETANUS/TDAP  11/14/2022  . Hepatitis C Screening  Completed    Lab Results  Component Value Date   WBC 10.7 (H) 08/28/2016   HGB 11.2 (L) 08/28/2016   HCT 34.1 (L) 08/28/2016   PLT 371.0 08/28/2016   GLUCOSE 184 (H) 07/19/2017   CHOL 220 (H) 07/19/2017   TRIG 302.0 (H) 07/19/2017   HDL 37.70 (L) 07/19/2017   LDLDIRECT 124.0 07/19/2017   LDLCALC 115 (H) 04/06/2016   ALT 9 07/19/2017   AST 15 07/19/2017   NA 143 07/19/2017   K 4.9 07/19/2017   CL 105 07/19/2017   CREATININE 1.00 07/19/2017   BUN 16 07/19/2017   CO2 28 07/19/2017   TSH 0.890 10/21/2013   INR 0.99 10/14/2015   HGBA1C 9.0 (H) 07/19/2017   MICROALBUR 4.8 (H) 07/19/2017    Lab Results  Component Value Date   TSH 0.890 10/21/2013   Lab Results  Component Value Date   WBC 10.7 (H) 08/28/2016   HGB 11.2 (L) 08/28/2016   HCT 34.1 (L) 08/28/2016   MCV 83.8 08/28/2016   PLT 371.0 08/28/2016   Lab Results  Component Value Date   NA 143 07/19/2017   K 4.9 07/19/2017   CO2 28 07/19/2017   GLUCOSE 184 (H) 07/19/2017   BUN 16 07/19/2017   CREATININE 1.00 07/19/2017   BILITOT 0.4 07/19/2017   ALKPHOS 97 07/19/2017   AST 15 07/19/2017   ALT 9 07/19/2017   PROT 7.9 07/19/2017   ALBUMIN 4.2 07/19/2017   CALCIUM 9.8 07/19/2017   ANIONGAP 8 10/25/2015   GFR 59.33 (L) 07/19/2017   Lab Results  Component Value Date   CHOL 220 (H) 07/19/2017   Lab Results  Component Value Date   HDL 37.70 (L) 07/19/2017   Lab Results  Component Value Date   LDLCALC 115 (H) 04/06/2016   Lab Results  Component Value Date   TRIG 302.0 (H) 07/19/2017   Lab Results  Component Value Date   CHOLHDL 6 07/19/2017   Lab Results  Component Value Date   HGBA1C 9.0 (H)  07/19/2017         Assessment & Plan:   Problem List Items Addressed This Visit      Unprioritized   Chronic kidney disease (CKD) stage G3a/A1, moderately decreased glomerular filtration rate (GFR) between 45-59 mL/min/1.73 square meter and albuminuria creatinine ratio less than 30 mg/g (HCC)    Check labs      COPD (chronic obstructive pulmonary disease) ([place])    Per pulm con't meds      DM (diabetes mellitus) type II uncontrolled, periph vascular disorder (HCC)    hgba1c acceptable, minimize simple carbs. Increase exercise as tolerated. Continue current meds      Relevant Medications   ALPRAZolam (XANAX) 0.25 MG tablet   pantoprazole (PROTONIX) 20 MG tablet   Other Relevant Orders   Hemoglobin  A1c   Comprehensive metabolic panel   Essential hypertension    Well controlled, no changes to meds. Encouraged heart healthy diet such as the DASH diet and exercise as tolerated.       Relevant Medications   ALPRAZolam (XANAX) 0.25 MG tablet   pantoprazole (PROTONIX) 20 MG tablet   Other Relevant Orders   CBC with Differential/Platelet   Comprehensive metabolic panel   Hyperlipidemia LDL goal <70    Encouraged heart healthy diet, increase exercise, avoid trans fats, consider a krill oil cap daily      Relevant Medications   ALPRAZolam (XANAX) 0.25 MG tablet   pantoprazole (PROTONIX) 20 MG tablet   Preventative health care - Primary    ghm utd Check labs See AVS       Other Visit Diagnoses    Hyperlipidemia, unspecified hyperlipidemia type       Relevant Medications   ALPRAZolam (XANAX) 0.25 MG tablet   Other Relevant Orders   Lipid panel   Comprehensive metabolic panel   Hyperlipidemia       Relevant Medications   pantoprazole (PROTONIX) 20 MG tablet   Other Relevant Orders   Lipid panel   Comprehensive metabolic panel   Hyperlipemia       Relevant Medications   pantoprazole (PROTONIX) 20 MG tablet   Other Relevant Orders   Lipid panel   Comprehensive  metabolic panel   Nausea without vomiting       Relevant Medications   ondansetron (ZOFRAN) 4 MG tablet   Breast pain       Relevant Medications   gabapentin (NEURONTIN) 100 MG capsule   Other Relevant Orders   MR BREAST BILATERAL W CONTRAST   Type 2 diabetes mellitus with hyperglycemia, without long-term current use of insulin (HCC)       Relevant Orders   Ambulatory referral to Podiatry      I have discontinued [name] Nguyen's traMADol and naproxen. I have also changed her ALPRAZolam and pantoprazole. Additionally, I am having her start on gabapentin. Lastly, I am having her maintain her glucose blood, aspirin, spironolactone, carvedilol, fenofibrate, atorvastatin, Hyoscyamine Sulfate, furosemide, Alogliptin Benzoate, levocetirizine, glimepiride, mupirocin ointment, lisinopril, ondansetron, tizanidine, and meloxicam.  Meds ordered this encounter  Medications  . ALPRAZolam (XANAX) 0.25 MG tablet    Sig: 1 po tid prn    Dispense:  135 tablet    Refill:  0    Not to exceed 5 additional fills before 10/02/2017.  . pantoprazole (PROTONIX) 20 MG tablet    Sig: Take 1 tablet (20 mg total) by mouth daily.    Dispense:  90 tablet    Refill:  0  . ondansetron (ZOFRAN) 4 MG tablet    Sig: TAKE 1-2 TABLETS BY MOUTH EVERY 8 HOURS AS NEEDED FOR NAUSEA    Dispense:  40 tablet    Refill:  0  . tizanidine (ZANAFLEX) 2 MG capsule    Sig: Take 1 capsule (2 mg total) by mouth 2 (two) times daily as needed for muscle spasms.    Dispense:  60 capsule    Refill:  1  . meloxicam (MOBIC) 7.5 MG tablet    Sig: Take 1 tablet (7.5 mg total) by mouth daily as needed for pain.    Dispense:  90 tablet    Refill:  1  . gabapentin (NEURONTIN) 100 MG capsule    Sig: Take 1 capsule (100 mg total) by mouth 3 (three) times daily.    Dispense:  90 capsule  Refill:  3    CMA served as Education administrator during this visit. History, Physical and Plan performed by medical provider. Documentation and orders reviewed and  attested to.  Ann Held, DO

## 2017-12-18 ENCOUNTER — Other Ambulatory Visit: Payer: Self-pay | Admitting: *Deleted

## 2017-12-18 DIAGNOSIS — Z0000 Encounter for general adult medical examination without abnormal findings: Secondary | ICD-10-CM | POA: Insufficient documentation

## 2017-12-18 DIAGNOSIS — N644 Mastodynia: Secondary | ICD-10-CM

## 2017-12-18 LAB — COMPREHENSIVE METABOLIC PANEL
ALBUMIN: 4.3 g/dL (ref 3.5–5.2)
ALK PHOS: 92 U/L (ref 39–117)
ALT: 11 U/L (ref 0–35)
AST: 15 U/L (ref 0–37)
BILIRUBIN TOTAL: 0.3 mg/dL (ref 0.2–1.2)
BUN: 16 mg/dL (ref 6–23)
CO2: 24 mEq/L (ref 19–32)
CREATININE: 1.02 mg/dL (ref 0.40–1.20)
Calcium: 9.9 mg/dL (ref 8.4–10.5)
Chloride: 102 mEq/L (ref 96–112)
GFR: 57.91 mL/min — AB (ref >60.00)
Glucose, Bld: 126 mg/dL — ABNORMAL HIGH (ref 70–99)
Potassium: 4.3 mEq/L (ref 3.5–5.1)
Sodium: 138 mEq/L (ref 135–145)
TOTAL PROTEIN: 7.3 g/dL (ref 6.0–8.3)

## 2017-12-18 LAB — LIPID PANEL
Cholesterol: 241 mg/dL — ABNORMAL HIGH (ref 0–200)
HDL: 42.4 mg/dL (ref >39.00)
NONHDL: 198.33
TRIGLYCERIDES: 310 mg/dL — AB (ref 0.0–149.0)
Total CHOL/HDL Ratio: 6
VLDL: 62 mg/dL — ABNORMAL HIGH (ref 0.0–40.0)

## 2017-12-18 LAB — CBC WITH DIFFERENTIAL/PLATELET
Basophils Absolute: 0.1 10*3/uL (ref 0.0–0.1)
Basophils Relative: 1.3 % (ref 0.0–3.0)
Eosinophils Absolute: 0.2 10*3/uL (ref 0.0–0.7)
Eosinophils Relative: 2.2 % (ref 0.0–5.0)
HCT: 34.7 % — ABNORMAL LOW (ref 36.0–46.0)
Hemoglobin: 11.5 g/dL — ABNORMAL LOW (ref 12.0–15.0)
Lymphocytes Relative: 24.9 % (ref 12.0–46.0)
Lymphs Abs: 2.5 10*3/uL (ref 0.7–4.0)
MCHC: 33 g/dL (ref 30.0–36.0)
MCV: 84.6 fl (ref 78.0–100.0)
Monocytes Absolute: 0.7 10*3/uL (ref 0.1–1.0)
Monocytes Relative: 6.7 % (ref 3.0–12.0)
Neutro Abs: 6.6 10*3/uL (ref 1.4–7.7)
Neutrophils Relative %: 64.9 % (ref 43.0–77.0)
Platelets: 343 10*3/uL (ref 150.0–400.0)
RBC: 4.11 Mil/uL (ref 3.87–5.11)
RDW: 15.1 % (ref 11.5–15.5)
WBC: 10.1 10*3/uL (ref 4.0–10.5)

## 2017-12-18 LAB — HEMOGLOBIN A1C: Hgb A1c MFr Bld: 7.9 % — ABNORMAL HIGH (ref 4.6–6.5)

## 2017-12-18 LAB — LDL CHOLESTEROL, DIRECT: Direct LDL: 139 mg/dL

## 2017-12-18 NOTE — Assessment & Plan Note (Signed)
Encouraged heart healthy diet, increase exercise, avoid trans fats, consider a krill oil cap daily 

## 2017-12-18 NOTE — Assessment & Plan Note (Signed)
Well controlled, no changes to meds. Encouraged heart healthy diet such as the DASH diet and exercise as tolerated.  °

## 2017-12-18 NOTE — Assessment & Plan Note (Addendum)
Check labs 

## 2017-12-18 NOTE — Assessment & Plan Note (Signed)
hgba1c acceptable, minimize simple carbs. Increase exercise as tolerated. Continue current meds 

## 2017-12-18 NOTE — Assessment & Plan Note (Signed)
ghm utd Check labs See AVS 

## 2017-12-18 NOTE — Assessment & Plan Note (Signed)
Per pulm con't meds

## 2017-12-19 ENCOUNTER — Other Ambulatory Visit: Payer: Self-pay | Admitting: Family Medicine

## 2017-12-19 DIAGNOSIS — N644 Mastodynia: Secondary | ICD-10-CM

## 2017-12-23 ENCOUNTER — Other Ambulatory Visit: Payer: Self-pay | Admitting: Family Medicine

## 2017-12-23 DIAGNOSIS — E1165 Type 2 diabetes mellitus with hyperglycemia: Secondary | ICD-10-CM

## 2017-12-23 DIAGNOSIS — E1151 Type 2 diabetes mellitus with diabetic peripheral angiopathy without gangrene: Secondary | ICD-10-CM

## 2017-12-23 DIAGNOSIS — IMO0002 Reserved for concepts with insufficient information to code with codable children: Secondary | ICD-10-CM

## 2017-12-23 DIAGNOSIS — I10 Essential (primary) hypertension: Secondary | ICD-10-CM

## 2017-12-23 DIAGNOSIS — E785 Hyperlipidemia, unspecified: Secondary | ICD-10-CM

## 2017-12-24 ENCOUNTER — Other Ambulatory Visit: Payer: Self-pay | Admitting: *Deleted

## 2017-12-24 DIAGNOSIS — E785 Hyperlipidemia, unspecified: Secondary | ICD-10-CM

## 2017-12-24 DIAGNOSIS — I10 Essential (primary) hypertension: Secondary | ICD-10-CM

## 2017-12-25 ENCOUNTER — Other Ambulatory Visit: Payer: Self-pay | Admitting: Family Medicine

## 2017-12-25 DIAGNOSIS — IMO0002 Reserved for concepts with insufficient information to code with codable children: Secondary | ICD-10-CM

## 2017-12-25 DIAGNOSIS — E1165 Type 2 diabetes mellitus with hyperglycemia: Principal | ICD-10-CM

## 2017-12-25 DIAGNOSIS — E1151 Type 2 diabetes mellitus with diabetic peripheral angiopathy without gangrene: Secondary | ICD-10-CM

## 2017-12-30 ENCOUNTER — Ambulatory Visit (HOSPITAL_COMMUNITY): Admission: RE | Admit: 2017-12-30 | Payer: 59 | Source: Ambulatory Visit

## 2018-01-08 ENCOUNTER — Ambulatory Visit (HOSPITAL_COMMUNITY): Admission: RE | Admit: 2018-01-08 | Payer: 59 | Source: Ambulatory Visit

## 2018-01-21 ENCOUNTER — Other Ambulatory Visit: Payer: Self-pay | Admitting: Family Medicine

## 2018-01-21 DIAGNOSIS — N644 Mastodynia: Secondary | ICD-10-CM

## 2018-01-31 ENCOUNTER — Telehealth: Payer: Self-pay | Admitting: Family Medicine

## 2018-01-31 DIAGNOSIS — N644 Mastodynia: Secondary | ICD-10-CM

## 2018-01-31 NOTE — Telephone Encounter (Signed)
Copied from [place] [id number]. Topic: Quick Communication - See Telephone Encounter >> Jan 31, 2018  9:29 AM Bea Graff, NT wrote: CRM for notification. See Telephone encounter for: 01/31/18. Pt states her insurance will not approve the MRI of her breast and she is wanting to know if Dr. Carollee Herter will be ordering something else. She states she would like to know her options other than a biopsy.

## 2018-02-04 NOTE — Telephone Encounter (Signed)
Other imaging will not be covered---- other option is to see surgeon and let them exam pt--- does not necessarily mean bx

## 2018-02-05 NOTE — Telephone Encounter (Signed)
Patient states that she would like to be referred to a surgeon, if that is the last option for her. Please advise. Call back# [phone]

## 2018-02-05 NOTE — Telephone Encounter (Signed)
Left message on machine to call back  

## 2018-02-05 NOTE — Telephone Encounter (Signed)
Pt notified that we put in referral for surgeon.  Patient requested a female, noted in referral.

## 2018-02-25 ENCOUNTER — Other Ambulatory Visit: Payer: Self-pay | Admitting: Family Medicine

## 2018-02-26 ENCOUNTER — Ambulatory Visit: Payer: Self-pay | Admitting: Sports Medicine

## 2018-03-05 ENCOUNTER — Ambulatory Visit: Payer: 59 | Admitting: Sports Medicine

## 2018-03-14 ENCOUNTER — Ambulatory Visit: Payer: 59 | Admitting: Sports Medicine

## 2018-03-14 ENCOUNTER — Other Ambulatory Visit: Payer: Self-pay

## 2018-03-22 ENCOUNTER — Other Ambulatory Visit: Payer: Self-pay | Admitting: Family Medicine

## 2018-03-22 DIAGNOSIS — IMO0002 Reserved for concepts with insufficient information to code with codable children: Secondary | ICD-10-CM

## 2018-03-22 DIAGNOSIS — E1165 Type 2 diabetes mellitus with hyperglycemia: Principal | ICD-10-CM

## 2018-03-22 DIAGNOSIS — E1151 Type 2 diabetes mellitus with diabetic peripheral angiopathy without gangrene: Secondary | ICD-10-CM

## 2018-03-26 ENCOUNTER — Other Ambulatory Visit: Payer: Self-pay | Admitting: Family Medicine

## 2018-03-26 DIAGNOSIS — E785 Hyperlipidemia, unspecified: Secondary | ICD-10-CM

## 2018-03-26 DIAGNOSIS — IMO0002 Reserved for concepts with insufficient information to code with codable children: Secondary | ICD-10-CM

## 2018-03-26 DIAGNOSIS — E1165 Type 2 diabetes mellitus with hyperglycemia: Principal | ICD-10-CM

## 2018-03-26 DIAGNOSIS — I10 Essential (primary) hypertension: Secondary | ICD-10-CM

## 2018-03-26 DIAGNOSIS — E1151 Type 2 diabetes mellitus with diabetic peripheral angiopathy without gangrene: Secondary | ICD-10-CM

## 2018-04-08 ENCOUNTER — Other Ambulatory Visit: Payer: Self-pay | Admitting: Family Medicine

## 2018-04-08 DIAGNOSIS — I10 Essential (primary) hypertension: Secondary | ICD-10-CM

## 2018-04-08 DIAGNOSIS — K589 Irritable bowel syndrome without diarrhea: Secondary | ICD-10-CM

## 2018-04-09 ENCOUNTER — Telehealth: Payer: Self-pay | Admitting: *Deleted

## 2018-04-09 DIAGNOSIS — R110 Nausea: Secondary | ICD-10-CM

## 2018-04-09 MED ORDER — ONDANSETRON HCL 4 MG PO TABS: ORAL_TABLET | ORAL | 0 refills | Status: DC

## 2018-04-09 NOTE — Telephone Encounter (Signed)
Left message on machine to call back Need to double check to see if she has any family history of colon cancer.

## 2018-04-09 NOTE — Telephone Encounter (Signed)
Patient states that her maternal aunt had colon cancer.  Do you recommend GI?

## 2018-04-09 NOTE — Telephone Encounter (Signed)
Copied from [place] [id number]. Topic: General - Other >> Apr 08, 2018  1:21 PM Beth Shepard, [place] wrote: Reason for CRM: Patient received emmi prevent call about colo-rectal screening. Would like to be able to do this at home, if possible. Please advise.

## 2018-04-10 NOTE — Telephone Encounter (Signed)
Patient advised she can do cologuard, order will be faxed for her to receive test in mail.

## 2018-04-10 NOTE — Telephone Encounter (Signed)
I believe only first degree relatives with colon cancer would exclude her from being able to do cologuard

## 2018-04-21 ENCOUNTER — Telehealth: Payer: Self-pay | Admitting: Family Medicine

## 2018-04-21 NOTE — Telephone Encounter (Signed)
Copied from [place] [id number]. Topic: Quick Communication - See Telephone Encounter >> Apr 21, 2018 11:24 AM Blase Mess A wrote: CRM for notification. See Telephone encounter for: 04/21/18.  Patient had a fall on (10/28) her shoulder blades are hurting bad.  Dr. Carollee Herter did not have any immediate appts Patient is wanting to know if Dr. Carollee Herter will have to send her to a specialist? Please advise [phone]

## 2018-04-21 NOTE — Telephone Encounter (Signed)
Any recommendations or you need to see her?

## 2018-04-22 ENCOUNTER — Encounter (INDEPENDENT_AMBULATORY_CARE_PROVIDER_SITE_OTHER): Payer: Self-pay | Admitting: Physician Assistant

## 2018-04-22 ENCOUNTER — Ambulatory Visit (INDEPENDENT_AMBULATORY_CARE_PROVIDER_SITE_OTHER): Payer: 59 | Admitting: Physician Assistant

## 2018-04-22 ENCOUNTER — Encounter (INDEPENDENT_AMBULATORY_CARE_PROVIDER_SITE_OTHER): Payer: Self-pay | Admitting: Orthopedic Surgery

## 2018-04-22 ENCOUNTER — Ambulatory Visit (INDEPENDENT_AMBULATORY_CARE_PROVIDER_SITE_OTHER): Payer: Self-pay

## 2018-04-22 VITALS — Ht 61.81 in | Wt 204.2 lb

## 2018-04-22 DIAGNOSIS — R0781 Pleurodynia: Secondary | ICD-10-CM

## 2018-04-22 MED ORDER — HYDROCODONE-ACETAMINOPHEN 5-325 MG PO TABS: 1.0000 | ORAL_TABLET | Freq: Every evening | ORAL | 0 refills | Status: DC | PRN

## 2018-04-22 NOTE — Telephone Encounter (Signed)
Patient was seen by ortho today.  Call tomorrow 04/23/18 to check on patient.

## 2018-04-22 NOTE — Progress Notes (Signed)
Office Visit Note   Patient: Beth Shepard           Date of Birth: [birth year]           MRN: [id number] Visit Date: 04/22/2018              Requested by: [address] [place], [address] [place], [place] [address] PCP: Carollee Herter, Alferd Apa, DO  Chief Complaint  Patient presents with  . Right Shoulder - Pain    Patient fell 2 weeks ago; c/o (right side) mid back to upper  Right chest area hurting. C/o shoulder blade pain.       HPI: Patient is a 62 year old female who reports that she tripped over an electrical cord 2 weeks ago and fell onto a carpeted floor.  She fell onto her left side and reports that following this her chest area hurt.  She points to the right side of her chest but reports that she did not strike the right side of her chest on anything.  She reports she continued to work last week and felt she was getting better.  She initially had to sleep in a recliner chair.  She took some ibuprofen and this really did not help but her daughter did give her hydrocodone and she was finally able to get some rest.  She reports that then she developed some pickups in her right sided chest discomfort worsened and she noted severe pain when she coughed or sneezed.  She points to the right anterior chest wall as well as the axilla on the right side and reports that it also hurts when she moves her right arm following the injury.  She reports no pain over her left hip.  She feels like she heard some cracking or popping in her chest yesterday when she coughed or sneezed hard and reports that she has not been able to get comfortable since yesterday until she took the hydrocodone.  Assessment & Plan: Visit Diagnoses:  1. Rib pain on right side     Plan: Counseled the patient that she might have minimally fractured her right fifth rib in the fall.  There does not appear to be any displacement.  We discussed that the treatment for this was symptomatic and that she She  will need to allow for several weeks for healing.  We discussed that it is good to continue to mobilize and coughing and deep breathing was important to clear pulmonary secretions.  She felt she needed something more than just ibuprofen or Tylenol for pain and I did give her prescription for Norco 5/325 mg 1 p.o. nightly as needed for pain at bedtime only as she intends to try to continue to work.  We did allow for her to have more frequent work breaks with 15-minute breaks every 2 hours while at work.  She will follow-up here in 2 weeks for recheck or sooner should she have further difficulties in the interim.   Follow-Up Instructions: Return in about 2 weeks (around 05/06/2018).   Ortho Exam  Patient is alert, oriented, no adenopathy, well-dressed, normal affect, normal respiratory effort. Patient ambulates with a cane and a female minimally antalgic appearing gait.  She has no tenderness over the left hip or left shoulder or chest wall.  She is tender anteriorly over the right chest wall and over the axilla as well as over the posterior chest wall in the mid chest region.  She reports pain with  right upper extremity movement but does have good functional movement of the right upper extremity.  She is neurovascular intact in the right extremity.  Lungs are clear.  Cardiac is regular rate and rhythm.  There is no observed ecchymosis over the chest.  Imaging: Xr Chest 2 View  Result Date: 04/22/2018 ? Early healing or old fracture of right 5 th rib. No displacement. Severe degenerative arthritis of thoracic spine with dorsal kyphosis and anterior spurring without signs of acute vertebral fractures and lumbar spine degenerative changes as well without signs of vertebral fracture.  No images are attached to the encounter.  Labs: Lab Results  Component Value Date   HGBA1C 7.9 (H) 12/17/2017   HGBA1C 9.0 (H) 07/19/2017   HGBA1C 8.1 (H) 11/16/2016   ESRSEDRATE 17 08/28/2016   ESRSEDRATE 40 (H)  10/14/2015   ESRSEDRATE 31 (H) 11/13/2012   CRP 0.5 10/14/2015   LABORGA NO GROWTH 01/13/2016     Lab Results  Component Value Date   ALBUMIN 4.3 12/17/2017   ALBUMIN 4.2 07/19/2017   ALBUMIN 4.2 11/16/2016    Body mass index is 37.58 kg/m.  Orders:  Orders Placed This Encounter  Procedures  . XR Chest 2 View   Meds ordered this encounter  Medications  . HYDROcodone-acetaminophen (NORCO/VICODIN) 5-325 MG tablet    Sig: Take 1 tablet by mouth at bedtime as needed for moderate pain.    Dispense:  15 tablet    Refill:  0     Procedures: No procedures performed  Clinical Data: No additional findings.  ROS:  All other systems negative, except as noted in the HPI. Review of Systems  Objective: Vital Signs: Ht 5' 1.81" (1.57 m)   Wt 204 lb 3.2 oz (92.6 kg)   BMI 37.58 kg/m   Specialty Comments:  No specialty comments available.  PMFS History: Patient Active Problem List   Diagnosis Date Noted  . Preventative health care 12/18/2017  . Infected sebaceous cyst of skin 07/19/2017  . DCM (dilated cardiomyopathy) ([place]) 07/10/2017  . Fibromyalgia 04/05/2017  . Chronic pain syndrome 04/05/2017  . Pain in both lower extremities 02/06/2017  . Chronic venous insufficiency 02/06/2017  . Cellulitis of lower extremity 08/28/2016  . Pain in right ankle and joints of right foot 08/09/2016  . Peripheral edema 07/10/2016  . Trochanteric bursitis, right hip 05/03/2016  . Chronic kidney disease (CKD) stage G3a/A1, moderately decreased glomerular filtration rate (GFR) between 45-59 mL/min/1.73 square meter and albuminuria creatinine ratio less than 30 mg/g (HCC) 04/07/2016  . Primary osteoarthritis of right hip 10/24/2015  . Hip joint replacement status 10/24/2015  . Chest pain 08/25/2015  . Right hip pain 08/25/2015  . Chest pain, precordial 08/10/2015  . Precordial pain 08/10/2015  . Cardiomyopathy ([place]) 01/18/2015  . Block, bundle branch, left 01/18/2015  . Left  bundle-branch block 01/18/2015  . Diabetes ([place]) 01/18/2015  . Nausea alone 12/17/2013  . Diarrhea 11/19/2013  . Bloating 11/19/2013  . Obesity (BMI 30-39.9) 10/21/2013  . Multiple joint pain 11/13/2012  . MYALGIA 08/29/2010  . ABDOMINAL PAIN OTHER SPECIFIED SITE 08/22/2009  . OSTEOARTHROS UNSPEC WHETHER GEN/LOC UNSPEC SITE 03/17/2009  . Cellulitis and abscess of face 12/17/2008  . Disturbance of skin sensation 12/17/2008  . ACUTE BRONCHITIS 05/07/2008  . Anxiety 12/04/2007  . SINUSITIS, ACUTE NOS 12/19/2006  . DM (diabetes mellitus) type II uncontrolled, periph vascular disorder ([place]) 10/08/2006  . Hyperlipidemia LDL goal <70 10/08/2006  . Essential hypertension 10/08/2006  . COPD (chronic obstructive pulmonary  disease) ([place]) 10/08/2006  . GERD 10/08/2006  . Other postprocedural status(V45.89) 10/08/2006   Past Medical History:  Diagnosis Date  . Anxiety   . Bilateral edema of lower extremity    Swelling in legs and ankles  . Congestive heart failure ([place])   . COPD (chronic obstructive pulmonary disease) ([place])   . Diabetes mellitus    type 2  . Gastric polyps   . GERD (gastroesophageal reflux disease)    chronic  . Hyperlipidemia   . Hypertension   . IBS (irritable bowel syndrome)   . LBBB (left bundle branch block)    Dr. Gwenith Spitz, [facility]  . Pneumonia    hx of  . PONV (postoperative nausea and vomiting)    Had N/V after having tubal ligation and D&C  . Rheumatoid arthritis ([place])   . Shingles     Family History  Problem Relation Age of Onset  . Lymphoma Mother   . Heart disease Mother        chf, cabg x6  . Hypertension Mother   . GER disease Mother   . Hiatal hernia Mother   . Gallstones Mother   . Cancer Sister        brain stage 4  . Heart disease Maternal Grandmother 108       MI  . Heart disease Maternal Grandfather   . Stroke Paternal Grandmother   . Hypertension Paternal Grandmother   . Stroke Paternal Grandfather   .  Hypertension Paternal Grandfather   . Rheum arthritis Father   . Hypertension Daughter   . Colon cancer Neg Hx   . Esophageal cancer Neg Hx   . Pancreatic cancer Neg Hx   . Liver disease Neg Hx   . Kidney disease Neg Hx     Past Surgical History:  Procedure Laterality Date  . CARDIAC CATHETERIZATION     No PCI  . CHOLECYSTECTOMY    . COLONOSCOPY  06/06/2005   normal   . DILATION AND CURETTAGE OF UTERUS    . ESOPHAGOGASTRODUODENOSCOPY  06/06/2005, 11/17/2013  . JOINT REPLACEMENT Right 10/24/2015   rt hip replacement  . KNEE ARTHROSCOPY Left   . TOTAL HIP ARTHROPLASTY Right 10/24/2015   Procedure: RIGHT TOTAL HIP ARTHROPLASTY ANTERIOR APPROACH;  Surgeon: Leandrew Koyanagi, MD;  Location: [facility];  Service: Orthopedics;  Laterality: Right;  . TUBAL LIGATION    . VAGINAL HYSTERECTOMY     Social History   Occupational History  . Occupation: Administive     Employer: [employer]  Tobacco Use  . Smoking status: Former Smoker    Years: 33.00    Types: Cigarettes    Last attempt to quit: 06/25/2002    Years since quitting: 15.8  . Smokeless tobacco: Never Used  Substance and Sexual Activity  . Alcohol use: Yes    Comment: rare  . Drug use: No  . Sexual activity: Not Currently    Partners: Male

## 2018-04-22 NOTE — Telephone Encounter (Signed)
She needs to be seen ----  Does anyone else have app? Can refer to [facility] if no.

## 2018-04-24 ENCOUNTER — Ambulatory Visit (INDEPENDENT_AMBULATORY_CARE_PROVIDER_SITE_OTHER): Payer: 59 | Admitting: Physician Assistant

## 2018-04-24 ENCOUNTER — Ambulatory Visit: Payer: 59 | Admitting: Family Medicine

## 2018-05-01 ENCOUNTER — Encounter: Payer: Self-pay | Admitting: Family Medicine

## 2018-05-02 ENCOUNTER — Telehealth: Payer: Self-pay | Admitting: *Deleted

## 2018-05-02 NOTE — Telephone Encounter (Signed)
Left message on machine to call back Do not see any notes from yesterday phone call.  Not sure what it was about.

## 2018-05-02 NOTE — Telephone Encounter (Signed)
Copied from [place] [phone]. Topic: General - Other >> May 01, 2018  9:05 AM Bea Graff, NT wrote: Reason for CRM: Pt states someone at the office just called her but they had a bad connection. Pt is returning call.

## 2018-05-07 ENCOUNTER — Ambulatory Visit (INDEPENDENT_AMBULATORY_CARE_PROVIDER_SITE_OTHER): Payer: 59 | Admitting: Orthopaedic Surgery

## 2018-05-16 ENCOUNTER — Ambulatory Visit (INDEPENDENT_AMBULATORY_CARE_PROVIDER_SITE_OTHER): Payer: 59 | Admitting: Orthopaedic Surgery

## 2018-05-28 ENCOUNTER — Ambulatory Visit (INDEPENDENT_AMBULATORY_CARE_PROVIDER_SITE_OTHER): Payer: 59 | Admitting: Orthopaedic Surgery

## 2018-06-06 ENCOUNTER — Other Ambulatory Visit: Payer: Self-pay | Admitting: Family Medicine

## 2018-06-06 DIAGNOSIS — E1151 Type 2 diabetes mellitus with diabetic peripheral angiopathy without gangrene: Secondary | ICD-10-CM

## 2018-06-06 DIAGNOSIS — E1165 Type 2 diabetes mellitus with hyperglycemia: Principal | ICD-10-CM

## 2018-06-06 DIAGNOSIS — IMO0002 Reserved for concepts with insufficient information to code with codable children: Secondary | ICD-10-CM

## 2018-06-10 ENCOUNTER — Other Ambulatory Visit: Payer: Self-pay | Admitting: *Deleted

## 2018-06-23 ENCOUNTER — Other Ambulatory Visit: Payer: Self-pay | Admitting: Family Medicine

## 2018-06-23 DIAGNOSIS — I10 Essential (primary) hypertension: Secondary | ICD-10-CM

## 2018-06-23 DIAGNOSIS — E1151 Type 2 diabetes mellitus with diabetic peripheral angiopathy without gangrene: Secondary | ICD-10-CM

## 2018-06-23 DIAGNOSIS — IMO0002 Reserved for concepts with insufficient information to code with codable children: Secondary | ICD-10-CM

## 2018-06-23 DIAGNOSIS — E1165 Type 2 diabetes mellitus with hyperglycemia: Principal | ICD-10-CM

## 2018-06-23 DIAGNOSIS — E785 Hyperlipidemia, unspecified: Secondary | ICD-10-CM

## 2018-06-23 NOTE — Telephone Encounter (Signed)
Copied from [place]. Topic: Quick Communication - Rx Refill/Question >> Jun 23, 2018  2:19 PM Burchel, Abbi R wrote: Medication: ALPRAZolam (XANAX) 0.25 MG tablet   Preferred Pharmacy: [id number] - [place], [place] - [address]  [phone] (Phone) [phone] (Fax)  Pt was advised that RX refills may take up to 3 business days. We ask that you follow-up with your pharmacy.

## 2018-06-27 NOTE — Telephone Encounter (Signed)
Database ran and is on your desk for review.  Last filled per database: 03/27/18 Last written: 03/27/18 Last ov: 12/17/17 Next ov: none Contract: due  UDS: due

## 2018-07-03 ENCOUNTER — Other Ambulatory Visit: Payer: Self-pay | Admitting: Family Medicine

## 2018-07-03 DIAGNOSIS — E1165 Type 2 diabetes mellitus with hyperglycemia: Secondary | ICD-10-CM

## 2018-07-03 DIAGNOSIS — E1151 Type 2 diabetes mellitus with diabetic peripheral angiopathy without gangrene: Secondary | ICD-10-CM

## 2018-07-03 DIAGNOSIS — E785 Hyperlipidemia, unspecified: Secondary | ICD-10-CM

## 2018-07-03 DIAGNOSIS — IMO0002 Reserved for concepts with insufficient information to code with codable children: Secondary | ICD-10-CM

## 2018-07-03 DIAGNOSIS — I10 Essential (primary) hypertension: Secondary | ICD-10-CM

## 2018-07-07 ENCOUNTER — Other Ambulatory Visit: Payer: Self-pay | Admitting: Family Medicine

## 2018-07-07 DIAGNOSIS — E785 Hyperlipidemia, unspecified: Secondary | ICD-10-CM

## 2018-07-07 DIAGNOSIS — I10 Essential (primary) hypertension: Secondary | ICD-10-CM

## 2018-07-07 DIAGNOSIS — E1151 Type 2 diabetes mellitus with diabetic peripheral angiopathy without gangrene: Secondary | ICD-10-CM

## 2018-07-07 DIAGNOSIS — E1165 Type 2 diabetes mellitus with hyperglycemia: Principal | ICD-10-CM

## 2018-07-07 DIAGNOSIS — IMO0002 Reserved for concepts with insufficient information to code with codable children: Secondary | ICD-10-CM

## 2018-07-26 ENCOUNTER — Other Ambulatory Visit: Payer: Self-pay | Admitting: Family Medicine

## 2018-09-05 ENCOUNTER — Other Ambulatory Visit: Payer: Self-pay | Admitting: Family Medicine

## 2018-09-05 ENCOUNTER — Telehealth: Payer: Self-pay

## 2018-09-05 ENCOUNTER — Ambulatory Visit: Payer: 59 | Admitting: Family Medicine

## 2018-09-05 DIAGNOSIS — K580 Irritable bowel syndrome with diarrhea: Secondary | ICD-10-CM

## 2018-09-05 DIAGNOSIS — K219 Gastro-esophageal reflux disease without esophagitis: Secondary | ICD-10-CM

## 2018-09-05 NOTE — Telephone Encounter (Signed)
Copied from [place] [phone]. Topic: Referral - Request for Referral >> Sep 05, 2018  9:08 AM Sheran Luz wrote: Has patient seen PCP for this complaint? Yes Referral for which specialty: Gertie Fey  Preferred provider/office: Anywhere EXCEPT Dr. Terance Hart at [facility]  Reason for referral: gastro issues    Requesting per appt note to cancel appointment due to CV

## 2018-09-05 NOTE — Telephone Encounter (Signed)
What gastro issues----  IBS  ?  , nausea?  There is no dr Terance Hart with 

## 2018-09-05 NOTE — Telephone Encounter (Signed)
LMOM informing Pt to return call. Okay for PEC to discuss.  

## 2018-09-05 NOTE — Telephone Encounter (Signed)
Pt states she is having acid reflux really bad, burning in her stomach, diarrhea, stomach cramping, IBS especially bad.  Pt has had stool leakage that she cannot control.  protonix not helping or hyoscyamine (LEVBID) 0.375 MG 12 hr tablet  Pt states this has been going on for a long time. Years ago she though the dr was Dr Terance Hart, but so long ago he has probably retired. This issue has gotten worse over the last 2 months.  Not sure even what she should eat right now.

## 2018-09-11 NOTE — Telephone Encounter (Signed)
Referral placed.

## 2018-09-22 ENCOUNTER — Telehealth: Payer: Self-pay | Admitting: Emergency Medicine

## 2018-09-22 NOTE — Telephone Encounter (Signed)
I am sorry that she is unavailable for WebEx or Zoom consultation. Please offer her a face-to-face appointment when the Covid restrictions are released. In the meantime, she should continue to followup with her PCP. Thank you.

## 2018-09-22 NOTE — Telephone Encounter (Signed)
Patient scheduled for appointment for GERD on 03-31. Attempted to set up patient up for webex or zoom appointment, she states she does not have an email address and would not be able to do that at this time. Due to not COVID19 restrictions please advise what the best course of action would be at this time.

## 2018-09-23 ENCOUNTER — Other Ambulatory Visit: Payer: Self-pay

## 2018-09-23 ENCOUNTER — Ambulatory Visit (INDEPENDENT_AMBULATORY_CARE_PROVIDER_SITE_OTHER): Payer: 59 | Admitting: Gastroenterology

## 2018-09-23 ENCOUNTER — Encounter: Payer: Self-pay | Admitting: Gastroenterology

## 2018-09-23 DIAGNOSIS — R197 Diarrhea, unspecified: Secondary | ICD-10-CM | POA: Diagnosis not present

## 2018-09-23 DIAGNOSIS — R1310 Dysphagia, unspecified: Secondary | ICD-10-CM

## 2018-09-23 DIAGNOSIS — R1084 Generalized abdominal pain: Secondary | ICD-10-CM

## 2018-09-23 MED ORDER — PANTOPRAZOLE SODIUM 40 MG PO TBEC: 40.0000 mg | DELAYED_RELEASE_TABLET | Freq: Every day | ORAL | 3 refills | Status: DC

## 2018-09-23 NOTE — Patient Instructions (Addendum)
I have recommended a few tests: - Fecal calprotectin, ESR, and CRP to screen for IBD - Giardia testing - TSH  I recommend a trial of PeptoBismal 3 tablets daily (after you submit the stool tests).  I recommend that you increase your pantoprazole to 40 mg daily.  I have recommended an upper endoscopy with duodenal biopsies and a colonoscopy random biopsies and evaluation of the terminal ileum. We will contact you to schedule these procedures with the Covid19 restrictions have been lifted.   Thank you for your patience with me and our technology today! Please stay home, safe, and healthy. I look forward to meeting you in person in the future.

## 2018-09-23 NOTE — Progress Notes (Addendum)
TELEHEALTH VISIT  Referring Provider: Ann Held, * Primary Care Physician:  Carollee Herter, Alferd Apa, DO   Tele-visit due to COVID-19 pandemic Patient requested visit virtually, consented to the virtual encounter via [place] made at: 14:18 09/23/18 Patient verified by name and date of birth Location of patient: Home Location provider: My medical office Names of persons participating: Me, patient Time spent on telehealth visit: 25 minutes  Reason for Consultation: IBS with diarrhea   IMPRESSION:  Abdominal pain with associated diarrhea    -  Treated with ampicillin for possible  SIBO 2014    - Trial of questran twice a day for diarrhea    -  previously treated with Hyomax BID  Dysphasia to solids  Chronic symptoms without alarm features.  Previously diagnosed with IBS but I cannot see where she was evaluated for IBS mimickers.  Differential is broad and includes irritable bowel syndrome, IBD, missed infection (such as giardia), food intolerance, microscopic colitis, thyroid disorder, SIBO, other functional GI disease.   PLAN:TID - Fecal calprotectin, ESR, and CRP to screen for IBD - Increase pantoprazole to 40 mg daily - Trial of PeptoBismal 3 tabs (after stool tests) - Continue hyoscyamine - Giardia testing - TSH - IgA tissue transglutaminase, IgA level to test for celiac disease - EGD with duodenal biopsies given the patient's prior medical care - Colonoscopy with random biopsies and evaluation of the terminal ileum  Proceed with endoscopic evaluation when the coronavirus restrictions have been lifted.  I consented the patient today discussing the risks, benefits, and alternatives to endoscopic evaluation. In particular, we discussed the risks that include, but are not limited to, reaction to medication, cardiopulmonary compromise, bleeding requiring blood transfusion, aspiration resulting in pneumonia, perforation requiring surgery, and even death. We  reviewed the risk of missed lesion including polyps or even cancer. The patient acknowledges these risks and asks that we proceed.   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  The history is obtained to the patient and review of her electronic health record.  She feels that her IBS is causing problems with diarrhea, abdominal pain. Remembers similar symptoms during her evaluation with Dr. [name] in 2006 and 2015.  Four different shades of stools - bright orange, to green, to yellow, and occasionally black.Often pure liquid.  Her bowel habits range from 1 or 2 bowel movements a day to once or twice hourly during awake hours. Occassional nocturnal symptoms. Nocturnal accidents.  Keeps extra underwear with her because of the unpredictability of her symptoms.   Pain in her chest, stomach, lower abdominal cramps. Intermittently. Not daily, but most days.  Diet changes have not made any difference. She has had to miss work because of flares.  Weight is stable.  Appetite is good.  Longstanding reflux. Currently on Protonix. Multiple PPIs in the past. Aciphex was the best. . Some dysphagia. Strangles on her own saliva. Awakes in the morning and feels like she can't swallow or breath. Once she spits up she is better.  Problem is it feels like it won't go up and won't go down. She can't "hock a looby." Takes hyoscamine. Uses Zofran PRN for nausea.   Ocassional abdominal burning, cramps that feels like menses.  No other associated symptoms. No identified exacerbating or relieving features.   She has had some intermittent bleeding especially after multiple BMs.   Mother had similar difficulties and would strangle for no reason. Would need  water so she would not strangle.   Anxious about the unpredictable nature of her symptoms.   Last seen by Dr. [name] and PA Zehr 12/17/2013.  She was  placed on a course of ampicillin for SIBO and treated with questran  twice a day for diarrhea.  She still has some abdominal cramping and takes the Hyomax BID, which she needs refilled.    Last colonoscopy was in 05/2005 by Dr. Sharlett Iles and was normal at that time (had a very long, redundant colon). Previously seen by Dr. Melina Copa before Dr. [name].   No known family history of colon cancer or polyps. No family history of uterine/endometrial cancer, pancreatic cancer or gastric/stomach cancer.  Past Medical History:  Diagnosis Date  . Anxiety   . Bilateral edema of lower extremity    Swelling in legs and ankles  . Congestive heart failure ([place])   . COPD (chronic obstructive pulmonary disease) ([place])   . Diabetes mellitus    type 2  . Gastric polyps   . GERD (gastroesophageal reflux disease)    chronic  . Hyperlipidemia   . Hypertension   . IBS (irritable bowel syndrome)   . LBBB (left bundle branch block)    Dr. Gwenith Spitz, [facility]  . Pneumonia    hx of  . PONV (postoperative nausea and vomiting)    Had N/V after having tubal ligation and D&C  . Rheumatoid arthritis ([place])   . Shingles     Past Surgical History:  Procedure Laterality Date  . CARDIAC CATHETERIZATION     No PCI  . CHOLECYSTECTOMY    . COLONOSCOPY  06/06/2005   normal   . DILATION AND CURETTAGE OF UTERUS    . ESOPHAGOGASTRODUODENOSCOPY  06/06/2005, 11/17/2013  . JOINT REPLACEMENT Right 10/24/2015   rt hip replacement  . KNEE ARTHROSCOPY Left   . TOTAL HIP ARTHROPLASTY Right 10/24/2015   Procedure: RIGHT TOTAL HIP ARTHROPLASTY ANTERIOR APPROACH;  Surgeon: Leandrew Koyanagi, MD;  Location: [facility];  Service: Orthopedics;  Laterality: Right;  . TUBAL LIGATION    . VAGINAL HYSTERECTOMY      Current Outpatient Medications  Medication Sig Dispense Refill  . Alogliptin Benzoate 25 MG TABS TAKE 1 TABLET BY MOUTH EVERY DAY 90 tablet 0  . ALPRAZolam (XANAX) 0.25 MG tablet TAKE 1 TABLET BY MOUTH THREE TIMES A DAY AS NEEDED 90 tablet 0  . aspirin 81 MG tablet Take 81 mg by  mouth daily.    . Aspirin-Calcium Carbonate 81-777 MG TABS Take by mouth.    Marland Kitchen atorvastatin (LIPITOR) 20 MG tablet Take 1 tablet (20 mg total) by mouth daily. 90 tablet 1  . Calcium Carb-Cholecalciferol (CALCIUM-VITAMIN D) 500-200 MG-UNIT tablet Take by mouth.    . carvedilol (COREG) 12.5 MG tablet Take 1 tablet (12.5 mg total) by mouth 2 (two) times daily. 180 tablet 1  . fenofibrate 160 MG tablet Take 1 tablet (160 mg total) by mouth daily. 30 tablet 2  . furosemide (LASIX) 20 MG tablet TAKE 1 TABLET BY MOUTH EVERY DAY 90 tablet 1  . gabapentin (NEURONTIN) 100 MG capsule TAKE 1 CAPSULE BY MOUTH THREE TIMES A DAY 270 capsule 2  . glimepiride (AMARYL) 4 MG tablet TAKE 1 TABLET (4 MG TOTAL) BY MOUTH DAILY BEFORE BREAKFAST. 90 tablet 0  . glucose blood (ONE TOUCH TEST STRIPS) test strip 1 each by Other route as needed. Reported on 11/02/2015    . HYDROcodone-acetaminophen (NORCO/VICODIN) 5-325 MG tablet Take 1 tablet by mouth  at bedtime as needed for moderate pain. 15 tablet 0  . hyoscyamine (LEVBID) 0.375 MG 12 hr tablet TAKE 1 TABLET (0.375 MG TOTAL) BY MOUTH 2 (TWO) TIMES DAILY. 180 tablet 0  . levocetirizine (XYZAL) 5 MG tablet TAKE 1 TABLET (5 MG TOTAL) BY MOUTH EVERY EVENING. 90 tablet 1  . lisinopril (PRINIVIL,ZESTRIL) 20 MG tablet TAKE 1 TABLET BY MOUTH EVERY DAY 90 tablet 1  . meloxicam (MOBIC) 7.5 MG tablet Take 1 tablet (7.5 mg total) by mouth daily as needed for pain. 90 tablet 1  . methocarbamol (ROBAXIN) 500 MG tablet Take by mouth.    . mupirocin ointment (BACTROBAN) 2 % Place 1 application into the nose 2 (two) times daily. 22 g 0  . omega-3 acid ethyl esters (LOVAZA) 1 g capsule Take 2 g by mouth.    . ondansetron (ZOFRAN) 4 MG tablet TAKE 1-2 TABLETS BY MOUTH EVERY 8 HOURS AS NEEDED FOR NAUSEA 40 tablet 0  . pantoprazole (PROTONIX) 20 MG tablet TAKE 1 TABLET BY MOUTH EVERY DAY 90 tablet 1  . simvastatin (ZOCOR) 40 MG tablet Take by mouth.    . spironolactone (ALDACTONE) 25 MG  tablet Take 1 tablet (25 mg total) by mouth every other day. Dr Ola Spurr    . tizanidine (ZANAFLEX) 2 MG capsule Take 1 capsule (2 mg total) by mouth 2 (two) times daily as needed for muscle spasms. 60 capsule 1  . traMADol (ULTRAM) 50 MG tablet      No current facility-administered medications for this visit.     Allergies as of 09/23/2018 - Review Complete 04/22/2018  Allergen Reaction Noted  . Prednisone Palpitations and Other (See Comments) 09/03/2011  . Codeine Nausea Only and Palpitations 10/12/2005  . Ibuprofen Other (See Comments) and Rash 10/12/2005    Family History  Problem Relation Age of Onset  . Lymphoma Mother   . Heart disease Mother        chf, cabg x6  . Hypertension Mother   . GER disease Mother   . Hiatal hernia Mother   . Gallstones Mother   . Cancer Sister        brain stage 4  . Heart disease Maternal Grandmother 45       MI  . Heart disease Maternal Grandfather   . Stroke Paternal Grandmother   . Hypertension Paternal Grandmother   . Stroke Paternal Grandfather   . Hypertension Paternal Grandfather   . Rheum arthritis Father   . Hypertension Daughter   . Colon cancer Neg Hx   . Esophageal cancer Neg Hx   . Pancreatic cancer Neg Hx   . Liver disease Neg Hx   . Kidney disease Neg Hx     Social History   Socioeconomic History  . Marital status: Widowed    Spouse name: Not on file  . Number of children: 2  . Years of education: Not on file  . Highest education level: Not on file  Occupational History  . Occupation: Administive     Employer: [employer]  Social Needs  . Financial resource strain: Not on file  . Food insecurity:    Worry: Not on file    Inability: Not on file  . Transportation needs:    Medical: Not on file    Non-medical: Not on file  Tobacco Use  . Smoking status: Former Smoker    Years: 33.00    Types: Cigarettes    Last attempt to quit: 06/25/2002    Years  since quitting: 16.2  . Smokeless tobacco:  Never Used  Substance and Sexual Activity  . Alcohol use: Yes    Comment: rare  . Drug use: No  . Sexual activity: Not Currently    Partners: Male  Lifestyle  . Physical activity:    Days per week: Not on file    Minutes per session: Not on file  . Stress: Not on file  Relationships  . Social connections:    Talks on phone: Not on file    Gets together: Not on file    Attends religious service: Not on file    Active member of club or organization: Not on file    Attends meetings of clubs or organizations: Not on file    Relationship status: Not on file  . Intimate partner violence:    Fear of current or ex partner: Not on file    Emotionally abused: Not on file    Physically abused: Not on file    Forced sexual activity: Not on file  Other Topics Concern  . Not on file  Social History Narrative   Limited exercise per cardiology    Review of Systems: ALL ROS discussed and all others negative except listed in HPI.  Physical Exam: General: in no acute distress Neuro: Alert and appropriate Psych: Normal affect and normal insight Sickle exam otherwise limited by [employer].   Jeison Delpilar L. Tarri Glenn, MD, MPH [facility] 09/23/2018, 1:04 PM

## 2018-09-23 NOTE — Telephone Encounter (Signed)
Left message on patients voicemail to call office. Left message on work phone as well.

## 2018-09-23 NOTE — Telephone Encounter (Signed)
Pt returned your call, she apologized because she stated that she was speaking with you but had to hung up as IT person was calling her to help her set up her computer since she is working from home. Pls call her again.

## 2018-09-23 NOTE — Telephone Encounter (Signed)
Pt called back and will like a return call.She will like to know if you can call her at 2pm its her lunch time and she will be avail.

## 2018-09-24 NOTE — Telephone Encounter (Signed)
Spoke with patient, she had her appointment with Dr. Tarri Glenn.

## 2018-09-26 ENCOUNTER — Ambulatory Visit: Payer: 59 | Admitting: Gastroenterology

## 2018-10-01 ENCOUNTER — Other Ambulatory Visit: Payer: Self-pay | Admitting: *Deleted

## 2018-10-01 ENCOUNTER — Other Ambulatory Visit (INDEPENDENT_AMBULATORY_CARE_PROVIDER_SITE_OTHER): Payer: 59

## 2018-10-01 DIAGNOSIS — R197 Diarrhea, unspecified: Secondary | ICD-10-CM

## 2018-10-01 DIAGNOSIS — E1165 Type 2 diabetes mellitus with hyperglycemia: Principal | ICD-10-CM

## 2018-10-01 DIAGNOSIS — E1151 Type 2 diabetes mellitus with diabetic peripheral angiopathy without gangrene: Secondary | ICD-10-CM

## 2018-10-01 DIAGNOSIS — IMO0002 Reserved for concepts with insufficient information to code with codable children: Secondary | ICD-10-CM

## 2018-10-01 DIAGNOSIS — R1084 Generalized abdominal pain: Secondary | ICD-10-CM

## 2018-10-01 LAB — C-REACTIVE PROTEIN: CRP: 1.7 mg/dL (ref 0.5–20.0)

## 2018-10-01 LAB — TSH: TSH: 2.63 u[IU]/mL (ref 0.35–4.50)

## 2018-10-01 LAB — SEDIMENTATION RATE: Sed Rate: 58 mm/hr — ABNORMAL HIGH (ref 0–30)

## 2018-10-01 MED ORDER — GLIMEPIRIDE 4 MG PO TABS: 4.0000 mg | ORAL_TABLET | Freq: Every day | ORAL | 1 refills | Status: DC

## 2018-10-06 ENCOUNTER — Other Ambulatory Visit: Payer: Self-pay | Admitting: Family Medicine

## 2018-10-06 DIAGNOSIS — IMO0002 Reserved for concepts with insufficient information to code with codable children: Secondary | ICD-10-CM

## 2018-10-06 DIAGNOSIS — E785 Hyperlipidemia, unspecified: Secondary | ICD-10-CM

## 2018-10-06 DIAGNOSIS — E1165 Type 2 diabetes mellitus with hyperglycemia: Principal | ICD-10-CM

## 2018-10-06 DIAGNOSIS — E1151 Type 2 diabetes mellitus with diabetic peripheral angiopathy without gangrene: Secondary | ICD-10-CM

## 2018-10-06 DIAGNOSIS — I10 Essential (primary) hypertension: Secondary | ICD-10-CM

## 2018-10-07 NOTE — Telephone Encounter (Signed)
Last written: 06/27/18 Last ov: 09/23/18 Next ov: 11/14/18 Contract: can get at 11/14/18 visit UDS: can get at 11/14/18 visit

## 2018-11-14 ENCOUNTER — Encounter: Payer: 59 | Admitting: Family Medicine

## 2018-11-28 ENCOUNTER — Ambulatory Visit: Payer: Self-pay

## 2018-11-28 NOTE — Telephone Encounter (Signed)
Pt called to ask if a medication Bactroban ointment is still goo with a date of 6/20 for expiration. Pt will call pharmacist for advice. Pt states she is using the ointment to treat her nose which has been historically treated for shingles.  She states that she has sinus issues and blow her nose often. Her left nare and nostril is red and very painful to touch today. She states he Right nare is slightly red and tender but not swollen.  She has been using the bactroban ointment. She states that she developed shingles to the area and was treated with medications including the bactroban ointment 2 other times.  She has already been in contact with the office today and has a scheduled appointment Monday.  She is unable to be there today because of her work schedule and does not want appointment today. Care advice read to patient. Patient verbalized understanding.  Reason for Disposition . [1] Shingles rash (matches SYMPTOMS) AND [2] onset within past 72 hours  Answer Assessment - Initial Assessment Questions 1. APPEARANCE of RASH: "Describe the rash."      Swollen outer side nose 2. LOCATION: "Where is the rash located?"      Inner nose 3. ONSET: "When did the rash start?"     yesterday 4. ITCHING: "Does the rash itch?" If so, ask: "How bad is the itch?"  (Scale 1-10; or mild, moderate, severe)    None 5. PAIN: "Does the rash hurt?" If so, ask: "How bad is the pain?"  (Scale 1-10; or mild, moderate, severe)    Left nostril red swollen tender to touch tip of nose 5 6. OTHER SYMPTOMS: "Do you have any other symptoms?" (e.g., fever)     Nasal drainage 7. PREGNANCY: "Is there any chance you are pregnant?" "When was your last menstrual period?"     N/A  Protocols used: [facility]

## 2018-12-01 ENCOUNTER — Encounter: Payer: Self-pay | Admitting: Family Medicine

## 2018-12-01 ENCOUNTER — Other Ambulatory Visit: Payer: Self-pay

## 2018-12-22 ENCOUNTER — Other Ambulatory Visit: Payer: Self-pay | Admitting: Family Medicine

## 2018-12-22 DIAGNOSIS — E785 Hyperlipidemia, unspecified: Secondary | ICD-10-CM

## 2018-12-22 DIAGNOSIS — IMO0002 Reserved for concepts with insufficient information to code with codable children: Secondary | ICD-10-CM

## 2018-12-22 DIAGNOSIS — I10 Essential (primary) hypertension: Secondary | ICD-10-CM

## 2018-12-22 DIAGNOSIS — E1151 Type 2 diabetes mellitus with diabetic peripheral angiopathy without gangrene: Secondary | ICD-10-CM

## 2018-12-23 NOTE — Telephone Encounter (Signed)
Requesting: Xanax Contract: 04/05/2017 UDS: 08/28/2016, low risk, 02/28/2017 Last OV: 12/17/2017 Next OV: N/A Last Refill: 10/07/2018, #90--0 RF Database:   Please advise

## 2019-01-06 ENCOUNTER — Other Ambulatory Visit: Payer: Self-pay | Admitting: Family Medicine

## 2019-01-06 DIAGNOSIS — IMO0002 Reserved for concepts with insufficient information to code with codable children: Secondary | ICD-10-CM

## 2019-01-06 DIAGNOSIS — I10 Essential (primary) hypertension: Secondary | ICD-10-CM

## 2019-01-06 DIAGNOSIS — E785 Hyperlipidemia, unspecified: Secondary | ICD-10-CM

## 2019-01-06 DIAGNOSIS — E1151 Type 2 diabetes mellitus with diabetic peripheral angiopathy without gangrene: Secondary | ICD-10-CM

## 2019-01-26 ENCOUNTER — Other Ambulatory Visit: Payer: Self-pay | Admitting: Family Medicine

## 2019-01-26 DIAGNOSIS — K589 Irritable bowel syndrome without diarrhea: Secondary | ICD-10-CM

## 2019-01-26 DIAGNOSIS — I10 Essential (primary) hypertension: Secondary | ICD-10-CM

## 2019-01-27 ENCOUNTER — Other Ambulatory Visit: Payer: Self-pay

## 2019-01-27 ENCOUNTER — Ambulatory Visit (INDEPENDENT_AMBULATORY_CARE_PROVIDER_SITE_OTHER): Payer: Self-pay | Admitting: Family Medicine

## 2019-01-27 ENCOUNTER — Encounter: Payer: Self-pay | Admitting: Family Medicine

## 2019-01-27 DIAGNOSIS — R197 Diarrhea, unspecified: Secondary | ICD-10-CM

## 2019-01-27 DIAGNOSIS — E1169 Type 2 diabetes mellitus with other specified complication: Secondary | ICD-10-CM

## 2019-01-27 DIAGNOSIS — R110 Nausea: Secondary | ICD-10-CM

## 2019-01-27 DIAGNOSIS — E785 Hyperlipidemia, unspecified: Secondary | ICD-10-CM

## 2019-01-27 DIAGNOSIS — I10 Essential (primary) hypertension: Secondary | ICD-10-CM

## 2019-01-27 DIAGNOSIS — E1165 Type 2 diabetes mellitus with hyperglycemia: Secondary | ICD-10-CM

## 2019-01-27 MED ORDER — ONDANSETRON HCL 4 MG PO TABS: ORAL_TABLET | ORAL | 0 refills | Status: DC

## 2019-01-27 NOTE — Progress Notes (Signed)
Virtual Visit via Video Note  I connected with Beth Shepard on 01/27/19 at  8:00 AM EDT by a video enabled telemedicine application and verified that I am speaking with the correct person using two identifiers.  Location: Patient: home Provider: office    I discussed the limitations of evaluation and management by telemedicine and the availability of in person appointments. The patient expressed understanding and agreed to proceed.  History of Present Illness: Pt is home c/o stomach issues.  + nausea ,vomiting that comes and goes     Pt with hx ibs and reflux   No temp She also gets diarrhea and then dizziness goes along with this.  She said her daughter and mother had/have the same thing.     Observations/Objective: 129/78  98.7  219  Pt is in NAD  Assessment and Plan: 1. Nausea without vomiting con't protonix F/u with GI -----she was supposed to get stools samples to them but has had trouble getting them I encouraged her to call and let them know her symptoms are worsening and she now has NV with the diarrhea  - ondansetron (ZOFRAN) 4 MG tablet; TAKE 1-2 TABLETS BY MOUTH EVERY 8 HOURS AS NEEDED FOR NAUSEA  Dispense: 40 tablet; Refill: 0  2. Uncontrolled type 2 diabetes mellitus with hyperglycemia ([place]) . Pt is not checking glucose- Check labs  - Lipid panel; Future - Comprehensive metabolic panel; Future - Hemoglobin A1c; Future  3. Hyperlipidemia associated with type 2 diabetes mellitus ([place]) Encouraged heart healthy diet, increase exercise, avoid trans fats, consider a krill oil cap daily - Lipid panel; Future  4. Essential hypertension Well controlled, no changes to meds. Encouraged heart healthy diet such as the DASH diet and exercise as tolerated.   5. Diarrhea, unspecified type Ongoing and worsening since March F/u with GI    Follow Up Instructions:    I discussed the assessment and treatment plan with the patient. The patient was provided an opportunity to ask  questions and all were answered. The patient agreed with the plan and demonstrated an understanding of the instructions.   The patient was advised to call back or seek an in-person evaluation if the symptoms worsen or if the condition fails to improve as anticipated.  I provided 15 minutes of non-face-to-face time during this encounter.   Ann Held, DO

## 2019-01-28 ENCOUNTER — Telehealth: Payer: Self-pay | Admitting: Gastroenterology

## 2019-01-28 DIAGNOSIS — R1310 Dysphagia, unspecified: Secondary | ICD-10-CM

## 2019-01-28 DIAGNOSIS — R1084 Generalized abdominal pain: Secondary | ICD-10-CM

## 2019-01-28 DIAGNOSIS — R197 Diarrhea, unspecified: Secondary | ICD-10-CM

## 2019-01-28 NOTE — Telephone Encounter (Signed)
I agree with your recommendation. Thank you.

## 2019-01-28 NOTE — Telephone Encounter (Signed)
Spoke with patient at great length she is having diarrhea and urgency and has had several episodes of soiling herself. She also has had some nausea and abd cramping but is more concerned about diarrhea. She states that she works from home and it has been very hard to come in to drop of her stool sample (since March) due to scheduling conflicts and the fact that she has had several incidents where she has soiled herself in public. I mentioned to her that we also need to schedule her for an EGD/colon and she states with her job right now she doesn't think that she can schedule at this time. She wanted to know if you have any new recommendations. I informed her that the lab is open at 7:30 am and maybe she could bring her stool sample as soon as they open to not miss any work, she agrees and she will try to do this, if not I spoke with the lab and she can refrigerate the sample up to 24 hours before bringing it in. She verbalized understanding.

## 2019-01-29 MED ORDER — NA SULFATE-K SULFATE-MG SULF 17.5-3.13-1.6 GM/177ML PO SOLN: 1.0000 | ORAL | 0 refills | Status: DC

## 2019-01-29 NOTE — Telephone Encounter (Signed)
Spoke with patient she agrees to schedule EGD/colon in [place] on 02/18/2019. She may have to call back if she cannot find a care partner for that day. Will mail instructions and consent form to patient. She verbalized understanding.

## 2019-02-11 ENCOUNTER — Other Ambulatory Visit: Payer: Self-pay

## 2019-02-16 ENCOUNTER — Telehealth: Payer: Self-pay | Admitting: Gastroenterology

## 2019-02-16 NOTE — Telephone Encounter (Signed)
Patient was very concerned that you know her cardiac history she just had an ECHO. I informed her that it will definitely be reviewed by Dr Tarri Glenn prior to her procedure. She had many concerns about the diet prior to her colonoscopy which were all addressed and she verbalized understanding.

## 2019-02-16 NOTE — Telephone Encounter (Signed)
I called and spoke with the patient by phone to be sure that Beth Shepard questions were answered.   I reviewed Beth Shepard echocardigram from 01/21/2019 in [place]. It showed: Mild global LV hypokinesis. Abnormal (paradoxical) motion consistent with left bundle branch block. Ejection fraction is visually estimated at 45-50%. Trace tricuspid regurgitation. There has no change compared to prior echo and this would not change my recommendation for endoscopy at this time.   All of Beth Shepard other questions regarding anesthesia and Beth Shepard teeth were answered to Beth Shepard satisfaction.

## 2019-02-16 NOTE — Telephone Encounter (Signed)
Pt is scheduled for EGD/col 02/18/19.  Pt reported that she has a heart issue and would like to discuss.  Pt stated that she has called this morning, but there is no documentation.

## 2019-02-16 NOTE — Telephone Encounter (Signed)
Thank you for your help.

## 2019-02-17 ENCOUNTER — Telehealth: Payer: Self-pay | Admitting: Gastroenterology

## 2019-02-17 NOTE — Telephone Encounter (Signed)
Spoke with patient regarding Covid-19 screening questions °Covid-19 Screening Questions: ° °Do you now or have you had a fever in the last 14 days? No    °Do you have any respiratory symptoms of shortness of breath or cough now or in the last 14 days? no ° °Do you have any family members or close contacts with diagnosed or suspected Covid-19 in the past 14 days? no ° °Have you been tested for Covid-19 and found to be positive? no ° °Pt made aware of that care partner may wait in the car or come up to the lobby during the procedure but will need to provide their own mask. °

## 2019-02-18 ENCOUNTER — Other Ambulatory Visit: Payer: Self-pay

## 2019-02-18 ENCOUNTER — Ambulatory Visit (AMBULATORY_SURGERY_CENTER): Payer: 59 | Admitting: Gastroenterology

## 2019-02-18 ENCOUNTER — Encounter: Payer: Self-pay | Admitting: Gastroenterology

## 2019-02-18 VITALS — BP 114/57 | HR 80 | Temp 98.7°F | Resp 20 | Ht 61.0 in | Wt 204.0 lb

## 2019-02-18 DIAGNOSIS — K219 Gastro-esophageal reflux disease without esophagitis: Secondary | ICD-10-CM

## 2019-02-18 DIAGNOSIS — K648 Other hemorrhoids: Secondary | ICD-10-CM | POA: Diagnosis not present

## 2019-02-18 DIAGNOSIS — D123 Benign neoplasm of transverse colon: Secondary | ICD-10-CM | POA: Diagnosis not present

## 2019-02-18 DIAGNOSIS — D124 Benign neoplasm of descending colon: Secondary | ICD-10-CM | POA: Diagnosis not present

## 2019-02-18 DIAGNOSIS — D120 Benign neoplasm of cecum: Secondary | ICD-10-CM | POA: Diagnosis not present

## 2019-02-18 DIAGNOSIS — D122 Benign neoplasm of ascending colon: Secondary | ICD-10-CM

## 2019-02-18 DIAGNOSIS — R197 Diarrhea, unspecified: Secondary | ICD-10-CM | POA: Diagnosis present

## 2019-02-18 DIAGNOSIS — D125 Benign neoplasm of sigmoid colon: Secondary | ICD-10-CM

## 2019-02-18 DIAGNOSIS — K5730 Diverticulosis of large intestine without perforation or abscess without bleeding: Secondary | ICD-10-CM

## 2019-02-18 DIAGNOSIS — R1310 Dysphagia, unspecified: Secondary | ICD-10-CM | POA: Diagnosis not present

## 2019-02-18 MED ORDER — SODIUM CHLORIDE 0.9 % IV SOLN: 500.0000 mL | Freq: Once | INTRAVENOUS | Status: DC

## 2019-02-18 NOTE — Progress Notes (Signed)
Report to PACU, RN, vss, BBS= Clear.  

## 2019-02-18 NOTE — Progress Notes (Signed)
Called to room to assist during endoscopic procedure.  Patient ID and intended procedure confirmed with present staff. Received instructions for my participation in the procedure from the performing physician.  

## 2019-02-18 NOTE — Progress Notes (Signed)
VS by CW. Covid screening, temp by June

## 2019-02-18 NOTE — Op Note (Signed)
[place] Patient Name: Beth Shepard Procedure Date: 02/18/2019 3:38 PM MRN: [id number] Endoscopist: Thornton Park MD, MD Age: 63 Referring MD:  Date of Birth: Feb 25, 1956 Gender: Female Account #: [id number] Procedure:                Colonoscopy Indications:              Abdominal pain with associated diarrhea                           - Treated with ampicillin for possible SIBO 2014                           - Trial of questran twice a day for diarrhea                           - previously treated with Hyomax BID                           Colonoscopy 05/2005 by Dr. Sharlett Iles was normal                            (had a very long, redundant colon)                           No known family history of colon cancer or polyps.                           No family history of uterine/endometrial cancer,                            pancreatic cancer or gastric/stomach cancer. Medicines:                See the Anesthesia note for documentation of the                            administered medications Procedure:                Pre-Anesthesia Assessment:                           - Prior to the procedure, a History and Physical                            was performed, and patient medications and                            allergies were reviewed. The patient's tolerance of                            previous anesthesia was also reviewed. The risks                            and benefits of the procedure and the sedation  options and risks were discussed with the patient.                            All questions were answered, and informed consent                            was obtained. Prior Anticoagulants: The patient has                            taken no previous anticoagulant or antiplatelet                            agents. ASA Grade Assessment: III - A patient with                            severe systemic disease. After reviewing the risks                             and benefits, the patient was deemed in                            satisfactory condition to undergo the procedure.                           After obtaining informed consent, the colonoscope                            was passed under direct vision. Throughout the                            procedure, the patient's blood pressure, pulse, and                            oxygen saturations were monitored continuously. The                            Colonoscope was introduced through the anus and                            advanced to the the terminal ileum, with                            identification of the appendiceal orifice and IC                            valve. A second forward view of the right colon was                            performed. The colonoscopy was technically                            difficult and complex due to restricted mobility of  the colon. Successful completion of the procedure                            was aided by applying abdominal pressure. The                            patient tolerated the procedure well. The quality                            of the bowel preparation was good. The ileocecal                            valve, appendiceal orifice, and rectum were                            photographed. Scope In: 3:51:48 PM Scope Out: 4:24:51 PM Scope Withdrawal Time: 0 hours 25 minutes 8 seconds  Total Procedure Duration: 0 hours 33 minutes 3 seconds  Findings:                 The perianal and digital rectal examinations were                            normal.                           Non-bleeding internal hemorrhoids were found. The                            hemorrhoids were small.                           A few small-mouthed diverticula were found in the                            sigmoid colon.                           Twelve sessile polyps were found in the sigmoid                             colon (2), descending colon (8) and hepatic flexure                            (2). The polyps were 1 to 4 mm in size. These                            polyps were removed with a cold snare. Resection                            and retrieval were complete. Estimated blood loss                            was minimal.  Three sessile polyps were found in the ascending                            colon (2) and cecum (1). The polyps were less than                            1 mm in size. These polyps were removed with a cold                            biopsy forceps. Resection and retrieval were                            complete. Estimated blood loss was minimal.                           The remainder of the examined colon appeared                            normal. Biopsies for histology were taken with a                            cold forceps from the ascending colon, descending                            colon and sigmoid colon for evaluation of                            microscopic colitis. Estimated blood loss was                            minimal.                           The exam was otherwise without abnormality on                            direct and retroflexion views. Complications:            No immediate complications. Estimated blood loss:                            Minimal. Estimated Blood Loss:     Estimated blood loss was minimal. Impression:               - Non-bleeding internal hemorrhoids.                           - Diverticulosis in the sigmoid colon.                           - Twelve 1 to 4 mm polyps in the sigmoid colon, in                            the descending colon and at the hepatic flexure,  removed with a cold snare. Resected and retrieved.                           - Three less than 1 mm polyps in the ascending                            colon and in the cecum, removed with a cold biopsy                             forceps. Resected and retrieved.                           - The entire examined colon is normal. Biopsied.                           - The examination was otherwise normal on direct                            and retroflexion views. Recommendation:           - Patient has a contact number available for                            emergencies. The signs and symptoms of potential                            delayed complications were discussed with the                            patient. Return to normal activities tomorrow.                            Written discharge instructions were provided to the                            patient.                           - Resume previous diet. High fiber diet recommended.                           - Continue present medications.                           - Await pathology results.                           - Repeat colonoscopy date to be determined after                            pending pathology results are reviewed for                            surveillance.                           -  Consider genetic testing if 10 or more adenomas                            are polyps                           - Your children and any brothers or sisters should                            start colon cancer screening at age 51. Thornton Park MD, MD 02/18/2019 4:45:07 PM This report has been signed electronically.

## 2019-02-18 NOTE — Op Note (Signed)
[place] Patient Name: Beth Shepard Procedure Date: 02/18/2019 3:38 PM MRN: [id number] Endoscopist: Thornton Park MD, MD Age: 63 Referring MD:  Date of Birth: December 16, 1955 Gender: Female Account #: [id number] Procedure:                Upper GI endoscopy Indications:              Abdominal pain with associated diarrhea                           - Treated with ampicillin for possible SIBO 2014                           - Trial of questran twice a day for diarrhea                           - previously treated with Hyomax BID                           Dysphasia to solids Medicines:                See the Anesthesia note for documentation of the                            administered medications Procedure:                Pre-Anesthesia Assessment:                           - Prior to the procedure, a History and Physical                            was performed, and patient medications and                            allergies were reviewed. The patient's tolerance of                            previous anesthesia was also reviewed. The risks                            and benefits of the procedure and the sedation                            options and risks were discussed with the patient.                            All questions were answered, and informed consent                            was obtained. Prior Anticoagulants: The patient has                            taken no previous anticoagulant or antiplatelet  agents. ASA Grade Assessment: III - A patient with                            severe systemic disease. After reviewing the risks                            and benefits, the patient was deemed in                            satisfactory condition to undergo the procedure.                           After obtaining informed consent, the endoscope was                            passed under direct vision. Throughout the              procedure, the patient's blood pressure, pulse, and                            oxygen saturations were monitored continuously. The                            Endoscope was introduced through the mouth, and                            advanced to the third part of duodenum. The upper                            GI endoscopy was accomplished without difficulty.                            The patient tolerated the procedure well. Scope In: Scope Out: Findings:                 The Z-line was irregular. Biopsies were taken with                            a cold forceps for histology from the proximal/mid                            and distal esophagus. Estimated blood loss was                            minimal. No ring, web, stricture, or luminal                            abnormality.                           Diffuse mildly erythematous mucosa was found in the                            gastric body. Biopsies were taken with a cold  forceps for histology. Estimated blood loss was                            minimal.                           Patchy severely erythematous mucosa without active                            bleeding and with stigmata of bleeding was found in                            the duodenal bulb and in the first portion of the                            duodenum. Biopsies were taken with a cold forceps                            for histology. Estimated blood loss was minimal.                           The cardia and gastric fundus were normal on                            retroflexion.                           The exam was otherwise without abnormality. Complications:            No immediate complications. Estimated blood loss:                            Minimal. Estimated Blood Loss:     Estimated blood loss was minimal. Impression:               - Z-line irregular. Biopsied.                           - Erythematous mucosa in the  gastric body. Biopsied.                           - Erythematous duodenopathy. Biopsied.                           - The examination was otherwise normal. Recommendation:           - Patient has a contact number available for                            emergencies. The signs and symptoms of potential                            delayed complications were discussed with the                            patient. Return to normal activities tomorrow.  Written discharge instructions were provided to the                            patient.                           - Resume previous diet today.                           - Continue present medications. Increase                            pantoprazole (Protonix) to 40 mg twice daily for 8                            weeks.                           - Await pathology results. Thornton Park MD, MD 02/18/2019 4:35:55 PM This report has been signed electronically.

## 2019-02-18 NOTE — Patient Instructions (Signed)
Please read handouts provided. Continue present medications. Await pathology results. Increase pantoprazole (Protonix) to 40 mg twice daily for 8 weeks.        YOU HAD AN ENDOSCOPIC PROCEDURE TODAY AT [facility]:   Refer to the procedure report that was given to you for any specific questions about what was found during the examination.  If the procedure report does not answer your questions, please call your gastroenterologist to clarify.  If you requested that your care partner not be given the details of your procedure findings, then the procedure report has been included in a sealed envelope for you to review at your convenience later.  YOU SHOULD EXPECT: Some feelings of bloating in the abdomen. Passage of more gas than usual.  Walking can help get rid of the air that was put into your GI tract during the procedure and reduce the bloating. If you had a lower endoscopy (such as a colonoscopy or flexible sigmoidoscopy) you may notice spotting of blood in your stool or on the toilet paper. If you underwent a bowel prep for your procedure, you may not have a normal bowel movement for a few days.  Please Note:  You might notice some irritation and congestion in your nose or some drainage.  This is from the oxygen used during your procedure.  There is no need for concern and it should clear up in a day or so.  SYMPTOMS TO REPORT IMMEDIATELY:   Following lower endoscopy (colonoscopy or flexible sigmoidoscopy):  Excessive amounts of blood in the stool  Significant tenderness or worsening of abdominal pains  Swelling of the abdomen that is new, acute  Fever of 100F or higher   Following upper endoscopy (EGD)  Vomiting of blood or coffee ground material  New chest pain or pain under the shoulder blades  Painful or persistently difficult swallowing  New shortness of breath  Fever of 100F or higher  Black, tarry-looking stools  For urgent or emergent issues, a  gastroenterologist can be reached at any hour by calling [phone].   DIET:  We do recommend a small meal at first, but then you may proceed to your regular diet.  Drink plenty of fluids but you should avoid alcoholic beverages for 24 hours.  ACTIVITY:  You should plan to take it easy for the rest of today and you should NOT DRIVE or use heavy machinery until tomorrow (because of the sedation medicines used during the test).    FOLLOW UP: Our staff will call the number listed on your records 48-72 hours following your procedure to check on you and address any questions or concerns that you may have regarding the information given to you following your procedure. If we do not reach you, we will leave a message.  We will attempt to reach you two times.  During this call, we will ask if you have developed any symptoms of COVID 19. If you develop any symptoms (ie: fever, flu-like symptoms, shortness of breath, cough etc.) before then, please call [phone].  If you test positive for Covid 19 in the 2 weeks post procedure, please call and report this information to [place].    If any biopsies were taken you will be contacted by phone or by letter within the next 1-3 weeks.  Please call us at [phone] if you have not heard about the biopsies in 3 weeks.    SIGNATURES/CONFIDENTIALITY: You and/or your care partner have signed paperwork which will be  entered into your electronic medical record.  These signatures attest to the fact that that the information above on your After Visit Summary has been reviewed and is understood.  Full responsibility of the confidentiality of this discharge information lies with you and/or your care-partner. 

## 2019-02-20 ENCOUNTER — Telehealth: Payer: Self-pay

## 2019-02-20 NOTE — Telephone Encounter (Signed)
  Follow up Call-  Call back number 02/18/2019  Post procedure Call Back phone  # [phone]  Permission to leave phone message No  Some recent data might be hidden     Patient questions:  Do you have a fever, pain , or abdominal swelling? Yes.   Pain Score  pt c/o gas pain.  She reported the gas pain was stronger than her abd pain she was having pre-procedure *  Have you tolerated food without any problems? No.  Have you been able to return to your normal activities? Yes.    Do you have any questions about your discharge instructions: Diet   No. Medications  No. Follow up visit  No.  Do you have questions or concerns about your Care? No.  Actions: * If pain score is 4 or above: No action needed, pain <4. I advised pt to try Gas-X or simethicone to see if it helps with gas sx.  If sx do not subside or worsen to call us back. Maw  1. Have you developed a fever since your procedure? no  2.   Have you had an respiratory symptoms (SOB or cough) since your procedure? no  3.   Have you tested positive for COVID 19 since your procedure no  4.   Have you had any family members/close contacts diagnosed with the COVID 19 since your procedure?  no   If yes to any of these questions please route to Joylene John, RN and Alphonsa Gin, Therapist, sports.

## 2019-02-20 NOTE — Telephone Encounter (Signed)
Calling back because pain is worse and does not want to go thru the weekend like this. It feels like cramps.Would like to speak to nurse today please.

## 2019-02-20 NOTE — Telephone Encounter (Signed)
Pt states she feels like "everytime I stand up, I feel like I am having a baby." She states she hasn't eaten much since her procedure.  She states she has a Radio producer taste."  She did have a BM and that did help relieve the discomfort. Lower abdominal pain, rating as a "5-6."  She states she tried gas-x but had no relief.    Dr. Tarri Glenn,  Please advise.  Thanks,  [employer]

## 2019-02-20 NOTE — Telephone Encounter (Signed)
I spoke with the patient by phone. She has been tolerating liquids and some solids including soup this morning. First BM after the procedure was this afternoon. +flatus  But, she has multiple symptoms that are different than those prior to her procedure. Most pressing are severe abdominal cramps that seem to be getting worse since she came home 02/18/19. No nausea, vomiting, fevers, or chills.  I suggested that she try an enema or suppository tonight. If symptoms persist or worsen, she was instructed to go to the ED. She agreed to go to [facility] ED this evening if she isn't feeling better.   Please call the patient on Monday to see how she is feeling.

## 2019-02-23 ENCOUNTER — Encounter: Payer: Self-pay | Admitting: Medical

## 2019-02-23 ENCOUNTER — Telehealth: Payer: Self-pay | Admitting: Gastroenterology

## 2019-02-23 ENCOUNTER — Other Ambulatory Visit: Payer: Self-pay

## 2019-02-23 ENCOUNTER — Ambulatory Visit (INDEPENDENT_AMBULATORY_CARE_PROVIDER_SITE_OTHER): Payer: 59 | Admitting: Medical

## 2019-02-23 VITALS — BP 99/78 | Temp 99.5°F | Ht 61.0 in | Wt 206.0 lb

## 2019-02-23 DIAGNOSIS — R6883 Chills (without fever): Secondary | ICD-10-CM | POA: Diagnosis not present

## 2019-02-23 DIAGNOSIS — N390 Urinary tract infection, site not specified: Secondary | ICD-10-CM | POA: Diagnosis not present

## 2019-02-23 DIAGNOSIS — R350 Frequency of micturition: Secondary | ICD-10-CM

## 2019-02-23 DIAGNOSIS — R109 Unspecified abdominal pain: Secondary | ICD-10-CM

## 2019-02-23 MED ORDER — CIPROFLOXACIN HCL 500 MG PO TABS: 500.0000 mg | ORAL_TABLET | Freq: Two times a day (BID) | ORAL | 0 refills | Status: DC

## 2019-02-23 NOTE — Telephone Encounter (Signed)
Please see other telephone note

## 2019-02-23 NOTE — Progress Notes (Signed)
Subjective:    Patient ID: Beth Shepard, female    DOB: [birth year], 63 y.o.   MRN: [id number]  HPI    Virtual Visit via Telephone Note  I connected with Beth Shepard on 02/23/19 at  2:00 PM EDT by telephone and verified that I am speaking with the correct person using two identifiers.  Location: Patient: home  Provider: office   I discussed the limitations, risks, security and privacy concerns of performing an evaluation and management service by telephone and the availability of in person appointments. I also discussed with the patient that there may be a patient responsible charge related to this service. The patient expressed understanding and agreed to proceed.   History of Present Illness: Pt in today reporting urinary symptoms since Saturday.  Dysuria- yes Frequent urination-yes Hesitancy-yes.  Suprapubic pressure-yes Fever-low grade temp 99.5 chills- yesterday. Nausea-yes. Before recent gi studies. Vomiting- not recently.  CVA pain- some since Saturday. Left side. History of UTI- has had some in the past but none recently. No chronic Gross hematuria-Orange after taking azo.  Pt thinks some odor to urine.  Pt had endoscopy and colonscopy earlier in the week 02-18-2019. Pt states she informed GI MD about lower abdomen discomfort. The advised her to got to ED since she was post op from the procedure. She did not follow advise initially about going to the ED.  Pt has some bright red blood from and black stools after studies. Pt states stools look more orange now. She states last time saw black in colored stools Thursday or Friday. Last time saw bright red blood as well.   Pt refuses to come in for labs before Friday. She has a one 45 minutes  commute   Observations/Objective:  General- no acute distress, pleasant, oriented normal speech.  Assessment and Plan: Symptoms of probable UTI that occurred post post gastrointestinal procedures.  Urinary tract symptoms  persist but at 1 point early on she did have lower abdomen discomfort and had some bright red blood per rectum as well as some dark black stools.  Patient did have 14 polyps removed per her report.  Early on when she notified GI of her symptoms post procedure they advised to be seen in the emergency department.  Patient did not go to the emergency department and is now reporting mostly urinary tract type symptoms.  She reports resolution of the bright red blood as well as the black stool.  However due to the above history, I thought it would be a good idea for patient to come in the office tomorrow or on Wednesday to give sample of urine, get culture as well as to get blood work.  I wanted to include stat CBC and metabolic panel.  I wanted to review the CBC in order to evaluate blood volume in light of her recent GI surgery.  Patient expressed to me that her being seen in the office was near to impossible as she had a commute of 45 minutes each way.  She expressed that this just simply would not fit into her work schedule.  She is already scheduled to come in on Friday and states that the only day she can come in.  So I decided to go ahead and send in Cipro antibiotic but advised patient that if she has worsening or changing signs symptoms then be seen in the emergency department.  Patient expressed understanding with this plan.  Counseled patient on reasoning in light of her and impossible describe work  schedule.  Follow-up Friday or as needed.  Follow Up Instructions:    I discussed the assessment and treatment plan with the patient. The patient was provided an opportunity to ask questions and all were answered. The patient agreed with the plan and demonstrated an understanding of the instructions.   The patient was advised to call back or seek an in-person evaluation if the symptoms worsen or if the condition fails to improve as anticipated.  I provided 25+minutes of non-face-to-face time during this  encounter.   Mackie Pai, PA-C      Review of Systems  Constitutional: Positive for chills and fever. Negative for fatigue.  HENT: Negative for congestion.   Respiratory: Negative for cough, chest tightness, shortness of breath and wheezing.   Cardiovascular: Negative for chest pain and palpitations.  Gastrointestinal: Positive for abdominal pain. Negative for abdominal distention, constipation, diarrhea, nausea, rectal pain and vomiting.       No blood recently but some after procedure.  Genitourinary: Positive for difficulty urinating, dysuria, frequency and urgency. Negative for vaginal pain.       Hesitant urine flow.  Skin: Negative for rash.  Hematological: Negative for adenopathy. Does not bruise/bleed easily.    Past Medical History:  Diagnosis Date  . Anxiety   . Bilateral edema of lower extremity    Swelling in legs and ankles  . Congestive heart failure ([place])   . COPD (chronic obstructive pulmonary disease) ([place])   . Diabetes mellitus    type 2  . Gastric polyps   . GERD (gastroesophageal reflux disease)    chronic  . Hyperlipidemia   . Hypertension   . IBS (irritable bowel syndrome)   . LBBB (left bundle branch block)    Dr. Gwenith Spitz, [facility]  . Pneumonia    hx of  . PONV (postoperative nausea and vomiting)    Had N/V after having tubal ligation and D&C  . Rheumatoid arthritis ([place])   . Shingles      Social History   Socioeconomic History  . Marital status: Widowed    Spouse name: Not on file  . Number of children: 2  . Years of education: Not on file  . Highest education level: Not on file  Occupational History  . Occupation: Administive     Employer: [employer]  Social Needs  . Financial resource strain: Not on file  . Food insecurity    Worry: Not on file    Inability: Not on file  . Transportation needs    Medical: Not on file    Non-medical: Not on file  Tobacco Use  . Smoking status: Former Smoker     Years: 33.00    Types: Cigarettes    Quit date: 06/25/2002    Years since quitting: 16.6  . Smokeless tobacco: Never Used  Substance and Sexual Activity  . Alcohol use: Yes    Comment: rare  . Drug use: No  . Sexual activity: Not Currently    Partners: Male  Lifestyle  . Physical activity    Days per week: Not on file    Minutes per session: Not on file  . Stress: Not on file  Relationships  . Social Herbalist on phone: Not on file    Gets together: Not on file    Attends religious service: Not on file    Active member of club or organization: Not on file    Attends meetings of clubs or organizations: Not  on file    Relationship status: Not on file  . Intimate partner violence    Fear of current or ex partner: Not on file    Emotionally abused: Not on file    Physically abused: Not on file    Forced sexual activity: Not on file  Other Topics Concern  . Not on file  Social History Narrative   Limited exercise per cardiology    Past Surgical History:  Procedure Laterality Date  . CARDIAC CATHETERIZATION     No PCI  . CHOLECYSTECTOMY    . COLONOSCOPY  06/06/2005   normal   . DILATION AND CURETTAGE OF UTERUS    . ESOPHAGOGASTRODUODENOSCOPY  06/06/2005, 11/17/2013  . JOINT REPLACEMENT Right 10/24/2015   rt hip replacement  . KNEE ARTHROSCOPY Left   . TOTAL HIP ARTHROPLASTY Right 10/24/2015   Procedure: RIGHT TOTAL HIP ARTHROPLASTY ANTERIOR APPROACH;  Surgeon: Leandrew Koyanagi, MD;  Location: [place];  Service: Orthopedics;  Laterality: Right;  . TUBAL LIGATION    . VAGINAL HYSTERECTOMY      Family History  Problem Relation Age of Onset  . Lymphoma Mother   . Heart disease Mother        chf, cabg x6  . Hypertension Mother   . GER disease Mother   . Hiatal hernia Mother   . Gallstones Mother   . Cancer Sister        brain stage 4  . Heart disease Maternal Grandmother 3       MI  . Heart disease Maternal Grandfather   . Stroke Paternal Grandmother   .  Hypertension Paternal Grandmother   . Stroke Paternal Grandfather   . Hypertension Paternal Grandfather   . Rheum arthritis Father   . Hypertension Daughter   . Colon cancer Neg Hx   . Esophageal cancer Neg Hx   . Pancreatic cancer Neg Hx   . Liver disease Neg Hx   . Kidney disease Neg Hx     Allergies  Allergen Reactions  . Prednisone Palpitations and Other (See Comments)    Dose pack causes vomiting; Causes Gerd N/v/d N/v/d   . Codeine Nausea Only and Palpitations    REACTION: palpitation  REACTION: palpitation    . Ibuprofen Other (See Comments) and Rash    REACTION: questionable. Pt states that she can take this sometimes. GERD    Current Outpatient Medications on File Prior to Visit  Medication Sig Dispense Refill  . Alogliptin Benzoate 25 MG TABS TAKE 1 TABLET BY MOUTH EVERY DAY 90 tablet 0  . ALPRAZolam (XANAX) 0.25 MG tablet TAKE 1 TABLET BY MOUTH THREE TIMES A DAY AS NEEDED 90 tablet 0  . aspirin 81 MG tablet Take 81 mg by mouth daily.    . Aspirin-Calcium Carbonate 81-777 MG TABS Take by mouth.    Marland Kitchen atorvastatin (LIPITOR) 20 MG tablet Take 1 tablet (20 mg total) by mouth daily. 90 tablet 1  . Calcium Carb-Cholecalciferol (CALCIUM-VITAMIN D) 500-200 MG-UNIT tablet Take by mouth.    . carvedilol (COREG) 12.5 MG tablet Take 1 tablet (12.5 mg total) by mouth 2 (two) times daily. 180 tablet 1  . fenofibrate 160 MG tablet Take 1 tablet (160 mg total) by mouth daily. 30 tablet 2  . furosemide (LASIX) 20 MG tablet TAKE 1 TABLET BY MOUTH EVERY DAY 90 tablet 1  . gabapentin (NEURONTIN) 100 MG capsule TAKE 1 CAPSULE BY MOUTH THREE TIMES A DAY 270 capsule 2  . glimepiride (AMARYL) 4  MG tablet Take 1 tablet (4 mg total) by mouth daily before breakfast. 90 tablet 1  . glucose blood (ONE TOUCH TEST STRIPS) test strip 1 each by Other route as needed. Reported on 11/02/2015    . HYDROcodone-acetaminophen (NORCO/VICODIN) 5-325 MG tablet Take 1 tablet by mouth at bedtime as needed for  moderate pain. 15 tablet 0  . hyoscyamine (LEVBID) 0.375 MG 12 hr tablet TAKE 1 TABLET BY MOUTH 2 TIMES DAILY. 60 tablet 2  . levocetirizine (XYZAL) 5 MG tablet TAKE 1 TABLET (5 MG TOTAL) BY MOUTH EVERY EVENING. 90 tablet 1  . lisinopril (ZESTRIL) 20 MG tablet TAKE 1 TABLET BY MOUTH EVERY DAY 90 tablet 1  . meloxicam (MOBIC) 7.5 MG tablet Take 1 tablet (7.5 mg total) by mouth daily as needed for pain. 90 tablet 1  . methocarbamol (ROBAXIN) 500 MG tablet Take by mouth.    . mupirocin ointment (BACTROBAN) 2 % Place 1 application into the nose 2 (two) times daily. 22 g 0  . omega-3 acid ethyl esters (LOVAZA) 1 g capsule Take 2 g by mouth.    . ondansetron (ZOFRAN) 4 MG tablet TAKE 1-2 TABLETS BY MOUTH EVERY 8 HOURS AS NEEDED FOR NAUSEA 40 tablet 0  . pantoprazole (PROTONIX) 40 MG tablet Take 1 tablet (40 mg total) by mouth daily. 90 tablet 3  . simvastatin (ZOCOR) 40 MG tablet Take by mouth.    . spironolactone (ALDACTONE) 25 MG tablet Take 1 tablet (25 mg total) by mouth every other day. Dr Ola Spurr    . tizanidine (ZANAFLEX) 2 MG capsule Take 1 capsule (2 mg total) by mouth 2 (two) times daily as needed for muscle spasms. 60 capsule 1  . traMADol (ULTRAM) 50 MG tablet      No current facility-administered medications on file prior to visit.     BP 99/78   Temp 99.5 F (37.5 C)   Ht 5\' 1"  (1.549 m)   Wt 206 lb (93.4 kg)   BMI 38.92 kg/m       Objective:   Physical Exam        Assessment & Plan:

## 2019-02-23 NOTE — Telephone Encounter (Signed)
Thank you. I agree with your recommendations.

## 2019-02-23 NOTE — Patient Instructions (Addendum)
Symptoms of probable UTI that occurred post gastrointestinal procedures.  Urinary tract symptoms persist but at 1 point early on she did have lower abdomen discomfort and had some bright red blood per rectum as well as some dark black stools.  Patient did have 14 polyps removed per her report.  Early on when she notified GI of her symptoms post procedure GI advised to be seen in the emergency department.  Patient did not go to the emergency department and is now reporting mostly urinary tract type symptoms.  She reports resolution of the bright red blood as well as the black stool.  However due to the above history, I thought it would be a good idea for patient to come in the office tomorrow or on Wednesday to give sample of urine, get culture as well as to get blood work.  I wanted to include stat CBC and metabolic panel.  I wanted to review the CBC in order to evaluate blood volume in light of her recent GI surgery.  Patient expressed to me that her being seen in the office was near to impossible as she had a commute of 45 minutes each way.  She expressed that this just simply would not fit into her work schedule.  She is already scheduled to come in on Friday and states that the only day she can come in.  So I decided to go ahead and send in Cipro antibiotic but advised patient that if she has worsening or changing signs symptoms then be seen in the emergency department.  Patient expressed understanding with this plan.  Counseled patient on reasoning in light of her and impossible describe work schedule.  Follow-up Friday or as needed.

## 2019-02-23 NOTE — Telephone Encounter (Signed)
Spoke to the patient who reports lower abd pain, dysuria and burning with urination. The patient wanted to know if "abdominal cramping" could be signs of a UTI. This RN told the patient that dysuria and burning with urination and the feeling of not completely emptying her bladder were signs and symptoms that pointed to a UTI over generalized abd cramping. Patient told the follow up with her PCP for further questions and possible tx concerning UTI. Patient verbalized understanding and did not voice any more concerns during the time of the call.

## 2019-02-24 ENCOUNTER — Other Ambulatory Visit: Payer: Self-pay | Admitting: Family Medicine

## 2019-02-24 ENCOUNTER — Telehealth: Payer: Self-pay | Admitting: Family Medicine

## 2019-02-24 DIAGNOSIS — E1151 Type 2 diabetes mellitus with diabetic peripheral angiopathy without gangrene: Secondary | ICD-10-CM

## 2019-02-24 DIAGNOSIS — IMO0002 Reserved for concepts with insufficient information to code with codable children: Secondary | ICD-10-CM

## 2019-02-24 NOTE — Telephone Encounter (Signed)
Yes it will--- better to check ua off azo

## 2019-02-24 NOTE — Telephone Encounter (Signed)
Please advise 

## 2019-02-24 NOTE — Telephone Encounter (Signed)
Patient inquired if she is to continue taking AZO, even though she has upcoming urinalysis, she inquired if the change in color of urine would interfere with test. Please advise?

## 2019-02-24 NOTE — Telephone Encounter (Signed)
Spoke w/ Pt- informed of recommendations. Pt verbalized understanding.  

## 2019-02-25 ENCOUNTER — Telehealth: Payer: Self-pay | Admitting: *Deleted

## 2019-02-25 NOTE — Telephone Encounter (Signed)
Thank you for scheduling the appointment. We will proceed with further evaluation for bacterial overgrowth as a cause for her diarrhea at that time.

## 2019-02-25 NOTE — Telephone Encounter (Signed)
FYI Spoke to the patient who was apprehensive about completing the breath test. The patient had several questions about the breath test, what it was, why she needed it, what illness it pointed too, "what's wrong with my lungs?" Appointment scheduled on 03/27/2019 at 10:30 am to discuss her many questions concerning the breath test.

## 2019-02-27 ENCOUNTER — Other Ambulatory Visit (INDEPENDENT_AMBULATORY_CARE_PROVIDER_SITE_OTHER): Payer: 59

## 2019-02-27 ENCOUNTER — Other Ambulatory Visit: Payer: Self-pay

## 2019-02-27 DIAGNOSIS — R109 Unspecified abdominal pain: Secondary | ICD-10-CM | POA: Diagnosis not present

## 2019-02-27 LAB — POC URINALSYSI DIPSTICK (AUTOMATED)
Bilirubin, UA: NEGATIVE
Glucose, UA: NEGATIVE
Ketones, UA: NEGATIVE
Leukocytes, UA: NEGATIVE
Nitrite, UA: NEGATIVE
Protein, UA: POSITIVE — AB
Spec Grav, UA: 1.02 (ref 1.010–1.025)
Urobilinogen, UA: 0.2 E.U./dL
pH, UA: 5 (ref 5.0–8.0)

## 2019-02-27 LAB — CBC WITH DIFFERENTIAL/PLATELET
Basophils Absolute: 0.1 10*3/uL (ref 0.0–0.1)
Basophils Relative: 1.6 % (ref 0.0–3.0)
Eosinophils Absolute: 0.2 10*3/uL (ref 0.0–0.7)
Eosinophils Relative: 2.6 % (ref 0.0–5.0)
HCT: 34.2 % — ABNORMAL LOW (ref 36.0–46.0)
Hemoglobin: 11 g/dL — ABNORMAL LOW (ref 12.0–15.0)
Lymphocytes Relative: 20.2 % (ref 12.0–46.0)
Lymphs Abs: 1.7 10*3/uL (ref 0.7–4.0)
MCHC: 32.3 g/dL (ref 30.0–36.0)
MCV: 85.7 fl (ref 78.0–100.0)
Monocytes Absolute: 0.6 10*3/uL (ref 0.1–1.0)
Monocytes Relative: 7.2 % (ref 3.0–12.0)
Neutro Abs: 5.8 10*3/uL (ref 1.4–7.7)
Neutrophils Relative %: 68.4 % (ref 43.0–77.0)
Platelets: 332 10*3/uL (ref 150.0–400.0)
RBC: 3.99 Mil/uL (ref 3.87–5.11)
RDW: 14 % (ref 11.5–15.5)
WBC: 8.4 10*3/uL (ref 4.0–10.5)

## 2019-02-28 ENCOUNTER — Telehealth: Payer: Self-pay | Admitting: Medical

## 2019-02-28 DIAGNOSIS — R319 Hematuria, unspecified: Secondary | ICD-10-CM

## 2019-02-28 NOTE — Telephone Encounter (Signed)
Placed referral to urologist.

## 2019-03-04 ENCOUNTER — Telehealth: Payer: Self-pay | Admitting: Medical

## 2019-03-04 DIAGNOSIS — R102 Pelvic and perineal pain: Secondary | ICD-10-CM

## 2019-03-04 NOTE — Telephone Encounter (Signed)
Future urine culture placed.

## 2019-03-16 ENCOUNTER — Other Ambulatory Visit: Payer: Self-pay | Admitting: Family Medicine

## 2019-03-16 DIAGNOSIS — E785 Hyperlipidemia, unspecified: Secondary | ICD-10-CM

## 2019-03-16 DIAGNOSIS — I10 Essential (primary) hypertension: Secondary | ICD-10-CM

## 2019-03-16 DIAGNOSIS — E1151 Type 2 diabetes mellitus with diabetic peripheral angiopathy without gangrene: Secondary | ICD-10-CM

## 2019-03-16 DIAGNOSIS — IMO0002 Reserved for concepts with insufficient information to code with codable children: Secondary | ICD-10-CM

## 2019-03-17 NOTE — Telephone Encounter (Signed)
Requesting: Xanax Contract: 04/05/2017 UDS: 03/03/2015 Last OV: 02/23/2019 w/ [place] Next OV: 04/27/2019 Last Refill: 12/23/2018, #90--0 RF Database:   Please advise

## 2019-03-26 ENCOUNTER — Telehealth: Payer: Self-pay | Admitting: Gastroenterology

## 2019-03-26 NOTE — Telephone Encounter (Signed)
Pt would like to know why she is doing a breath test when her symptoms are lower GI related.  She also inquired whether she can stop by the office to pick up the test.

## 2019-03-27 ENCOUNTER — Ambulatory Visit: Payer: 59 | Admitting: Gastroenterology

## 2019-03-27 NOTE — Telephone Encounter (Signed)
Spoke with pt and she is aware. States she will call the office back when she can get here to pick up the test kit.

## 2019-03-27 NOTE — Telephone Encounter (Signed)
The breath test is to evaluate for small intestinal bacterial overgrowth.  When this is present, bacterial in the small intestines can cause both diarrhea and abdominal pain, as well as many other gastrointestinal symptoms.  If she would prefer an office visit to discuss prior to proceeding to schedule please arrange at her convenience.  Thank you.

## 2019-03-27 NOTE — Telephone Encounter (Signed)
Please see note below and advise  

## 2019-04-13 ENCOUNTER — Ambulatory Visit: Payer: Self-pay | Admitting: *Deleted

## 2019-04-13 NOTE — Telephone Encounter (Signed)
She needs ov--- if sob worsening-- go to ER

## 2019-04-13 NOTE — Telephone Encounter (Signed)
FYI
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
## 2019-04-14 NOTE — Telephone Encounter (Signed)
Pt has appointment tomorrow 04/15/2019

## 2019-04-15 ENCOUNTER — Other Ambulatory Visit: Payer: Self-pay

## 2019-04-15 ENCOUNTER — Encounter: Payer: Self-pay | Admitting: Family Medicine

## 2019-04-15 ENCOUNTER — Ambulatory Visit (INDEPENDENT_AMBULATORY_CARE_PROVIDER_SITE_OTHER): Payer: 59 | Admitting: Family Medicine

## 2019-04-15 ENCOUNTER — Ambulatory Visit: Payer: 59 | Admitting: Gastroenterology

## 2019-04-15 DIAGNOSIS — J40 Bronchitis, not specified as acute or chronic: Secondary | ICD-10-CM

## 2019-04-15 DIAGNOSIS — T59811A Toxic effect of smoke, accidental (unintentional), initial encounter: Secondary | ICD-10-CM | POA: Diagnosis not present

## 2019-04-15 DIAGNOSIS — Z20822 Contact with and (suspected) exposure to covid-19: Secondary | ICD-10-CM

## 2019-04-15 MED ORDER — ALBUTEROL SULFATE HFA 108 (90 BASE) MCG/ACT IN AERS: 2.0000 | INHALATION_SPRAY | Freq: Four times a day (QID) | RESPIRATORY_TRACT | 3 refills | Status: DC | PRN
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
## 2019-04-17 LAB — NOVEL CORONAVIRUS, NAA: SARS-CoV-2, NAA: NOT DETECTED

## 2019-04-27 ENCOUNTER — Ambulatory Visit: Payer: Self-pay | Admitting: Family Medicine

## 2019-05-05 ENCOUNTER — Telehealth: Payer: Self-pay | Admitting: Gastroenterology

## 2019-05-05 NOTE — Telephone Encounter (Signed)
Spoke to the patient who will be in tomorrow to retrieve the SIBO breath test.

## 2019-05-06 NOTE — Telephone Encounter (Signed)
Faxed order req to [facility]. SIBO breath test at front desk for patient.

## 2019-05-07 ENCOUNTER — Other Ambulatory Visit: Payer: Self-pay | Admitting: Family Medicine

## 2019-05-07 DIAGNOSIS — N644 Mastodynia: Secondary | ICD-10-CM

## 2019-05-07 DIAGNOSIS — E785 Hyperlipidemia, unspecified: Secondary | ICD-10-CM

## 2019-05-07 DIAGNOSIS — IMO0002 Reserved for concepts with insufficient information to code with codable children: Secondary | ICD-10-CM

## 2019-05-07 DIAGNOSIS — I10 Essential (primary) hypertension: Secondary | ICD-10-CM

## 2019-05-07 DIAGNOSIS — E1151 Type 2 diabetes mellitus with diabetic peripheral angiopathy without gangrene: Secondary | ICD-10-CM

## 2019-05-12 ENCOUNTER — Telehealth: Payer: Self-pay
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
## 2019-05-12 NOTE — Telephone Encounter (Signed)
Ophthalmologist would have prescribed a drop--- not sure what the question is

## 2019-05-13 ENCOUNTER — Ambulatory Visit (INDEPENDENT_AMBULATORY_CARE_PROVIDER_SITE_OTHER)
Admission: RE | Admit: 2019-05-13 | Discharge: 2019-05-13 | Disposition: A | Discharge status: Home / Self Care | Payer: 59 | Source: Ambulatory Visit | Attending: Family Medicine | Admitting: Family Medicine

## 2019-05-13 ENCOUNTER — Other Ambulatory Visit: Payer: Self-pay

## 2019-05-13 DIAGNOSIS — T59811A Toxic effect of smoke, accidental (unintentional), initial encounter: Secondary | ICD-10-CM | POA: Diagnosis not present

## 2019-05-13 NOTE — Telephone Encounter (Signed)
Left detailed message on machine that the ophthalmologist should handle this.
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
## 2019-05-14 NOTE — Telephone Encounter (Signed)
Patient stated that she has not been to an eye doctor in a while and know that she needs to go.  They do not have any openings and told her to call her pcp and get some lubricant eye drops.  Advised patient that we can do an virtual visit since her care is not running right and get her referral to somewhere in [place] or [place].  She stated that she was unable to do an appointment today or tomorrow.  Advised that another provider in the office can see her as well if she needed earlier appointment.  Also expressed to her the importance of getting her eyes checked because of her diabetes and that she also needs lab work (we have not done any in a while).  She will call back to schedule for Monday and will be seen sooner if she gets worse.  Advised that if it gets worse to got Urgent care immediately and not schedule and wait for appointment as this will delay her care.  Also she stated [facility]ant eye drops today to try.

## 2019-06-02 ENCOUNTER — Other Ambulatory Visit: Payer: Self-pay | Admitting: Family Medicine

## 2019-06-02 DIAGNOSIS — E1165 Type 2 diabetes mellitus with hyperglycemia: Secondary | ICD-10-CM

## 2019-06-02 DIAGNOSIS — IMO0002 Reserved for concepts with insufficient information to code with codable children: Secondary | ICD-10-CM

## 2019-06-02 DIAGNOSIS — E785 Hyperlipidemia, unspecified: Secondary | ICD-10-CM

## 2019-06-02 DIAGNOSIS — I10 Essential (primary) hypertension: Secondary | ICD-10-CM

## 2019-06-02 DIAGNOSIS — E1151 Type 2 diabetes mellitus with diabetic peripheral angiopathy without gangrene: Secondary | ICD-10-CM

## 2019-06-03 NOTE — Telephone Encounter (Signed)
MyChart message sent to pt

## 2019-06-03 NOTE — Telephone Encounter (Signed)
Requesting: Alprazolam 0.25mg  Contract: 04/05/2017 UDS: 03/03/2015 Last Visit: 01/27/2019; 04/15/2019 (acute) Next Visit: None Last Refill: 03/17/2019, #90 w/0 RF  Please Advise

## 2019-06-03 NOTE — Telephone Encounter (Signed)
I will refill but we will need uds and contract

## 2019-06-04 ENCOUNTER — Telehealth: Payer: Self-pay | Admitting: Gastroenterology

## 2019-06-04 NOTE — Telephone Encounter (Signed)
May refill at [facility] but I would recommend that she try to reduce the dose to QAM to see if this still controls her symptoms. Please schedule follow-up with me or an APP at her convenience. Thanks.

## 2019-06-04 NOTE — Telephone Encounter (Signed)
Egd note states to increase Pantoprazole to twice a day for 8 weeks. Should patient continue twice a day? Please advise, she does not have a follow up scheduled.

## 2019-06-04 NOTE — Telephone Encounter (Signed)
Pt is requesting rf for pantoprazole, she stated that Dr. Tarri Glenn increased dose to 2 pills/day so she run out of pills last week. [facility] [address]

## 2019-06-05 MED ORDER — PANTOPRAZOLE SODIUM 40 MG PO TBEC: 40.0000 mg | DELAYED_RELEASE_TABLET | Freq: Every day | ORAL | 3 refills | Status: DC

## 2019-06-10 NOTE — Progress Notes (Signed)
This encounter was created in error - please disregard.

## 2019-08-04 ENCOUNTER — Other Ambulatory Visit: Payer: Self-pay | Admitting: Family Medicine

## 2019-08-04 DIAGNOSIS — I10 Essential (primary) hypertension: Secondary | ICD-10-CM

## 2019-08-04 DIAGNOSIS — IMO0002 Reserved for concepts with insufficient information to code with codable children: Secondary | ICD-10-CM

## 2019-08-04 DIAGNOSIS — E785 Hyperlipidemia, unspecified: Secondary | ICD-10-CM

## 2019-08-04 DIAGNOSIS — E1151 Type 2 diabetes mellitus with diabetic peripheral angiopathy without gangrene: Secondary | ICD-10-CM

## 2019-08-05 ENCOUNTER — Other Ambulatory Visit: Payer: Self-pay

## 2019-08-05 DIAGNOSIS — E1151 Type 2 diabetes mellitus with diabetic peripheral angiopathy without gangrene: Secondary | ICD-10-CM

## 2019-08-05 DIAGNOSIS — IMO0002 Reserved for concepts with insufficient information to code with codable children: Secondary | ICD-10-CM

## 2019-08-05 MED ORDER — GLIMEPIRIDE 4 MG PO TABS: 4.0000 mg | ORAL_TABLET | Freq: Every day | ORAL | 0 refills | Status: DC

## 2019-08-05 MED ORDER — LISINOPRIL 20 MG PO TABS: 20.0000 mg | ORAL_TABLET | Freq: Every day | ORAL | 0 refills | Status: DC

## 2019-08-05 NOTE — Telephone Encounter (Signed)
Requesting:alprazolam Contract:04/05/17 UDS:n/a Last Visit:04/14/20, acute Next Visit:n/a Last Refill:06/03/19(90,0) takes tid w/ note to pharmacy, not to exceed 5 refills before 09/13/19.  Please Advise. Medication pending. 30 day supply sent for other 2 medications.

## 2019-08-12 ENCOUNTER — Telehealth: Payer: Self-pay | Admitting: Family Medicine

## 2019-08-12 NOTE — Telephone Encounter (Signed)
Advised patient she was due for a visit and a lab visit. Pt states having a virtual with [place] on Friday.

## 2019-08-12 NOTE — Telephone Encounter (Signed)
Pt states that she would  like to know why Dr. Etter Sjogren stopped her 90day supply of medications?   Covid Vaccine: Pt states that she is nervous about taking the vaccine due to her medication conditions, please advise?Marland Kitchen

## 2019-08-14 ENCOUNTER — Encounter: Payer: Self-pay | Admitting: Family Medicine

## 2019-08-14 ENCOUNTER — Ambulatory Visit (INDEPENDENT_AMBULATORY_CARE_PROVIDER_SITE_OTHER): Payer: 59 | Admitting: Family Medicine

## 2019-08-14 ENCOUNTER — Other Ambulatory Visit: Payer: Self-pay

## 2019-08-14 VITALS — Ht 61.0 in | Wt 215.0 lb

## 2019-08-14 DIAGNOSIS — E1165 Type 2 diabetes mellitus with hyperglycemia: Secondary | ICD-10-CM

## 2019-08-14 DIAGNOSIS — E785 Hyperlipidemia, unspecified: Secondary | ICD-10-CM

## 2019-08-14 DIAGNOSIS — R05 Cough: Secondary | ICD-10-CM

## 2019-08-14 DIAGNOSIS — I10 Essential (primary) hypertension: Secondary | ICD-10-CM

## 2019-08-14 DIAGNOSIS — R059 Cough, unspecified: Secondary | ICD-10-CM

## 2019-08-14 DIAGNOSIS — E1169 Type 2 diabetes mellitus with other specified complication: Secondary | ICD-10-CM | POA: Diagnosis not present

## 2019-08-14 MED ORDER — LISINOPRIL 20 MG PO TABS: 20.0000 mg | ORAL_TABLET | Freq: Every day | ORAL | 1 refills | Status: DC

## 2019-08-14 NOTE — Patient Instructions (Signed)
COVID-19 Vaccine Information can be found at: [email or web address] For questions related to vaccine distribution or appointments, please email [email or web address] or call [phone].    
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
## 2019-08-18 ENCOUNTER — Other Ambulatory Visit: Payer: Self-pay | Admitting: Family Medicine

## 2019-08-18 DIAGNOSIS — R110 Nausea: Secondary | ICD-10-CM

## 2019-08-30 ENCOUNTER — Other Ambulatory Visit: Payer: Self-pay | Admitting: Family Medicine

## 2019-08-30 DIAGNOSIS — I10 Essential (primary) hypertension: Secondary | ICD-10-CM

## 2019-08-30 DIAGNOSIS — K589 Irritable bowel syndrome without diarrhea: Secondary | ICD-10-CM

## 2019-08-31 ENCOUNTER — Other Ambulatory Visit: Payer: Self-pay | Admitting: Gastroenterology

## 2019-09-11 ENCOUNTER — Institutional Professional Consult (permissible substitution): Payer: 59 | Admitting: Internal Medicine

## 2019-09-23 ENCOUNTER — Telehealth: Payer: Self-pay | Admitting: Family Medicine

## 2019-09-23 ENCOUNTER — Institutional Professional Consult (permissible substitution): Payer: 59 | Admitting: Internal Medicine

## 2019-09-23 NOTE — Telephone Encounter (Signed)
Caller : Beth Shepard  Call Back # [phone]  Concern : Pt thinks that she has an outbreak of shingles, patient has had shingles before. Patient has a cream at home and she is wondering if she can still use it.      Name : Mupirocin  22mg 

## 2019-09-24 NOTE — Telephone Encounter (Signed)
Pt advised. Pt did mention that daughter was Dx with shingles last week. Pt states she will try to go to [facility]

## 2019-09-24 NOTE — Telephone Encounter (Signed)
Bactroban won't help if it is shingles --- she should go to [facility] to let them see it so she can get appropriate rx

## 2019-09-24 NOTE — Telephone Encounter (Signed)
Pt wants to know if she can still use Mupirocin for possible shingle outbreak? Please advise

## 2019-10-02 ENCOUNTER — Telehealth: Payer: Self-pay

## 2019-10-02 DIAGNOSIS — IMO0002 Reserved for concepts with insufficient information to code with codable children: Secondary | ICD-10-CM

## 2019-10-02 DIAGNOSIS — E1151 Type 2 diabetes mellitus with diabetic peripheral angiopathy without gangrene: Secondary | ICD-10-CM

## 2019-10-02 MED ORDER — GLIMEPIRIDE 4 MG PO TABS: 4.0000 mg | ORAL_TABLET | Freq: Every day | ORAL | 1 refills | Status: DC
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
## 2019-10-02 NOTE — Telephone Encounter (Signed)
Refill sent.

## 2019-10-05 ENCOUNTER — Other Ambulatory Visit: Payer: Self-pay | Admitting: Family Medicine

## 2019-10-05 DIAGNOSIS — N644 Mastodynia: Secondary | ICD-10-CM

## 2019-10-23 ENCOUNTER — Other Ambulatory Visit: Payer: Self-pay | Admitting: Family Medicine

## 2019-10-23 DIAGNOSIS — I10 Essential (primary) hypertension: Secondary | ICD-10-CM

## 2019-10-23 DIAGNOSIS — E1151 Type 2 diabetes mellitus with diabetic peripheral angiopathy without gangrene: Secondary | ICD-10-CM

## 2019-10-23 DIAGNOSIS — IMO0002 Reserved for concepts with insufficient information to code with codable children: Secondary | ICD-10-CM

## 2019-10-23 DIAGNOSIS — E785 Hyperlipidemia, unspecified: Secondary | ICD-10-CM

## 2019-10-23 NOTE — Telephone Encounter (Signed)
Requesting: Xanax Contract: 04/05/17 UDS: Last OV: 08/14/19 Next OV: N/A Last Refill: 08/05/19-- #90--0 RF Database:   Please advise

## 2019-11-29 ENCOUNTER — Other Ambulatory Visit: Payer: Self-pay | Admitting: Family Medicine

## 2019-11-29 DIAGNOSIS — I10 Essential (primary) hypertension: Secondary | ICD-10-CM

## 2019-11-29 DIAGNOSIS — K589 Irritable bowel syndrome without diarrhea: Secondary | ICD-10-CM

## 2019-12-01 ENCOUNTER — Other Ambulatory Visit: Payer: Self-pay | Admitting: Family Medicine

## 2019-12-01 DIAGNOSIS — I10 Essential (primary) hypertension: Secondary | ICD-10-CM

## 2019-12-01 DIAGNOSIS — K589 Irritable bowel syndrome without diarrhea: Secondary | ICD-10-CM

## 2019-12-02 NOTE — Telephone Encounter (Signed)
Looks like hyoscamine is not covered and alternative is dicyclomine 10mg .   Is this strength okay and also need a sig if it is okay. Please do not just hit approved because it needs a sig and qty.

## 2019-12-07 ENCOUNTER — Other Ambulatory Visit: Payer: Self-pay | Admitting: Family Medicine

## 2019-12-07 DIAGNOSIS — IMO0002 Reserved for concepts with insufficient information to code with codable children: Secondary | ICD-10-CM

## 2019-12-07 DIAGNOSIS — I10 Essential (primary) hypertension: Secondary | ICD-10-CM

## 2019-12-07 DIAGNOSIS — E785 Hyperlipidemia, unspecified: Secondary | ICD-10-CM

## 2019-12-07 DIAGNOSIS — E1151 Type 2 diabetes mellitus with diabetic peripheral angiopathy without gangrene: Secondary | ICD-10-CM

## 2019-12-11 ENCOUNTER — Telehealth: Payer: Self-pay

## 2019-12-11 NOTE — Telephone Encounter (Signed)
Holding for [name] ?

## 2019-12-11 NOTE — Telephone Encounter (Signed)
Patient called in wanting to discuss about what she should do regarding her   Placard

## 2019-12-16 ENCOUNTER — Telehealth: Payer: Self-pay | Admitting: Orthopaedic Surgery

## 2019-12-16 NOTE — Telephone Encounter (Signed)
Pt states she would like to have the placard form mailed to home address @ [address] [place], [address].  [phone]

## 2019-12-16 NOTE — Telephone Encounter (Signed)
Okay to renew  placard? If so, for how long?

## 2019-12-16 NOTE — Telephone Encounter (Signed)
Yes 5 years

## 2019-12-16 NOTE — Telephone Encounter (Signed)
Pt states her Placard is about to expire and needs it to be renewed; pt stated this is her second time calling and would like a CB from [place].   [phone]

## 2019-12-16 NOTE — Telephone Encounter (Signed)
Handicap form ready for pick up in our office at the front desk.

## 2019-12-18 NOTE — Telephone Encounter (Signed)
Holding for [name]/Dr Erlinda Hong to sign

## 2019-12-22 NOTE — Telephone Encounter (Signed)
mailed

## 2019-12-22 NOTE — Telephone Encounter (Signed)
Ok for this? 

## 2019-12-22 NOTE — Telephone Encounter (Signed)
esyes

## 2020-01-05 ENCOUNTER — Telehealth: Payer: Self-pay | Admitting: Family Medicine

## 2020-01-05 DIAGNOSIS — I10 Essential (primary) hypertension: Secondary | ICD-10-CM

## 2020-01-05 DIAGNOSIS — K589 Irritable bowel syndrome without diarrhea: Secondary | ICD-10-CM

## 2020-01-05 MED ORDER — DICYCLOMINE HCL 10 MG PO CAPS: 10.0000 mg | ORAL_CAPSULE | Freq: Three times a day (TID) | ORAL | 1 refills | Status: DC

## 2020-01-05 NOTE — Telephone Encounter (Signed)
New Rx sent. Pt notified.

## 2020-01-05 NOTE — Telephone Encounter (Signed)
Aller: [name] Call back phone number: [phone]  Beth Shepard states she got her new prescription for  dicyclomine frequency 3 times a day however she only got 30 capsule.   Please advise

## 2020-01-21 ENCOUNTER — Encounter: Payer: Self-pay | Admitting: Gastroenterology

## 2020-01-29 ENCOUNTER — Other Ambulatory Visit: Payer: Self-pay

## 2020-01-29 ENCOUNTER — Other Ambulatory Visit (HOSPITAL_BASED_OUTPATIENT_CLINIC_OR_DEPARTMENT_OTHER): Payer: Self-pay | Admitting: Family Medicine

## 2020-01-29 ENCOUNTER — Ambulatory Visit (INDEPENDENT_AMBULATORY_CARE_PROVIDER_SITE_OTHER): Payer: No Typology Code available for payment source | Admitting: Family Medicine

## 2020-01-29 VITALS — BP 132/84 | HR 67 | Temp 99.4°F | Resp 18 | Ht 61.0 in | Wt 207.0 lb

## 2020-01-29 DIAGNOSIS — I10 Essential (primary) hypertension: Secondary | ICD-10-CM | POA: Diagnosis not present

## 2020-01-29 DIAGNOSIS — E2839 Other primary ovarian failure: Secondary | ICD-10-CM

## 2020-01-29 DIAGNOSIS — G5603 Carpal tunnel syndrome, bilateral upper limbs: Secondary | ICD-10-CM

## 2020-01-29 DIAGNOSIS — E1169 Type 2 diabetes mellitus with other specified complication: Secondary | ICD-10-CM | POA: Diagnosis not present

## 2020-01-29 DIAGNOSIS — Z1231 Encounter for screening mammogram for malignant neoplasm of breast: Secondary | ICD-10-CM

## 2020-01-29 DIAGNOSIS — E1165 Type 2 diabetes mellitus with hyperglycemia: Secondary | ICD-10-CM | POA: Diagnosis not present

## 2020-01-29 DIAGNOSIS — R413 Other amnesia: Secondary | ICD-10-CM

## 2020-01-29 DIAGNOSIS — Z23 Encounter for immunization: Secondary | ICD-10-CM | POA: Diagnosis not present

## 2020-01-29 DIAGNOSIS — E785 Hyperlipidemia, unspecified: Secondary | ICD-10-CM | POA: Diagnosis not present

## 2020-01-29 LAB — CBC WITH DIFFERENTIAL/PLATELET
Basophils Absolute: 0.1 10*3/uL (ref 0.0–0.1)
Basophils Relative: 1 % (ref 0.0–3.0)
Eosinophils Absolute: 0.2 10*3/uL (ref 0.0–0.7)
Eosinophils Relative: 2.7 % (ref 0.0–5.0)
HCT: 33.6 % — ABNORMAL LOW (ref 36.0–46.0)
Hemoglobin: 10.9 g/dL — ABNORMAL LOW (ref 12.0–15.0)
Lymphocytes Relative: 19.6 % (ref 12.0–46.0)
Lymphs Abs: 1.5 10*3/uL (ref 0.7–4.0)
MCHC: 32.6 g/dL (ref 30.0–36.0)
MCV: 87 fl (ref 78.0–100.0)
Monocytes Absolute: 0.5 10*3/uL (ref 0.1–1.0)
Monocytes Relative: 5.9 % (ref 3.0–12.0)
Neutro Abs: 5.6 10*3/uL (ref 1.4–7.7)
Neutrophils Relative %: 70.8 % (ref 43.0–77.0)
Platelets: 273 10*3/uL (ref 150.0–400.0)
RBC: 3.86 Mil/uL — ABNORMAL LOW (ref 3.87–5.11)
RDW: 13.7 % (ref 11.5–15.5)
WBC: 7.8 10*3/uL (ref 4.0–10.5)

## 2020-01-29 LAB — COMPREHENSIVE METABOLIC PANEL
ALT: 11 U/L (ref 0–35)
AST: 14 U/L (ref 0–37)
Albumin: 4.1 g/dL (ref 3.5–5.2)
Alkaline Phosphatase: 80 U/L (ref 39–117)
BUN: 31 mg/dL — ABNORMAL HIGH (ref 6–23)
CO2: 26 mEq/L (ref 19–32)
Calcium: 9.5 mg/dL (ref 8.4–10.5)
Chloride: 106 mEq/L (ref 96–112)
Creatinine, Ser: 1.5 mg/dL — ABNORMAL HIGH (ref 0.40–1.20)
GFR: 34.69 mL/min — ABNORMAL LOW (ref >60.00)
Glucose, Bld: 155 mg/dL — ABNORMAL HIGH (ref 70–99)
Potassium: 4.5 mEq/L (ref 3.5–5.1)
Sodium: 137 mEq/L (ref 135–145)
Total Bilirubin: 0.4 mg/dL (ref 0.2–1.2)
Total Protein: 7.3 g/dL (ref 6.0–8.3)

## 2020-01-29 LAB — MICROALBUMIN / CREATININE URINE RATIO
Creatinine,U: 96.2 mg/dL
Microalb Creat Ratio: 1.4 mg/g (ref 0.0–30.0)
Microalb, Ur: 1.3 mg/dL (ref 0.0–1.9)

## 2020-01-29 LAB — LDL CHOLESTEROL, DIRECT: Direct LDL: 108 mg/dL

## 2020-01-29 LAB — LIPID PANEL
Cholesterol: 196 mg/dL (ref 0–200)
HDL: 33.4 mg/dL — ABNORMAL LOW (ref >39.00)
NonHDL: 162.44
Total CHOL/HDL Ratio: 6
Triglycerides: 256 mg/dL — ABNORMAL HIGH (ref 0.0–149.0)
VLDL: 51.2 mg/dL — ABNORMAL HIGH (ref 0.0–40.0)

## 2020-01-29 LAB — VITAMIN B12: Vitamin B-12: 281 pg/mL (ref 211–911)

## 2020-01-29 LAB — HEMOGLOBIN A1C: Hgb A1c MFr Bld: 9.4 % — ABNORMAL HIGH (ref 4.6–6.5)
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
## 2020-01-29 NOTE — Patient Instructions (Signed)

## 2020-02-01 ENCOUNTER — Encounter: Payer: Self-pay | Admitting: Family Medicine

## 2020-02-01 DIAGNOSIS — E1165 Type 2 diabetes mellitus with hyperglycemia: Secondary | ICD-10-CM | POA: Insufficient documentation

## 2020-02-01 NOTE — Assessment & Plan Note (Signed)
hgba1c to be checked, minimize simple carbs. Increase exercise as tolerated. Continue current meds  

## 2020-02-01 NOTE — Assessment & Plan Note (Signed)
Well controlled, no changes to meds. Encouraged heart healthy diet such as the DASH diet and exercise as tolerated.  °

## 2020-02-02 ENCOUNTER — Other Ambulatory Visit: Payer: Self-pay | Admitting: Family Medicine

## 2020-02-02 DIAGNOSIS — R799 Abnormal finding of blood chemistry, unspecified: Secondary | ICD-10-CM

## 2020-02-02 DIAGNOSIS — E1169 Type 2 diabetes mellitus with other specified complication: Secondary | ICD-10-CM

## 2020-02-02 DIAGNOSIS — D509 Iron deficiency anemia, unspecified: Secondary | ICD-10-CM

## 2020-02-02 DIAGNOSIS — E1165 Type 2 diabetes mellitus with hyperglycemia: Secondary | ICD-10-CM

## 2020-02-02 DIAGNOSIS — D619 Aplastic anemia, unspecified: Secondary | ICD-10-CM

## 2020-02-03 ENCOUNTER — Other Ambulatory Visit: Payer: Self-pay | Admitting: Family Medicine

## 2020-02-03 DIAGNOSIS — IMO0002 Reserved for concepts with insufficient information to code with codable children: Secondary | ICD-10-CM

## 2020-02-03 DIAGNOSIS — E785 Hyperlipidemia, unspecified: Secondary | ICD-10-CM

## 2020-02-03 DIAGNOSIS — E1151 Type 2 diabetes mellitus with diabetic peripheral angiopathy without gangrene: Secondary | ICD-10-CM

## 2020-02-03 DIAGNOSIS — I10 Essential (primary) hypertension: Secondary | ICD-10-CM

## 2020-02-03 NOTE — Telephone Encounter (Signed)
Last ov- 01-29-2020 Last refill- 10-23-2019.    90 tabs 1 tab tid prn Next appointment- not scheduled UDS- not complete

## 2020-02-05 ENCOUNTER — Other Ambulatory Visit: Payer: Self-pay

## 2020-02-05 MED ORDER — ROSUVASTATIN CALCIUM 10 MG PO TABS
10.0000 mg | ORAL_TABLET | Freq: Every day | ORAL | 2 refills | Status: DC
Start: 2020-02-05 — End: 2020-05-02

## 2020-02-07 ENCOUNTER — Other Ambulatory Visit: Payer: Self-pay | Admitting: Family Medicine

## 2020-02-07 DIAGNOSIS — E1165 Type 2 diabetes mellitus with hyperglycemia: Secondary | ICD-10-CM

## 2020-02-16 ENCOUNTER — Telehealth: Payer: Self-pay | Admitting: Family Medicine

## 2020-02-16 NOTE — Telephone Encounter (Signed)
Patient asking for a call back in regards to a medical question she had.

## 2020-02-17 NOTE — Telephone Encounter (Signed)
Spoke with patient. Pt was wanting to discuss Endo referral again. Pt decided to wait for referral again and will contact us when ready

## 2020-02-26 ENCOUNTER — Ambulatory Visit (HOSPITAL_BASED_OUTPATIENT_CLINIC_OR_DEPARTMENT_OTHER): Payer: No Typology Code available for payment source

## 2020-02-26 ENCOUNTER — Other Ambulatory Visit (HOSPITAL_BASED_OUTPATIENT_CLINIC_OR_DEPARTMENT_OTHER): Payer: No Typology Code available for payment source

## 2020-02-27 ENCOUNTER — Other Ambulatory Visit: Payer: Self-pay | Admitting: Gastroenterology

## 2020-02-28 ENCOUNTER — Other Ambulatory Visit: Payer: Self-pay | Admitting: Family Medicine

## 2020-02-28 DIAGNOSIS — I10 Essential (primary) hypertension: Secondary | ICD-10-CM

## 2020-02-28 DIAGNOSIS — E1151 Type 2 diabetes mellitus with diabetic peripheral angiopathy without gangrene: Secondary | ICD-10-CM

## 2020-02-28 DIAGNOSIS — IMO0002 Reserved for concepts with insufficient information to code with codable children: Secondary | ICD-10-CM

## 2020-03-01 ENCOUNTER — Ambulatory Visit: Payer: No Typology Code available for payment source | Admitting: Orthopaedic Surgery

## 2020-04-03 ENCOUNTER — Other Ambulatory Visit: Payer: Self-pay | Admitting: Gastroenterology

## 2020-04-15 ENCOUNTER — Ambulatory Visit: Payer: No Typology Code available for payment source | Admitting: Orthopaedic Surgery

## 2020-04-22 ENCOUNTER — Other Ambulatory Visit: Payer: Self-pay | Admitting: Family Medicine

## 2020-04-22 DIAGNOSIS — I10 Essential (primary) hypertension: Secondary | ICD-10-CM

## 2020-04-22 DIAGNOSIS — E785 Hyperlipidemia, unspecified: Secondary | ICD-10-CM

## 2020-04-22 DIAGNOSIS — E1151 Type 2 diabetes mellitus with diabetic peripheral angiopathy without gangrene: Secondary | ICD-10-CM

## 2020-04-22 DIAGNOSIS — IMO0002 Reserved for concepts with insufficient information to code with codable children: Secondary | ICD-10-CM

## 2020-04-25 NOTE — Telephone Encounter (Signed)
Requesting: alprazolam 0.25mg  Contract: 04/05/2017 UDS: 08/30/2016 Last Visit: 01/29/2020 Next Visit: 05/23/2020 Last Refill: 02/03/2020 #90 and 0RF Pt sig: 1 tab tid prn  Please Advise

## 2020-04-27 ENCOUNTER — Ambulatory Visit: Payer: No Typology Code available for payment source | Admitting: Orthopaedic Surgery

## 2020-04-28 ENCOUNTER — Ambulatory Visit: Payer: No Typology Code available for payment source | Admitting: Gastroenterology

## 2020-04-30 ENCOUNTER — Other Ambulatory Visit: Payer: Self-pay | Admitting: Family Medicine

## 2020-05-04 ENCOUNTER — Ambulatory Visit (INDEPENDENT_AMBULATORY_CARE_PROVIDER_SITE_OTHER): Payer: No Typology Code available for payment source | Admitting: Orthopaedic Surgery

## 2020-05-04 ENCOUNTER — Ambulatory Visit (INDEPENDENT_AMBULATORY_CARE_PROVIDER_SITE_OTHER): Payer: No Typology Code available for payment source

## 2020-05-04 ENCOUNTER — Other Ambulatory Visit: Payer: Self-pay

## 2020-05-04 ENCOUNTER — Encounter: Payer: Self-pay | Admitting: Orthopaedic Surgery

## 2020-05-04 DIAGNOSIS — G8929 Other chronic pain: Secondary | ICD-10-CM

## 2020-05-04 DIAGNOSIS — M25511 Pain in right shoulder: Secondary | ICD-10-CM | POA: Diagnosis not present

## 2020-05-04 DIAGNOSIS — M542 Cervicalgia: Secondary | ICD-10-CM | POA: Diagnosis not present

## 2020-05-04 MED ORDER — METHYLPREDNISOLONE ACETATE 40 MG/ML IJ SUSP
40.0000 mg | INTRAMUSCULAR | Status: AC | PRN
  Administered 2020-05-04: 40 mg via INTRA_ARTICULAR

## 2020-05-04 MED ORDER — BUPIVACAINE HCL 0.5 % IJ SOLN
3.0000 mL | INTRAMUSCULAR | Status: AC | PRN
  Administered 2020-05-04: 3 mL via INTRA_ARTICULAR

## 2020-05-04 MED ORDER — LIDOCAINE HCL 1 % IJ SOLN
3.0000 mL | INTRAMUSCULAR | Status: AC | PRN
  Administered 2020-05-04: 3 mL

## 2020-05-04 NOTE — Progress Notes (Signed)
Office Visit Note   Patient: Beth Shepard           Date of Birth: 06-16-1956           MRN: [id number] Visit Date: 05/04/2020              Requested by: [address], [place], [place] [address] [place],   [id number] PCP: Carollee Herter, Alferd Apa, DO   Assessment & Plan: Visit Diagnoses:  1. Neck pain   2. Chronic right shoulder pain     Plan: Impression is right shoulder pain rotator cuff syndrome.  Based on discussion of treatment options patient agreed to subacromial injection as well as home exercise program.  She will follow up if she does not feel any improvement.  Follow-Up Instructions: Return if symptoms worsen or fail to improve.   Orders:  Orders Placed This Encounter  Procedures  . XR Shoulder Right  . XR Cervical Spine 2 or 3 views   No orders of the defined types were placed in this encounter.     Procedures: Large Joint Inj: R subacromial bursa on 05/04/2020 11:36 AM Indications: pain Details: 22 G needle  Arthrogram: No  Medications: 3 mL lidocaine 1 %; 3 mL bupivacaine 0.5 %; 40 mg methylPREDNISolone acetate 40 MG/ML Outcome: tolerated well, no immediate complications Consent was given by the patient. Patient was prepped and draped in the usual sterile fashion.       Clinical Data: No additional findings.   Subjective: Chief Complaint  Patient presents with  . Right Shoulder - Pain    Beth Shepard is a 64 year old female who is well-known to me comes in for evaluation of mainly right shoulder and neck pain.  She has pain that radiates from the shoulder down to the elbow and sometimes up into the neck.  Denies any numbness and tingling or radicular symptoms.  She does have numbness and tingling in her fingertips which makes that difficult for buttoning shirts.  She has pain in the shoulder with use of the arm and elevation above the head.   Review of Systems  Constitutional: Negative.   HENT: Negative.   Eyes: Negative.     Respiratory: Negative.   Cardiovascular: Negative.   Endocrine: Negative.   Musculoskeletal: Negative.   Neurological: Negative.   Hematological: Negative.   Psychiatric/Behavioral: Negative.   All other systems reviewed and are negative.    Objective: Vital Signs: There were no vitals taken for this visit.  Physical Exam Vitals and nursing note reviewed.  Constitutional:      Appearance: She is well-developed.  Pulmonary:     Effort: Pulmonary effort is normal.  Skin:    General: Skin is warm.     Capillary Refill: Capillary refill takes less than 2 seconds.  Neurological:     Mental Status: She is alert and oriented to person, place, and time.  Psychiatric:        Behavior: Behavior normal.        Thought Content: Thought content normal.        Judgment: Judgment normal.     Ortho Exam Right shoulder shows full range of motion with moderate pain.  Positive Hawkins.  Positive empty can.  Negative Speed.  Manual muscle testing is grossly intact.  Negative Spurling's. Specialty Comments:  No specialty comments available.  Imaging: XR Cervical Spine 2 or 3 views  Result Date: 05/04/2020 Multilevel degenerative disc disease.  No acute abnormalities.  XR Shoulder  Right  Result Date: 05/04/2020 Mild degenerative changes of the greater tuberosity.  Degenerative changes of the [facility] joint.  Mild OA of the glenohumeral joint.  No acute abnormalities.    PMFS History: Patient Active Problem List   Diagnosis Date Noted  . Uncontrolled type 2 diabetes mellitus with hyperglycemia ([place]) 02/01/2020  . Nausea without vomiting 01/27/2019  . Preventative health care 12/18/2017  . Infected sebaceous cyst of skin 07/19/2017  . DCM (dilated cardiomyopathy) ([place]) 07/10/2017  . Fibromyalgia 04/05/2017  . Chronic pain syndrome 04/05/2017  . Pain in both lower extremities 02/06/2017  . Chronic venous insufficiency 02/06/2017  . Cellulitis of lower extremity 08/28/2016  . Pain in  right ankle and joints of right foot 08/09/2016  . Peripheral edema 07/10/2016  . Trochanteric bursitis, right hip 05/03/2016  . Chronic kidney disease (CKD) stage G3a/A1, moderately decreased glomerular filtration rate (GFR) between 45-59 mL/min/1.73 square meter and albuminuria creatinine ratio less than 30 mg/g (HCC) 04/07/2016  . Primary osteoarthritis of right hip 10/24/2015  . Hip joint replacement status 10/24/2015  . Chest pain 08/25/2015  . Right hip pain 08/25/2015  . Chest pain, precordial 08/10/2015  . Precordial pain 08/10/2015  . Cardiomyopathy ([place]) 01/18/2015  . Block, bundle branch, left 01/18/2015  . Left bundle-branch block 01/18/2015  . Diabetes ([place]) 01/18/2015  . Nausea alone 12/17/2013  . Diarrhea 11/19/2013  . Bloating 11/19/2013  . Obesity (BMI 30-39.9) 10/21/2013  . Multiple joint pain 11/13/2012  . MYALGIA 08/29/2010  . ABDOMINAL PAIN OTHER SPECIFIED SITE 08/22/2009  . OSTEOARTHROS UNSPEC WHETHER GEN/LOC UNSPEC SITE 03/17/2009  . Cellulitis and abscess of face 12/17/2008  . Disturbance of skin sensation 12/17/2008  . ACUTE BRONCHITIS 05/07/2008  . Anxiety 12/04/2007  . SINUSITIS, ACUTE NOS 12/19/2006  . DM (diabetes mellitus) type II uncontrolled, periph vascular disorder ([place]) 10/08/2006  . Hyperlipidemia LDL goal <70 10/08/2006  . Essential hypertension 10/08/2006  . COPD (chronic obstructive pulmonary disease) ([place]) 10/08/2006  . GERD 10/08/2006  . Other postprocedural status(V45.89) 10/08/2006   Past Medical History:  Diagnosis Date  . Anxiety   . Bilateral edema of lower extremity    Swelling in legs and ankles  . Congestive heart failure ([place])   . COPD (chronic obstructive pulmonary disease) ([place])   . Diabetes mellitus    type 2  . Gastric polyps   . GERD (gastroesophageal reflux disease)    chronic  . Hyperlipidemia   . Hypertension   . IBS (irritable bowel syndrome)   . LBBB (left bundle branch block)    Dr. Gwenith Spitz, [facility]  Cardiology  . Pneumonia    hx of  . PONV (postoperative nausea and vomiting)    Had N/V after having tubal ligation and D&C  . Rheumatoid arthritis ([place])   . Shingles     Family History  Problem Relation Age of Onset  . Lymphoma Mother   . Heart disease Mother        chf, cabg x6  . Hypertension Mother   . GER disease Mother   . Hiatal hernia Mother   . Gallstones Mother   . Brain cancer Sister        brain stage 4  . Heart disease Maternal Grandmother 8       MI  . Heart disease Maternal Grandfather   . Stroke Paternal Grandmother   . Hypertension Paternal Grandmother   . Stroke Paternal Grandfather   . Hypertension Paternal Grandfather   . Rheum arthritis Father   .  Hypertension Daughter   . Colon cancer Neg Hx   . Esophageal cancer Neg Hx   . Pancreatic cancer Neg Hx   . Liver disease Neg Hx   . Kidney disease Neg Hx     Past Surgical History:  Procedure Laterality Date  . CARDIAC CATHETERIZATION     No PCI  . CHOLECYSTECTOMY    . COLONOSCOPY  06/06/2005   normal   . DILATION AND CURETTAGE OF UTERUS    . ESOPHAGOGASTRODUODENOSCOPY  06/06/2005, 11/17/2013  . JOINT REPLACEMENT Right 10/24/2015   rt hip replacement  . KNEE ARTHROSCOPY Left   . TOTAL HIP ARTHROPLASTY Right 10/24/2015   Procedure: RIGHT TOTAL HIP ARTHROPLASTY ANTERIOR APPROACH;  Surgeon: Leandrew Koyanagi, MD;  Location: [facility];  Service: Orthopedics;  Laterality: Right;  . TUBAL LIGATION    . VAGINAL HYSTERECTOMY     Social History   Occupational History  . Occupation: Administive     Employer: [employer]  Tobacco Use  . Smoking status: Former Smoker    Years: 33.00    Types: Cigarettes    Quit date: 06/25/2002    Years since quitting: 17.8  . Smokeless tobacco: Never Used  Vaping Use  . Vaping Use: Never used  Substance and Sexual Activity  . Alcohol use: Yes    Comment: rare  . Drug use: No  . Sexual activity: Not Currently    Partners: Male

## 2020-05-08 ENCOUNTER — Other Ambulatory Visit: Payer: Self-pay | Admitting: Family Medicine

## 2020-05-08 DIAGNOSIS — IMO0002 Reserved for concepts with insufficient information to code with codable children: Secondary | ICD-10-CM

## 2020-05-08 DIAGNOSIS — E1151 Type 2 diabetes mellitus with diabetic peripheral angiopathy without gangrene: Secondary | ICD-10-CM

## 2020-05-08 DIAGNOSIS — E1165 Type 2 diabetes mellitus with hyperglycemia: Secondary | ICD-10-CM

## 2020-05-08 DIAGNOSIS — E785 Hyperlipidemia, unspecified: Secondary | ICD-10-CM

## 2020-05-08 DIAGNOSIS — I10 Essential (primary) hypertension: Secondary | ICD-10-CM

## 2020-05-11 ENCOUNTER — Encounter: Payer: Self-pay | Admitting: Physician Assistant

## 2020-05-11 ENCOUNTER — Ambulatory Visit (INDEPENDENT_AMBULATORY_CARE_PROVIDER_SITE_OTHER): Payer: No Typology Code available for payment source | Admitting: Physician Assistant

## 2020-05-11 VITALS — BP 108/60 | HR 82 | Ht 61.0 in | Wt 200.0 lb

## 2020-05-11 DIAGNOSIS — K589 Irritable bowel syndrome without diarrhea: Secondary | ICD-10-CM | POA: Diagnosis not present

## 2020-05-11 DIAGNOSIS — K219 Gastro-esophageal reflux disease without esophagitis: Secondary | ICD-10-CM | POA: Diagnosis not present

## 2020-05-11 MED ORDER — ONDANSETRON HCL 4 MG PO TABS
ORAL_TABLET | ORAL | 1 refills | Status: DC
Start: 2020-05-11 — End: 2021-11-03

## 2020-05-11 MED ORDER — HYOSCYAMINE SULFATE 0.125 MG SL SUBL: 0.1250 mg | SUBLINGUAL_TABLET | SUBLINGUAL | 6 refills | Status: DC | PRN

## 2020-05-11 MED ORDER — GLYCOPYRROLATE 2 MG PO TABS: 2.0000 mg | ORAL_TABLET | Freq: Two times a day (BID) | ORAL | 11 refills | Status: DC

## 2020-05-11 MED ORDER — GLYCOPYRROLATE 2 MG PO TABS: 2.0000 mg | ORAL_TABLET | Freq: Two times a day (BID) | ORAL | Status: DC

## 2020-05-11 MED ORDER — PANTOPRAZOLE SODIUM 40 MG PO TBEC
40.0000 mg | DELAYED_RELEASE_TABLET | Freq: Every morning | ORAL | 11 refills | Status: DC
Start: 2020-05-11 — End: 2021-05-17

## 2020-05-11 NOTE — Progress Notes (Signed)
Subjective:    Patient ID: Beth Shepard, female    DOB: [birth year], 64 y.o.   MRN: [id number]  HPI Beth Shepard is a pleasant 64 year old white female, established with Dr. Tarri Glenn, who was last seen in the office in March 2020 and underwent endoscopic evaluation with EGD and colon in August 2020.  She comes in today for follow-up discussion and medication refills. She has history of adult onset diabetes mellitus, hypertension, dilated cardiomyopathy with most recent EF of 45 to 50%, COPD, and chronic kidney disease stage III. Upper endoscopy in August 2020 was pertinent for an irregular Z-line and patchy severe erythema in the duodenal bulb.  Biopsy showed no evidence of Barrett's and duodenal biopsies were benign.  At colonoscopy she had removal of 15 polyps, the largest was 4 mm and all were tubular adenomas.  Random biopsies were negative. She had also had TTG and IgA which were negative. She had been placed on dicyclomine 10 mg p.o. 4 times daily, has been using Zofran as needed and taking Protonix 40 mg p.o. every morning.  She feels that the Protonix does help her reflux symptoms, she has no complaints of dysphagia.  She does not feel that she gets much of any benefit from the dicyclomine.  She had had hyoscyamine at one point in the past and said that was quite helpful however her insurance apparently currently will not cover.  She uses Zofran for periodic episodes with nausea vomiting cramping and diarrhea.  She says she has ongoing GI symptoms of some sort every week and may have an episode of nausea abdominal cramping and diarrhea a few times per month.  She does have days of normal bowel movements and even days without bowel movements. She had run out of Protonix a few months ago. She is most distressed by these repeated episodes of nausea cramping and diarrhea.  Review of Systems Pertinent positive and negative review of systems were noted in the above HPI section.  All other review of  systems was otherwise negative.  Outpatient Encounter Medications as of 05/11/2020  Medication Sig  . ALPRAZolam (XANAX) 0.25 MG tablet TAKE 1 TABLET BY MOUTH THREE TIMES A DAY AS NEEDED  . aspirin 81 MG tablet Take 81 mg by mouth daily.  . Calcium Carb-Cholecalciferol (CALCIUM-VITAMIN D) 500-200 MG-UNIT tablet Take by mouth.  . carvedilol (COREG) 12.5 MG tablet Take 1 tablet (12.5 mg total) by mouth 2 (two) times daily.  Marland Kitchen dicyclomine (BENTYL) 10 MG capsule Take 1 capsule (10 mg total) by mouth 4 (four) times daily -  before meals and at bedtime.  . furosemide (LASIX) 20 MG tablet Take 1 tablet (20 mg total) by mouth daily.  Marland Kitchen gabapentin (NEURONTIN) 100 MG capsule TAKE 1 CAPSULE BY MOUTH THREE TIMES A DAY  . glimepiride (AMARYL) 4 MG tablet TAKE 1 TABLET (4 MG TOTAL) BY MOUTH DAILY BEFORE BREAKFAST.  Marland Kitchen glucose blood (ONE TOUCH TEST STRIPS) test strip 1 each by Other route as needed. Reported on 11/02/2015  . lisinopril (ZESTRIL) 20 MG tablet TAKE 1 TABLET BY MOUTH EVERY DAY  . ondansetron (ZOFRAN) 4 MG tablet TAKE 1-2 TABLETS BY MOUTH EVERY 8 HOURS AS NEEDED FOR NAUSEA  . pantoprazole (PROTONIX) 40 MG tablet Take 1 tablet (40 mg total) by mouth in the morning.  . rosuvastatin (CRESTOR) 10 MG tablet TAKE 1 TABLET BY MOUTH EVERYDAY AT BEDTIME  . spironolactone (ALDACTONE) 25 MG tablet Take 1 tablet (25 mg total) by mouth every other day. Dr  Fitzgerald  . [DISCONTINUED] ondansetron (ZOFRAN) 4 MG tablet TAKE 1-2 TABLETS BY MOUTH EVERY 8 HOURS AS NEEDED FOR NAUSEA  . [DISCONTINUED] pantoprazole (PROTONIX) 40 MG tablet Take 1 tablet (40 mg total) by mouth in the morning. Please schedule a follow up appt for further refills. Thank you  . albuterol (PROVENTIL HFA) 108 (90 Base) MCG/ACT inhaler Inhale 2 puffs into the lungs every 6 (six) hours as needed for wheezing.  Marland Kitchen glycopyrrolate (ROBINUL) 2 MG tablet Take 1 tablet (2 mg total) by mouth 2 (two) times daily. FOR IBS  . hyoscyamine (LEVSIN SL) 0.125  MG SL tablet Place 1 tablet (0.125 mg total) under the tongue every 4 (four) hours as needed for cramping.  Marland Kitchen omega-3 acid ethyl esters (LOVAZA) 1 g capsule Take 2 g by mouth. (Patient not taking: Reported on 05/11/2020)  . [DISCONTINUED] glycopyrrolate (ROBINUL) 2 MG tablet Take 1 tablet (2 mg total) by mouth 2 (two) times daily. FOR IBS   No facility-administered encounter medications on file as of 05/11/2020.   Allergies  Allergen Reactions  . Prednisone Palpitations and Other (See Comments)    Dose pack causes vomiting; Causes Gerd N/v/d N/v/d   . Codeine Nausea Only and Palpitations    REACTION: palpitation  REACTION: palpitation    . Ibuprofen Other (See Comments) and Rash    REACTION: questionable. Pt states that she can take this sometimes. GERD   Patient Active Problem List   Diagnosis Date Noted  . IBS (irritable bowel syndrome) 05/11/2020  . Uncontrolled type 2 diabetes mellitus with hyperglycemia ([place]) 02/01/2020  . Nausea without vomiting 01/27/2019  . Preventative health care 12/18/2017  . Infected sebaceous cyst of skin 07/19/2017  . DCM (dilated cardiomyopathy) ([place]) 07/10/2017  . Fibromyalgia 04/05/2017  . Chronic pain syndrome 04/05/2017  . Pain in both lower extremities 02/06/2017  . Chronic venous insufficiency 02/06/2017  . Cellulitis of lower extremity 08/28/2016  . Pain in right ankle and joints of right foot 08/09/2016  . Peripheral edema 07/10/2016  . Trochanteric bursitis, right hip 05/03/2016  . Chronic kidney disease (CKD) stage G3a/A1, moderately decreased glomerular filtration rate (GFR) between 45-59 mL/min/1.73 square meter and albuminuria creatinine ratio less than 30 mg/g (HCC) 04/07/2016  . Primary osteoarthritis of right hip 10/24/2015  . Hip joint replacement status 10/24/2015  . Chest pain 08/25/2015  . Right hip pain 08/25/2015  . Chest pain, precordial 08/10/2015  . Precordial pain 08/10/2015  . Cardiomyopathy ([place]) 01/18/2015  .  Block, bundle branch, left 01/18/2015  . Left bundle-branch block 01/18/2015  . Diabetes ([place]) 01/18/2015  . Nausea alone 12/17/2013  . Diarrhea 11/19/2013  . Bloating 11/19/2013  . Obesity (BMI 30-39.9) 10/21/2013  . Multiple joint pain 11/13/2012  . MYALGIA 08/29/2010  . ABDOMINAL PAIN OTHER SPECIFIED SITE 08/22/2009  . OSTEOARTHROS UNSPEC WHETHER GEN/LOC UNSPEC SITE 03/17/2009  . Cellulitis and abscess of face 12/17/2008  . Disturbance of skin sensation 12/17/2008  . ACUTE BRONCHITIS 05/07/2008  . Anxiety 12/04/2007  . SINUSITIS, ACUTE NOS 12/19/2006  . DM (diabetes mellitus) type II uncontrolled, periph vascular disorder ([place]) 10/08/2006  . Hyperlipidemia LDL goal <70 10/08/2006  . Essential hypertension 10/08/2006  . COPD (chronic obstructive pulmonary disease) ([place]) 10/08/2006  . GERD 10/08/2006  . Other postprocedural status(V45.89) 10/08/2006   Social History   Socioeconomic History  . Marital status: Widowed    Spouse name: Not on file  . Number of children: 2  . Years of education: Not on file  .  Highest education level: Not on file  Occupational History  . Occupation: Administive     Employer: [employer]  Tobacco Use  . Smoking status: Former Smoker    Years: 33.00    Types: Cigarettes    Quit date: 06/25/2002    Years since quitting: 17.8  . Smokeless tobacco: Never Used  Vaping Use  . Vaping Use: Never used  Substance and Sexual Activity  . Alcohol use: Yes    Comment: rare  . Drug use: No  . Sexual activity: Not Currently    Partners: Male  Other Topics Concern  . Not on file  Social History Narrative   Limited exercise per cardiology   Social Determinants of Health   Financial Resource Strain:   . Difficulty of Paying Living Expenses: Not on file  Food Insecurity:   . Worried About Charity fundraiser in the Last Year: Not on file  . Ran Out of Food in the Last Year: Not on file  Transportation Needs:   . Lack of Transportation  (Medical): Not on file  . Lack of Transportation (Non-Medical): Not on file  Physical Activity:   . Days of Exercise per Week: Not on file  . Minutes of Exercise per Session: Not on file  Stress:   . Feeling of Stress : Not on file  Social Connections:   . Frequency of Communication with Friends and Family: Not on file  . Frequency of Social Gatherings with Friends and Family: Not on file  . Attends Religious Services: Not on file  . Active Member of Clubs or Organizations: Not on file  . Attends Archivist Meetings: Not on file  . Marital Status: Not on file  Intimate Partner Violence:   . Fear of Current or Ex-Partner: Not on file  . Emotionally Abused: Not on file  . Physically Abused: Not on file  . Sexually Abused: Not on file    Ms. Comins's family history includes Brain cancer in her sister; GER disease in her mother; Gallstones in her mother; Heart disease in her maternal grandfather and mother; Heart disease (age of onset: 52) in her maternal grandmother; Hiatal hernia in her mother; Hypertension in her daughter, mother, paternal grandfather, and paternal grandmother; Lymphoma in her mother; Rheum arthritis in her father; Stroke in her paternal grandfather and paternal grandmother.      Objective:    Vitals:   05/11/20 1047  BP: 108/60  Pulse: 82  SpO2: 95%    Physical Exam Well-developed well-nourished older white female in no acute distress.  Height, Weight,200  BMI37.7  HEENT; nontraumatic normocephalic, EOMI, PE R LA, sclera anicteric. Oropharynx; not examined Neck; supple, no JVD Cardiovascular; regular rate and rhythm with S1-S2, no murmur rub or gallop Pulmonary; Clear bilaterally Abdomen; soft, no focal tenderness nondistended, no palpable mass or hepatosplenomegaly, bowel sounds are active Rectal; not done today Skin; benign exam, no jaundice rash or appreciable lesions Extremities; no clubbing cyanosis or edema skin warm and dry Neuro/Psych;  alert and oriented x4, grossly nonfocal mood and affect appropriate       Assessment & Plan:   #69  64 year old white female with history of GERD-controlled on Protonix 40 mg p.o. daily however has been out over the past couple of months. #2 IBS with episodic diarrhea and nausea. #3 numerous tubular adenomatous polyps-15 polyps removed at time of colonoscopy August 2020 and was indicated for 1 year interval follow-up #4 COPD no oxygen use 5.  Adult  onset diabetes mellitus 6.  Chronic kidney disease stage III 7.  Dilated cardiomyopathy with EF 45 to 50% #8 hypertension  Plan; restart Protonix 40 mg p.o. every morning and refill x1 year Zofran 4 mg every 6 hours as needed for nausea As she has not had any benefit from dicyclomine, will start a trial of glycopyrrolate 2 mg p.o. twice daily. I discussed follow-up colonoscopy with patient today and strongly advised her to proceed with scheduling follow-up colonoscopy today.  She says that she will follow through with colonoscopy but would like to schedule this in January and needs to work out Environmental manager.  She is advised to call back within the next week or so to schedule her procedure . She was also asked to call back in a couple of weeks if she is not having any benefit from glycopyrrolate.  Henya Aguallo S Kim Oki PA-C 05/11/2020   Cc: Ann Held, *

## 2020-05-11 NOTE — Progress Notes (Signed)
Reviewed and agree with management plans. ? ?Dakia Schifano L. Purvis Sidle, MD, MPH  ?

## 2020-05-11 NOTE — Patient Instructions (Addendum)
If you are age 66 or older, your body mass index should be between 23-30. Your Body mass index is 37.79 kg/m. If this is out of the aforementioned range listed, please consider follow up with your Primary Care Provider.  If you are age 63 or younger, your body mass index should be between 19-25. Your Body mass index is 37.79 kg/m. If this is out of the aformentioned range listed, please consider follow up with your Primary Care Provider.   It has been recommended to you by your physician that you have a(n) Colonoscopy completed. Per your request, we did not schedule the procedure(s) today. Please contact our office at [phone] should you decide to have the procedure completed. You will be scheduled for a pre-visit and procedure at that time.  Continue Pantoprazole 40 mg daily  We have sent in Levsin sublingual tablets to your pharmacy along with Glycopyrrolate 2 mg.

## 2020-05-23 ENCOUNTER — Ambulatory Visit: Payer: No Typology Code available for payment source | Admitting: Family Medicine

## 2020-05-31 ENCOUNTER — Ambulatory Visit: Payer: No Typology Code available for payment source | Admitting: Family Medicine

## 2020-07-16 ENCOUNTER — Other Ambulatory Visit: Payer: Self-pay | Admitting: Family Medicine

## 2020-07-16 DIAGNOSIS — I10 Essential (primary) hypertension: Secondary | ICD-10-CM

## 2020-07-16 DIAGNOSIS — K589 Irritable bowel syndrome without diarrhea: Secondary | ICD-10-CM

## 2020-07-18 ENCOUNTER — Other Ambulatory Visit: Payer: Self-pay | Admitting: Physician Assistant

## 2020-07-18 DIAGNOSIS — I10 Essential (primary) hypertension: Secondary | ICD-10-CM

## 2020-07-18 DIAGNOSIS — K589 Irritable bowel syndrome without diarrhea: Secondary | ICD-10-CM

## 2020-07-30 ENCOUNTER — Other Ambulatory Visit: Payer: Self-pay | Admitting: Family Medicine

## 2020-08-03 ENCOUNTER — Other Ambulatory Visit: Payer: Self-pay | Admitting: Family Medicine

## 2020-08-03 DIAGNOSIS — E785 Hyperlipidemia, unspecified: Secondary | ICD-10-CM

## 2020-08-03 DIAGNOSIS — IMO0002 Reserved for concepts with insufficient information to code with codable children: Secondary | ICD-10-CM

## 2020-08-03 DIAGNOSIS — E1151 Type 2 diabetes mellitus with diabetic peripheral angiopathy without gangrene: Secondary | ICD-10-CM

## 2020-08-03 DIAGNOSIS — I10 Essential (primary) hypertension: Secondary | ICD-10-CM

## 2020-08-03 DIAGNOSIS — E1165 Type 2 diabetes mellitus with hyperglycemia: Secondary | ICD-10-CM

## 2020-08-03 NOTE — Telephone Encounter (Signed)
Requesting: alprazolam 0.25mg  Contract: 04/05/2017 UDS:  08/30/2016 Last Visit: 05/11/2020 w/ Percell Miller  Next Visit: None Last Refill: 04/25/2020 #90 and 0RF Pt sig: 1 tab tid prn  Please Advise

## 2020-09-01 ENCOUNTER — Ambulatory Visit: Payer: No Typology Code available for payment source | Admitting: Family Medicine

## 2020-09-08 ENCOUNTER — Other Ambulatory Visit: Payer: Self-pay | Admitting: Family Medicine

## 2020-09-08 DIAGNOSIS — E1151 Type 2 diabetes mellitus with diabetic peripheral angiopathy without gangrene: Secondary | ICD-10-CM

## 2020-09-08 DIAGNOSIS — IMO0002 Reserved for concepts with insufficient information to code with codable children: Secondary | ICD-10-CM

## 2020-09-08 DIAGNOSIS — I10 Essential (primary) hypertension: Secondary | ICD-10-CM

## 2020-09-21 ENCOUNTER — Ambulatory Visit (INDEPENDENT_AMBULATORY_CARE_PROVIDER_SITE_OTHER): Payer: No Typology Code available for payment source | Admitting: Orthopaedic Surgery

## 2020-09-21 ENCOUNTER — Other Ambulatory Visit: Payer: Self-pay

## 2020-09-21 DIAGNOSIS — M25511 Pain in right shoulder: Secondary | ICD-10-CM

## 2020-09-21 DIAGNOSIS — G8929 Other chronic pain: Secondary | ICD-10-CM

## 2020-09-21 MED ORDER — TRAMADOL HCL 50 MG PO TABS: 50.0000 mg | ORAL_TABLET | Freq: Three times a day (TID) | ORAL | 0 refills | Status: DC | PRN

## 2020-09-21 NOTE — Progress Notes (Signed)
Office Visit Note   Patient: Beth Shepard           Date of Birth: [birth year]           MRN: [id number] Visit Date: 09/21/2020              Requested by: [address] [place], [place] [address] [place],  [place] [address] PCP: Carollee Herter, Alferd Apa, DO   Assessment & Plan: Visit Diagnoses:  1. Chronic right shoulder pain     Plan: Impression is chronic right shoulder pain concerning for rotator cuff pathology.  At this point, we will order an MRI of the right shoulder to assess her rotator cuff.  Follow-up with [place] once has been completed.  Total face to face encounter time was greater than 25 minutes and over half of this time was spent in counseling and/or coordination of care.  Follow-Up Instructions: Return for follow up after MRI.   Orders:  No orders of the defined types were placed in this encounter.  Meds ordered this encounter  Medications  . traMADol (ULTRAM) 50 MG tablet    Sig: Take 1 tablet (50 mg total) by mouth 3 (three) times daily as needed.    Dispense:  30 tablet    Refill:  0      Procedures: No procedures performed   Clinical Data: No additional findings.   Subjective: Chief Complaint  Patient presents with  . Right Shoulder - Pain  . Left Shoulder - Pain    HPI patient is a pleasant 65 year old female who comes in today with continued right shoulder pain.  She was seen in our office several months ago with right shoulder subacromial space was injected with cortisone.  She notes that this mildly helps.  She continues to have pain from the elbow to the shoulder.  This is intermittent in nature but worse with internal rotation.  She has associated weakness.  She has been doing her exercises without significant relief.  She has unable to take NSAIDs due to GI upset and Tylenol does not seem to relieve her symptoms.  She does note numbness to the entire arm.  Review of Systems as detailed in HPI.  All others reviewed and are  negative.   Objective: Vital Signs: There were no vitals taken for this visit.  Physical Exam well-developed well-nourished female no acute distress.  Alert oriented x3.  Ortho Exam right shoulder exam shows 50% range of motion.  Internal rotation to the back pocket.  Markedly positive empty can.  3 out of 5 strength throughout.  She is neurovascular intact distally.  Specialty Comments:  No specialty comments available.  Imaging: No new imaging   PMFS History: Patient Active Problem List   Diagnosis Date Noted  . IBS (irritable bowel syndrome) 05/11/2020  . Uncontrolled type 2 diabetes mellitus with hyperglycemia ([place]) 02/01/2020  . Nausea without vomiting 01/27/2019  . Preventative health care 12/18/2017  . Infected sebaceous cyst of skin 07/19/2017  . DCM (dilated cardiomyopathy) ([place]) 07/10/2017  . Fibromyalgia 04/05/2017  . Chronic pain syndrome 04/05/2017  . Pain in both lower extremities 02/06/2017  . Chronic venous insufficiency 02/06/2017  . Cellulitis of lower extremity 08/28/2016  . Pain in right ankle and joints of right foot 08/09/2016  . Peripheral edema 07/10/2016  . Trochanteric bursitis, right hip 05/03/2016  . Chronic kidney disease (CKD) stage G3a/A1, moderately decreased glomerular filtration rate (GFR) between 45-59 mL/min/1.73 square meter and albuminuria creatinine  ratio less than 30 mg/g ([place]) 04/07/2016  . Primary osteoarthritis of right hip 10/24/2015  . Hip joint replacement status 10/24/2015  . Chest pain 08/25/2015  . Right hip pain 08/25/2015  . Chest pain, precordial 08/10/2015  . Precordial pain 08/10/2015  . Cardiomyopathy ([place]) 01/18/2015  . Block, bundle branch, left 01/18/2015  . Left bundle-branch block 01/18/2015  . Diabetes ([place]) 01/18/2015  . Nausea alone 12/17/2013  . Diarrhea 11/19/2013  . Bloating 11/19/2013  . Obesity (BMI 30-39.9) 10/21/2013  . Multiple joint pain 11/13/2012  . MYALGIA 08/29/2010  . ABDOMINAL PAIN OTHER  SPECIFIED SITE 08/22/2009  . OSTEOARTHROS UNSPEC WHETHER GEN/LOC UNSPEC SITE 03/17/2009  . Cellulitis and abscess of face 12/17/2008  . Disturbance of skin sensation 12/17/2008  . ACUTE BRONCHITIS 05/07/2008  . Anxiety 12/04/2007  . SINUSITIS, ACUTE NOS 12/19/2006  . DM (diabetes mellitus) type II uncontrolled, periph vascular disorder ([place]) 10/08/2006  . Hyperlipidemia LDL goal <70 10/08/2006  . Essential hypertension 10/08/2006  . COPD (chronic obstructive pulmonary disease) ([place]) 10/08/2006  . GERD 10/08/2006  . Other postprocedural status(V45.89) 10/08/2006   Past Medical History:  Diagnosis Date  . Anxiety   . Bilateral edema of lower extremity    Swelling in legs and ankles  . Congestive heart failure ([place])   . COPD (chronic obstructive pulmonary disease) ([place])   . Diabetes mellitus    type 2  . Gastric polyps   . GERD (gastroesophageal reflux disease)    chronic  . Hyperlipidemia   . Hypertension   . IBS (irritable bowel syndrome)   . LBBB (left bundle branch block)    Dr. Gwenith Spitz, [facility]  . Pneumonia    hx of  . PONV (postoperative nausea and vomiting)    Had N/V after having tubal ligation and D&C  . Rheumatoid arthritis ([place])   . Shingles     Family History  Problem Relation Age of Onset  . Lymphoma Mother   . Heart disease Mother        chf, cabg x6  . Hypertension Mother   . GER disease Mother   . Hiatal hernia Mother   . Gallstones Mother   . Brain cancer Sister        brain stage 4  . Heart disease Maternal Grandmother 31       MI  . Heart disease Maternal Grandfather   . Stroke Paternal Grandmother   . Hypertension Paternal Grandmother   . Stroke Paternal Grandfather   . Hypertension Paternal Grandfather   . Rheum arthritis Father   . Hypertension Daughter   . Colon cancer Neg Hx   . Esophageal cancer Neg Hx   . Pancreatic cancer Neg Hx   . Liver disease Neg Hx   . Kidney disease Neg Hx     Past Surgical History:   Procedure Laterality Date  . CARDIAC CATHETERIZATION     No PCI  . CHOLECYSTECTOMY    . COLONOSCOPY  06/06/2005   normal   . DILATION AND CURETTAGE OF UTERUS    . ESOPHAGOGASTRODUODENOSCOPY  06/06/2005, 11/17/2013  . JOINT REPLACEMENT Right 10/24/2015   rt hip replacement  . KNEE ARTHROSCOPY Left   . TOTAL HIP ARTHROPLASTY Right 10/24/2015   Procedure: RIGHT TOTAL HIP ARTHROPLASTY ANTERIOR APPROACH;  Surgeon: Leandrew Koyanagi, MD;  Location: [facility];  Service: Orthopedics;  Laterality: Right;  . TUBAL LIGATION    . VAGINAL HYSTERECTOMY     Social History   Occupational History  .  Occupation: Administive     Employer: [employer]  Tobacco Use  . Smoking status: Former Smoker    Years: 33.00    Types: Cigarettes    Quit date: 06/25/2002    Years since quitting: 18.2  . Smokeless tobacco: Never Used  Vaping Use  . Vaping Use: Never used  Substance and Sexual Activity  . Alcohol use: Yes    Comment: rare  . Drug use: No  . Sexual activity: Not Currently    Partners: Male

## 2020-10-05 ENCOUNTER — Telehealth: Payer: Self-pay | Admitting: Orthopaedic Surgery

## 2020-10-05 NOTE — Telephone Encounter (Signed)
Patient called advised she can not come in for an earlier appointment. Patient asked if the results can be giving to her over the phone? The number to contact patient is [phone]

## 2020-10-05 NOTE — Telephone Encounter (Signed)
See message below °

## 2020-10-05 NOTE — Telephone Encounter (Signed)
Sure thing.  We can call her once she has had the MRI to go over the results.

## 2020-10-06 NOTE — Telephone Encounter (Signed)
Patient completed at [facility] on Wednesday September 28, 2020.

## 2020-10-06 NOTE — Telephone Encounter (Signed)
Are you able to see her MRI?

## 2020-10-12 NOTE — Telephone Encounter (Signed)
Left voice mail

## 2020-10-19 ENCOUNTER — Encounter: Payer: Self-pay | Admitting: Orthopaedic Surgery

## 2020-10-19 ENCOUNTER — Other Ambulatory Visit: Payer: Self-pay

## 2020-10-19 ENCOUNTER — Ambulatory Visit (INDEPENDENT_AMBULATORY_CARE_PROVIDER_SITE_OTHER): Payer: No Typology Code available for payment source | Admitting: Orthopaedic Surgery

## 2020-10-19 DIAGNOSIS — G8929 Other chronic pain: Secondary | ICD-10-CM

## 2020-10-19 DIAGNOSIS — M25511 Pain in right shoulder: Secondary | ICD-10-CM

## 2020-10-19 NOTE — Progress Notes (Signed)
Office Visit Note   Patient: Beth Shepard           Date of Birth: [birth year]           MRN: [id number] Visit Date: 10/19/2020              Requested by: [address], [place], [address] [place],  [place] [address] PCP: Carollee Herter, Alferd Apa, DO   Assessment & Plan: Visit Diagnoses:  1. 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  Rupture of the long head of the biceps.  Moderate degenerative changes of the glenohumeral joint.  These findings were reviewed with the patient and my impression is that she has rotator cuff arthropathy and these rotator cuff tears look chronic.  Based on her options she would like to have a consultation with Dr. Marlou Sa about reverse shoulder replacement.  Follow-Up Instructions: Return for needs appt with Dr. Marlou Sa for discussion of right shoulder replacement.   Orders:  No orders of the defined types were placed in this encounter.  No orders of the defined types were placed in this encounter.     Procedures: No procedures performed   Clinical Data: No additional findings.   Subjective: Chief Complaint  Patient presents with  . Right Shoulder - Pain, Follow-up    Beth Shepard is a 65 year old female returning today for MRI review of the right shoulder.  Reports no changes.   Review of Systems   Objective: Vital Signs: There were no vitals taken for this visit.  Physical Exam  Ortho Exam Right shoulder exam is stable. Specialty Comments:  No specialty comments available.  Imaging: No results found.   PMFS History: Patient Active Problem List   Diagnosis Date Noted  . IBS (irritable bowel syndrome) 05/11/2020  . Uncontrolled type 2 diabetes mellitus with hyperglycemia ([place]) 02/01/2020  . Nausea without vomiting 01/27/2019  . Preventative health care 12/18/2017  . Infected sebaceous cyst of skin 07/19/2017  . DCM  (dilated cardiomyopathy) ([place]) 07/10/2017  . Fibromyalgia 04/05/2017  . Chronic pain syndrome 04/05/2017  . Pain in both lower extremities 02/06/2017  . Chronic venous insufficiency 02/06/2017  . Cellulitis of lower extremity 08/28/2016  . Pain in right ankle and joints of right foot 08/09/2016  . Peripheral edema 07/10/2016  . Trochanteric bursitis, right hip 05/03/2016  . Chronic kidney disease (CKD) stage G3a/A1, moderately decreased glomerular filtration rate (GFR) between 45-59 mL/min/1.73 square meter and albuminuria creatinine ratio less than 30 mg/g (HCC) 04/07/2016  . Primary osteoarthritis of right hip 10/24/2015  . Hip joint replacement status 10/24/2015  . Chest pain 08/25/2015  . Right hip pain 08/25/2015  . Chest pain, precordial 08/10/2015  . Precordial pain 08/10/2015  . Cardiomyopathy ([place]) 01/18/2015  . Block, bundle branch, left 01/18/2015  . Left bundle-branch block 01/18/2015  . Diabetes ([place]) 01/18/2015  . Nausea alone 12/17/2013  . Diarrhea 11/19/2013  . Bloating 11/19/2013  . Obesity (BMI 30-39.9) 10/21/2013  . Multiple joint pain 11/13/2012  . MYALGIA 08/29/2010  . ABDOMINAL PAIN OTHER SPECIFIED SITE 08/22/2009  . OSTEOARTHROS UNSPEC WHETHER GEN/LOC UNSPEC SITE 03/17/2009  . Cellulitis and abscess of face 12/17/2008  . Disturbance of skin sensation 12/17/2008  . ACUTE BRONCHITIS 05/07/2008  . Anxiety 12/04/2007  . SINUSITIS, ACUTE NOS 12/19/2006  . DM (diabetes mellitus) type II uncontrolled, periph vascular disorder ([place]) 10/08/2006  . Hyperlipidemia LDL goal <70  10/08/2006  . Essential hypertension 10/08/2006  . COPD (chronic obstructive pulmonary disease) ([place]) 10/08/2006  . GERD 10/08/2006  . Other postprocedural status(V45.89) 10/08/2006   Past Medical History:  Diagnosis Date  . Anxiety   . Bilateral edema of lower extremity    Swelling in legs and ankles  . Congestive heart failure ([place])   . COPD (chronic obstructive pulmonary disease)  ([place])   . Diabetes mellitus    type 2  . Gastric polyps   . GERD (gastroesophageal reflux disease)    chronic  . Hyperlipidemia   . Hypertension   . IBS (irritable bowel syndrome)   . LBBB (left bundle branch block)    Dr. Gwenith Spitz, [facility]  . Pneumonia    hx of  . PONV (postoperative nausea and vomiting)    Had N/V after having tubal ligation and D&C  . Rheumatoid arthritis ([place])   . Shingles     Family History  Problem Relation Age of Onset  . Lymphoma Mother   . Heart disease Mother        chf, cabg x6  . Hypertension Mother   . GER disease Mother   . Hiatal hernia Mother   . Gallstones Mother   . Brain cancer Sister        brain stage 4  . Heart disease Maternal Grandmother 63       MI  . Heart disease Maternal Grandfather   . Stroke Paternal Grandmother   . Hypertension Paternal Grandmother   . Stroke Paternal Grandfather   . Hypertension Paternal Grandfather   . Rheum arthritis Father   . Hypertension Daughter   . Colon cancer Neg Hx   . Esophageal cancer Neg Hx   . Pancreatic cancer Neg Hx   . Liver disease Neg Hx   . Kidney disease Neg Hx     Past Surgical History:  Procedure Laterality Date  . CARDIAC CATHETERIZATION     No PCI  . CHOLECYSTECTOMY    . COLONOSCOPY  06/06/2005   normal   . DILATION AND CURETTAGE OF UTERUS    . ESOPHAGOGASTRODUODENOSCOPY  06/06/2005, 11/17/2013  . JOINT REPLACEMENT Right 10/24/2015   rt hip replacement  . KNEE ARTHROSCOPY Left   . TOTAL HIP ARTHROPLASTY Right 10/24/2015   Procedure: RIGHT TOTAL HIP ARTHROPLASTY ANTERIOR APPROACH;  Surgeon: Leandrew Koyanagi, MD;  Location: [facility];  Service: Orthopedics;  Laterality: Right;  . TUBAL LIGATION    . VAGINAL HYSTERECTOMY     Social History   Occupational History  . Occupation: Administive     Employer: [employer]  Tobacco Use  . Smoking status: Former Smoker    Years: 33.00    Types: Cigarettes    Quit date: 06/25/2002    Years since  quitting: 18.3  . Smokeless tobacco: Never Used  Vaping Use  . Vaping Use: Never used  Substance and Sexual Activity  . Alcohol use: Yes    Comment: rare  . Drug use: No  . Sexual activity: Not Currently    Partners: Male

## 2020-10-31 ENCOUNTER — Ambulatory Visit: Payer: No Typology Code available for payment source | Admitting: Orthopedic Surgery

## 2020-11-04 ENCOUNTER — Other Ambulatory Visit: Payer: Self-pay | Admitting: Family Medicine

## 2020-11-04 DIAGNOSIS — E785 Hyperlipidemia, unspecified: Secondary | ICD-10-CM

## 2020-11-04 DIAGNOSIS — IMO0002 Reserved for concepts with insufficient information to code with codable children: Secondary | ICD-10-CM

## 2020-11-04 DIAGNOSIS — E1151 Type 2 diabetes mellitus with diabetic peripheral angiopathy without gangrene: Secondary | ICD-10-CM

## 2020-11-04 DIAGNOSIS — I10 Essential (primary) hypertension: Secondary | ICD-10-CM

## 2020-11-04 NOTE — Telephone Encounter (Signed)
Requesting: Alprazolam Contract: 04-05-17 UDS: 04-05-17 Last Visit: 01/29/2020 Next Visit: none Last Refill: 08/03/2020

## 2020-11-07 ENCOUNTER — Ambulatory Visit: Payer: No Typology Code available for payment source | Admitting: Orthopedic Surgery

## 2020-11-08 ENCOUNTER — Ambulatory Visit (INDEPENDENT_AMBULATORY_CARE_PROVIDER_SITE_OTHER): Payer: No Typology Code available for payment source | Admitting: Family

## 2020-11-08 ENCOUNTER — Other Ambulatory Visit: Payer: Self-pay

## 2020-11-08 ENCOUNTER — Encounter: Payer: Self-pay | Admitting: Family

## 2020-11-08 ENCOUNTER — Other Ambulatory Visit: Payer: Self-pay | Admitting: Family

## 2020-11-08 VITALS — BP 116/82 | HR 94 | Temp 98.4°F | Resp 18 | Ht 61.0 in | Wt 196.0 lb

## 2020-11-08 DIAGNOSIS — R059 Cough, unspecified: Secondary | ICD-10-CM

## 2020-11-08 DIAGNOSIS — J40 Bronchitis, not specified as acute or chronic: Secondary | ICD-10-CM

## 2020-11-08 DIAGNOSIS — R509 Fever, unspecified: Secondary | ICD-10-CM

## 2020-11-08 LAB — POC INFLUENZA A&B (BINAX/QUICKVUE)
Influenza A, POC: NEGATIVE
Influenza B, POC: NEGATIVE

## 2020-11-08 MED ORDER — ALBUTEROL SULFATE HFA 108 (90 BASE) MCG/ACT IN AERS: 2.0000 | INHALATION_SPRAY | Freq: Four times a day (QID) | RESPIRATORY_TRACT | 0 refills | Status: DC | PRN

## 2020-11-08 MED ORDER — BENZONATATE 100 MG PO CAPS: 100.0000 mg | ORAL_CAPSULE | Freq: Three times a day (TID) | ORAL | 0 refills | Status: DC | PRN

## 2020-11-08 MED ORDER — DOXYCYCLINE HYCLATE 100 MG PO TABS
100.0000 mg | ORAL_TABLET | Freq: Two times a day (BID) | ORAL | 0 refills | Status: DC
Start: 2020-11-08 — End: 2021-07-06

## 2020-11-08 NOTE — Addendum Note (Signed)
Addended by: [name] on: 11/08/2020 05:29 PM   Modules accepted: Orders

## 2020-11-08 NOTE — Progress Notes (Signed)
Beth Shepard is a 65 y.o. female with the following history as recorded in EpicCare:  Patient Active Problem List   Diagnosis Date Noted  . IBS (irritable bowel syndrome) 05/11/2020  . Uncontrolled type 2 diabetes mellitus with hyperglycemia ([place]) 02/01/2020  . Nausea without vomiting 01/27/2019  . Preventative health care 12/18/2017  . Infected sebaceous cyst of skin 07/19/2017  . DCM (dilated cardiomyopathy) ([place]) 07/10/2017  . Fibromyalgia 04/05/2017  . Chronic pain syndrome 04/05/2017  . Pain in both lower extremities 02/06/2017  . Chronic venous insufficiency 02/06/2017  . Cellulitis of lower extremity 08/28/2016  . Pain in right ankle and joints of right foot 08/09/2016  . Peripheral edema 07/10/2016  . Trochanteric bursitis, right hip 05/03/2016  . Chronic kidney disease (CKD) stage G3a/A1, moderately decreased glomerular filtration rate (GFR) between 45-59 mL/min/1.73 square meter and albuminuria creatinine ratio less than 30 mg/g (HCC) 04/07/2016  . Primary osteoarthritis of right hip 10/24/2015  . Hip joint replacement status 10/24/2015  . Chest pain 08/25/2015  . Right hip pain 08/25/2015  . Chest pain, precordial 08/10/2015  . Precordial pain 08/10/2015  . Cardiomyopathy ([place]) 01/18/2015  . Block, bundle branch, left 01/18/2015  . Left bundle-branch block 01/18/2015  . Diabetes ([place]) 01/18/2015  . Nausea alone 12/17/2013  . Diarrhea 11/19/2013  . Bloating 11/19/2013  . Obesity (BMI 30-39.9) 10/21/2013  . Multiple joint pain 11/13/2012  . MYALGIA 08/29/2010  . ABDOMINAL PAIN OTHER SPECIFIED SITE 08/22/2009  . OSTEOARTHROS UNSPEC WHETHER GEN/LOC UNSPEC SITE 03/17/2009  . Cellulitis and abscess of face 12/17/2008  . Disturbance of skin sensation 12/17/2008  . ACUTE BRONCHITIS 05/07/2008  . Anxiety 12/04/2007  . SINUSITIS, ACUTE NOS 12/19/2006  . DM (diabetes mellitus) type II uncontrolled, periph vascular disorder ([place]) 10/08/2006  . Hyperlipidemia LDL goal <70  10/08/2006  . Essential hypertension 10/08/2006  . COPD (chronic obstructive pulmonary disease) ([place]) 10/08/2006  . GERD 10/08/2006  . Other postprocedural status(V45.89) 10/08/2006    Current Outpatient Medications  Medication Sig Dispense Refill  . benzonatate (TESSALON) 100 MG capsule Take 1 capsule (100 mg total) by mouth 3 (three) times daily as needed. 20 capsule 0  . doxycycline (VIBRA-TABS) 100 MG tablet Take 1 tablet (100 mg total) by mouth 2 (two) times daily. 14 tablet 0  . albuterol (PROVENTIL HFA) 108 (90 Base) MCG/ACT inhaler Inhale 2 puffs into the lungs every 6 (six) hours as needed for wheezing. 18 g 0  . ALPRAZolam (XANAX) 0.25 MG tablet TAKE 1 TABLET BY MOUTH THREE TIMES A DAY AS NEEDED 90 tablet 0  . aspirin 81 MG tablet Take 81 mg by mouth daily.    . Calcium Carb-Cholecalciferol (CALCIUM-VITAMIN D) 500-200 MG-UNIT tablet Take by mouth.    . carvedilol (COREG) 12.5 MG tablet Take 1 tablet (12.5 mg total) by mouth 2 (two) times daily. 180 tablet 1  . dicyclomine (BENTYL) 10 MG capsule Take 1 capsule (10 mg total) by mouth 4 (four) times daily -  before meals and at bedtime. 360 capsule 1  . furosemide (LASIX) 20 MG tablet Take 1 tablet (20 mg total) by mouth daily. 90 tablet 1  . gabapentin (NEURONTIN) 100 MG capsule TAKE 1 CAPSULE BY MOUTH THREE TIMES A DAY 270 capsule 0  . glimepiride (AMARYL) 4 MG tablet Take 1 tablet (4 mg total) by mouth daily before breakfast. 90 tablet 0  . glucose blood (ONE TOUCH TEST STRIPS) test strip 1 each by Other route as needed. Reported on 11/02/2015    .  glycopyrrolate (ROBINUL) 2 MG tablet Take 1 tablet (2 mg total) by mouth 2 (two) times daily. FOR IBS 60 tablet 11  . hyoscyamine (LEVSIN SL) 0.125 MG SL tablet Place 1 tablet (0.125 mg total) under the tongue every 4 (four) hours as needed for cramping. 30 tablet 6  . lisinopril (ZESTRIL) 20 MG tablet Take 1 tablet (20 mg total) by mouth daily. 90 tablet 0  . omega-3 acid ethyl esters  (LOVAZA) 1 g capsule Take 2 g by mouth. (Patient not taking: Reported on 05/11/2020)    . ondansetron (ZOFRAN) 4 MG tablet TAKE 1-2 TABLETS BY MOUTH EVERY 8 HOURS AS NEEDED FOR NAUSEA 40 tablet 1  . pantoprazole (PROTONIX) 40 MG tablet Take 1 tablet (40 mg total) by mouth in the morning. 30 tablet 11  . rosuvastatin (CRESTOR) 10 MG tablet Take 1 tablet (10 mg total) by mouth at bedtime. 90 tablet 1  . spironolactone (ALDACTONE) 25 MG tablet Take 1 tablet (25 mg total) by mouth every other day. Dr Ola Spurr    . traMADol (ULTRAM) 50 MG tablet Take 1 tablet (50 mg total) by mouth 3 (three) times daily as needed. 30 tablet 0   No current facility-administered medications for this visit.    Allergies: Prednisone, Codeine, and Ibuprofen  Past Medical History:  Diagnosis Date  . Anxiety   . Bilateral edema of lower extremity    Swelling in legs and ankles  . Congestive heart failure ([place])   . COPD (chronic obstructive pulmonary disease) ([place])   . Diabetes mellitus    type 2  . Gastric polyps   . GERD (gastroesophageal reflux disease)    chronic  . Hyperlipidemia   . Hypertension   . IBS (irritable bowel syndrome)   . LBBB (left bundle branch block)    Dr. Gwenith Spitz, [facility]  . Pneumonia    hx of  . PONV (postoperative nausea and vomiting)    Had N/V after having tubal ligation and D&C  . Rheumatoid arthritis ([place])   . Shingles     Past Surgical History:  Procedure Laterality Date  . CARDIAC CATHETERIZATION     No PCI  . CHOLECYSTECTOMY    . COLONOSCOPY  06/06/2005   normal   . DILATION AND CURETTAGE OF UTERUS    . ESOPHAGOGASTRODUODENOSCOPY  06/06/2005, 11/17/2013  . JOINT REPLACEMENT Right 10/24/2015   rt hip replacement  . KNEE ARTHROSCOPY Left   . TOTAL HIP ARTHROPLASTY Right 10/24/2015   Procedure: RIGHT TOTAL HIP ARTHROPLASTY ANTERIOR APPROACH;  Surgeon: Leandrew Koyanagi, MD;  Location: [facility];  Service: Orthopedics;  Laterality: Right;  . TUBAL LIGATION    .  VAGINAL HYSTERECTOMY      Family History  Problem Relation Age of Onset  . Lymphoma Mother   . Heart disease Mother        chf, cabg x6  . Hypertension Mother   . GER disease Mother   . Hiatal hernia Mother   . Gallstones Mother   . Brain cancer Sister        brain stage 4  . Heart disease Maternal Grandmother 58       MI  . Heart disease Maternal Grandfather   . Stroke Paternal Grandmother   . Hypertension Paternal Grandmother   . Stroke Paternal Grandfather   . Hypertension Paternal Grandfather   . Rheum arthritis Father   . Hypertension Daughter   . Colon cancer Neg Hx   . Esophageal cancer Neg Hx   .  Pancreatic cancer Neg Hx   . Liver disease Neg Hx   . Kidney disease Neg Hx     Social History   Tobacco Use  . Smoking status: Former Smoker    Years: 33.00    Types: Cigarettes    Quit date: 06/25/2002    Years since quitting: 18.3  . Smokeless tobacco: Never Used  Substance Use Topics  . Alcohol use: Yes    Comment: rare    Subjective:   Cough/ congestion started last week; history of COPD and recurrent pneumonia; home COVID test yesterday was negative but she is requesting PCR testing today and flu testing; notes that her temperature was very high on Saturday evening at 103/ was at 101 on Sunday and Monday evenings; using medication to help keep temperature down;    Objective:  Vitals:   11/08/20 1544  BP: 116/82  Pulse: 94  Resp: 18  Temp: 98.4 F (36.9 C)  TempSrc: Oral  SpO2: 97%  Weight: 196 lb (88.9 kg)  Height: 5\' 1"  (1.549 m)    General: Well developed, well nourished, in no acute distress  Skin : Warm and dry.  Head: Normocephalic and atraumatic  Eyes: Sclera and conjunctiva clear; pupils round and reactive to light; extraocular movements intact  Ears: External normal; canals clear; tympanic membranes normal  Oropharynx: Pink, supple. No suspicious lesions  Neck: Supple without thyromegaly, adenopathy  Lungs: Respirations unlabored; clear to  auscultation bilaterally without wheeze, rales, rhonchi  CVS exam: normal rate and regular rhythm.  Neurologic: Alert and oriented; speech intact; face symmetrical; moves all extremities well; CNII-XII intact without focal deficit   Assessment:  1. Cough   2. Fever, unspecified fever cause   3. Bronchitis     Plan:  Will hold CXR until PCR COVID test results are back; assuming send out COVID is negative and symptoms are persisting, will consider CXR at that time; in the interim, will start Doxycyline 100 mg bid x 7 days and refill albuterol and Tessalon Perles; work note given for remainder of the week; follow up to be determined;   This visit occurred during the SARS-CoV-2 public health emergency.  Safety protocols were in place, including screening questions prior to the visit, additional usage of staff PPE, and extensive cleaning of exam room while observing appropriate contact time as indicated for disinfecting solutions.     No follow-ups on file.  Orders Placed This Encounter  Procedures  . Novel Coronavirus, NAA ([facility])    Order Specific Question:   Is this test for diagnosis or screening    Answer:   Diagnosis of ill patient    Order Specific Question:   Symptomatic for COVID-19 as defined by CDC    Answer:   Yes    Order Specific Question:   Date of Symptom Onset    Answer:   11/06/2020    Order Specific Question:   Hospitalized for COVID-19    Answer:   No    Order Specific Question:   Admitted to ICU for COVID-19    Answer:   No    Order Specific Question:   Previously tested for COVID-19    Answer:   Yes    Order Specific Question:   Resident in a congregate (group) care setting    Answer:   Unknown    Order Specific Question:   Is the patient student?    Answer:   No    Order Specific Question:   Employed in healthcare  setting    Answer:   No    Order Specific Question:   Pregnant    Answer:   No    Order Specific Question:   Has patient completed COVID  vaccination(s) (2 doses of Pfizer/Moderna 1 dose of [employer])    Answer:   Unknown  . POC Influenza A&B (Binax test)    Requested Prescriptions   Signed Prescriptions Disp Refills  . albuterol (PROVENTIL HFA) 108 (90 Base) MCG/ACT inhaler 18 g 0    Sig: Inhale 2 puffs into the lungs every 6 (six) hours as needed for wheezing.  Marland Kitchen doxycycline (VIBRA-TABS) 100 MG tablet 14 tablet 0    Sig: Take 1 tablet (100 mg total) by mouth 2 (two) times daily.  . benzonatate (TESSALON) 100 MG capsule 20 capsule 0    Sig: Take 1 capsule (100 mg total) by mouth 3 (three) times daily as needed.

## 2020-11-10 LAB — SARS-COV-2, NAA 2 DAY TAT

## 2020-11-10 LAB — NOVEL CORONAVIRUS, NAA: SARS-CoV-2, NAA: NOT DETECTED

## 2020-11-11 ENCOUNTER — Ambulatory Visit (HOSPITAL_BASED_OUTPATIENT_CLINIC_OR_DEPARTMENT_OTHER)
Admission: RE | Admit: 2020-11-11 | Discharge: 2020-11-11 | Disposition: A | Discharge status: Home / Self Care | Payer: No Typology Code available for payment source | Source: Ambulatory Visit | Attending: Family | Admitting: Family

## 2020-11-11 ENCOUNTER — Other Ambulatory Visit: Payer: Self-pay

## 2020-11-11 ENCOUNTER — Other Ambulatory Visit: Payer: Self-pay | Admitting: Family

## 2020-11-11 DIAGNOSIS — R059 Cough, unspecified: Secondary | ICD-10-CM | POA: Insufficient documentation

## 2020-11-23 ENCOUNTER — Ambulatory Visit: Payer: No Typology Code available for payment source | Admitting: Orthopedic Surgery

## 2020-12-05 ENCOUNTER — Other Ambulatory Visit: Payer: Self-pay | Admitting: Family

## 2020-12-05 DIAGNOSIS — J40 Bronchitis, not specified as acute or chronic: Secondary | ICD-10-CM

## 2020-12-14 ENCOUNTER — Other Ambulatory Visit: Payer: Self-pay | Admitting: Family Medicine

## 2020-12-14 DIAGNOSIS — IMO0002 Reserved for concepts with insufficient information to code with codable children: Secondary | ICD-10-CM

## 2020-12-14 DIAGNOSIS — E785 Hyperlipidemia, unspecified: Secondary | ICD-10-CM

## 2020-12-14 DIAGNOSIS — E1151 Type 2 diabetes mellitus with diabetic peripheral angiopathy without gangrene: Secondary | ICD-10-CM

## 2020-12-14 DIAGNOSIS — I10 Essential (primary) hypertension: Secondary | ICD-10-CM

## 2021-01-21 ENCOUNTER — Other Ambulatory Visit: Payer: Self-pay | Admitting: Family Medicine

## 2021-01-21 DIAGNOSIS — E785 Hyperlipidemia, unspecified: Secondary | ICD-10-CM

## 2021-01-21 DIAGNOSIS — E1151 Type 2 diabetes mellitus with diabetic peripheral angiopathy without gangrene: Secondary | ICD-10-CM

## 2021-01-21 DIAGNOSIS — IMO0002 Reserved for concepts with insufficient information to code with codable children: Secondary | ICD-10-CM

## 2021-01-21 DIAGNOSIS — I10 Essential (primary) hypertension: Secondary | ICD-10-CM

## 2021-01-23 ENCOUNTER — Other Ambulatory Visit: Payer: Self-pay | Admitting: Family Medicine

## 2021-01-23 DIAGNOSIS — E785 Hyperlipidemia, unspecified: Secondary | ICD-10-CM

## 2021-01-23 DIAGNOSIS — IMO0002 Reserved for concepts with insufficient information to code with codable children: Secondary | ICD-10-CM

## 2021-01-23 DIAGNOSIS — E1165 Type 2 diabetes mellitus with hyperglycemia: Secondary | ICD-10-CM

## 2021-01-23 DIAGNOSIS — E1151 Type 2 diabetes mellitus with diabetic peripheral angiopathy without gangrene: Secondary | ICD-10-CM

## 2021-01-23 DIAGNOSIS — I10 Essential (primary) hypertension: Secondary | ICD-10-CM

## 2021-01-23 NOTE — Telephone Encounter (Signed)
Requesting: Xanax Contract: 2018 UDS: 2018  Last OV: 11/08/20 w/ Mickel Baas Next OV: N/A Last Refill: 11/04/20, #90--0 RF Database:   Please advise

## 2021-05-17 ENCOUNTER — Other Ambulatory Visit: Payer: Self-pay | Admitting: Family Medicine

## 2021-05-17 ENCOUNTER — Other Ambulatory Visit: Payer: Self-pay | Admitting: Physician Assistant

## 2021-05-17 DIAGNOSIS — F419 Anxiety disorder, unspecified: Secondary | ICD-10-CM

## 2021-05-17 DIAGNOSIS — I10 Essential (primary) hypertension: Secondary | ICD-10-CM

## 2021-05-17 DIAGNOSIS — E785 Hyperlipidemia, unspecified: Secondary | ICD-10-CM

## 2021-05-17 NOTE — Telephone Encounter (Signed)
Pt was requesting just a couple pills until she can get in to see pcp. Tried to schedule her an appt but she stated she has to look at her work schedule first.         ALPRAZolam (XANAX) 0.25 MG tablet               [id number] - [place], [place] - [address]  [address], [place] [address]  Phone:  [phone]  Fax:  [phone]

## 2021-05-22 MED ORDER — ALPRAZOLAM 0.25 MG PO TABS: 0.2500 mg | ORAL_TABLET | Freq: Three times a day (TID) | ORAL | 0 refills | Status: DC | PRN

## 2021-05-22 NOTE — Telephone Encounter (Signed)
Requesting: Xanax Contract:  UDS: Last OV: 01/29/2020 Next OV: 05/25/21 Last Refill: 01/23/2021, #90--0 RF Database:   Please advise

## 2021-05-22 NOTE — Telephone Encounter (Signed)
Pt called on 11/23 to request medication to be refilled and is currently following up. Please advise.

## 2021-05-25 ENCOUNTER — Telehealth (INDEPENDENT_AMBULATORY_CARE_PROVIDER_SITE_OTHER): Payer: No Typology Code available for payment source | Admitting: Family Medicine

## 2021-05-25 ENCOUNTER — Encounter: Payer: Self-pay | Admitting: Family Medicine

## 2021-05-25 VITALS — BP 115/70 | HR 85 | Wt 182.0 lb

## 2021-05-25 DIAGNOSIS — R197 Diarrhea, unspecified: Secondary | ICD-10-CM

## 2021-05-25 DIAGNOSIS — H6503 Acute serous otitis media, bilateral: Secondary | ICD-10-CM

## 2021-05-25 DIAGNOSIS — J40 Bronchitis, not specified as acute or chronic: Secondary | ICD-10-CM

## 2021-05-25 MED ORDER — DOXYCYCLINE HYCLATE 100 MG PO TABS: 100.0000 mg | ORAL_TABLET | Freq: Two times a day (BID) | ORAL | 0 refills | Status: DC

## 2021-05-25 MED ORDER — FLUTICASONE PROPIONATE 50 MCG/ACT NA SUSP: 2.0000 | Freq: Every day | NASAL | 6 refills | Status: DC

## 2021-05-25 MED ORDER — BENZONATATE 200 MG PO CAPS
200.0000 mg | ORAL_CAPSULE | Freq: Two times a day (BID) | ORAL | 0 refills | Status: DC | PRN
Start: 2021-05-25 — End: 2021-09-08

## 2021-05-25 NOTE — Progress Notes (Addendum)
Virtual telephone visit    Virtual Visit via Telephone Note   This visit type was conducted due to national recommendations for restrictions regarding rthe COVID-19 Pandemic (e.g. social distancing) in an effort to limit this patient's exposure and mitigate transmission in our community. Due to her co-morbid illnesses, this patient is at least at moderate risk for complications without adequate follow up. This format is felt to be most appropriate for this patient at this time. The patient did not have access to video technology or had technical difficulties with video requiring transitioning to audio format only (telephone). Physical exam was limited to content and character of the telephone Ceres was able to get the patient set up on a telephone visit.   Patient location: home alone Patient and provider in visit Provider location: Office  I discussed the limitations of evaluation and management by telemedicine and the availability of in person appointments. The patient expressed understanding and agreed to proceed.   Visit Date: 05/25/2021  Today's healthcare provider: Ann Held, DO     Subjective:    Patient ID: Beth Shepard, female    DOB: [birth year], 65 y.o.   MRN: [id number]  Chief Complaint  Patient presents with   Cough   Shortness of Breath   Fatigue   Diarrhea    HPI Patient is in today for cough, sob , fatigue and diarrhea x3 weeks    2 weeks ago she had abd cramps and diarrhea .  She thought it was her ibs and it got better over the weekend.   Last week she went to work with a scratchy throat  tues/ wed started with cough sob , diarrhea , weak.  And fever 99- 102    no fevers this week .   Pt took 2 covid test that were neg  Pt does have a productive cough.   Past Medical History:  Diagnosis Date   Anxiety    Bilateral edema of lower extremity    Swelling in legs and ankles   Congestive heart failure (HCC)    COPD (chronic  obstructive pulmonary disease) (HCC)    Diabetes mellitus    type 2   Gastric polyps    GERD (gastroesophageal reflux disease)    chronic   Hyperlipidemia    Hypertension    IBS (irritable bowel syndrome)    LBBB (left bundle branch block)    Dr. Gwenith Spitz, [facility]   Pneumonia    hx of   PONV (postoperative nausea and vomiting)    Had N/V after having tubal ligation and D&C   Rheumatoid arthritis ([place])    Shingles     Past Surgical History:  Procedure Laterality Date   CARDIAC CATHETERIZATION     No PCI   CHOLECYSTECTOMY     COLONOSCOPY  06/06/2005   normal    DILATION AND CURETTAGE OF UTERUS     ESOPHAGOGASTRODUODENOSCOPY  06/06/2005, 11/17/2013   JOINT REPLACEMENT Right 10/24/2015   rt hip replacement   KNEE ARTHROSCOPY Left    TOTAL HIP ARTHROPLASTY Right 10/24/2015   Procedure: RIGHT TOTAL HIP ARTHROPLASTY ANTERIOR APPROACH;  Surgeon: Leandrew Koyanagi, MD;  Location: [place];  Service: Orthopedics;  Laterality: Right;   TUBAL LIGATION     VAGINAL HYSTERECTOMY      Family History  Problem Relation Age of Onset   Lymphoma Mother    Heart disease Mother        Duanne Limerick,  cabg x6   Hypertension Mother    GER disease Mother    Hiatal hernia Mother    Gallstones Mother    Brain cancer Sister        brain stage 4   Heart disease Maternal Grandmother 41       MI   Heart disease Maternal Grandfather    Stroke Paternal Grandmother    Hypertension Paternal Grandmother    Stroke Paternal Grandfather    Hypertension Paternal Grandfather    Rheum arthritis Father    Hypertension Daughter    Colon cancer Neg Hx    Esophageal cancer Neg Hx    Pancreatic cancer Neg Hx    Liver disease Neg Hx    Kidney disease Neg Hx     Social History   Socioeconomic History   Marital status: Widowed    Spouse name: Not on file   Number of children: 2   Years of education: Not on file   Highest education level: Not on file  Occupational History   Occupation: Administive      Employer: [employer]  Tobacco Use   Smoking status: Former    Years: 33.00    Types: Cigarettes    Quit date: 06/25/2002    Years since quitting: 19.0   Smokeless tobacco: Never  Vaping Use   Vaping Use: Never used  Substance and Sexual Activity   Alcohol use: Yes    Comment: rare   Drug use: No   Sexual activity: Not Currently    Partners: Male  Other Topics Concern   Not on file  Social History Narrative   Limited exercise per cardiology   Social Determinants of Health   Financial Resource Strain: Not on file  Food Insecurity: Not on file  Transportation Needs: Not on file  Physical Activity: Not on file  Stress: Not on file  Social Connections: Not on file  Intimate Partner Violence: Not on file    Outpatient Medications Prior to Visit  Medication Sig Dispense Refill   ALPRAZolam (XANAX) 0.25 MG tablet Take 1 tablet (0.25 mg total) by mouth 3 (three) times daily as needed. 90 tablet 0   aspirin 81 MG tablet Take 81 mg by mouth daily.     Calcium Carb-Cholecalciferol (CALCIUM-VITAMIN D) 500-200 MG-UNIT tablet Take by mouth.     carvedilol (COREG) 12.5 MG tablet Take 1 tablet (12.5 mg total) by mouth 2 (two) times daily. 180 tablet 1   dicyclomine (BENTYL) 10 MG capsule Take 1 capsule (10 mg total) by mouth 4 (four) times daily -  before meals and at bedtime. 360 capsule 1   doxycycline (VIBRA-TABS) 100 MG tablet Take 1 tablet (100 mg total) by mouth 2 (two) times daily. 14 tablet 0   gabapentin (NEURONTIN) 100 MG capsule TAKE 1 CAPSULE BY MOUTH THREE TIMES A DAY 270 capsule 0   glimepiride (AMARYL) 4 MG tablet TAKE 1 TABLET BY MOUTH EVERY DAY BEFORE BREAKFAST 90 tablet 0   glucose blood test strip 1 each by Other route as needed. Reported on 11/02/2015     glycopyrrolate (ROBINUL) 2 MG tablet Take 1 tablet (2 mg total) by mouth 2 (two) times daily. FOR IBS 60 tablet 11   hyoscyamine (LEVSIN SL) 0.125 MG SL tablet Place 1 tablet (0.125 mg total) under the  tongue every 4 (four) hours as needed for cramping. 30 tablet 6   lisinopril (ZESTRIL) 20 MG tablet TAKE 1 TABLET BY MOUTH EVERY DAY 90 tablet 0  omega-3 acid ethyl esters (LOVAZA) 1 g capsule Take 2 g by mouth.     ondansetron (ZOFRAN) 4 MG tablet TAKE 1-2 TABLETS BY MOUTH EVERY 8 HOURS AS NEEDED FOR NAUSEA 40 tablet 1   pantoprazole (PROTONIX) 40 MG tablet Take 1 tablet (40 mg total) by mouth in the morning. ** CALL OFFICE TO SCHEDULE APPOINTMENT 90 tablet 0   PROAIR HFA 108 (90 Base) MCG/ACT inhaler INHALE 2 PUFFS INTO THE LUNGS EVERY 6 HOURS AS NEEDED FOR WHEEZE 8.5 each 0   rosuvastatin (CRESTOR) 10 MG tablet Take 1 tablet (10 mg total) by mouth at bedtime. 90 tablet 1   traMADol (ULTRAM) 50 MG tablet Take 1 tablet (50 mg total) by mouth 3 (three) times daily as needed. 30 tablet 0   furosemide (LASIX) 20 MG tablet TAKE 1 TABLET BY MOUTH EVERY DAY 90 tablet 0   benzonatate (TESSALON) 100 MG capsule Take 1 capsule (100 mg total) by mouth 3 (three) times daily as needed. 20 capsule 0   No facility-administered medications prior to visit.    Allergies  Allergen Reactions   Prednisone Palpitations and Other (See Comments)    Dose pack causes vomiting; Causes Gerd N/v/d N/v/d    Codeine Nausea Only and Palpitations    REACTION: palpitation  REACTION: palpitation     Ibuprofen Other (See Comments) and Rash    REACTION: questionable. Pt states that she can take this sometimes. GERD    Review of Systems  Constitutional:  Negative for fever and malaise/fatigue.  HENT:  Positive for congestion and ear pain.   Eyes:  Negative for blurred vision.  Respiratory:  Positive for cough. Negative for shortness of breath.   Cardiovascular:  Negative for chest pain, palpitations and leg swelling.  Gastrointestinal:  Positive for diarrhea and vomiting. Negative for abdominal pain, blood in stool and nausea.  Genitourinary:  Negative for dysuria and frequency.  Musculoskeletal:  Negative for  falls.  Skin:  Negative for rash.  Neurological:  Negative for dizziness, loss of consciousness and headaches.  Endo/Heme/Allergies:  Negative for environmental allergies.  Psychiatric/Behavioral:  Negative for depression. The patient is not nervous/anxious.       Objective:    Physical Exam Vitals and nursing note reviewed.  Constitutional:      Appearance: She is well-developed.  Eyes:     Conjunctiva/sclera: Conjunctivae normal.  Neck:     Vascular: No carotid bruit.  Pulmonary:     Effort: Pulmonary effort is normal. No respiratory distress.  Neurological:     Mental Status: She is alert and oriented to person, place, and time.    BP 115/70   Pulse 85   Wt 182 lb (82.6 kg)   BMI 34.39 kg/m  Wt Readings from Last 3 Encounters:  05/25/21 182 lb (82.6 kg)  11/08/20 196 lb (88.9 kg)  05/11/20 200 lb (90.7 kg)    Diabetic Foot Exam - Simple   No data filed    Lab Results  Component Value Date   WBC 7.8 01/29/2020   HGB 10.9 (L) 01/29/2020   HCT 33.6 (L) 01/29/2020   PLT 273.0 01/29/2020   GLUCOSE 155 (H) 01/29/2020   CHOL 196 01/29/2020   TRIG 256.0 (H) 01/29/2020   HDL 33.40 (L) 01/29/2020   LDLDIRECT 108.0 01/29/2020   LDLCALC 115 (H) 04/06/2016   ALT 11 01/29/2020   AST 14 01/29/2020   NA 137 01/29/2020   K 4.5 01/29/2020   CL 106 01/29/2020   CREATININE  1.50 (H) 01/29/2020   BUN 31 (H) 01/29/2020   CO2 26 01/29/2020   TSH 2.63 10/01/2018   INR 0.99 10/14/2015   HGBA1C 9.4 (H) 01/29/2020   MICROALBUR 1.3 01/29/2020    Lab Results  Component Value Date   TSH 2.63 10/01/2018   Lab Results  Component Value Date   WBC 7.8 01/29/2020   HGB 10.9 (L) 01/29/2020   HCT 33.6 (L) 01/29/2020   MCV 87.0 01/29/2020   PLT 273.0 01/29/2020   Lab Results  Component Value Date   NA 137 01/29/2020   K 4.5 01/29/2020   CO2 26 01/29/2020   GLUCOSE 155 (H) 01/29/2020   BUN 31 (H) 01/29/2020   CREATININE 1.50 (H) 01/29/2020   BILITOT 0.4 01/29/2020    ALKPHOS 80 01/29/2020   AST 14 01/29/2020   ALT 11 01/29/2020   PROT 7.3 01/29/2020   ALBUMIN 4.1 01/29/2020   CALCIUM 9.5 01/29/2020   ANIONGAP 8 10/25/2015   GFR 34.69 (L) 01/29/2020   Lab Results  Component Value Date   CHOL 196 01/29/2020   Lab Results  Component Value Date   HDL 33.40 (L) 01/29/2020   Lab Results  Component Value Date   LDLCALC 115 (H) 04/06/2016   Lab Results  Component Value Date   TRIG 256.0 (H) 01/29/2020   Lab Results  Component Value Date   CHOLHDL 6 01/29/2020   Lab Results  Component Value Date   HGBA1C 9.4 (H) 01/29/2020       Assessment & Plan:   Problem List Items Addressed This Visit       Unprioritized   Bronchitis - Primary    abx per orders If symptoms do not improve go to ER F/u here 1-2 weeks       Relevant Medications   doxycycline (VIBRA-TABS) 100 MG tablet   benzonatate (TESSALON) 200 MG capsule   Diarrhea    Resolved       Other Visit Diagnoses     Non-recurrent acute serous otitis media of both ears       Relevant Medications   doxycycline (VIBRA-TABS) 100 MG tablet   fluticasone (FLONASE) 50 MCG/ACT nasal spray     Time of visit 20 min   I have discontinued Beth Riviello "Vicki"'s benzonatate. I am also having her start on doxycycline, benzonatate, and fluticasone. Additionally, I am having her maintain her glucose blood, aspirin, carvedilol, calcium-vitamin D, omega-3 acid ethyl esters, gabapentin, dicyclomine, hyoscyamine, ondansetron, glycopyrrolate, rosuvastatin, traMADol, doxycycline, ProAir HFA, glimepiride, lisinopril, pantoprazole, and ALPRAZolam.  Meds ordered this encounter  Medications   doxycycline (VIBRA-TABS) 100 MG tablet    Sig: Take 1 tablet (100 mg total) by mouth 2 (two) times daily.    Dispense:  20 tablet    Refill:  0   benzonatate (TESSALON) 200 MG capsule    Sig: Take 1 capsule (200 mg total) by mouth 2 (two) times daily as needed for cough.    Dispense:  20 capsule     Refill:  0   fluticasone (FLONASE) 50 MCG/ACT nasal spray    Sig: Place 2 sprays into both nostrils daily.    Dispense:  16 g    Refill:  6     I discussed the assessment and treatment plan with the patient. The patient was provided an opportunity to ask questions and all were answered. The patient agreed with the plan and demonstrated an understanding of the instructions.   The patient was advised to call back  or seek an in-person evaluation if the symptoms worsen or if the condition fails to improve as anticipated.     Ann Held, DO [facility] [phone] (phone) [phone] (fax)  [place]

## 2021-05-25 NOTE — Assessment & Plan Note (Signed)
Resolved

## 2021-05-25 NOTE — Assessment & Plan Note (Signed)
abx per orders If symptoms do not improve go to ER F/u here 1-2 weeks

## 2021-05-29 ENCOUNTER — Ambulatory Visit (HOSPITAL_BASED_OUTPATIENT_CLINIC_OR_DEPARTMENT_OTHER)
Admission: RE | Admit: 2021-05-29 | Discharge: 2021-05-29 | Disposition: A | Discharge status: Home / Self Care | Payer: No Typology Code available for payment source | Source: Ambulatory Visit | Attending: Family Medicine | Admitting: Family Medicine

## 2021-05-29 ENCOUNTER — Other Ambulatory Visit: Payer: Self-pay

## 2021-05-29 ENCOUNTER — Ambulatory Visit: Payer: No Typology Code available for payment source | Admitting: Family Medicine

## 2021-05-29 ENCOUNTER — Other Ambulatory Visit: Payer: Self-pay | Admitting: Family Medicine

## 2021-05-29 DIAGNOSIS — J189 Pneumonia, unspecified organism: Secondary | ICD-10-CM

## 2021-06-05 ENCOUNTER — Ambulatory Visit: Payer: No Typology Code available for payment source | Admitting: Family Medicine

## 2021-06-05 NOTE — Progress Notes (Incomplete)
Subjective:   By signing my name below, I, [name], attest that this documentation has been prepared under the direction and in the presence of Ann Held, DO. 06/05/2021   Patient ID: Beth Shepard, female    DOB: 19-Aug-1955, 65 y.o.   MRN: [id number]  No chief complaint on file.   HPI Patient is in today for an office visit.  Past Medical History:  Diagnosis Date   Anxiety    Bilateral edema of lower extremity    Swelling in legs and ankles   Congestive heart failure (HCC)    COPD (chronic obstructive pulmonary disease) (HCC)    Diabetes mellitus    type 2   Gastric polyps    GERD (gastroesophageal reflux disease)    chronic   Hyperlipidemia    Hypertension    IBS (irritable bowel syndrome)    LBBB (left bundle branch block)    Dr. Gwenith Spitz, [facility]   Pneumonia    hx of   PONV (postoperative nausea and vomiting)    Had N/V after having tubal ligation and D&C   Rheumatoid arthritis ([place])    Shingles     Past Surgical History:  Procedure Laterality Date   CARDIAC CATHETERIZATION     No PCI   CHOLECYSTECTOMY     COLONOSCOPY  06/06/2005   normal    DILATION AND CURETTAGE OF UTERUS     ESOPHAGOGASTRODUODENOSCOPY  06/06/2005, 11/17/2013   JOINT REPLACEMENT Right 10/24/2015   rt hip replacement   KNEE ARTHROSCOPY Left    TOTAL HIP ARTHROPLASTY Right 10/24/2015   Procedure: RIGHT TOTAL HIP ARTHROPLASTY ANTERIOR APPROACH;  Surgeon: Leandrew Koyanagi, MD;  Location: [place] [facility];  Service: Orthopedics;  Laterality: Right;   TUBAL LIGATION     VAGINAL HYSTERECTOMY      Family History  Problem Relation Age of Onset   Lymphoma Mother    Heart disease Mother        chf, cabg x6   Hypertension Mother    GER disease Mother    Hiatal hernia Mother    Gallstones Mother    Brain cancer Sister        brain stage 4   Heart disease Maternal Grandmother 22       MI   Heart disease Maternal Grandfather    Stroke Paternal Grandmother    Hypertension  Paternal Grandmother    Stroke Paternal Grandfather    Hypertension Paternal Grandfather    Rheum arthritis Father    Hypertension Daughter    Colon cancer Neg Hx    Esophageal cancer Neg Hx    Pancreatic cancer Neg Hx    Liver disease Neg Hx    Kidney disease Neg Hx     Social History   Socioeconomic History   Marital status: Widowed    Spouse name: Not on file   Number of children: 2   Years of education: Not on file   Highest education level: Not on file  Occupational History   Occupation: Administive     Employer: [employer]  Tobacco Use   Smoking status: Former    Years: 33.00    Types: Cigarettes    Quit date: 06/25/2002    Years since quitting: 18.9   Smokeless tobacco: Never  Vaping Use   Vaping Use: Never used  Substance and Sexual Activity   Alcohol use: Yes    Comment: rare   Drug use: No   Sexual activity: Not Currently  Partners: Male  Other Topics Concern   Not on file  Social History Narrative   Limited exercise per cardiology   Social Determinants of Health   Financial Resource Strain: Not on file  Food Insecurity: Not on file  Transportation Needs: Not on file  Physical Activity: Not on file  Stress: Not on file  Social Connections: Not on file  Intimate Partner Violence: Not on file    Outpatient Medications Prior to Visit  Medication Sig Dispense Refill   ALPRAZolam (XANAX) 0.25 MG tablet Take 1 tablet (0.25 mg total) by mouth 3 (three) times daily as needed. 90 tablet 0   aspirin 81 MG tablet Take 81 mg by mouth daily.     benzonatate (TESSALON) 200 MG capsule Take 1 capsule (200 mg total) by mouth 2 (two) times daily as needed for cough. 20 capsule 0   Calcium Carb-Cholecalciferol (CALCIUM-VITAMIN D) 500-200 MG-UNIT tablet Take by mouth.     carvedilol (COREG) 12.5 MG tablet Take 1 tablet (12.5 mg total) by mouth 2 (two) times daily. 180 tablet 1   dicyclomine (BENTYL) 10 MG capsule Take 1 capsule (10 mg total) by mouth 4  (four) times daily -  before meals and at bedtime. 360 capsule 1   doxycycline (VIBRA-TABS) 100 MG tablet Take 1 tablet (100 mg total) by mouth 2 (two) times daily. 14 tablet 0   doxycycline (VIBRA-TABS) 100 MG tablet Take 1 tablet (100 mg total) by mouth 2 (two) times daily. 20 tablet 0   fluticasone (FLONASE) 50 MCG/ACT nasal spray Place 2 sprays into both nostrils daily. 16 g 6   furosemide (LASIX) 20 MG tablet TAKE 1 TABLET BY MOUTH EVERY DAY 90 tablet 0   gabapentin (NEURONTIN) 100 MG capsule TAKE 1 CAPSULE BY MOUTH THREE TIMES A DAY 270 capsule 0   glimepiride (AMARYL) 4 MG tablet TAKE 1 TABLET BY MOUTH EVERY DAY BEFORE BREAKFAST 90 tablet 0   glucose blood test strip 1 each by Other route as needed. Reported on 11/02/2015     glycopyrrolate (ROBINUL) 2 MG tablet Take 1 tablet (2 mg total) by mouth 2 (two) times daily. FOR IBS 60 tablet 11   hyoscyamine (LEVSIN SL) 0.125 MG SL tablet Place 1 tablet (0.125 mg total) under the tongue every 4 (four) hours as needed for cramping. 30 tablet 6   lisinopril (ZESTRIL) 20 MG tablet TAKE 1 TABLET BY MOUTH EVERY DAY 90 tablet 0   omega-3 acid ethyl esters (LOVAZA) 1 g capsule Take 2 g by mouth.     ondansetron (ZOFRAN) 4 MG tablet TAKE 1-2 TABLETS BY MOUTH EVERY 8 HOURS AS NEEDED FOR NAUSEA 40 tablet 1   pantoprazole (PROTONIX) 40 MG tablet Take 1 tablet (40 mg total) by mouth in the morning. ** CALL OFFICE TO SCHEDULE APPOINTMENT 90 tablet 0   PROAIR HFA 108 (90 Base) MCG/ACT inhaler INHALE 2 PUFFS INTO THE LUNGS EVERY 6 HOURS AS NEEDED FOR WHEEZE 8.5 each 0   rosuvastatin (CRESTOR) 10 MG tablet Take 1 tablet (10 mg total) by mouth at bedtime. 90 tablet 1   traMADol (ULTRAM) 50 MG tablet Take 1 tablet (50 mg total) by mouth 3 (three) times daily as needed. 30 tablet 0   No facility-administered medications prior to visit.    Allergies  Allergen Reactions   Prednisone Palpitations and Other (See Comments)    Dose pack causes vomiting; Causes  Jerrye Bushy N/v/d N/v/d    Codeine Nausea Only and Palpitations  REACTION: palpitation  REACTION: palpitation     Ibuprofen Other (See Comments) and Rash    REACTION: questionable. Pt states that she can take this sometimes. GERD    Review of Systems  Constitutional:  Negative for fever.  HENT:  Negative for congestion, ear pain, hearing loss, sinus pain and sore throat.   Eyes:  Negative for blurred vision and pain.  Respiratory:  Negative for cough, sputum production, shortness of breath and wheezing.   Cardiovascular:  Negative for chest pain and palpitations.  Gastrointestinal:  Negative for blood in stool, constipation, diarrhea, nausea and vomiting.  Genitourinary:  Negative for dysuria, frequency, hematuria and urgency.  Musculoskeletal:  Negative for back pain, falls and myalgias.  Neurological:  Negative for dizziness, sensory change, loss of consciousness, weakness and headaches.  Endo/Heme/Allergies:  Negative for environmental allergies. Does not bruise/bleed easily.  Psychiatric/Behavioral:  Negative for depression and suicidal ideas. The patient is not nervous/anxious and does not have insomnia.       Objective:    Physical Exam Constitutional:      General: She is not in acute distress.    Appearance: Normal appearance. She is not ill-appearing.  HENT:     Head: Normocephalic and atraumatic.     Right Ear: External ear normal.     Left Ear: External ear normal.  Eyes:     Extraocular Movements: Extraocular movements intact.     Pupils: Pupils are equal, round, and reactive to light.  Cardiovascular:     Rate and Rhythm: Normal rate and regular rhythm.     Pulses: Normal pulses.     Heart sounds: Normal heart sounds. No murmur heard.   No gallop.  Pulmonary:     Effort: Pulmonary effort is normal. No respiratory distress.     Breath sounds: Normal breath sounds. No wheezing, rhonchi or rales.  Abdominal:     General: Bowel sounds are normal. There is no  distension.     Palpations: Abdomen is soft. There is no mass.     Tenderness: There is no abdominal tenderness. There is no guarding or rebound.     Hernia: No hernia is present.  Musculoskeletal:     Cervical back: Normal range of motion and neck supple.  Lymphadenopathy:     Cervical: No cervical adenopathy.  Skin:    General: Skin is warm and dry.  Neurological:     Mental Status: She is alert and oriented to person, place, and time.  Psychiatric:        Behavior: Behavior normal.    There were no vitals taken for this visit. Wt Readings from Last 3 Encounters:  05/25/21 182 lb (82.6 kg)  11/08/20 196 lb (88.9 kg)  05/11/20 200 lb (90.7 kg)    Diabetic Foot Exam - Simple   No data filed    Lab Results  Component Value Date   WBC 7.8 01/29/2020   HGB 10.9 (L) 01/29/2020   HCT 33.6 (L) 01/29/2020   PLT 273.0 01/29/2020   GLUCOSE 155 (H) 01/29/2020   CHOL 196 01/29/2020   TRIG 256.0 (H) 01/29/2020   HDL 33.40 (L) 01/29/2020   LDLDIRECT 108.0 01/29/2020   LDLCALC 115 (H) 04/06/2016   ALT 11 01/29/2020   AST 14 01/29/2020   NA 137 01/29/2020   K 4.5 01/29/2020   CL 106 01/29/2020   CREATININE 1.50 (H) 01/29/2020   BUN 31 (H) 01/29/2020   CO2 26 01/29/2020   TSH 2.63 10/01/2018   INR  0.99 10/14/2015   HGBA1C 9.4 (H) 01/29/2020   MICROALBUR 1.3 01/29/2020    Lab Results  Component Value Date   TSH 2.63 10/01/2018   Lab Results  Component Value Date   WBC 7.8 01/29/2020   HGB 10.9 (L) 01/29/2020   HCT 33.6 (L) 01/29/2020   MCV 87.0 01/29/2020   PLT 273.0 01/29/2020   Lab Results  Component Value Date   NA 137 01/29/2020   K 4.5 01/29/2020   CO2 26 01/29/2020   GLUCOSE 155 (H) 01/29/2020   BUN 31 (H) 01/29/2020   CREATININE 1.50 (H) 01/29/2020   BILITOT 0.4 01/29/2020   ALKPHOS 80 01/29/2020   AST 14 01/29/2020   ALT 11 01/29/2020   PROT 7.3 01/29/2020   ALBUMIN 4.1 01/29/2020   CALCIUM 9.5 01/29/2020   ANIONGAP 8 10/25/2015   GFR 34.69 (L)  01/29/2020   Lab Results  Component Value Date   CHOL 196 01/29/2020   Lab Results  Component Value Date   HDL 33.40 (L) 01/29/2020   Lab Results  Component Value Date   LDLCALC 115 (H) 04/06/2016   Lab Results  Component Value Date   TRIG 256.0 (H) 01/29/2020   Lab Results  Component Value Date   CHOLHDL 6 01/29/2020   Lab Results  Component Value Date   HGBA1C 9.4 (H) 01/29/2020       Assessment & Plan:   Problem List Items Addressed This Visit   None    No orders of the defined types were placed in this encounter.   I,[name],acting as a Education administrator for [employer], DO.,have documented all relevant documentation on the behalf of Ann Held, DO,as directed by  Ann Held, DO while in the presence of [place], DO. , personally preformed the services described in this documentation.  All medical record entries made by the scribe were at my direction and in my presence.  I have reviewed the chart and discharge instructions (if applicable) and agree that the record reflects my personal performance and is accurate and complete. 06/05/2021

## 2021-06-08 ENCOUNTER — Ambulatory Visit: Payer: No Typology Code available for payment source | Admitting: Family Medicine

## 2021-06-11 ENCOUNTER — Other Ambulatory Visit: Payer: Self-pay | Admitting: Family Medicine

## 2021-06-11 DIAGNOSIS — E785 Hyperlipidemia, unspecified: Secondary | ICD-10-CM

## 2021-06-11 DIAGNOSIS — I10 Essential (primary) hypertension: Secondary | ICD-10-CM

## 2021-07-06 ENCOUNTER — Ambulatory Visit (INDEPENDENT_AMBULATORY_CARE_PROVIDER_SITE_OTHER): Payer: No Typology Code available for payment source | Admitting: Family Medicine

## 2021-07-06 ENCOUNTER — Encounter: Payer: Self-pay | Admitting: Family Medicine

## 2021-07-06 VITALS — BP 134/78 | HR 100 | Temp 98.1°F | Resp 20 | Wt 194.8 lb

## 2021-07-06 DIAGNOSIS — N1831 Chronic kidney disease, stage 3a: Secondary | ICD-10-CM

## 2021-07-06 DIAGNOSIS — J40 Bronchitis, not specified as acute or chronic: Secondary | ICD-10-CM

## 2021-07-06 DIAGNOSIS — J0111 Acute recurrent frontal sinusitis: Secondary | ICD-10-CM

## 2021-07-06 DIAGNOSIS — I429 Cardiomyopathy, unspecified: Secondary | ICD-10-CM | POA: Diagnosis not present

## 2021-07-06 DIAGNOSIS — R509 Fever, unspecified: Secondary | ICD-10-CM

## 2021-07-06 DIAGNOSIS — R5383 Other fatigue: Secondary | ICD-10-CM | POA: Diagnosis not present

## 2021-07-06 LAB — COMPREHENSIVE METABOLIC PANEL
ALT: 11 U/L (ref 0–35)
AST: 14 U/L (ref 0–37)
Albumin: 4.1 g/dL (ref 3.5–5.2)
Alkaline Phosphatase: 83 U/L (ref 39–117)
BUN: 15 mg/dL (ref 6–23)
CO2: 25 mEq/L (ref 19–32)
Calcium: 9.4 mg/dL (ref 8.4–10.5)
Chloride: 106 mEq/L (ref 96–112)
Creatinine, Ser: 1.14 mg/dL (ref 0.40–1.20)
GFR: 49.65 mL/min — ABNORMAL LOW (ref >60.00)
Glucose, Bld: 167 mg/dL — ABNORMAL HIGH (ref 70–99)
Potassium: 3.9 mEq/L (ref 3.5–5.1)
Sodium: 139 mEq/L (ref 135–145)
Total Bilirubin: 0.4 mg/dL (ref 0.2–1.2)
Total Protein: 7.1 g/dL (ref 6.0–8.3)

## 2021-07-06 LAB — CBC
HCT: 36.2 % (ref 36.0–46.0)
Hemoglobin: 11.9 g/dL — ABNORMAL LOW (ref 12.0–15.0)
MCHC: 32.8 g/dL (ref 30.0–36.0)
MCV: 85.7 fl (ref 78.0–100.0)
Platelets: 316 10*3/uL (ref 150.0–400.0)
RBC: 4.22 Mil/uL (ref 3.87–5.11)
RDW: 13.1 % (ref 11.5–15.5)
WBC: 7.6 10*3/uL (ref 4.0–10.5)

## 2021-07-06 LAB — POCT INFLUENZA A/B
Influenza A, POC: NEGATIVE
Influenza B, POC: NEGATIVE

## 2021-07-06 LAB — BRAIN NATRIURETIC PEPTIDE: Pro B Natriuretic peptide (BNP): 62 pg/mL (ref 0.0–100.0)

## 2021-07-06 LAB — POC COVID19 BINAXNOW: SARS Coronavirus 2 Ag: NEGATIVE

## 2021-07-06 LAB — TSH: TSH: 2.27 u[IU]/mL (ref 0.35–5.50)

## 2021-07-06 MED ORDER — ALBUTEROL SULFATE HFA 108 (90 BASE) MCG/ACT IN AERS: INHALATION_SPRAY | RESPIRATORY_TRACT | 2 refills | Status: DC

## 2021-07-06 MED ORDER — AMOXICILLIN 500 MG PO CAPS: 1000.0000 mg | ORAL_CAPSULE | Freq: Two times a day (BID) | ORAL | 0 refills | Status: DC

## 2021-07-06 NOTE — Patient Instructions (Addendum)
You are negative for flu and covid today Please use amoxacillin for sinus infection Once you are well, please get your annual flu shot and a covid booster You might try a nasal decongestant spray such as Afrin spray otc (just use for 4-5 days) for your nose feeling so stuffy  Please continue to check your temperature and record- let us know if you are still running fevers (higher than 100) by the end of this week  Please schedule a check up with Dr Etter Sjogren asap to go over your general health maintenance

## 2021-07-06 NOTE — Progress Notes (Signed)
[facility] [address] [place], [place] [address] [phone]  Date:  07/06/2021   Name:  Beth Shepard   DOB:  [birth year]   MRN:  [id number]  PCP:  Ann Held, DO    Chief Complaint: Cough (Continued cough and some CXR's. Swelling in the nose and sore throat. She is afraid that the sewlling in her nose might be shingles returning. )   History of Present Illness:  Beth Shepard is a 66 y.o. very pleasant female patient who presents with the following:  Pt seen today with concern of possible "shingles" I have not seen her myself previously  History of dilated cariomyopathy, CKD, hyperlipdemia She was seen by Dr Etter Sjogren about 6 weeks ago with concern of illness - she was treated with doxycycline and tessalon perles for cough Most recent chest x-ray 12/5  COMPARISON:  X-ray chest 11/11/2020.   FINDINGS: Unchanged cardiomediastinal silhouette. Right medial basilar atelectasis, similar prior exam. No new airspace disease. No large pleural effusion. No visible pneumothorax. No acute osseous abnormality. Bilateral shoulder degenerative changes. Thoracic spondylosis. IMPRESSION: Right medial basilar atelectasis, similar to prior exam. No new airspace disease.  Pt notes she has "been sick off and on for 8 weeks," sx will wax and wane Current bout seemed to start 4 days ago Her temp was 101.1 yesterday- no fever today so far  She has body aches and chills  She has some cough but not as bad Right now she notes a ST She feels like her nose is swollen and thinks this might mean she has shingles? She reports having shingles a few times in the past   She is using flonase nasal spray   Per cardiology note 5/22:  Assessment and Plan  1. DCM (dilated cardiomyopathy)/Mild LV dysfunction ([place]) Echocardiogram 01/21/2019 revealed mild global LV hypokinesis. Paradoxical wall motion was noted consistent with LBBB. EF  45-50%. Trace TR. Noted to have no significant change as compared to prior echo.  Patient currently sleeping elevated to breathe in the setting of respiratory illness however at baseline denies orthopnea. Euvolemic and well perfused. On low-dose Lasix. For LV support continue carvedilol, lisinopril, spironolactone.   If the patient continues to experience orthopnea once respiratory illness has resolved it would be reasonable to repeat echocardiogram. This was discussed with the patient today who is agreeable to hold off on additional testing at this point   2. LBBB (left bundle branch block) Chronic. Stable. EKG actually looks improved as compared to prior   3. Hyperlipidemia LDL goal <70 Monitored by outside provider. On fenofibrate. Most recent lipid panel from 01/29/2020 revealed TC 196, TG 256, HDL 33, V LDL 51   4. Chronic kidney disease  Monitored by outside provider. 2 most recent creatinine levels in EMR at 1.01 and 1.50. On Lasix 20 mg daily.   5. Chronic peripheral edema Patient has been encouraged to use compression hose, elevation, and sodium restriction in diet. Euvolemic in office today. On Lasix 20 mg daily. Reports she is using diabetic hose which are very helpful. Edema resolves with elevation   6. Fibromyalgia Monitored and managed by PCP.  7. Acute respiratory illness/possible COVID? Patient tells me she has been battling a respiratory illness for approximately 10 days. Notes high fevers, cough, loss of taste, GI symptoms, etc. Monitored and managed by PCP. The patient tells me at this point she has had 2 negative COVID tests and a chest  x-ray negative for pneumonia. She was recently given Tessalon Perles, doxycycline, and albuterol per her report. She is still taking doxycycline. Symptoms do sound consistent with COVID despite negative COVID test.  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  Patient Active Problem List   Diagnosis Date Noted   IBS  (irritable bowel syndrome) 05/11/2020   Uncontrolled type 2 diabetes mellitus with hyperglycemia ([place]) 02/01/2020   Nausea without vomiting 01/27/2019   Preventative health care 12/18/2017   Infected sebaceous cyst of skin 07/19/2017   DCM (dilated cardiomyopathy) ([place]) 07/10/2017   Fibromyalgia 04/05/2017   Chronic pain syndrome 04/05/2017   Pain in both lower extremities 02/06/2017   Chronic venous insufficiency 02/06/2017   Cellulitis of lower extremity 08/28/2016   Pain in right ankle and joints of right foot 08/09/2016   Peripheral edema 07/10/2016   Trochanteric bursitis, right hip 05/03/2016   Chronic kidney disease (CKD) stage G3a/A1, moderately decreased glomerular filtration rate (GFR) between 45-59 mL/min/1.73 square meter and albuminuria creatinine ratio less than 30 mg/g (HCC) 04/07/2016   Primary osteoarthritis of right hip 10/24/2015   Hip joint replacement status 10/24/2015   Chest pain 08/25/2015   Right hip pain 08/25/2015   Chest pain, precordial 08/10/2015   Precordial pain 08/10/2015   Cardiomyopathy ([place]) 01/18/2015   Block, bundle branch, left 01/18/2015   Left bundle-branch block 01/18/2015   Diabetes ([place]) 01/18/2015   Nausea alone 12/17/2013   Diarrhea 11/19/2013   Bloating 11/19/2013   Obesity (BMI 30-39.9) 10/21/2013   Multiple joint pain 11/13/2012   MYALGIA 08/29/2010   ABDOMINAL PAIN OTHER SPECIFIED SITE 08/22/2009   OSTEOARTHROS UNSPEC WHETHER GEN/LOC UNSPEC SITE 03/17/2009   Cellulitis and abscess of face 12/17/2008   Disturbance of skin sensation 12/17/2008   Bronchitis 05/07/2008   Anxiety 12/04/2007   SINUSITIS, ACUTE NOS 12/19/2006   DM (diabetes mellitus) type II uncontrolled, periph vascular disorder 10/08/2006   Hyperlipidemia LDL goal <70 10/08/2006   Essential hypertension 10/08/2006   COPD (chronic obstructive pulmonary disease) ([place]) 10/08/2006   GERD 10/08/2006   Other postprocedural status(V45.89) 10/08/2006    Past Medical  History:  Diagnosis Date   Anxiety    Bilateral edema of lower extremity    Swelling in legs and ankles   Congestive heart failure (HCC)    COPD (chronic obstructive pulmonary disease) (HCC)    Diabetes mellitus    type 2   Gastric polyps    GERD (gastroesophageal reflux disease)    chronic   Hyperlipidemia    Hypertension    IBS (irritable bowel syndrome)    LBBB (left bundle branch block)    Dr. Gwenith Spitz, [facility]   Pneumonia    hx of   PONV (postoperative nausea and vomiting)    Had N/V after having tubal ligation and D&C   Rheumatoid arthritis ([place])    Shingles     Past Surgical History:  Procedure Laterality Date   CARDIAC CATHETERIZATION     No PCI   CHOLECYSTECTOMY     COLONOSCOPY  06/06/2005   normal    DILATION AND CURETTAGE OF UTERUS     ESOPHAGOGASTRODUODENOSCOPY  06/06/2005, 11/17/2013   JOINT REPLACEMENT Right 10/24/2015   rt hip replacement   KNEE ARTHROSCOPY Left    TOTAL HIP ARTHROPLASTY Right 10/24/2015   Procedure: RIGHT TOTAL HIP ARTHROPLASTY ANTERIOR APPROACH;  Surgeon: Leandrew Koyanagi, MD;  Location: [place];  Service: Orthopedics;  Laterality: Right;   TUBAL LIGATION  VAGINAL HYSTERECTOMY      Social History   Tobacco Use   Smoking status: Former    Years: 33.00    Types: Cigarettes    Quit date: 06/25/2002    Years since quitting: 19.0   Smokeless tobacco: Never  Vaping Use   Vaping Use: Never used  Substance Use Topics   Alcohol use: Yes    Comment: rare   Drug use: No    Family History  Problem Relation Age of Onset   Lymphoma Mother    Heart disease Mother        chf, cabg x6   Hypertension Mother    GER disease Mother    Hiatal hernia Mother    Gallstones Mother    Brain cancer Sister        brain stage 4   Heart disease Maternal Grandmother 28       MI   Heart disease Maternal Grandfather    Stroke Paternal Grandmother    Hypertension Paternal Grandmother    Stroke Paternal Grandfather    Hypertension  Paternal Grandfather    Rheum arthritis Father    Hypertension Daughter    Colon cancer Neg Hx    Esophageal cancer Neg Hx    Pancreatic cancer Neg Hx    Liver disease Neg Hx    Kidney disease Neg Hx     Allergies  Allergen Reactions   Prednisone Palpitations and Other (See Comments)    Dose pack causes vomiting; Causes Gerd N/v/d N/v/d    Codeine Nausea Only and Palpitations    REACTION: palpitation  REACTION: palpitation     Ibuprofen Other (See Comments) and Rash    REACTION: questionable. Pt states that she can take this sometimes. GERD    Medication list has been reviewed and updated.  Current Outpatient Medications on File Prior to Visit  Medication Sig Dispense Refill   ALPRAZolam (XANAX) 0.25 MG tablet Take 1 tablet (0.25 mg total) by mouth 3 (three) times daily as needed. 90 tablet 0   aspirin 81 MG tablet Take 81 mg by mouth daily.     benzonatate (TESSALON) 200 MG capsule Take 1 capsule (200 mg total) by mouth 2 (two) times daily as needed for cough. 20 capsule 0   Calcium Carb-Cholecalciferol (CALCIUM-VITAMIN D) 500-200 MG-UNIT tablet Take by mouth.     carvedilol (COREG) 12.5 MG tablet Take 1 tablet (12.5 mg total) by mouth 2 (two) times daily. 180 tablet 1   dicyclomine (BENTYL) 10 MG capsule Take 1 capsule (10 mg total) by mouth 4 (four) times daily -  before meals and at bedtime. 360 capsule 1   doxycycline (VIBRA-TABS) 100 MG tablet Take 1 tablet (100 mg total) by mouth 2 (two) times daily. 14 tablet 0   doxycycline (VIBRA-TABS) 100 MG tablet Take 1 tablet (100 mg total) by mouth 2 (two) times daily. 20 tablet 0   fluticasone (FLONASE) 50 MCG/ACT nasal spray Place 2 sprays into both nostrils daily. 16 g 6   furosemide (LASIX) 20 MG tablet TAKE 1 TABLET BY MOUTH EVERY DAY 90 tablet 1   gabapentin (NEURONTIN) 100 MG capsule TAKE 1 CAPSULE BY MOUTH THREE TIMES A DAY 270 capsule 0   glimepiride (AMARYL) 4 MG tablet TAKE 1 TABLET BY MOUTH EVERY DAY BEFORE  BREAKFAST 90 tablet 0   glucose blood test strip 1 each by Other route as needed. Reported on 11/02/2015     glycopyrrolate (ROBINUL) 2 MG tablet Take 1 tablet (2  mg total) by mouth 2 (two) times daily. FOR IBS 60 tablet 11   hyoscyamine (LEVSIN SL) 0.125 MG SL tablet Place 1 tablet (0.125 mg total) under the tongue every 4 (four) hours as needed for cramping. 30 tablet 6   lisinopril (ZESTRIL) 20 MG tablet TAKE 1 TABLET BY MOUTH EVERY DAY 90 tablet 0   omega-3 acid ethyl esters (LOVAZA) 1 g capsule Take 2 g by mouth.     ondansetron (ZOFRAN) 4 MG tablet TAKE 1-2 TABLETS BY MOUTH EVERY 8 HOURS AS NEEDED FOR NAUSEA 40 tablet 1   pantoprazole (PROTONIX) 40 MG tablet Take 1 tablet (40 mg total) by mouth in the morning. ** CALL OFFICE TO SCHEDULE APPOINTMENT 90 tablet 0   PROAIR HFA 108 (90 Base) MCG/ACT inhaler INHALE 2 PUFFS INTO THE LUNGS EVERY 6 HOURS AS NEEDED FOR WHEEZE 8.5 each 0   rosuvastatin (CRESTOR) 10 MG tablet Take 1 tablet (10 mg total) by mouth at bedtime. 90 tablet 1   traMADol (ULTRAM) 50 MG tablet Take 1 tablet (50 mg total) by mouth 3 (three) times daily as needed. 30 tablet 0   No current facility-administered medications on file prior to visit.    Review of Systems:  As per HPI- otherwise negative. Pulse Readings from Last 3 Encounters:  07/06/21 100  05/25/21 85  11/08/20 94     Physical Examination: Vitals:   07/06/21 0948  BP: 134/78  Pulse: 100  Resp: 20  Temp: 98.1 F (36.7 C)  SpO2: 98%   Vitals:   07/06/21 0948  Weight: 194 lb 12.8 oz (88.4 kg)   Body mass index is 36.81 kg/m. Ideal Body Weight:    GEN: no acute distress.  Obese, appears older than age  58: Atraumatic, Normocephalic.  Bilateral TM wnl, oropharynx normal.  PEERL,EOMI.  Nasal cavity is inflamed left side  Ears and Nose: No external deformity. CV: RRR, No M/G/R. No JVD. No thrill. No extra heart sounds. PULM: CTA B, no wheezes, crackles, rhonchi. No retractions. No resp.  distress. No accessory muscle use. EXTR: No c/c/e PSYCH: Normally interactive. Conversant.   Wt Readings from Last 3 Encounters:  07/06/21 194 lb 12.8 oz (88.4 kg)  05/25/21 182 lb (82.6 kg)  11/08/20 196 lb (88.9 kg)     Assessment and Plan: Fever, unspecified fever cause - Plan: POC COVID-19 BinaxNow, POCT Influenza A/B  Bronchitis - Plan: albuterol (PROAIR HFA) 108 (90 Base) MCG/ACT inhaler  Fatigue, unspecified type - Plan: POC COVID-19 BinaxNow, POCT Influenza A/B, TSH  Chronic kidney disease (CKD) stage G3a/A1, moderately decreased glomerular filtration rate (GFR) between 45-59 mL/min/1.73 square meter and albuminuria creatinine ratio less than 30 mg/g (HCC) - Plan: CBC, Comprehensive metabolic panel  Cardiomyopathy, unspecified type ([place]) - Plan: B Nat Peptide  Acute recurrent frontal sinusitis - Plan: amoxicillin (AMOXIL) 500 MG capsule Pt seen today with concern of possible illness  Tested for flu and covid today- negative Will treat for sinusitis with amoxicillin Follow-up on routine labs today   Asked her to monitor her temps and alert if fever continues Asked her to see DR Etter Sjogren soon for a routine visit   Signed Lamar Blinks, MD Received labs as below- message to pt  Results for orders placed or performed in visit on 07/06/21  B Nat Peptide  Result Value Ref Range   Pro B Natriuretic peptide (BNP) 62.0 0.0 - 100.0 pg/mL  CBC  Result Value Ref Range   WBC 7.6 4.0 - 10.5 K/uL  RBC 4.22 3.87 - 5.11 Mil/uL   Platelets 316.0 150.0 - 400.0 K/uL   Hemoglobin 11.9 (L) 12.0 - 15.0 g/dL   HCT 36.2 36.0 - 46.0 %   MCV 85.7 78.0 - 100.0 fl   MCHC 32.8 30.0 - 36.0 g/dL   RDW 13.1 11.5 - 15.5 %  Comprehensive metabolic panel  Result Value Ref Range   Sodium 139 135 - 145 mEq/L   Potassium 3.9 3.5 - 5.1 mEq/L   Chloride 106 96 - 112 mEq/L   CO2 25 19 - 32 mEq/L   Glucose, Bld 167 (H) 70 - 99 mg/dL   BUN 15 6 - 23 mg/dL   Creatinine, Ser 1.14 0.40 - 1.20 mg/dL    Total Bilirubin 0.4 0.2 - 1.2 mg/dL   Alkaline Phosphatase 83 39 - 117 U/L   AST 14 0 - 37 U/L   ALT 11 0 - 35 U/L   Total Protein 7.1 6.0 - 8.3 g/dL   Albumin 4.1 3.5 - 5.2 g/dL   GFR 49.65 (L) >60.00 mL/min   Calcium 9.4 8.4 - 10.5 mg/dL  TSH  Result Value Ref Range   TSH 2.27 0.35 - 5.50 uIU/mL  POC COVID-19 BinaxNow  Result Value Ref Range   SARS Coronavirus 2 Ag Negative Negative  POCT Influenza A/B  Result Value Ref Range   Influenza A, POC Negative Negative   Influenza B, POC Negative Negative

## 2021-07-22 ENCOUNTER — Other Ambulatory Visit: Payer: Self-pay | Admitting: Family Medicine

## 2021-07-22 DIAGNOSIS — F419 Anxiety disorder, unspecified: Secondary | ICD-10-CM

## 2021-07-22 DIAGNOSIS — I10 Essential (primary) hypertension: Secondary | ICD-10-CM

## 2021-07-22 DIAGNOSIS — E785 Hyperlipidemia, unspecified: Secondary | ICD-10-CM

## 2021-07-24 NOTE — Telephone Encounter (Signed)
Requesting: alprazolam 0.25mg   Contract: 04/05/2017 UDS: 08/30/2016 Last Visit: 05/25/2021 Next Visit: None Last Refill: 05/22/2021 #90 and 0RF  Please Advise

## 2021-07-29 ENCOUNTER — Other Ambulatory Visit: Payer: Self-pay | Admitting: Family Medicine

## 2021-07-29 DIAGNOSIS — E1151 Type 2 diabetes mellitus with diabetic peripheral angiopathy without gangrene: Secondary | ICD-10-CM

## 2021-08-19 ENCOUNTER — Other Ambulatory Visit: Payer: Self-pay | Admitting: Physician Assistant

## 2021-08-23 LAB — HM DIABETES EYE EXAM

## 2021-08-25 ENCOUNTER — Encounter: Payer: Self-pay | Admitting: *Deleted

## 2021-09-08 ENCOUNTER — Encounter: Payer: Self-pay | Admitting: Family Medicine

## 2021-09-08 ENCOUNTER — Ambulatory Visit (INDEPENDENT_AMBULATORY_CARE_PROVIDER_SITE_OTHER): Payer: No Typology Code available for payment source | Admitting: Family Medicine

## 2021-09-08 VITALS — BP 118/80 | HR 92 | Temp 98.7°F | Resp 20 | Ht 61.0 in | Wt 178.2 lb

## 2021-09-08 DIAGNOSIS — B370 Candidal stomatitis: Secondary | ICD-10-CM

## 2021-09-08 DIAGNOSIS — I10 Essential (primary) hypertension: Secondary | ICD-10-CM

## 2021-09-08 DIAGNOSIS — E785 Hyperlipidemia, unspecified: Secondary | ICD-10-CM | POA: Diagnosis not present

## 2021-09-08 DIAGNOSIS — R413 Other amnesia: Secondary | ICD-10-CM

## 2021-09-08 DIAGNOSIS — E1165 Type 2 diabetes mellitus with hyperglycemia: Secondary | ICD-10-CM | POA: Diagnosis not present

## 2021-09-08 MED ORDER — MAGIC MOUTHWASH: ORAL | 1 refills | Status: DC

## 2021-09-08 MED ORDER — SPIRONOLACTONE 25 MG PO TABS: 25.0000 mg | ORAL_TABLET | ORAL | 1 refills | Status: DC

## 2021-09-08 MED ORDER — FUROSEMIDE 20 MG PO TABS: 20.0000 mg | ORAL_TABLET | Freq: Every day | ORAL | 1 refills | Status: DC

## 2021-09-08 NOTE — Patient Instructions (Signed)

## 2021-09-08 NOTE — Assessment & Plan Note (Addendum)
Refer to neuro psych ?Pt getting lost when she drives and forgeting passwords at work ?Recommend she not drive anymore  ? ?

## 2021-09-08 NOTE — Assessment & Plan Note (Signed)
Encourage heart healthy diet such as MIND or DASH diet, increase exercise, avoid trans fats, simple carbohydrates and processed foods, consider a krill or fish or flaxseed oil cap daily.  °

## 2021-09-08 NOTE — Assessment & Plan Note (Signed)
Vs dry mouth ?Magic mouth wash  ?Also recommend she discuss with dentist  ?

## 2021-09-08 NOTE — Progress Notes (Addendum)
? ?Subjective:  ? ?By signing my name below, I, Beth Shepard, attest that this documentation has been prepared under the direction and in the presence of Beth Held, DO. 09/08/2021 ? ? Patient ID: Beth Shepard, female    DOB: Jul 03, 1955, 66 y.o.   MRN: [id number] ? ?Chief Complaint  ?Patient presents with  ? Sore Throat  ?  X2 months ago. Pt states sxs have been on and off. Pt states having dry mouth and states unable to eat and drink and reports dry skin around the mouth.   ? ? ?HPI ?Patient is in today for an office visit. She is accompanied by her granddaughter. ? ?She is complaining of a sore throat, sore tongue, dry mouth and tongue,loss of appetite and loss of taste of food. She has been drinking a lot of fluids because she is having trouble eating. She has tried biotin but it did not help. This has been going on for months.  No relief  ? ?She is requesting for a refill on 20 mg furosemide, carvedilol and spironolactone. ? ?She is complaining of trouble forming sentences; saying word she does not mean to say and confusing people. She was supposed to see a neurologist but never made the appointment. Her granddaughter reports she got lost while driving last week. She is also forgetting the passwords to her computer and phone system. ? ?Past Medical History:  ?Diagnosis Date  ? Anxiety   ? Bilateral edema of lower extremity   ? Swelling in legs and ankles  ? Congestive heart failure ([place])   ? COPD (chronic obstructive pulmonary disease) ([place])   ? Diabetes mellitus   ? type 2  ? Gastric polyps   ? GERD (gastroesophageal reflux disease)   ? chronic  ? Hyperlipidemia   ? Hypertension   ? IBS (irritable bowel syndrome)   ? LBBB (left bundle branch block)   ? Dr. Gwenith Spitz, [facility]  ? Pneumonia   ? hx of  ? PONV (postoperative nausea and vomiting)   ? Had N/V after having tubal ligation and D&C  ? Rheumatoid arthritis ([place])   ? Shingles   ? ? ?Past Surgical History:  ?Procedure Laterality  Date  ? CARDIAC CATHETERIZATION    ? No PCI  ? CHOLECYSTECTOMY    ? COLONOSCOPY  06/06/2005  ? normal   ? DILATION AND CURETTAGE OF UTERUS    ? ESOPHAGOGASTRODUODENOSCOPY  06/06/2005, 11/17/2013  ? JOINT REPLACEMENT Right 10/24/2015  ? rt hip replacement  ? KNEE ARTHROSCOPY Left   ? TOTAL HIP ARTHROPLASTY Right 10/24/2015  ? Procedure: RIGHT TOTAL HIP ARTHROPLASTY ANTERIOR APPROACH;  Surgeon: Leandrew Koyanagi, MD;  Location: [facility];  Service: Orthopedics;  Laterality: Right;  ? TUBAL LIGATION    ? VAGINAL HYSTERECTOMY    ? ? ?Family History  ?Problem Relation Age of Onset  ? Lymphoma Mother   ? Heart disease Mother   ?     chf, cabg x6  ? Hypertension Mother   ? GER disease Mother   ? Hiatal hernia Mother   ? Gallstones Mother   ? Brain cancer Sister   ?     brain stage 4  ? Heart disease Maternal Grandmother 3  ?     MI  ? Heart disease Maternal Grandfather   ? Stroke Paternal Grandmother   ? Hypertension Paternal Grandmother   ? Stroke Paternal Grandfather   ? Hypertension Paternal Grandfather   ? Rheum arthritis Father   ?  Hypertension Daughter   ? Colon cancer Neg Hx   ? Esophageal cancer Neg Hx   ? Pancreatic cancer Neg Hx   ? Liver disease Neg Hx   ? Kidney disease Neg Hx   ? ? ?Social History  ? ?Socioeconomic History  ? Marital status: Widowed  ?  Spouse name: Not on file  ? Number of children: 2  ? Years of education: Not on file  ? Highest education level: Not on file  ?Occupational History  ? Occupation: Administive   ?  Employer: [employer]  ?Tobacco Use  ? Smoking status: Former  ?  Years: 33.00  ?  Types: Cigarettes  ?  Quit date: 06/25/2002  ?  Years since quitting: 19.2  ? Smokeless tobacco: Never  ?Vaping Use  ? Vaping Use: Never used  ?Substance and Sexual Activity  ? Alcohol use: Yes  ?  Comment: rare  ? Drug use: No  ? Sexual activity: Not Currently  ?  Partners: Male  ?Other Topics Concern  ? Not on file  ?Social History Narrative  ? Limited exercise per cardiology  ? ?Social Determinants  of Health  ? ?Financial Resource Strain: Not on file  ?Food Insecurity: Not on file  ?Transportation Needs: Not on file  ?Physical Activity: Not on file  ?Stress: Not on file  ?Social Connections: Not on file  ?Intimate Partner Violence: Not on file  ? ? ?Outpatient Medications Prior to Visit  ?Medication Sig Dispense Refill  ? albuterol (PROAIR HFA) 108 (90 Base) MCG/ACT inhaler INHALE 2 PUFFS INTO THE LUNGS EVERY 6 HOURS AS NEEDED FOR WHEEZE 18 each 2  ? ALPRAZolam (XANAX) 0.25 MG tablet TAKE 1 TABLET BY MOUTH 3 TIMES DAILY AS NEEDED. 90 tablet 0  ? aspirin 81 MG tablet Take 81 mg by mouth daily.    ? Calcium Carb-Cholecalciferol (CALCIUM-VITAMIN D) 500-200 MG-UNIT tablet Take by mouth.    ? carvedilol (COREG) 12.5 MG tablet Take 1 tablet (12.5 mg total) by mouth 2 (two) times daily. 180 tablet 1  ? dicyclomine (BENTYL) 10 MG capsule Take 1 capsule (10 mg total) by mouth 4 (four) times daily -  before meals and at bedtime. 360 capsule 1  ? fluticasone (FLONASE) 50 MCG/ACT nasal spray Place 2 sprays into both nostrils daily. 16 g 6  ? gabapentin (NEURONTIN) 100 MG capsule TAKE 1 CAPSULE BY MOUTH THREE TIMES A DAY 270 capsule 0  ? glimepiride (AMARYL) 4 MG tablet TAKE 1 TABLET BY MOUTH EVERY DAY BEFORE BREAKFAST 90 tablet 0  ? glucose blood test strip 1 each by Other route as needed. Reported on 11/02/2015    ? glycopyrrolate (ROBINUL) 2 MG tablet Take 1 tablet (2 mg total) by mouth 2 (two) times daily. FOR IBS 60 tablet 11  ? hyoscyamine (LEVSIN SL) 0.125 MG SL tablet Place 1 tablet (0.125 mg total) under the tongue every 4 (four) hours as needed for cramping. 30 tablet 6  ? lisinopril (ZESTRIL) 20 MG tablet TAKE 1 TABLET BY MOUTH EVERY DAY 90 tablet 0  ? omega-3 acid ethyl esters (LOVAZA) 1 g capsule Take 2 g by mouth.    ? ondansetron (ZOFRAN) 4 MG tablet TAKE 1-2 TABLETS BY MOUTH EVERY 8 HOURS AS NEEDED FOR NAUSEA 40 tablet 1  ? pantoprazole (PROTONIX) 40 MG tablet Take 1 tablet (40 mg total) by mouth in the  morning. ** CALL OFFICE TO SCHEDULE APPOINTMENT 90 tablet 0  ? rosuvastatin (CRESTOR) 10 MG tablet Take  1 tablet (10 mg total) by mouth at bedtime. 90 tablet 1  ? traMADol (ULTRAM) 50 MG tablet Take 1 tablet (50 mg total) by mouth 3 (three) times daily as needed. 30 tablet 0  ? furosemide (LASIX) 20 MG tablet TAKE 1 TABLET BY MOUTH EVERY DAY 90 tablet 1  ? amoxicillin (AMOXIL) 500 MG capsule Take 2 capsules (1,000 mg total) by mouth 2 (two) times daily. (Patient not taking: Reported on 09/08/2021) 40 capsule 0  ? benzonatate (TESSALON) 200 MG capsule Take 1 capsule (200 mg total) by mouth 2 (two) times daily as needed for cough. (Patient not taking: Reported on 09/08/2021) 20 capsule 0  ? ?No facility-administered medications prior to visit.  ? ? ?Allergies  ?Allergen Reactions  ? Prednisone Palpitations and Other (See Comments)  ?  Dose pack causes vomiting; Causes Jerrye Bushy ?N/v/d ?N/v/d ?  ? Codeine Nausea Only and Palpitations  ?  REACTION: palpitation  ?REACTION: palpitation  ?  ? Ibuprofen Other (See Comments) and Rash  ?  REACTION: questionable. Pt states that she can take this sometimes. ?GERD  ? ? ?Review of Systems  ?Constitutional:  Negative for fever.  ?     (+) loss of appetite  ?HENT:  Positive for sore throat. Negative for congestion, ear pain, hearing loss and sinus pain.   ?     (+) sore and dry tongue ?(+) trouble swallowing  ?Eyes:  Negative for blurred vision and pain.  ?Respiratory:  Negative for cough, sputum production, shortness of breath and wheezing.   ?Cardiovascular:  Negative for chest pain and palpitations.  ?Gastrointestinal:  Negative for blood in stool, constipation, diarrhea, nausea and vomiting.  ?Genitourinary:  Negative for dysuria, frequency, hematuria and urgency.  ?Musculoskeletal:  Negative for back pain, falls and myalgias.  ?Neurological:  Negative for dizziness, sensory change, loss of consciousness, weakness and headaches.  ?     (+) trouble forming sentences   ?Endo/Heme/Allergies:  Negative for environmental allergies. Does not bruise/bleed easily.  ?Psychiatric/Behavioral:  Negative for depression and suicidal ideas. The patient is not nervous/anxious and does not have insomnia.

## 2021-09-08 NOTE — Assessment & Plan Note (Signed)
Well controlled, no changes to meds. Encouraged heart healthy diet such as the DASH diet and exercise as tolerated.  °

## 2021-09-08 NOTE — Assessment & Plan Note (Signed)
Uncontrolled  ?Check labs ?Refer to endo ?

## 2021-09-11 ENCOUNTER — Other Ambulatory Visit: Payer: No Typology Code available for payment source

## 2021-09-11 ENCOUNTER — Other Ambulatory Visit (INDEPENDENT_AMBULATORY_CARE_PROVIDER_SITE_OTHER): Payer: No Typology Code available for payment source

## 2021-09-11 ENCOUNTER — Telehealth: Payer: Self-pay

## 2021-09-11 DIAGNOSIS — E785 Hyperlipidemia, unspecified: Secondary | ICD-10-CM

## 2021-09-11 DIAGNOSIS — E1165 Type 2 diabetes mellitus with hyperglycemia: Secondary | ICD-10-CM | POA: Diagnosis not present

## 2021-09-11 DIAGNOSIS — I10 Essential (primary) hypertension: Secondary | ICD-10-CM | POA: Diagnosis not present

## 2021-09-11 LAB — COMPREHENSIVE METABOLIC PANEL
ALT: 13 U/L (ref 0–35)
AST: 15 U/L (ref 0–37)
Albumin: 4.3 g/dL (ref 3.5–5.2)
Alkaline Phosphatase: 100 U/L (ref 39–117)
BUN: 10 mg/dL (ref 6–23)
CO2: 30 mEq/L (ref 19–32)
Calcium: 9.7 mg/dL (ref 8.4–10.5)
Chloride: 97 mEq/L (ref 96–112)
Creatinine, Ser: 1.17 mg/dL (ref 0.40–1.20)
GFR: 48.07 mL/min — ABNORMAL LOW (ref >60.00)
Glucose, Bld: 350 mg/dL — ABNORMAL HIGH (ref 70–99)
Potassium: 3.6 mEq/L (ref 3.5–5.1)
Sodium: 136 mEq/L (ref 135–145)
Total Bilirubin: 0.6 mg/dL (ref 0.2–1.2)
Total Protein: 7.2 g/dL (ref 6.0–8.3)

## 2021-09-11 LAB — LIPID PANEL
Cholesterol: 228 mg/dL — ABNORMAL HIGH (ref 0–200)
HDL: 46 mg/dL (ref >39.00)
NonHDL: 181.83
Total CHOL/HDL Ratio: 5
Triglycerides: 271 mg/dL — ABNORMAL HIGH (ref 0.0–149.0)
VLDL: 54.2 mg/dL — ABNORMAL HIGH (ref 0.0–40.0)

## 2021-09-11 LAB — HEMOGLOBIN A1C: Hgb A1c MFr Bld: 12.4 % — ABNORMAL HIGH (ref 4.6–6.5)

## 2021-09-11 NOTE — Telephone Encounter (Signed)
Caller Name [facility] ?Caller Phone Number [phone] ?Patient Name Beth Shepard ?Patient DOB 1956-02-05 ?Call Type Message Only Information Provided ?Reason for Call Request to Reschedule Office Appointment ?Initial Comment Caller states she needs to reschedule an appointment. ?Patient request to speak to RN No ?Additional Comment Needs to change the appointment to the later appointment time that was offered, 150p 03/20 ?Provided office hours. Triage declined. ?Disp. Time Disposition Final User ?09/08/2021 5:32:08 PM General Information Provided Yes Mack Hook ?Call Closed By: Mack Hook ?Transaction Date/Time: 09/08/2021 5:28:04 PM (ET) ?

## 2021-09-12 ENCOUNTER — Telehealth: Payer: Self-pay | Admitting: *Deleted

## 2021-09-12 LAB — MICROALBUMIN / CREATININE URINE RATIO
Creatinine,U: 82.9 mg/dL
Microalb Creat Ratio: 16.1 mg/g (ref 0.0–30.0)
Microalb, Ur: 13.3 mg/dL — ABNORMAL HIGH (ref 0.0–1.9)

## 2021-09-12 LAB — LDL CHOLESTEROL, DIRECT: Direct LDL: 148 mg/dL

## 2021-09-12 NOTE — Addendum Note (Signed)
Addended by: Kelle Darting A on: 09/12/2021 03:27 PM ? ? Modules accepted: Orders ? ?

## 2021-09-12 NOTE — Telephone Encounter (Signed)
Received call from Santiago Glad at [facility] stating pt's CBC from yesterday has platelet clumps in it and will need to be redrawn with a light blue tube as well as lavender tube.  Notified pt. States she will have to check her schedule then call us back to arrange lab appt for redraw.  Future lab order placed.  ?

## 2021-09-18 ENCOUNTER — Ambulatory Visit (INDEPENDENT_AMBULATORY_CARE_PROVIDER_SITE_OTHER): Payer: No Typology Code available for payment source | Admitting: Family Medicine

## 2021-09-18 ENCOUNTER — Other Ambulatory Visit: Payer: No Typology Code available for payment source

## 2021-09-18 ENCOUNTER — Telehealth: Payer: Self-pay | Admitting: Family Medicine

## 2021-09-18 ENCOUNTER — Encounter: Payer: Self-pay | Admitting: Family Medicine

## 2021-09-18 VITALS — BP 128/78 | HR 94 | Temp 98.4°F | Resp 16 | Ht 61.0 in | Wt 182.2 lb

## 2021-09-18 DIAGNOSIS — R413 Other amnesia: Secondary | ICD-10-CM | POA: Diagnosis not present

## 2021-09-18 DIAGNOSIS — B370 Candidal stomatitis: Secondary | ICD-10-CM

## 2021-09-18 DIAGNOSIS — E1165 Type 2 diabetes mellitus with hyperglycemia: Secondary | ICD-10-CM | POA: Diagnosis not present

## 2021-09-18 DIAGNOSIS — E1169 Type 2 diabetes mellitus with other specified complication: Secondary | ICD-10-CM | POA: Diagnosis not present

## 2021-09-18 DIAGNOSIS — I10 Essential (primary) hypertension: Secondary | ICD-10-CM | POA: Diagnosis not present

## 2021-09-18 DIAGNOSIS — E785 Hyperlipidemia, unspecified: Secondary | ICD-10-CM | POA: Insufficient documentation

## 2021-09-18 MED ORDER — BLOOD GLUCOSE MONITOR KIT: PACK | 0 refills | Status: AC

## 2021-09-18 MED ORDER — MAGIC MOUTHWASH: ORAL | 1 refills | Status: DC

## 2021-09-18 MED ORDER — SITAGLIPTIN PHOSPHATE 50 MG PO TABS: 50.0000 mg | ORAL_TABLET | Freq: Every day | ORAL | 2 refills | Status: DC

## 2021-09-18 NOTE — Assessment & Plan Note (Signed)
Add [place] ?Check blood sugars regularly  ? ?

## 2021-09-18 NOTE — Telephone Encounter (Signed)
Rx has been faxed over to pharmacy. ?

## 2021-09-18 NOTE — Progress Notes (Addendum)
? ?Subjective:  ? ?By signing my name below, I, Carylon Perches, attest that this documentation has been prepared under the direction and in the presence of Roma Schanz R DO 09/18/2021 ? ? Patient ID: Beth Shepard, female    DOB: 10-Jun-1956, 66 y.o.   MRN: [id number] ? ?Chief Complaint  ?Patient presents with  ? Memory Loss  ?  Here for Memory loss and problem remember words   ? ? ?HPI ?Patient is in today for an office visit. She is with her daughter. ? ?Patient complains of confusion. Her daughter states that she has been forgetting her passwords.  ? ?Her A1C levels are increasing. She has been drinking water, tomato juice, and occasionally milk. She has not been eating regularly. She states that her taste has changed to the point where she does not like the taste of most things.  ?Lab Results  ?Component Value Date  ? HGBA1C 12.4 (H) 09/11/2021  ? ?She has not been consistently taking 10 MG of Crestor.  ?Lab Results  ?Component Value Date  ? CHOL 228 (H) 09/11/2021  ? HDL 46.00 09/11/2021  ? LDLCALC 115 (H) 04/06/2016  ? LDLDIRECT 148.0 09/11/2021  ? TRIG 271.0 (H) 09/11/2021  ? CHOLHDL 5 09/11/2021  ? ?Past Medical History:  ?Diagnosis Date  ? Anxiety   ? Bilateral edema of lower extremity   ? Swelling in legs and ankles  ? Congestive heart failure ([place])   ? COPD (chronic obstructive pulmonary disease) ([place])   ? Diabetes mellitus   ? type 2  ? Gastric polyps   ? GERD (gastroesophageal reflux disease)   ? chronic  ? Hyperlipidemia   ? Hypertension   ? IBS (irritable bowel syndrome)   ? LBBB (left bundle branch block)   ? Dr. Gwenith Spitz, [facility]  ? Pneumonia   ? hx of  ? PONV (postoperative nausea and vomiting)   ? Had N/V after having tubal ligation and D&C  ? Rheumatoid arthritis ([place])   ? Shingles   ? ? ?Past Surgical History:  ?Procedure Laterality Date  ? CARDIAC CATHETERIZATION    ? No PCI  ? CHOLECYSTECTOMY    ? COLONOSCOPY  06/06/2005  ? normal   ? DILATION AND CURETTAGE OF UTERUS     ? ESOPHAGOGASTRODUODENOSCOPY  06/06/2005, 11/17/2013  ? JOINT REPLACEMENT Right 10/24/2015  ? rt hip replacement  ? KNEE ARTHROSCOPY Left   ? TOTAL HIP ARTHROPLASTY Right 10/24/2015  ? Procedure: RIGHT TOTAL HIP ARTHROPLASTY ANTERIOR APPROACH;  Surgeon: Leandrew Koyanagi, MD;  Location: [facility];  Service: Orthopedics;  Laterality: Right;  ? TUBAL LIGATION    ? VAGINAL HYSTERECTOMY    ? ? ?Family History  ?Problem Relation Age of Onset  ? Lymphoma Mother   ? Heart disease Mother   ?     chf, cabg x6  ? Hypertension Mother   ? GER disease Mother   ? Hiatal hernia Mother   ? Gallstones Mother   ? Brain cancer Sister   ?     brain stage 4  ? Heart disease Maternal Grandmother 65  ?     MI  ? Heart disease Maternal Grandfather   ? Stroke Paternal Grandmother   ? Hypertension Paternal Grandmother   ? Stroke Paternal Grandfather   ? Hypertension Paternal Grandfather   ? Rheum arthritis Father   ? Hypertension Daughter   ? Colon cancer Neg Hx   ? Esophageal cancer Neg Hx   ? Pancreatic cancer  Neg Hx   ? Liver disease Neg Hx   ? Kidney disease Neg Hx   ? ? ?Social History  ? ?Socioeconomic History  ? Marital status: Widowed  ?  Spouse name: Not on file  ? Number of children: 2  ? Years of education: Not on file  ? Highest education level: Not on file  ?Occupational History  ? Occupation: Administive   ?  Employer: [employer]  ?Tobacco Use  ? Smoking status: Former  ?  Years: 33.00  ?  Types: Cigarettes  ?  Quit date: 06/25/2002  ?  Years since quitting: 19.2  ? Smokeless tobacco: Never  ?Vaping Use  ? Vaping Use: Never used  ?Substance and Sexual Activity  ? Alcohol use: Yes  ?  Comment: rare  ? Drug use: No  ? Sexual activity: Not Currently  ?  Partners: Male  ?Other Topics Concern  ? Not on file  ?Social History Narrative  ? Limited exercise per cardiology  ? ?Social Determinants of Health  ? ?Financial Resource Strain: Not on file  ?Food Insecurity: Not on file  ?Transportation Needs: Not on file  ?Physical Activity:  Not on file  ?Stress: Not on file  ?Social Connections: Not on file  ?Intimate Partner Violence: Not on file  ? ? ?Outpatient Medications Prior to Visit  ?Medication Sig Dispense Refill  ? albuterol (PROAIR HFA) 108 (90 Base) MCG/ACT inhaler INHALE 2 PUFFS INTO THE LUNGS EVERY 6 HOURS AS NEEDED FOR WHEEZE 18 each 2  ? ALPRAZolam (XANAX) 0.25 MG tablet TAKE 1 TABLET BY MOUTH 3 TIMES DAILY AS NEEDED. 90 tablet 0  ? aspirin 81 MG tablet Take 81 mg by mouth daily.    ? Calcium Carb-Cholecalciferol (CALCIUM-VITAMIN D) 500-200 MG-UNIT tablet Take by mouth.    ? carvedilol (COREG) 12.5 MG tablet Take 1 tablet (12.5 mg total) by mouth 2 (two) times daily. 180 tablet 1  ? dicyclomine (BENTYL) 10 MG capsule Take 1 capsule (10 mg total) by mouth 4 (four) times daily -  before meals and at bedtime. 360 capsule 1  ? fluticasone (FLONASE) 50 MCG/ACT nasal spray Place 2 sprays into both nostrils daily. 16 g 6  ? furosemide (LASIX) 20 MG tablet Take 1 tablet (20 mg total) by mouth daily. 90 tablet 1  ? gabapentin (NEURONTIN) 100 MG capsule TAKE 1 CAPSULE BY MOUTH THREE TIMES A DAY 270 capsule 0  ? glimepiride (AMARYL) 4 MG tablet TAKE 1 TABLET BY MOUTH EVERY DAY BEFORE BREAKFAST 90 tablet 0  ? glucose blood test strip 1 each by Other route as needed. Reported on 11/02/2015    ? glycopyrrolate (ROBINUL) 2 MG tablet Take 1 tablet (2 mg total) by mouth 2 (two) times daily. FOR IBS 60 tablet 11  ? hyoscyamine (LEVSIN SL) 0.125 MG SL tablet Place 1 tablet (0.125 mg total) under the tongue every 4 (four) hours as needed for cramping. 30 tablet 6  ? lisinopril (ZESTRIL) 20 MG tablet TAKE 1 TABLET BY MOUTH EVERY DAY 90 tablet 0  ? magic mouthwash SOLN Swish and spit qid 240 mL 1  ? omega-3 acid ethyl esters (LOVAZA) 1 g capsule Take 2 g by mouth.    ? ondansetron (ZOFRAN) 4 MG tablet TAKE 1-2 TABLETS BY MOUTH EVERY 8 HOURS AS NEEDED FOR NAUSEA 40 tablet 1  ? pantoprazole (PROTONIX) 40 MG tablet Take 1 tablet (40 mg total) by mouth in the  morning. ** CALL OFFICE TO SCHEDULE APPOINTMENT 90  tablet 0  ? rosuvastatin (CRESTOR) 10 MG tablet Take 1 tablet (10 mg total) by mouth at bedtime. 90 tablet 1  ? spironolactone (ALDACTONE) 25 MG tablet Take 1 tablet (25 mg total) by mouth every other day. 90 tablet 1  ? traMADol (ULTRAM) 50 MG tablet Take 1 tablet (50 mg total) by mouth 3 (three) times daily as needed. 30 tablet 0  ? ?No facility-administered medications prior to visit.  ? ? ?Allergies  ?Allergen Reactions  ? Prednisone Palpitations and Other (See Comments)  ?  Dose pack causes vomiting; Causes Jerrye Bushy ?N/v/d ?N/v/d ?  ? Codeine Nausea Only and Palpitations  ?  REACTION: palpitation  ?REACTION: palpitation  ?  ? Ibuprofen Other (See Comments) and Rash  ?  REACTION: questionable. Pt states that she can take this sometimes. ?GERD  ? ? ?Review of Systems  ?Constitutional:  Negative for fever and malaise/fatigue.  ?HENT:  Negative for congestion.   ?Eyes:  Negative for blurred vision.  ?Respiratory:  Negative for shortness of breath.   ?Cardiovascular:  Negative for chest pain, palpitations and leg swelling.  ?Gastrointestinal:  Negative for abdominal pain, blood in stool and nausea.  ?Genitourinary:  Negative for dysuria and frequency.  ?Musculoskeletal:  Negative for falls.  ?Skin:  Negative for rash.  ?Neurological:  Negative for dizziness, loss of consciousness and headaches.  ?Endo/Heme/Allergies:  Negative for environmental allergies.  ?Psychiatric/Behavioral:  Positive for memory loss. Negative for depression. The patient is not nervous/anxious.   ? ?   ?Objective:  ?  ?Physical Exam ?Vitals and nursing note reviewed.  ?Constitutional:   ?   General: She is not in acute distress. ?   Appearance: Normal appearance. She is not ill-appearing.  ?HENT:  ?   Head: Normocephalic and atraumatic.  ?   Right Ear: External ear normal.  ?   Left Ear: External ear normal.  ?Eyes:  ?   Extraocular Movements: Extraocular movements intact.  ?   Pupils: Pupils are  equal, round, and reactive to light.  ?Cardiovascular:  ?   Rate and Rhythm: Normal rate and regular rhythm.  ?   Heart sounds: Normal heart sounds. No murmur heard. ?  No gallop.  ?Pulmonary:  ?   [place]

## 2021-09-18 NOTE — Assessment & Plan Note (Signed)
Tolerating statin, encouraged heart healthy diet, avoid trans fats, minimize simple carbs and saturated fats. Increase exercise as tolerated ?Pt has not been taking the crestor daily ?D/w with pt and her daughter the importance of taking meds daily ?

## 2021-09-18 NOTE — Patient Instructions (Signed)

## 2021-09-18 NOTE — Telephone Encounter (Signed)
Pt states on her last visit on 3/17 Dr. Etter Sjogren was going to send in a rx for mouthwash [facility]as [facility]. Would like for the rx to be sent in please. ?

## 2021-09-18 NOTE — Assessment & Plan Note (Signed)
Answered all pt daughters questions  ?Neuro psych pending  ?Do not drive  ?

## 2021-09-18 NOTE — Assessment & Plan Note (Signed)
Well controlled, no changes to meds. Encouraged heart healthy diet such as the DASH diet and exercise as tolerated.  °

## 2021-09-19 ENCOUNTER — Other Ambulatory Visit: Payer: Self-pay | Admitting: Physician Assistant

## 2021-09-19 ENCOUNTER — Telehealth: Payer: Self-pay | Admitting: *Deleted

## 2021-09-19 ENCOUNTER — Telehealth: Payer: Self-pay

## 2021-09-19 MED ORDER — LINAGLIPTIN 5 MG PO TABS: 5.0000 mg | ORAL_TABLET | Freq: Every day | ORAL | 2 refills | Status: DC

## 2021-09-19 NOTE — Telephone Encounter (Signed)
[facility] stated that Januvia is not [facility].  The preferred is Tradjenta or metformin. ?

## 2021-09-19 NOTE — Telephone Encounter (Signed)
PA initiated via Covermymeds; KEY: [id number]. Awaiting determination.  ?

## 2021-09-19 NOTE — Telephone Encounter (Signed)
New rx sent in and left message on patients machine that medication has been changed. ?

## 2021-09-19 NOTE — Telephone Encounter (Signed)
PA approved. Effective 09/19/21 to 09/18/2024. ?

## 2021-09-20 ENCOUNTER — Telehealth: Payer: Self-pay | Admitting: Family Medicine

## 2021-09-20 ENCOUNTER — Other Ambulatory Visit: Payer: No Typology Code available for payment source

## 2021-09-20 ENCOUNTER — Telehealth: Payer: Self-pay | Admitting: *Deleted

## 2021-09-20 NOTE — Chronic Care Management (AMB) (Signed)
?  Care Management  ? ?Note ? ?09/20/2021 ?Name: Dilan Fullenwider MRN: [id number] DOB: [birth year] ? ?Beth Shepard is a 66 y.o. year old female who is a primary care patient of Ann Held, DO. I reached out to Jordan by phone today offer care coordination services.  ? ?[name] was given information about care management services today including:  ?Care management services include personalized support from designated clinical staff supervised by her physician, including individualized plan of care and coordination with other care providers ?24/7 contact phone numbers for assistance for urgent and routine care needs. ?The patient may stop care management services at any time by phone call to the office staff. ? ?Patient agreed to services and verbal consent obtained.  ? ?Follow up plan: ?Telephone appointment with care management team member scheduled for: 10/13/2021 ? ?Caydyn Sprung, CCMA ?Care Guide, Embedded Care Coordination ?[facility]  Care Management  ?Direct Dial: [phone] ? ? ?

## 2021-09-20 NOTE — Telephone Encounter (Signed)
Patient states the [place] is costing her over $400 and its too expensive, she would like to know if Metformin could be sent instead. Please advise.  ?

## 2021-09-20 NOTE — Telephone Encounter (Signed)
Pt's daughter called for update on new med request.  ? ?Pt's daughter will like for someone to contact her with the new med change [phone]  ? ?Please advise. ?

## 2021-09-21 NOTE — Telephone Encounter (Signed)
Spoke with patient. Pt states she wasn't aware that she had a hx of kidney diease and she is sure about taking the shot. Pt states she will have her daughter call back to discuss ?

## 2021-09-21 NOTE — Telephone Encounter (Signed)
Daughter called back to get more information stating her mom was confused. She would like a call back to [phone] to further discuss the medication.  ?

## 2021-09-21 NOTE — Telephone Encounter (Signed)
See other phone note

## 2021-09-22 ENCOUNTER — Other Ambulatory Visit: Payer: Self-pay | Admitting: Family Medicine

## 2021-09-22 ENCOUNTER — Other Ambulatory Visit: Payer: Self-pay

## 2021-09-22 DIAGNOSIS — E1165 Type 2 diabetes mellitus with hyperglycemia: Secondary | ICD-10-CM

## 2021-09-22 MED ORDER — OZEMPIC (0.25 OR 0.5 MG/DOSE) 2 MG/1.5ML ~~LOC~~ SOPN: 0.5000 mg | PEN_INJECTOR | SUBCUTANEOUS | 0 refills | Status: DC

## 2021-09-22 NOTE — Telephone Encounter (Signed)
Spoke with patient's daughter, Jinny Blossom. She states they would like to see how much the Ozempic would cost. She advised to call pt's insurance for formulary. Ozempic sent in   ?

## 2021-09-25 ENCOUNTER — Telehealth: Payer: Self-pay

## 2021-09-25 NOTE — Telephone Encounter (Signed)
PA initiated via Covermymeds; KEY: [id number]. Awaiting determination.  ?

## 2021-09-25 NOTE — Telephone Encounter (Signed)
PA approved. Effective 09/25/2021 to 09/25/2024 ?

## 2021-09-28 ENCOUNTER — Telehealth: Payer: Self-pay

## 2021-09-28 ENCOUNTER — Other Ambulatory Visit: Payer: Self-pay | Admitting: Family Medicine

## 2021-09-28 DIAGNOSIS — E1151 Type 2 diabetes mellitus with diabetic peripheral angiopathy without gangrene: Secondary | ICD-10-CM

## 2021-09-28 MED ORDER — GLIMEPIRIDE 4 MG PO TABS: ORAL_TABLET | ORAL | 3 refills | Status: DC

## 2021-09-28 NOTE — Telephone Encounter (Signed)
Do you know what medication the patient is referring to that was paused? Pt Ozempic was approved ?

## 2021-09-28 NOTE — Telephone Encounter (Signed)
Caller Name Beth Shepard ?Caller Phone Number [phone] ?Patient Name Beth Shepard ?Patient DOB 08/09/1955 ?Call Type Message Only Information Provided ?Reason for Call Request for General Office Information ?Initial Comment Caller states that the dr changed some of her medications and she just found out that her blood ?sugars are running higher, and she will be starting shots as well. She would like for the dr to ?release her medications that were paused so that she can start them back up again. ?Additional Comment Caller states that the dr changed some of her medications and she just found out that her blood ?sugars are running higher, and she will be starting shots as well. She would like for the dr to ?release her medications that were paused so that she can start them back up again. Office hours ?provided ?Disp. Time Disposition Final User ?09/28/2021 12:21:08 PM General Information Provided Yes Virgil Benedict ?Call Closed By: Virgil Benedict ?Transaction Date/Time: 09/28/2021 12:15:43 PM (ET) ?

## 2021-10-03 ENCOUNTER — Telehealth: Payer: Self-pay | Admitting: Family Medicine

## 2021-10-03 ENCOUNTER — Other Ambulatory Visit: Payer: Self-pay | Admitting: Family Medicine

## 2021-10-03 ENCOUNTER — Telehealth: Payer: Self-pay | Admitting: *Deleted

## 2021-10-03 DIAGNOSIS — E785 Hyperlipidemia, unspecified: Secondary | ICD-10-CM

## 2021-10-03 DIAGNOSIS — F419 Anxiety disorder, unspecified: Secondary | ICD-10-CM

## 2021-10-03 DIAGNOSIS — I10 Essential (primary) hypertension: Secondary | ICD-10-CM

## 2021-10-03 NOTE — Telephone Encounter (Signed)
Prior Beth Shepard was already approved under a different key.  ? ?PA approved. Effective 09/25/2021 to 09/25/2024 ?

## 2021-10-03 NOTE — Telephone Encounter (Signed)
Requesting: Alprazolam ?Contract: 04/05/17  ?UDS:  ?Last Visit: 09/18/21 ?Next Visit: 12/18/21 ?Last Refill: 07/24/21 ? ?Please Advise ? ?

## 2021-10-03 NOTE — Telephone Encounter (Signed)
Requesting: Xanax ?Contract: 2018 ?UDS: 2018 ?Last OV: 09/18/2021 ?Next OV: 12/18/2021 ?Last Refill: 07/24/2021, #18--2 RF ?Database: ? ? ?Please advise  ? ?

## 2021-10-03 NOTE — Telephone Encounter (Signed)
Covermymeds stated PA cannot be processed for member plan submitted for ozempic. Barnett Applebaum advised clinical to reach out to pt or pharmacy to gather BIN/PCN and group rx from pt. Telephone [phone], key: [id number]. Please advise.  ?

## 2021-10-03 NOTE — Telephone Encounter (Signed)
Medication: ALPRAZolam (XANAX) 0.25 MG tablet ? ?Has the patient contacted their pharmacy? No. ? ? ?Preferred Pharmacy:  ?[id number] [place], [place] - [address]  [address] [id number] ?Phone:  [phone] Fax:  [phone]?

## 2021-10-03 NOTE — Telephone Encounter (Signed)
Spoke with patient and advised that we have her only on Ozempic and Amaryl.  Patient has both and will make sure to take them.  She has asked why we stopped her alprazolam and I advised that we did not.  Another phone note started for request of medication. ?

## 2021-10-04 ENCOUNTER — Other Ambulatory Visit: Payer: Self-pay | Admitting: Family Medicine

## 2021-10-04 DIAGNOSIS — I10 Essential (primary) hypertension: Secondary | ICD-10-CM

## 2021-10-04 DIAGNOSIS — E785 Hyperlipidemia, unspecified: Secondary | ICD-10-CM

## 2021-10-04 DIAGNOSIS — F419 Anxiety disorder, unspecified: Secondary | ICD-10-CM

## 2021-10-04 MED ORDER — ALPRAZOLAM 0.25 MG PO TABS: 0.2500 mg | ORAL_TABLET | Freq: Three times a day (TID) | ORAL | 0 refills | Status: DC | PRN

## 2021-10-11 ENCOUNTER — Telehealth: Payer: Self-pay | Admitting: Pharmacist

## 2021-10-11 DIAGNOSIS — B370 Candidal stomatitis: Secondary | ICD-10-CM

## 2021-10-11 NOTE — Telephone Encounter (Signed)
Called patient per Teams message from Marzetta Board that she needs to rescheduled appt with Clinical Pharmacist Practitioner for 10/13/2021. Rescheduled appointment for 10/27/2021.  ? ?Patient also asked about a [facility] she was [facility] to [facility]. I didn't really see any mention of this but there was a prescription for Magic Mouthwash from 09/19/2021 but it looks like it was printed. Patient does not recall getting a printed Rx.  ?Is Magic Mouthwash what she was to try for dry mouth? If so, can I call into pharmacy?f ?

## 2021-10-12 MED ORDER — MAGIC MOUTHWASH: ORAL | 1 refills | Status: DC

## 2021-10-12 NOTE — Telephone Encounter (Signed)
Prescription signed by PCP and [facility] [place].  ?

## 2021-10-12 NOTE — Telephone Encounter (Signed)
Rx printed - will forward to PCP to sign and then fax to [facility] [place] (fax [phone]) ?

## 2021-10-13 ENCOUNTER — Telehealth: Payer: No Typology Code available for payment source

## 2021-10-24 ENCOUNTER — Telehealth: Payer: Self-pay | Admitting: Family Medicine

## 2021-10-24 NOTE — Telephone Encounter (Signed)
Patient states she has been having numbness in her hand and arms, nothing too bad. She noticed that since started her diabetes shots it seems to be getting worse. She would like to know if this is related or not. Advised pt to schedule OV but she stated she would like to see what Dr. Etter Sjogren says. Please advise.  ?

## 2021-10-27 ENCOUNTER — Telehealth: Payer: No Typology Code available for payment source

## 2021-10-27 NOTE — Telephone Encounter (Signed)
Called Pt LVM ?

## 2021-10-27 NOTE — Telephone Encounter (Signed)
Patient returned phone call  ? ?Please call back  ?

## 2021-10-27 NOTE — Telephone Encounter (Signed)
Called Pt and pt was advised to try give shot in the leg area .Call office back if the numbness does  ?Go away to make appt ?

## 2021-10-30 ENCOUNTER — Telehealth: Payer: Self-pay | Admitting: Family Medicine

## 2021-10-30 NOTE — Telephone Encounter (Signed)
Patient states her pen for her diabetes shot is not working and needs advise on what to do. She states she hasn't been able to take her diabetes medication. Please advise.  ?

## 2021-10-30 NOTE — Telephone Encounter (Signed)
Patient would like for someone to call back to discuss diabetes shot  ? ?Please advise   ?

## 2021-10-31 ENCOUNTER — Other Ambulatory Visit: Payer: Self-pay | Admitting: *Deleted

## 2021-10-31 DIAGNOSIS — E1165 Type 2 diabetes mellitus with hyperglycemia: Secondary | ICD-10-CM

## 2021-10-31 MED ORDER — OZEMPIC (0.25 OR 0.5 MG/DOSE) 2 MG/1.5ML ~~LOC~~ SOPN: 0.5000 mg | PEN_INJECTOR | SUBCUTANEOUS | 0 refills | Status: DC

## 2021-10-31 NOTE — Telephone Encounter (Signed)
Spoke patient and I asked how many time had he used the pen and she stated she had 5 doses.  Usually a pen only has 4.  I advised her that her pen is most likely out and she needs a refill. [facility] [place], [place]. She had stated that she needed lancets for the device to check her sugar.  I asked what machine did she end up getting and she stated that she did not know.  She also stated that her finger hurts when she pricked it.  Advised her to maybe lower her depth on the lancet device.  She did not understand what I was speaking about and I advised her that we will be happy to show her how to use her machine here or she can go to the pharmacy, because she also had other questions about the machine.  She will go to the pharmacy for help.  Also I notice we needed an appointment so appt was made for 12/22/21.  ?

## 2021-10-31 NOTE — Telephone Encounter (Signed)
Patient would like update.  ? ?Patient wants to know if glucose kits can be sent to finish off the next 2 shots  ? ?Please advise. ?

## 2021-10-31 NOTE — Telephone Encounter (Signed)
Attempted to talk to patient about her options since her Ozempic pen wasn't working but patient wasn't understanding. I asked Lawson Fiscal to help clarify with the patient her options.  ?

## 2021-11-01 ENCOUNTER — Telehealth: Payer: Self-pay | Admitting: Pharmacist

## 2021-11-01 ENCOUNTER — Telehealth: Payer: Self-pay

## 2021-11-01 ENCOUNTER — Other Ambulatory Visit: Payer: Self-pay | Admitting: *Deleted

## 2021-11-01 MED ORDER — ONETOUCH DELICA LANCETS 33G MISC: 1 refills | Status: DC

## 2021-11-01 NOTE — Telephone Encounter (Signed)
Caller Name Beth Shepard ?Caller Phone Number [phone] ?Call Type Message Only Information Provided ?Reason for Call Returning a Call from the Office ?Initial Comment Caller missed call from the office. ?Additional Comment Caller will call again during regular business hours. ?Disp. Time Disposition Final User ?10/31/2021 6:50:15 PM General Information Provided Yes [name] ?Call Closed By: [name] ?Transaction Date/Time: 10/31/2021 6:46:28 PM (ET) ?

## 2021-11-01 NOTE — Telephone Encounter (Signed)
Noted  

## 2021-11-01 NOTE — Chronic Care Management (AMB) (Signed)
? ? ?Chronic Care Management ?Pharmacy Assistant  ? ?Name: Beth Shepard  MRN: [id number] DOB: 11-Feb-1956 ? ?Beth Shepard is an 66 y.o. year old female who presents for his initial CCM visit with the clinical pharmacist. ? ?Recent office visits:  ?09/18/21-Yvonne R. Carollee Herter, DO (PCP) Seen for memory loss. AMB Referral to [facility]. Start on Januvia 10m. Follow up in 3 months.  ?09/08/21-Yvonne R. LCarollee Herter DO (PCP) Seen for a sore throat. Ambulatory referral to Endocrinology. Labs ordered. Ambulatory referral to Neuropsychology. Follow up in 6 months.  ?07/06/21-Jessica C. Copland, MD. Seen for a cough. Start on amoxicillin 500 mg.  ?05/25/21-Yvonne R. LCarollee Herter DO (PCP, Video visit) General follow up visit. Start on doxycycline 1011mand fluticasone 50 mcg nasal spray. Follow up in 1-2 weeks.  ? ?Recent consult visits:  ?None noted ? ?Hospital visits:  ?None in previous 6 months ? ?Medications: ?Outpatient Encounter Medications as of 11/01/2021  ?Medication Sig  ? albuterol (PROAIR HFA) 108 (90 Base) MCG/ACT inhaler INHALE 2 PUFFS INTO THE LUNGS EVERY 6 HOURS AS NEEDED FOR WHEEZE  ? ALPRAZolam (XANAX) 0.25 MG tablet Take 1 tablet (0.25 mg total) by mouth 3 (three) times daily as needed.  ? aspirin 81 MG tablet Take 81 mg by mouth daily.  ? blood glucose meter kit and supplies KIT Dispense based on patient and insurance preference. Use up to four times daily as directed.  ? Calcium Carb-Cholecalciferol (CALCIUM-VITAMIN D) 500-200 MG-UNIT tablet Take by mouth.  ? carvedilol (COREG) 12.5 MG tablet Take 1 tablet (12.5 mg total) by mouth 2 (two) times daily.  ? dicyclomine (BENTYL) 10 MG capsule Take 1 capsule (10 mg total) by mouth 4 (four) times daily -  before meals and at bedtime.  ? fluticasone (FLONASE) 50 MCG/ACT nasal spray Place 2 sprays into both nostrils daily.  ? furosemide (LASIX) 20 MG tablet Take 1 tablet (20 mg total) by mouth daily.  ? gabapentin (NEURONTIN) 100 MG capsule  TAKE 1 CAPSULE BY MOUTH THREE TIMES A DAY  ? glimepiride (AMARYL) 4 MG tablet TAKE 1 TABLET BY MOUTH EVERY DAY BEFORE BREAKFAST  ? glucose blood test strip 1 each by Other route as needed. Reported on 11/02/2015  ? glycopyrrolate (ROBINUL) 2 MG tablet Take 1 tablet (2 mg total) by mouth 2 (two) times daily. FOR IBS  ? hyoscyamine (LEVSIN SL) 0.125 MG SL tablet Place 1 tablet (0.125 mg total) under the tongue every 4 (four) hours as needed for cramping.  ? lisinopril (ZESTRIL) 20 MG tablet TAKE 1 TABLET BY MOUTH EVERY DAY  ? magic mouthwash SOLN Swish and spit 4 times a day  ? omega-3 acid ethyl esters (LOVAZA) 1 g capsule Take 2 g by mouth.  ? ondansetron (ZOFRAN) 4 MG tablet TAKE 1-2 TABLETS BY MOUTH EVERY 8 HOURS AS NEEDED FOR NAUSEA  ? pantoprazole (PROTONIX) 40 MG tablet Take 1 tablet (40 mg total) by mouth in the morning. ** CALL OFFICE TO SCHEDULE APPOINTMENT  ? rosuvastatin (CRESTOR) 10 MG tablet Take 1 tablet (10 mg total) by mouth at bedtime.  ? Semaglutide,0.25 or 0.5MG/DOS, (OZEMPIC, 0.25 OR 0.5 MG/DOSE,) 2 MG/1.5ML SOPN Inject 0.5 mg into the skin once a week.  ? spironolactone (ALDACTONE) 25 MG tablet Take 1 tablet (25 mg total) by mouth every other day.  ? traMADol (ULTRAM) 50 MG tablet Take 1 tablet (50 mg total) by mouth 3 (three) times daily as needed.  ? ?No facility-administered encounter medications on file as  of 11/01/2021.  ? ?Albuterol (PROAIR HFA) 108 (90 Base) MCG/ACT inhaler Last filled:07/06/21 25 DS ?ALPRAZolam (XANAX) 0.25 MG tablet Last filled:10/04/21 30 DS ?Aspirin 81 MG tablet Last filled:None noted ?Calcium Carb-Cholecalciferol (CALCIUM-VITAMIN D) 500-200 MG-UNIT tablet Last filled:None noted ?Carvedilol (COREG) 12.5 MG tablet Last filled:03/12/21 90 DS ?Dicyclomine (BENTYL) 10 MG capsule Last filled:04/11/20 90 DS ?Fluticasone (FLONASE) 50 MCG/ACT nasal spray Last filled:09/27/21 90 DS ?Furosemide (LASIX) 20 MG tablet Last filled:09/08/21 90 DS ?Gabapentin (NEURONTIN) 100 MG capsule  Last filled:10/06/19 90 DS ?Glimepiride (AMARYL) 4 MG tablet Last filled:09/28/21 90 DS ?Glycopyrrolate (ROBINUL) 2 MG tablet Last filled:05/11/20 90 DS ?Hyoscyamine (LEVSIN SL) 0.125 MG SL tablet Last filled:06/08/20 5 DS ?Lisinopril (ZESTRIL) 20 MG tablet Last filled:12/14/20 90 DS ?Magic mouthwash SOLN Last filled::None noted ?Omega-3 acid ethyl esters (LOVAZA) 1 g capsule Last filled:None noted ?Ondansetron (ZOFRAN) 4 MG tablet Last filled:06/08/20 6 DS ?Pantoprazole (PROTONIX) 40 MG tablet Last filled:05/17/21 90 DS ?Rosuvastatin (CRESTOR) 10 MG tablet Last filled:10/31/20 90 DS ?Semaglutide,0.25 or 0.5MG/DOS, (OZEMPIC, 0.25 OR 0.5 MG/DOSE,) 2 MG/1.5ML SOPN Last filled:09/27/21 28 DS ?Spironolactone (ALDACTONE) 25 MG tablet Last filled:09/08/21 90 DS ?TraMADol (ULTRAM) 50 MG tablet Last filled:09/21/20 10 DS ? ? ?Care Gaps: ?Zoster Vaccines:Never done ?DEXA SCAN:Never done ?MAMMOGRAM:Last completed: Dec 13, 2017 ?COLONOSCOPY:Last completed: Feb 18, 2019 ?Pneumonia Vaccine:Last completed: Jan 29, 2020 ? ?Star Rating Drugs: ?Lisinopril (ZESTRIL) 20 MG tablet Last filled:12/14/20 90 DS ?Rosuvastatin (CRESTOR) 10 MG tablet Last filled:10/31/20 90 DS ?Semaglutide,0.25 or 0.5MG/DOS, (OZEMPIC, 0.25 OR 0.5 MG/DOSE,) 2 MG/1.5ML SOPN Last filled:09/27/21 28 DS ? ?Corrie Mckusick, RMA ?Health Concierge ? ?

## 2021-11-03 ENCOUNTER — Ambulatory Visit: Payer: No Typology Code available for payment source | Admitting: Pharmacist

## 2021-11-03 DIAGNOSIS — I429 Cardiomyopathy, unspecified: Secondary | ICD-10-CM

## 2021-11-03 DIAGNOSIS — E785 Hyperlipidemia, unspecified: Secondary | ICD-10-CM

## 2021-11-03 DIAGNOSIS — I10 Essential (primary) hypertension: Secondary | ICD-10-CM

## 2021-11-03 MED ORDER — CARVEDILOL 12.5 MG PO TABS: 12.5000 mg | ORAL_TABLET | Freq: Two times a day (BID) | ORAL | 1 refills | Status: DC

## 2021-11-03 MED ORDER — GLUCOSE BLOOD VI STRP: ORAL_STRIP | 3 refills | Status: DC

## 2021-11-03 MED ORDER — ROSUVASTATIN CALCIUM 10 MG PO TABS: 10.0000 mg | ORAL_TABLET | Freq: Every day | ORAL | 1 refills | Status: DC

## 2021-11-03 MED ORDER — LISINOPRIL 20 MG PO TABS: 20.0000 mg | ORAL_TABLET | Freq: Every day | ORAL | 1 refills | Status: DC

## 2021-11-03 NOTE — Chronic Care Management (AMB) (Signed)
Care Management   Pharmacy Note  11/07/2021 Name: Beth Shepard MRN: [id number] DOB: 06-23-1956  Subjective: Beth Shepard is a 66 y.o. year old female who is a primary care patient of Ann Held, DO. The Care Management team was consulted for assistance with care management and care coordination needs.    Engaged with patient by telephone for initial visit in response to provider referral for pharmacy case management and/or care coordination services.   The patient was given information about Care Management services today including:  Care Management services includes personalized support from designated clinical staff supervised by the patient's primary care provider, including individualized plan of care and coordination with other care providers. 24/7 contact phone numbers for assistance for urgent and routine care needs. The patient may stop case management services at any time by phone call to the office staff.  Patient agreed to services and consent obtained.  Patient was referred for medication management and assistance with type 2 DM, hypertension and other chronic conditions. Type 2 DM: Last A1c was 12.4%. Patient was started on Ozempic 0.52m weekly for 4 weeks, then increase to 0.513mweekly. Initially required prior authorization. Ozempic has been approved 09/25/2021 to 09/25/2021. Today was able to verify with her insurance that cost will be $0. She is also taking glimepiride 2m67maily. Patient is still working and has [employer] that she received her new glucometer but she is not using yet. She is having difficulty using the fingerstick glucometer due to neuropathy in her hands.  Patient reports she does not eat much - sugar free popsicles, jello; low sugar yogurt for breakfast.   Hypertension / cardiomyopathy / chronic venous insufficiency: Last office blood pressure was at goal. She is taking  lisinopril, carvedilol, furosemide and spironolactone. She is not checking blood pressure at home but does have a blood pressure monitor.   Hyperlipidemia: LDL goal is < 70 and TG < 150. Patient is not at goal per last lipid panel. She states she was forgetting to take rosuvastatin because was trying to take at night. She was switch to taking rosuvastatin in the morning and reports improved adherence.    Memory changes: patient has noticed some difficulty remembering lately. Her daughters are concerned. Patient has referral for evaluation by neuropsychiatry. She expresses that her daughters are trying to help her and she is appreciative but she is afraid that she will be left out of decisions and this concerning her.    Assessment:  Review of patient status, including review of consultants reports, laboratory and other test data, was performed as part of comprehensive evaluation and provision of chronic care management services.   SDOH (Social Determinants of Health) assessments and interventions performed:  SDOH Interventions    Flowsheet Row Most Recent Value  SDOH Interventions   Financial Strain Interventions Intervention Not Indicated        Objective:  Lab Results  Component Value Date   CREATININE 1.17 09/11/2021   CREATININE 1.14 07/06/2021   CREATININE 1.50 (H) 01/29/2020    Lab Results  Component Value Date   HGBA1C 12.4 (H) 09/11/2021       Component Value Date/Time   CHOL 228 (H) 09/11/2021 1321   TRIG 271.0 (H) 09/11/2021 1321   HDL 46.00 09/11/2021 1321   CHOLHDL 5 09/11/2021 1321   VLDL 54.2 (H) 09/11/2021 1321   LDLCALC 115 (H) 04/06/2016 1243   LDLDIRECT 148.0 09/11/2021 1321    Clinical  ASCVD: No  The 10-year ASCVD risk score (Arnett DK, et al., 2019) is: 20.7%   Values used to calculate the score:     Age: 64 years     Sex: Female     Is Non-Hispanic African American: No     Diabetic: Yes     Tobacco smoker: No     Systolic Blood Pressure: 854  mmHg     Is BP treated: Yes     HDL Cholesterol: 46 mg/dL     Total Cholesterol: 228 mg/dL     BP Readings from Last 3 Encounters:  09/18/21 128/78  09/08/21 118/80  07/06/21 134/78    Care Plan  Allergies  Allergen Reactions   Prednisone Palpitations and Other (See Comments)    Dose pack causes vomiting; Causes Gerd N/v/d N/v/d    Codeine Nausea Only and Palpitations    REACTION: palpitation  REACTION: palpitation     Ibuprofen Other (See Comments) and Rash    REACTION: questionable. Pt states that she can take this sometimes. GERD    Medications Reviewed Today     Reviewed by Cherre Robins, RPH-CPP (Pharmacist) on 11/03/21 at 1449  Med List Status: <None>   Medication Order Taking? Sig Documenting Provider Last Dose Status Informant  albuterol (PROAIR HFA) 108 (90 Base) MCG/ACT inhaler [id number] Yes INHALE 2 PUFFS INTO THE LUNGS EVERY 6 HOURS AS NEEDED FOR WHEEZE Copland, Gay Filler, MD Taking Active   ALPRAZolam (XANAX) 0.25 MG tablet [id number] Yes Take 1 tablet (0.25 mg total) by mouth 3 (three) times daily as needed. Ann Held, DO Taking Active            Med Note Antony Contras, Kyona Chauncey B   Fri Nov 03, 2021  2:37 PM) Usually takes 0.5 tablet  aspirin 81 MG tablet [id number] Yes Take 81 mg by mouth daily. [provider] Taking Active Self  blood glucose meter kit and supplies KIT [id number] No Dispense based on patient and insurance preference. Use up to four times daily as directed.  Patient not taking: Reported on 11/03/2021   Ann Held, DO Not Taking Active   Calcium Carb-Cholecalciferol (CALCIUM-VITAMIN D) 500-200 MG-UNIT tablet [id number] Yes Take 1 tablet by mouth daily. [provider] Taking Active   carvedilol (COREG) 12.5 MG tablet [id number] Yes Take 1 tablet (12.5 mg total) by mouth 2 (two) times daily. Roma Schanz R, DO Taking Active   fluticasone (FLONASE) 50 MCG/ACT nasal spray [id number]  Place 2 sprays into both  nostrils daily. Roma Schanz R, DO  Active   furosemide (LASIX) 20 MG tablet [id number] Yes Take 1 tablet (20 mg total) by mouth daily. Carollee Herter, Kendrick Fries R, DO Taking Active   gabapentin (NEURONTIN) 100 MG capsule [id number]  TAKE 1 CAPSULE BY MOUTH THREE TIMES A DAY Ann Held, DO  Active   glimepiride (AMARYL) 4 MG tablet [id number] Yes TAKE 1 TABLET BY MOUTH EVERY DAY BEFORE BREAKFAST Roma Schanz R, DO Taking Active   glucose blood test strip [id number] No 1 each by Other route as needed. Reported on 11/02/2015  Patient not taking: Reported on 11/03/2021   [provider] Not Taking Active Self  lisinopril (ZESTRIL) 20 MG tablet [id number] No TAKE 1 TABLET BY MOUTH EVERY DAY  Patient not taking: Reported on 11/03/2021   Ann Held, DO Not Taking Active   magic mouthwash SOLN [id number] Yes Swish and spit 4 times a day Carollee Herter, [place]  R, DO Taking Active   OneTouch Delica Lancets 41P MISC [id number] No Use to check sugar 4 times a day.  Dx code E11.9  Patient not taking: Reported on 11/03/2021   Ann Held, DO Not Taking Active   pantoprazole (PROTONIX) 40 MG tablet [id number] Yes Take 1 tablet (40 mg total) by mouth in the morning. ** CALL OFFICE TO SCHEDULE APPOINTMENT [place], Amy S, PA-C Taking Active   rosuvastatin (CRESTOR) 10 MG tablet [id number] Yes Take 1 tablet (10 mg total) by mouth at bedtime.  Patient taking differently: Take 10 mg by mouth daily.   Roma Schanz R, DO Taking Active   Semaglutide,0.25 or 0.5MG/DOS, (OZEMPIC, 0.25 OR 0.5 MG/DOSE,) 2 MG/1.5ML SOPN [id number] Yes Inject 0.5 mg into the skin once a week. Roma Schanz R, DO Taking Active   spironolactone (ALDACTONE) 25 MG tablet [id number] Yes Take 1 tablet (25 mg total) by mouth every other day. Ann Held, DO Taking Active             Patient Active Problem List   Diagnosis Date Noted   Hyperlipidemia associated with type 2 diabetes  mellitus ([place]) 09/18/2021   Memory loss 09/08/2021   Thrush 09/08/2021   IBS (irritable bowel syndrome) 05/11/2020   Uncontrolled type 2 diabetes mellitus with hyperglycemia ([place]) 02/01/2020   Nausea without vomiting 01/27/2019   Preventative health care 12/18/2017   Infected sebaceous cyst of skin 07/19/2017   DCM (dilated cardiomyopathy) ([place]) 07/10/2017   Fibromyalgia 04/05/2017   Chronic pain syndrome 04/05/2017   Pain in both lower extremities 02/06/2017   Chronic venous insufficiency 02/06/2017   Cellulitis of lower extremity 08/28/2016   Pain in right ankle and joints of right foot 08/09/2016   Peripheral edema 07/10/2016   Trochanteric bursitis, right hip 05/03/2016   Chronic kidney disease (CKD) stage G3a/A1, moderately decreased glomerular filtration rate (GFR) between 45-59 mL/min/1.73 square meter and albuminuria creatinine ratio less than 30 mg/g ([place]) 04/07/2016   Primary osteoarthritis of right hip 10/24/2015   Hip joint replacement status 10/24/2015   Chest pain 08/25/2015   Right hip pain 08/25/2015   Chest pain, precordial 08/10/2015   Precordial pain 08/10/2015   Cardiomyopathy ([place]) 01/18/2015   Block, bundle branch, left 01/18/2015   Left bundle-branch block 01/18/2015   Diabetes ([place]) 01/18/2015   Nausea alone 12/17/2013   Diarrhea 11/19/2013   Bloating 11/19/2013   Obesity (BMI 30-39.9) 10/21/2013   Multiple joint pain 11/13/2012   MYALGIA 08/29/2010   ABDOMINAL PAIN OTHER SPECIFIED SITE 08/22/2009   OSTEOARTHROS UNSPEC WHETHER GEN/LOC UNSPEC SITE 03/17/2009   Cellulitis and abscess of face 12/17/2008   Disturbance of skin sensation 12/17/2008   Bronchitis 05/07/2008   Anxiety 12/04/2007   SINUSITIS, ACUTE NOS 12/19/2006   Diabetes mellitus, type II ([place]) 10/08/2006   Hyperlipidemia LDL goal <70 10/08/2006   Essential hypertension 10/08/2006   COPD (chronic obstructive pulmonary disease) ([place]) 10/08/2006   GERD 10/08/2006   Other postprocedural  status(V45.89) 10/08/2006   Medication Assistance:  None required.  Patient affirms current coverage meets needs.   Assessment:  Type 2 DM: not at A1c goal of < 7.0% Hypertension: at goal of < 140/90 Cardiomyopathy: on GDT Memory changes Neuropathy   Plan: Continue Ozempic 0.56m weekly and glimepiride 426mdaily. If additional agents needed in future consider SGLT2 due to cardiomyopathy and venous insufficiency.  Checked insurance coverage of Continuous Glucose Monitor . DexCom 6 or 7 is preferred by her plan. Checked  with patient's daughter regarding iphone and patient has older generation but does has an ios that will allow her to use DexCom. Rx for Dexcom 7 sensors sent to her pharmacy. Will make appointment to face to face instruction in 2 weeks.  Reviewed home blood glucose goals  Fasting blood glucose goal (before meals) = 80 to 130 Blood glucose goal after a meal = less than 180  Discussed diabetic diet - recommended eating a variety of foods Increase non-starchy vegetables - carrots, green bean, squash, zucchini, tomatoes, onions, peppers, spinach and other green leafy vegetables, cabbage, lettuce, cucumbers, asparagus, okra (not fried), eggplant Limit sugar and processed foods (cakes, cookies, ice cream, crackers and chips) Eat fresh fruit but limit serving sizes 1/2 cup or about the size of tennis or baseball Limit red meat to no more than 1-2 times per week (serving size about the size of your palm) Choose whole grains / lean proteins - whole wheat bread, quinoa, whole grain rice (1/2 cup), fish, chicken, [place] Avoid sugar and calorie containing beverages - soda, sweet tea and juice.  Choose water or unsweetened tea instead.  Check blood pressure 2 to 3 times per week. Continue current medications for hypertension / heart Discussed LDL and Tg goals. Recommended continue to take rosuvastatin daily in the morning. Recheck lipids in 1 to 2 months.    Follow Up:  Patient agrees to  Care Plan and Follow-up.  Plan: Face to Face appointment with care management team member scheduled for: 2 weeks   Cherre Robins, PharmD Clinical Pharmacist [place] [facility]

## 2021-11-06 ENCOUNTER — Telehealth: Payer: Self-pay | Admitting: Family Medicine

## 2021-11-06 MED ORDER — DEXCOM G7 SENSOR MISC: 5 refills | Status: DC

## 2021-11-06 NOTE — Telephone Encounter (Signed)
Pt's daughter states Tammy tried reaching out regarding her mom and she was just returning the phone call  ?

## 2021-11-06 NOTE — Telephone Encounter (Signed)
Daughter states that she downloaded DexCom 7 app on patient's phone. [facility] Was told by [facility] representative that cost should be $0. Will check back tomorrow and make sure not problems / prior authorization needed. Then will set up appointment with patient to provide education on DexCom use.  ?

## 2021-11-07 MED ORDER — DEXCOM G7 RECEIVER DEVI: 0 refills | Status: DC

## 2021-11-07 NOTE — Telephone Encounter (Signed)
Confirmed with [facility] that DexCom 7 sensors were covered $0 copay. Also sent in Rx for reader / receiver. Set up appointment 11/15/2021 for education on how to use DexCom ?

## 2021-11-07 NOTE — Patient Instructions (Signed)
Beth Shepard,  ?It was a pleasure speaking with you  ?Below is a summary of your health goals and care plan ? ?Diabetes:  ?Goal - A1c < 7.0% ?Lab Results  ?Component Value Date  ? HGBA1C 12.4 (H) 09/11/2021  ? ?Home blood glucose goals  ?Fasting blood glucose goal (before meals) = 80 to 130 ?Blood glucose goal after a meal = less than 180  ?Continue Ozempic 0.'5mg'$  weekly and glimepiride '4mg'$  daily.  ?Checked insurance coverage of Continuous Glucose Monitor . DexCom 6 or 7 is preferred by your plan. Your daughter has downloaded the program needed to use your phone to check your blood glucose while using the DexCom Continuous Glucose Monitor. I have sent in a prescription for the DexCom 7. We will meet face to face to discuss how to use the DexCom.  ? ?Discussed diabetic diet - recommended eating a variety of foods ?Increase non-starchy vegetables - carrots, green bean, squash, zucchini, tomatoes, onions, peppers, spinach and other green leafy vegetables, cabbage, lettuce, cucumbers, asparagus, okra (not fried), eggplant ?Limit sugar and processed foods (cakes, cookies, ice cream, crackers and chips) ?Eat fresh fruit but limit serving sizes 1/2 cup or about the size of tennis or baseball ?Limit red meat to no more than 1-2 times per week (serving size about the size of your palm) ?Choose whole grains / lean proteins - whole wheat bread, quinoa, whole grain rice (1/2 cup), fish, chicken, [place] ?Avoid sugar and calorie containing beverages - soda, sweet tea and juice.  Choose water or unsweetened tea instead.  ? ?Hypertension / Heart disease:  ?BP Readings from Last 3 Encounters:  ?09/18/21 128/78  ?09/08/21 118/80  ?07/06/21 134/78  ? ?Check blood pressure 2 to 3 times per week. Continue current medications for hypertension / heart ? ?Elevated cholesterol / heart health: ?Lab Results  ?Component Value Date  ? CHOL 228 (H) 09/11/2021  ? HDL 46.00 09/11/2021  ? LDLCALC 115 (H) 04/06/2016  ? LDLDIRECT 148.0 09/11/2021  ? TRIG  271.0 (H) 09/11/2021  ? CHOLHDL 5 09/11/2021  ? ?Discussed LDL goal is < 70 and Tg goal is < 150.  ?Continue to take rosuvastatin daily in the morning.  ?Recheck lipids in 1 to 2 months.  ? ? ?If you have any questions or concerns, please feel free to contact me either at the phone number below or with a MyChart message.  ? ? ?Cherre Robins, PharmD ?Clinical Pharmacist ?[facility] ?[place] ?[phone] (direct line)  ?[phone] (main office number) ? ? ?The patient verbalized understanding of instructions, educational materials, and care plan provided today and agreed to receive a mailed copy of patient instructions, educational materials, and care plan.   ? ?

## 2021-11-15 ENCOUNTER — Ambulatory Visit: Payer: No Typology Code available for payment source | Admitting: Pharmacist

## 2021-11-15 ENCOUNTER — Telehealth: Payer: Self-pay | Admitting: Pharmacist

## 2021-11-15 VITALS — Wt 168.0 lb

## 2021-11-15 DIAGNOSIS — E1151 Type 2 diabetes mellitus with diabetic peripheral angiopathy without gangrene: Secondary | ICD-10-CM

## 2021-11-15 MED ORDER — GLIMEPIRIDE 4 MG PO TABS: 2.0000 mg | ORAL_TABLET | Freq: Every day | ORAL | 3 refills | Status: DC

## 2021-11-15 NOTE — Patient Instructions (Signed)
Beth Shepard It was a pleasure seeing you today. How to Treat a Low Glucose Level:  If you have a low blood glucose less than 70, please eat / drink 15 grams of carbohydrates (4 oz of juice, soda, 4 glucose tablets, or 3-4 pieces of hard candy).  It is best so choose a "quick" source of sugar that does no contain fat (chocolate and peanut butter might take longer to increase your blood glucose) Wait 15 minutes and then recheck your blood glucose. If your blood glucose is still less than 70, eat another 15 grams of carbohydrates.  Wait another 15 minutes and recheck your glucose.  Continue this until your blood glucose is over 70. Once you blood glucose is over 70, eat a snack with protein in it to prevent your blood glucose from dropping again.  If you have any questions or concerns, please feel free to contact me either at the phone number below or with a MyChart message.   Keep up the good work!  Cherre Robins, PharmD Clinical Pharmacist [facility] [phone] (direct line)  [phone] (main office number)

## 2021-11-15 NOTE — Chronic Care Management (AMB) (Signed)
Care Management   Pharmacy Note  11/15/2021 Name: Beth Shepard MRN: [id number] DOB: 10/11/1955  Subjective: [name] is a 66 y.o. year old female who is a primary care patient of Lowne Chase, Yvonne R, DO. The Care Management team was consulted for assistance with care management and care coordination needs.    Engaged with patient by telephone for initial visit in response to provider referral for pharmacy case management and/or care coordination services.   The patient was given information about Care Management services today including:  Care Management services includes personalized support from designated clinical staff supervised by the patient's primary care provider, including individualized plan of care and coordination with other care providers. 24/7 contact phone numbers for assistance for urgent and routine care needs. The patient may stop case management services at any time by phone call to the office staff.  Patient agreed to services and consent obtained.  Patient was referred for medication management and assistance with type 2 DM, hypertension and other chronic conditions. Type 2 DM: Last A1c was 12.4%. Patient is taking Ozempic 0.5mg weekly. Prior authorization approved 09/25/2021 to 09/25/2021. She has changed diet and has lost a significant amount of weight but also could be from hyperglycemia.  Today we started DexCom 7 Continuous Glucose Monitor in the office.  Hypertension / cardiomyopathy / chronic venous insufficiency: Last office blood pressure was at goal. She is taking lisinopril, carvedilol, furosemide and spironolactone. She is not checking blood pressure at home but does have a blood pressure monitor.   Hyperlipidemia: LDL goal is < 70 and TG < 150. Patient is not at goal per last lipid panel. She states she was forgetting to take rosuvastatin because was trying to take at night. She was switch to taking rosuvastatin in the morning and reports improved  adherence.    Memory changes: patient has noticed some difficulty remembering lately. Her daughters are concerned. Patient has referral for evaluation by neuropsychiatry. She expresses that her daughters are trying to help her and she is appreciative but she is afraid that she will be left out of decisions and this concerning her.    Assessment:  Review of patient status, including review of consultants reports, laboratory and other test data, was performed as part of comprehensive evaluation and provision of chronic care management services.   SDOH (Social Determinants of Health) assessments and interventions performed:      Objective:  Wt Readings from Last 3 Encounters:  11/15/21 168 lb (76.2 kg)  09/18/21 182 lb 3.2 oz (82.6 kg)  09/08/21 178 lb 3.2 oz (80.8 kg)     Lab Results  Component Value Date   CREATININE 1.17 09/11/2021   CREATININE 1.14 07/06/2021   CREATININE 1.50 (H) 01/29/2020    Lab Results  Component Value Date   HGBA1C 12.4 (H) 09/11/2021       Component Value Date/Time   CHOL 228 (H) 09/11/2021 1321   TRIG 271.0 (H) 09/11/2021 1321   HDL 46.00 09/11/2021 1321   CHOLHDL 5 09/11/2021 1321   VLDL 54.2 (H) 09/11/2021 1321   LDLCALC 115 (H) 04/06/2016 1243   LDLDIRECT 148.0 09/11/2021 1321    Clinical ASCVD: No  The 10-year ASCVD risk score (Arnett DK, et al., 2019) is: 20.7%   Values used to calculate the score:     Age: 66 years     Sex: Female     Is Non-Hispanic African American: No     Diabetic: Yes     Tobacco smoker:   No     Systolic Blood Pressure: 161 mmHg     Is BP treated: Yes     HDL Cholesterol: 46 mg/dL     Total Cholesterol: 228 mg/dL     BP Readings from Last 3 Encounters:  09/18/21 128/78  09/08/21 118/80  07/06/21 134/78    Care Plan  Allergies  Allergen Reactions   Prednisone Palpitations and Other (See Comments)    Dose pack causes vomiting; Causes Gerd N/v/d N/v/d    Codeine Nausea Only and Palpitations     REACTION: palpitation  REACTION: palpitation     Ibuprofen Other (See Comments) and Rash    REACTION: questionable. Pt states that she can take this sometimes. GERD    Medications Reviewed Today     Reviewed by Cherre Robins, RPH-CPP (Pharmacist) on 11/03/21 at 1449  Med List Status: <None>   Medication Order Taking? Sig Documenting Provider Last Dose Status Informant  albuterol (PROAIR HFA) 108 (90 Base) MCG/ACT inhaler [id number] Yes INHALE 2 PUFFS INTO THE LUNGS EVERY 6 HOURS AS NEEDED FOR WHEEZE Copland, Gay Filler, MD Taking Active   ALPRAZolam (XANAX) 0.25 MG tablet [id number] Yes Take 1 tablet (0.25 mg total) by mouth 3 (three) times daily as needed. Ann Held, DO Taking Active            Med Note Antony Contras, Pinkey Mcjunkin B   Fri Nov 03, 2021  2:37 PM) Usually takes 0.5 tablet  aspirin 81 MG tablet [id number] Yes Take 81 mg by mouth daily. [provider] Taking Active Self  blood glucose meter kit and supplies KIT [id number] No Dispense based on patient and insurance preference. Use up to four times daily as directed.  Patient not taking: Reported on 11/03/2021   Ann Held, DO Not Taking Active   Calcium Carb-Cholecalciferol (CALCIUM-VITAMIN D) 500-200 MG-UNIT tablet [id number] Yes Take 1 tablet by mouth daily. [provider] Taking Active   carvedilol (COREG) 12.5 MG tablet [id number] Yes Take 1 tablet (12.5 mg total) by mouth 2 (two) times daily. Roma Schanz R, DO Taking Active   fluticasone (FLONASE) 50 MCG/ACT nasal spray [id number]  Place 2 sprays into both nostrils daily. Roma Schanz R, DO  Active   furosemide (LASIX) 20 MG tablet [id number] Yes Take 1 tablet (20 mg total) by mouth daily. Carollee Herter, Kendrick Fries R, DO Taking Active   gabapentin (NEURONTIN) 100 MG capsule [id number]  TAKE 1 CAPSULE BY MOUTH THREE TIMES A DAY Ann Held, DO  Active   glimepiride (AMARYL) 4 MG tablet [id number] Yes TAKE 1 TABLET BY MOUTH EVERY DAY BEFORE  BREAKFAST Roma Schanz R, DO Taking Active   glucose blood test strip [id number] No 1 each by Other route as needed. Reported on 11/02/2015  Patient not taking: Reported on 11/03/2021   [provider] Not Taking Active Self  lisinopril (ZESTRIL) 20 MG tablet [id number] No TAKE 1 TABLET BY MOUTH EVERY DAY  Patient not taking: Reported on 11/03/2021   Ann Held, DO Not Taking Active   magic mouthwash SOLN [id number] Yes Swish and spit 4 times a day Carollee Herter, Alferd Apa, DO Taking Active   OneTouch Delica Lancets 62G MISC [id number] No Use to check sugar 4 times a day.  Dx code E11.9  Patient not taking: Reported on 11/03/2021   Ann Held, DO Not Taking Active   pantoprazole (PROTONIX) 40 MG tablet [id number] Yes Take 1 tablet (40 mg total)  by mouth in the morning. ** CALL OFFICE TO SCHEDULE APPOINTMENT Esterwood, Amy S, PA-C Taking Active   rosuvastatin (CRESTOR) 10 MG tablet [id number] Yes Take 1 tablet (10 mg total) by mouth at bedtime.  Patient taking differently: Take 10 mg by mouth daily.   Lowne Chase, Yvonne R, DO Taking Active   Semaglutide,0.25 or 0.5MG/DOS, (OZEMPIC, 0.25 OR 0.5 MG/DOSE,) 2 MG/1.5ML SOPN [id number] Yes Inject 0.5 mg into the skin once a week. Lowne Chase, Yvonne R, DO Taking Active   spironolactone (ALDACTONE) 25 MG tablet [id number] Yes Take 1 tablet (25 mg total) by mouth every other day. Lowne Chase, Yvonne R, DO Taking Active             Patient Active Problem List   Diagnosis Date Noted   Hyperlipidemia associated with type 2 diabetes mellitus (HCC) 09/18/2021   Memory loss 09/08/2021   Thrush 09/08/2021   IBS (irritable bowel syndrome) 05/11/2020   Uncontrolled type 2 diabetes mellitus with hyperglycemia (HCC) 02/01/2020   Nausea without vomiting 01/27/2019   Preventative health care 12/18/2017   Infected sebaceous cyst of skin 07/19/2017   DCM (dilated cardiomyopathy) (HCC) 07/10/2017   Fibromyalgia 04/05/2017   Chronic  pain syndrome 04/05/2017   Pain in both lower extremities 02/06/2017   Chronic venous insufficiency 02/06/2017   Cellulitis of lower extremity 08/28/2016   Pain in right ankle and joints of right foot 08/09/2016   Peripheral edema 07/10/2016   Trochanteric bursitis, right hip 05/03/2016   Chronic kidney disease (CKD) stage G3a/A1, moderately decreased glomerular filtration rate (GFR) between 45-59 mL/min/1.73 square meter and albuminuria creatinine ratio less than 30 mg/g (HCC) 04/07/2016   Primary osteoarthritis of right hip 10/24/2015   Hip joint replacement status 10/24/2015   Chest pain 08/25/2015   Right hip pain 08/25/2015   Chest pain, precordial 08/10/2015   Precordial pain 08/10/2015   Cardiomyopathy (HCC) 01/18/2015   Block, bundle branch, left 01/18/2015   Left bundle-branch block 01/18/2015   Diabetes (HCC) 01/18/2015   Nausea alone 12/17/2013   Diarrhea 11/19/2013   Bloating 11/19/2013   Obesity (BMI 30-39.9) 10/21/2013   Multiple joint pain 11/13/2012   MYALGIA 08/29/2010   ABDOMINAL PAIN OTHER SPECIFIED SITE 08/22/2009   OSTEOARTHROS UNSPEC WHETHER GEN/LOC UNSPEC SITE 03/17/2009   Cellulitis and abscess of face 12/17/2008   Disturbance of skin sensation 12/17/2008   Bronchitis 05/07/2008   Anxiety 12/04/2007   SINUSITIS, ACUTE NOS 12/19/2006   Diabetes mellitus, type II (HCC) 10/08/2006   Hyperlipidemia LDL goal <70 10/08/2006   Essential hypertension 10/08/2006   COPD (chronic obstructive pulmonary disease) (HCC) 10/08/2006   GERD 10/08/2006   Other postprocedural status(V45.89) 10/08/2006   Medication Assistance:  None required.  Patient affirms current coverage meets needs.   Assessment:  Type 2 DM: not at A1c goal of < 7.0% Hypertension: at goal of < 140/90 Cardiomyopathy: on GDT Memory changes Neuropathy   Plan: Continue Ozempic 0.5mg weekly and glimepiride 4mg daily. If continues to have blood glucose < 70 then patient is to call office and will  consider lowering dose or stopping glimepiride.  Today we started DexCom 7 Continuous Glucose Monitor in the office. After a 30 minute warm up patient's blood glucose was low at 60. Treated low in office. Treated with pink lemonade and small snack. Blood glucose begin to improved over the next 30 minutes.  Patient was taught site selection and preparation of site. Instruction how to place sensor and start with both her phone and   G7 reader - she can use both or either. Discussed placing extra cover over sensor. Education provided on how to read blood glucose numbers and arrow. Educated on how to recognize and treat low blood glucose.  Discussed low carbohydrate diet. Provided handout on plate method and serving size recommendations Reviewed home blood glucose goals  Fasting blood glucose goal (before meals) = 80 to 130 Blood glucose goal after a meal = less than 180  Check blood pressure 2 to 3 times per week. Continue current medications for hypertension / heart Discussed LDL and Tg goals. Recommended continue to take rosuvastatin daily in the morning. Recheck lipids in 1 to 2 months.    Follow Up:  Patient agrees to Care Plan and Follow-up.  Plan: Face to Face appointment with care management team member scheduled for: 2 weeks   Cherre Robins, PharmD Clinical Pharmacist [facility]

## 2021-11-15 NOTE — Telephone Encounter (Signed)
Patient states that she had another low blood glucose alert when she got home today. States that blood glucose was 50. She ate something and came up to 77 and then 1 hours later 102. She will eat dinner in the next hour.  Recommended she only take glimepiride 0.5 tablet tomorrow morning = '2mg'$  instead of usual '4mg'$  dose.  Continue to use Continuous Glucose Monitor and contact office if she continues to have lows.

## 2021-11-16 ENCOUNTER — Telehealth: Payer: Self-pay | Admitting: Pharmacist

## 2021-11-16 NOTE — Telephone Encounter (Signed)
Checked on patient - regarding Continuous Glucose Monitor that was started yesterday.  Has watermelon for lunch around 1.5 hours ago. Blood glucose is currently 150. Patient states she has not had any low alerts. Took lower dose of glimepiride this morning '2mg'$  today instead of '4mg'$ .  Advised to continue glimepiride at '2mg'$  daily.

## 2021-11-24 ENCOUNTER — Other Ambulatory Visit: Payer: Self-pay | Admitting: Physician Assistant

## 2021-12-01 ENCOUNTER — Ambulatory Visit: Payer: No Typology Code available for payment source | Admitting: Pharmacist

## 2021-12-01 DIAGNOSIS — E1151 Type 2 diabetes mellitus with diabetic peripheral angiopathy without gangrene: Secondary | ICD-10-CM

## 2021-12-01 DIAGNOSIS — I10 Essential (primary) hypertension: Secondary | ICD-10-CM

## 2021-12-01 DIAGNOSIS — E785 Hyperlipidemia, unspecified: Secondary | ICD-10-CM

## 2021-12-01 NOTE — Patient Instructions (Signed)
Beth Shepard It was a pleasure seeing you again today  Continue to use the DexCom to check you blood glucose continuously.  Stop take glimepiride to see if helps to decrease the low blood glucose readings you have been having.  Continue to take Ozempic 0.'5mg'$  weekly  Please contact our office if you continue to have blood glucose readings less than 70 or over 180.  If you have any questions or concerns, please feel free to contact me either at the phone number below or with a MyChart message.   Keep up the good work!  Cherre Robins, PharmD Clinical Pharmacist [facility] [phone] (direct line)  [phone] (main office number)

## 2021-12-01 NOTE — Chronic Care Management (AMB) (Signed)
Care Management   Pharmacy Note  12/01/2021 Name: Beth Shepard MRN: [id number] DOB: Jan 09, 1956  Subjective: Beth Shepard is a 66 y.o. year old female who is a primary care patient of Ann Held, DO. The Care Management team was consulted for assistance with care management and care coordination needs.    Engaged with patient by telephone for initial visit in response to provider referral for pharmacy case management and/or care coordination services.   The patient was given information about Care Management services today including:  Care Management services includes personalized support from designated clinical staff supervised by the patient's primary care provider, including individualized plan of care and coordination with other care providers. 24/7 contact phone numbers for assistance for urgent and routine care needs. The patient may stop case management services at any time by phone call to the office staff.  Patient agreed to services and consent obtained.  Patient was referred for medication management and assistance with type 2 DM, hypertension and other chronic conditions. Type 2 DM: Last A1c was 12.4%. Patient is taking Ozempic 0.56m weekly. Started Dex Com 7 Continuous Glucose Monitor at last visit 2 weeks ago and noted that blood glucose was low - decreased glimepiride from 420mdaily to 37m34maily. She has changed diet and has lost a significant amount of weight.  Today reviewed DexCom information:  Time in Range over the last 14 days was 108;  90% of time blood glucose is in range (70 to 180);  less than 1% of time blood glucose is > 180 or 250;  8% of time blood glucose is low (between 70 and 55) and  1% of time blood glucose is very low (<55)  Average blood glucose for last 14 days = 108; last 7 days 109  Hypertension / cardiomyopathy / chronic venous insufficiency: blood pressure was at goal. She is taking lisinopril, carvedilol, furosemide and  spironolactone. She is not checking blood pressure at home but does have a blood pressure monitor.   Hyperlipidemia: LDL goal is < 70 and TG < 150. Patient is not at goal per last lipid panel. She states she was forgetting to take rosuvastatin because was trying to take at night. She has switched to taking rosuvastatin in the morning and reports improved adherence.    Memory changes: patient has noticed some difficulty remembering lately. Her daughters are concerned. Patient has referral for evaluation by neuropsychiatry. She expresses that her daughters are trying to help her and she is appreciative but she is afraid that she will be left out of decisions and this concerning her.    Assessment:  Review of patient status, including review of consultants reports, laboratory and other test data, was performed as part of comprehensive evaluation and provision of chronic care management services.   SDOH (Social Determinants of Health) assessments and interventions performed:      Objective:  Wt Readings from Last 3 Encounters:  11/15/21 168 lb (76.2 kg)  09/18/21 182 lb 3.2 oz (82.6 kg)  09/08/21 178 lb 3.2 oz (80.8 kg)     Lab Results  Component Value Date   CREATININE 1.17 09/11/2021   CREATININE 1.14 07/06/2021   CREATININE 1.50 (H) 01/29/2020    Lab Results  Component Value Date   HGBA1C 12.4 (H) 09/11/2021       Component Value Date/Time   CHOL 228 (H) 09/11/2021 1321   TRIG 271.0 (H) 09/11/2021 1321   HDL 46.00 09/11/2021 1321   CHOLHDL 5 09/11/2021 1321  VLDL 54.2 (H) 09/11/2021 1321   LDLCALC 115 (H) 04/06/2016 1243   LDLDIRECT 148.0 09/11/2021 1321    Clinical ASCVD: No  The 10-year ASCVD risk score (Arnett DK, et al., 2019) is: 20.7%   Values used to calculate the score:     Age: 15 years     Sex: Female     Is Non-Hispanic African American: No     Diabetic: Yes     Tobacco smoker: No     Systolic Blood Pressure: 518 mmHg     Is BP treated: Yes     HDL  Cholesterol: 46 mg/dL     Total Cholesterol: 228 mg/dL     BP Readings from Last 3 Encounters:  09/18/21 128/78  09/08/21 118/80  07/06/21 134/78    Care Plan  Allergies  Allergen Reactions   Prednisone Palpitations and Other (See Comments)    Dose pack causes vomiting; Causes Gerd N/v/d N/v/d    Codeine Nausea Only and Palpitations    REACTION: palpitation  REACTION: palpitation     Ibuprofen Other (See Comments) and Rash    REACTION: questionable. Pt states that she can take this sometimes. GERD    Medications Reviewed Today     Reviewed by Cherre Robins, RPH-CPP (Pharmacist) on 11/03/21 at 1449  Med List Status: <None>   Medication Order Taking? Sig Documenting Provider Last Dose Status Informant  albuterol (PROAIR HFA) 108 (90 Base) MCG/ACT inhaler [id number] Yes INHALE 2 PUFFS INTO THE LUNGS EVERY 6 HOURS AS NEEDED FOR WHEEZE Copland, Gay Filler, MD Taking Active   ALPRAZolam (XANAX) 0.25 MG tablet [id number] Yes Take 1 tablet (0.25 mg total) by mouth 3 (three) times daily as needed. Ann Held, DO Taking Active            Med Note Antony Contras, Khamia Stambaugh B   Fri Nov 03, 2021  2:37 PM) Usually takes 0.5 tablet  aspirin 81 MG tablet [id number] Yes Take 81 mg by mouth daily. [provider] Taking Active Self  blood glucose meter kit and supplies KIT [id number] No Dispense based on patient and insurance preference. Use up to four times daily as directed.  Patient not taking: Reported on 11/03/2021   Ann Held, DO Not Taking Active   Calcium Carb-Cholecalciferol (CALCIUM-VITAMIN D) 500-200 MG-UNIT tablet [id number] Yes Take 1 tablet by mouth daily. [provider] Taking Active   carvedilol (COREG) 12.5 MG tablet [id number] Yes Take 1 tablet (12.5 mg total) by mouth 2 (two) times daily. Roma Schanz R, DO Taking Active   fluticasone (FLONASE) 50 MCG/ACT nasal spray [id number]  Place 2 sprays into both nostrils daily. Roma Schanz R, DO   Active   furosemide (LASIX) 20 MG tablet [id number] Yes Take 1 tablet (20 mg total) by mouth daily. Carollee Herter, Kendrick Fries R, DO Taking Active   gabapentin (NEURONTIN) 100 MG capsule [id number]  TAKE 1 CAPSULE BY MOUTH THREE TIMES A DAY Ann Held, DO  Active   glimepiride (AMARYL) 4 MG tablet [id number] Yes TAKE 1 TABLET BY MOUTH EVERY DAY BEFORE BREAKFAST Roma Schanz R, DO Taking Active   glucose blood test strip [id number] No 1 each by Other route as needed. Reported on 11/02/2015  Patient not taking: Reported on 11/03/2021   [provider] Not Taking Active Self  lisinopril (ZESTRIL) 20 MG tablet [id number] No TAKE 1 TABLET BY MOUTH EVERY DAY  Patient not taking: Reported on 11/03/2021   Roma Schanz  R, DO Not Taking Active   magic mouthwash SOLN [id number] Yes Swish and spit 4 times a day Carollee Herter, Alferd Apa, DO Taking Active   OneTouch Delica Lancets 56Y MISC [id number] No Use to check sugar 4 times a day.  Dx code E11.9  Patient not taking: Reported on 11/03/2021   Ann Held, DO Not Taking Active   pantoprazole (PROTONIX) 40 MG tablet [id number] Yes Take 1 tablet (40 mg total) by mouth in the morning. ** CALL OFFICE TO SCHEDULE APPOINTMENT Esterwood, Amy S, PA-C Taking Active   rosuvastatin (CRESTOR) 10 MG tablet [id number] Yes Take 1 tablet (10 mg total) by mouth at bedtime.  Patient taking differently: Take 10 mg by mouth daily.   Roma Schanz R, DO Taking Active   Semaglutide,0.25 or 0.5MG/DOS, (OZEMPIC, 0.25 OR 0.5 MG/DOSE,) 2 MG/1.5ML SOPN [id number] Yes Inject 0.5 mg into the skin once a week. Roma Schanz R, DO Taking Active   spironolactone (ALDACTONE) 25 MG tablet [id number] Yes Take 1 tablet (25 mg total) by mouth every other day. Ann Held, DO Taking Active             Patient Active Problem List   Diagnosis Date Noted   Hyperlipidemia associated with type 2 diabetes mellitus ([employer]) 09/18/2021   Memory loss  09/08/2021   Thrush 09/08/2021   IBS (irritable bowel syndrome) 05/11/2020   Uncontrolled type 2 diabetes mellitus with hyperglycemia ([place]) 02/01/2020   Nausea without vomiting 01/27/2019   Preventative health care 12/18/2017   Infected sebaceous cyst of skin 07/19/2017   DCM (dilated cardiomyopathy) ([place]) 07/10/2017   Fibromyalgia 04/05/2017   Chronic pain syndrome 04/05/2017   Pain in both lower extremities 02/06/2017   Chronic venous insufficiency 02/06/2017   Cellulitis of lower extremity 08/28/2016   Pain in right ankle and joints of right foot 08/09/2016   Peripheral edema 07/10/2016   Trochanteric bursitis, right hip 05/03/2016   Chronic kidney disease (CKD) stage G3a/A1, moderately decreased glomerular filtration rate (GFR) between 45-59 mL/min/1.73 square meter and albuminuria creatinine ratio less than 30 mg/g ([place]) 04/07/2016   Primary osteoarthritis of right hip 10/24/2015   Hip joint replacement status 10/24/2015   Chest pain 08/25/2015   Right hip pain 08/25/2015   Chest pain, precordial 08/10/2015   Precordial pain 08/10/2015   Cardiomyopathy ([place]) 01/18/2015   Block, bundle branch, left 01/18/2015   Left bundle-branch block 01/18/2015   Diabetes ([place]) 01/18/2015   Nausea alone 12/17/2013   Diarrhea 11/19/2013   Bloating 11/19/2013   Obesity (BMI 30-39.9) 10/21/2013   Multiple joint pain 11/13/2012   MYALGIA 08/29/2010   ABDOMINAL PAIN OTHER SPECIFIED SITE 08/22/2009   OSTEOARTHROS UNSPEC WHETHER GEN/LOC UNSPEC SITE 03/17/2009   Cellulitis and abscess of face 12/17/2008   Disturbance of skin sensation 12/17/2008   Bronchitis 05/07/2008   Anxiety 12/04/2007   SINUSITIS, ACUTE NOS 12/19/2006   Diabetes mellitus, type II ([place]) 10/08/2006   Hyperlipidemia LDL goal <70 10/08/2006   Essential hypertension 10/08/2006   COPD (chronic obstructive pulmonary disease) ([place]) 10/08/2006   GERD 10/08/2006   Other postprocedural status(V45.89) 10/08/2006   Medication  Assistance:  None required.  Patient affirms current coverage meets needs.  Assessment:  Type 2 DM: not at A1c goal of < 7.0% but Continuous Glucose Monitor report shows that since Ozempic has been added she has been having more lows. Still having lows despite lowering glimepiride dose at last visit from 60m to 242mdaily. Hypertension: at  goal of < 140/90 Cardiomyopathy: on GDT Memory changes Neuropathy   Plan: Continue Ozempic 0.41m weekly Stop glimepiride 287mdaily.  Patient to contact office if blood glucose < 70 or > 180 for medication adjustment.   Reviewed How to Treat a Low Glucose Level:  If you have a low blood glucose less than 70, please eat / drink 15 grams of carbohydrates (4 oz of juice, soda, 4 glucose tablets, or 3-4 pieces of hard candy).  It is best so choose a "quick" source of sugar that does no contain fat (chocolate and peanut butter might take longer to increase your blood glucose) Wait 15 minutes and then recheck your blood glucose. If your blood glucose is still less than 70, eat another 15 grams of carbohydrates.  Wait another 15 minutes and recheck your glucose.  Continue this until your blood glucose is over 70. Once you blood glucose is over 70, eat a snack with protein in it to prevent your blood glucose from dropping again.  Discussed low carbohydrate diet. Reviewed home blood glucose goals  Fasting blood glucose goal (before meals) = 80 to 130 Blood glucose goal after a meal = less than 180  Check blood pressure 2 to 3 times per week. Continue current medications for hypertension / heart Discussed LDL and Tg goals. Recommended continue to take rosuvastatin daily in the morning. Recheck lipids in 1 to 2 months.    Follow Up:  Patient agrees to Care Plan and Follow-up.  Plan: Face to Face appointment with care management team member scheduled for: July 2023; will see PCP December 22, 2021.   TaCherre RobinsPharmD Clinical Pharmacist [facility]

## 2021-12-03 ENCOUNTER — Other Ambulatory Visit: Payer: Self-pay | Admitting: Family Medicine

## 2021-12-03 DIAGNOSIS — E1165 Type 2 diabetes mellitus with hyperglycemia: Secondary | ICD-10-CM

## 2021-12-18 ENCOUNTER — Ambulatory Visit: Payer: No Typology Code available for payment source | Admitting: Family Medicine

## 2021-12-22 ENCOUNTER — Encounter: Payer: Self-pay | Admitting: Family Medicine

## 2021-12-22 ENCOUNTER — Ambulatory Visit: Payer: No Typology Code available for payment source | Admitting: Pharmacist

## 2021-12-22 ENCOUNTER — Ambulatory Visit (INDEPENDENT_AMBULATORY_CARE_PROVIDER_SITE_OTHER): Payer: No Typology Code available for payment source | Admitting: Family Medicine

## 2021-12-22 VITALS — BP 110/80 | HR 91 | Temp 98.7°F | Resp 18 | Ht 61.0 in | Wt 163.0 lb

## 2021-12-22 DIAGNOSIS — M069 Rheumatoid arthritis, unspecified: Secondary | ICD-10-CM

## 2021-12-22 DIAGNOSIS — E1169 Type 2 diabetes mellitus with other specified complication: Secondary | ICD-10-CM

## 2021-12-22 DIAGNOSIS — J449 Chronic obstructive pulmonary disease, unspecified: Secondary | ICD-10-CM

## 2021-12-22 DIAGNOSIS — I10 Essential (primary) hypertension: Secondary | ICD-10-CM

## 2021-12-22 DIAGNOSIS — E11649 Type 2 diabetes mellitus with hypoglycemia without coma: Secondary | ICD-10-CM

## 2021-12-22 DIAGNOSIS — E785 Hyperlipidemia, unspecified: Secondary | ICD-10-CM

## 2021-12-22 DIAGNOSIS — E1151 Type 2 diabetes mellitus with diabetic peripheral angiopathy without gangrene: Secondary | ICD-10-CM

## 2021-12-22 NOTE — Assessment & Plan Note (Addendum)
hgba1c acceptable, minimize simple carbs. Increase exercise as tolerated. Continue current meds Pt also worked with Tammy on the dex com G7

## 2021-12-22 NOTE — Progress Notes (Signed)
Established Patient Office Visit  Subjective   Patient ID: Beth Shepard, female    DOB: 29-Mar-1956  Age: 66 y.o. MRN: [id number]  Chief Complaint  Patient presents with   Diabetes    Pt states sugars have been low as 50 but states not eating much. Pt states only having breakfast today   Follow-up    HPI Pt is her to f/u dm.  Her bs have been low at home and she is having trouble with her dex com.  She is also not eating much.  She has only been eating breakfast.    Patient Active Problem List   Diagnosis Date Noted   Rheumatoid arthritis involving multiple joints ([place]) 12/22/2021   Type 2 diabetes mellitus with diabetic peripheral angiopathy without gangrene, without long-term current use of insulin ([place]) 12/22/2021   Hyperlipidemia associated with type 2 diabetes mellitus ([place]) 09/18/2021   Memory loss 09/08/2021   Thrush 09/08/2021   IBS (irritable bowel syndrome) 05/11/2020   Uncontrolled type 2 diabetes mellitus with hyperglycemia ([place]) 02/01/2020   Nausea without vomiting 01/27/2019   Preventative health care 12/18/2017   Infected sebaceous cyst of skin 07/19/2017   DCM (dilated cardiomyopathy) ([place]) 07/10/2017   Fibromyalgia 04/05/2017   Chronic pain syndrome 04/05/2017   Pain in both lower extremities 02/06/2017   Chronic venous insufficiency 02/06/2017   Cellulitis of lower extremity 08/28/2016   Pain in right ankle and joints of right foot 08/09/2016   Peripheral edema 07/10/2016   Trochanteric bursitis, right hip 05/03/2016   Chronic kidney disease (CKD) stage G3a/A1, moderately decreased glomerular filtration rate (GFR) between 45-59 mL/min/1.73 square meter and albuminuria creatinine ratio less than 30 mg/g ([place]) 04/07/2016   Primary osteoarthritis of right hip 10/24/2015   Hip joint replacement status 10/24/2015   Chest pain 08/25/2015   Right hip pain 08/25/2015   Chest pain, precordial 08/10/2015   Precordial pain 08/10/2015   Cardiomyopathy ([place])  01/18/2015   Block, bundle branch, left 01/18/2015   Left bundle-branch block 01/18/2015   Diabetes ([place]) 01/18/2015   Nausea alone 12/17/2013   Diarrhea 11/19/2013   Bloating 11/19/2013   Obesity (BMI 30-39.9) 10/21/2013   Multiple joint pain 11/13/2012   MYALGIA 08/29/2010   ABDOMINAL PAIN OTHER SPECIFIED SITE 08/22/2009   OSTEOARTHROS UNSPEC WHETHER GEN/LOC UNSPEC SITE 03/17/2009   Cellulitis and abscess of face 12/17/2008   Disturbance of skin sensation 12/17/2008   Bronchitis 05/07/2008   Anxiety 12/04/2007   SINUSITIS, ACUTE NOS 12/19/2006   Diabetes mellitus, type II ([place]) 10/08/2006   Hyperlipidemia LDL goal <70 10/08/2006   Essential hypertension 10/08/2006   COPD (chronic obstructive pulmonary disease) ([place]) 10/08/2006   GERD 10/08/2006   Other postprocedural status(V45.89) 10/08/2006   Past Medical History:  Diagnosis Date   Anxiety    Bilateral edema of lower extremity    Swelling in legs and ankles   Congestive heart failure (HCC)    COPD (chronic obstructive pulmonary disease) (HCC)    Diabetes mellitus    type 2   Gastric polyps    GERD (gastroesophageal reflux disease)    chronic   Hyperlipidemia    Hypertension    IBS (irritable bowel syndrome)    LBBB (left bundle branch block)    Dr. Gwenith Spitz, [facility]   Pneumonia    hx of   PONV (postoperative nausea and vomiting)    Had N/V after having tubal ligation and D&C   Rheumatoid arthritis ([place])    Shingles  Past Surgical History:  Procedure Laterality Date   CARDIAC CATHETERIZATION     No PCI   CHOLECYSTECTOMY     COLONOSCOPY  06/06/2005   normal    DILATION AND CURETTAGE OF UTERUS     ESOPHAGOGASTRODUODENOSCOPY  06/06/2005, 11/17/2013   JOINT REPLACEMENT Right 10/24/2015   rt hip replacement   KNEE ARTHROSCOPY Left    TOTAL HIP ARTHROPLASTY Right 10/24/2015   Procedure: RIGHT TOTAL HIP ARTHROPLASTY ANTERIOR APPROACH;  Surgeon: Leandrew Koyanagi, MD;  Location: [facility];  Service:  Orthopedics;  Laterality: Right;   TUBAL LIGATION     VAGINAL HYSTERECTOMY     Social History   Tobacco Use   Smoking status: Former    Years: 33.00    Types: Cigarettes    Quit date: 06/25/2002    Years since quitting: 19.5   Smokeless tobacco: Never  Vaping Use   Vaping Use: Never used  Substance Use Topics   Alcohol use: Yes    Comment: rare   Drug use: No   Social History   Socioeconomic History   Marital status: Widowed    Spouse name: Not on file   Number of children: 2   Years of education: Not on file   Highest education level: Not on file  Occupational History   Occupation: Administive     Employer: [employer]  Tobacco Use   Smoking status: Former    Years: 33.00    Types: Cigarettes    Quit date: 06/25/2002    Years since quitting: 19.5   Smokeless tobacco: Never  Vaping Use   Vaping Use: Never used  Substance and Sexual Activity   Alcohol use: Yes    Comment: rare   Drug use: No   Sexual activity: Not Currently    Partners: Male  Other Topics Concern   Not on file  Social History Narrative   Limited exercise per cardiology   Social Determinants of Health   Financial Resource Strain: Low Risk  (11/07/2021)   Overall Financial Resource Strain (CARDIA)    Difficulty of Paying Living Expenses: Not very hard  Food Insecurity: Not on file  Transportation Needs: Not on file  Physical Activity: Not on file  Stress: Not on file  Social Connections: Not on file  Intimate Partner Violence: Not on file   Family Status  Relation Name Status   Mother  Deceased at age 56   Sister  35   MGM  Deceased at age 64       MI   MGF  Deceased   PGM  Deceased   PGF  Deceased   Father  Deceased at age 34       septic shock   Daughter  Alive   Daughter  Alive   Neg Hx  (Not Specified)   Family History  Problem Relation Age of Onset   Lymphoma Mother    Heart disease Mother        chf, cabg x6   Hypertension Mother    GER disease Mother     Hiatal hernia Mother    Gallstones Mother    Brain cancer Sister        brain stage 4   Heart disease Maternal Grandmother 2       MI   Heart disease Maternal Grandfather    Stroke Paternal Grandmother    Hypertension Paternal Grandmother    Stroke Paternal Grandfather    Hypertension Paternal Grandfather    Rheum arthritis  Father    Hypertension Daughter    Colon cancer Neg Hx    Esophageal cancer Neg Hx    Pancreatic cancer Neg Hx    Liver disease Neg Hx    Kidney disease Neg Hx    Allergies  Allergen Reactions   Prednisone Palpitations and Other (See Comments)    Dose pack causes vomiting; Causes Gerd N/v/d N/v/d    Codeine Nausea Only and Palpitations    REACTION: palpitation  REACTION: palpitation     Ibuprofen Other (See Comments) and Rash    REACTION: questionable. Pt states that she can take this sometimes. GERD      Review of Systems  Constitutional:  Negative for fever and malaise/fatigue.  HENT:  Negative for congestion.   Eyes:  Negative for blurred vision.  Respiratory:  Negative for shortness of breath.   Cardiovascular:  Negative for chest pain, palpitations and leg swelling.  Gastrointestinal:  Negative for abdominal pain, blood in stool and nausea.  Genitourinary:  Negative for dysuria and frequency.  Musculoskeletal:  Negative for falls.  Skin:  Negative for rash.  Neurological:  Negative for dizziness, loss of consciousness and headaches.  Endo/Heme/Allergies:  Negative for environmental allergies.  Psychiatric/Behavioral:  Negative for depression. The patient is not nervous/anxious.       Objective:     BP 110/80 (BP Location: Left Arm, Patient Position: Sitting, Cuff Size: Large)   Pulse 91   Temp 98.7 F (37.1 C) (Oral)   Resp 18   Ht '5\' 1"'$  (1.549 m)   Wt 163 lb (73.9 kg)   SpO2 96%   BMI 30.80 kg/m  BP Readings from Last 3 Encounters:  12/22/21 110/80  09/18/21 128/78  09/08/21 118/80   Wt Readings from Last 3  Encounters:  12/22/21 163 lb (73.9 kg)  11/15/21 168 lb (76.2 kg)  09/18/21 182 lb 3.2 oz (82.6 kg)   SpO2 Readings from Last 3 Encounters:  12/22/21 96%  09/18/21 95%  09/08/21 95%      Physical Exam Vitals and nursing note reviewed.  Constitutional:      Appearance: She is well-developed.  HENT:     Head: Normocephalic and atraumatic.  Eyes:     Conjunctiva/sclera: Conjunctivae normal.  Neck:     Thyroid: No thyromegaly.     Vascular: No carotid bruit or JVD.  Cardiovascular:     Rate and Rhythm: Normal rate and regular rhythm.     Heart sounds: Normal heart sounds. No murmur heard. Pulmonary:     Effort: Pulmonary effort is normal. No respiratory distress.     Breath sounds: Normal breath sounds. No wheezing or rales.  Chest:     Chest wall: No tenderness.  Musculoskeletal:        General: Normal range of motion.     Cervical back: Normal range of motion and neck supple.  Neurological:     Mental Status: She is alert and oriented to person, place, and time.      No results found for any visits on 12/22/21.  Last CBC Lab Results  Component Value Date   WBC 7.6 07/06/2021   HGB 11.9 (L) 07/06/2021   HCT 36.2 07/06/2021   MCV 85.7 07/06/2021   MCH 28.0 01/13/2016   RDW 13.1 07/06/2021   PLT 316.0 16/03/9603   Last metabolic panel Lab Results  Component Value Date   GLUCOSE 350 (H) 09/11/2021   NA 136 09/11/2021   K 3.6 09/11/2021   CL 97 09/11/2021  CO2 30 09/11/2021   BUN 10 09/11/2021   CREATININE 1.17 09/11/2021   GFRNONAA 46 (L) 10/25/2015   CALCIUM 9.7 09/11/2021   PROT 7.2 09/11/2021   ALBUMIN 4.3 09/11/2021   BILITOT 0.6 09/11/2021   ALKPHOS 100 09/11/2021   AST 15 09/11/2021   ALT 13 09/11/2021   ANIONGAP 8 10/25/2015   Last lipids Lab Results  Component Value Date   CHOL 228 (H) 09/11/2021   HDL 46.00 09/11/2021   LDLCALC 115 (H) 04/06/2016   LDLDIRECT 148.0 09/11/2021   TRIG 271.0 (H) 09/11/2021   CHOLHDL 5 09/11/2021    Last hemoglobin A1c Lab Results  Component Value Date   HGBA1C 12.4 (H) 09/11/2021   Last thyroid functions Lab Results  Component Value Date   TSH 2.27 07/06/2021   Last vitamin D Lab Results  Component Value Date   VD25OH 16 (L) 08/29/2010   Last vitamin B12 and Folate Lab Results  Component Value Date   VITAMINB12 281 01/29/2020      The 10-year ASCVD risk score (Arnett DK, et al., 2019) is: 15.7%    Assessment & Plan:   Problem List Items Addressed This Visit       Unprioritized   COPD (chronic obstructive pulmonary disease) ([place])   Diabetes ([place]) - Primary   Relevant Orders   Ambulatory referral to Endocrinology   CBC with Differential/Platelet   Comprehensive metabolic panel   Hemoglobin A1c   Lipid panel   Microalbumin / creatinine urine ratio   Hyperlipidemia associated with type 2 diabetes mellitus ([place])   Relevant Orders   CBC with Differential/Platelet   Comprehensive metabolic panel   Hemoglobin A1c   Lipid panel   Microalbumin / creatinine urine ratio   Type 2 diabetes mellitus with diabetic peripheral angiopathy without gangrene, without long-term current use of insulin (HCC)   Rheumatoid arthritis involving multiple joints (HCC)   Hyperlipidemia LDL goal <70    Encourage heart healthy diet such as MIND or DASH diet, increase exercise, avoid trans fats, simple carbohydrates and processed foods, consider a krill or fish or flaxseed oil cap daily.       Essential hypertension    Well controlled, no changes to meds. Encouraged heart healthy diet such as the DASH diet and exercise as tolerated.       Other Visit Diagnoses     Primary hypertension       Relevant Orders   CBC with Differential/Platelet   Comprehensive metabolic panel   Hemoglobin A1c   Lipid panel   Microalbumin / creatinine urine ratio       Return in about 6 months (around 06/23/2022), or if symptoms worsen or fail to improve, for annual exam, fasting.    Ann Held, DO

## 2021-12-22 NOTE — Patient Instructions (Signed)

## 2021-12-22 NOTE — Chronic Care Management (AMB) (Signed)
Care Management   Pharmacy Note  12/22/2021 Name: Beth Shepard MRN: [id number] DOB: 04-06-1956  Subjective: Beth Shepard is a 66 y.o. year old female who is a primary care patient of Ann Held, DO. The Care Management team was consulted for assistance with care management and care coordination needs.     Engaged with patient face to face for follow up visit in response to provider referral for pharmacy case management and/or care coordination services.   The patient was given information about Care Management services today including:  Care Management services includes personalized support from designated clinical staff supervised by the patient's primary care provider, including individualized plan of care and coordination with other care providers. 24/7 contact phone numbers for assistance for urgent and routine care needs. The patient may stop case management services at any time by phone call to the office staff.  Patient agreed to services and consent obtained.  Patient was referred for medication management and assistance with type 2 DM, hypertension and other chronic conditions. Type 2 DM: Last A1c was 12.4%. Patient is taking Ozempic 0.20m weekly. Started Dex Com 7 Continuous Glucose Monitor in May 2023. Patient continues to have low alerts despite stopping glimepiride at last visit. Suspect she might not be eating enough. She does reports that she has little appetite since starting Ozempic and taste has changed. She also is having dexterity related issues with Ozempic injection and Dex Com 7. Her daughter is helping but she feels that she is a burden to her.    Today reviewed DexCom information:  Time in Range over the last 14 days was 109;  90% of time blood glucose is in range (70 to 180);  less than 1% of time blood glucose is > 180 or 250;  7% of time blood glucose is low (between 70 and 55) and  2% of time blood glucose is very low (<55)   Hypertension /  cardiomyopathy / chronic venous insufficiency: blood pressure was at goal. She is taking carvedilol, furosemide and spironolactone. For some reason she stopped lisinopril when she stopped glimepiride.   Hyperlipidemia: LDL goal is < 70 and TG < 150. Patient is not at goal per last lipid panel. She states she was forgetting to take rosuvastatin because was trying to take at night. She has switched to taking rosuvastatin in the morning and reports improved adherence.    Assessment:  Review of patient status, including review of consultants reports, laboratory and other test data, was performed as part of comprehensive evaluation and provision of chronic care management services.   SDOH (Social Determinants of Health) assessments and interventions performed:      Objective:  Wt Readings from Last 3 Encounters:  12/22/21 163 lb (73.9 kg)  11/15/21 168 lb (76.2 kg)  09/18/21 182 lb 3.2 oz (82.6 kg)     Lab Results  Component Value Date   CREATININE 1.17 09/11/2021   CREATININE 1.14 07/06/2021   CREATININE 1.50 (H) 01/29/2020    Lab Results  Component Value Date   HGBA1C 12.4 (H) 09/11/2021       Component Value Date/Time   CHOL 228 (H) 09/11/2021 1321   TRIG 271.0 (H) 09/11/2021 1321   HDL 46.00 09/11/2021 1321   CHOLHDL 5 09/11/2021 1321   VLDL 54.2 (H) 09/11/2021 1321   LDLCALC 115 (H) 04/06/2016 1243   LDLDIRECT 148.0 09/11/2021 1321    Clinical ASCVD: No  The 10-year ASCVD risk score (Arnett DK, et al., 2019) is:  15.7%   Values used to calculate the score:     Age: 37 years     Sex: Female     Is Non-Hispanic African American: No     Diabetic: Yes     Tobacco smoker: No     Systolic Blood Pressure: 732 mmHg     Is BP treated: Yes     HDL Cholesterol: 46 mg/dL     Total Cholesterol: 228 mg/dL     BP Readings from Last 3 Encounters:  12/22/21 110/80  09/18/21 128/78  09/08/21 118/80    Care Plan  Allergies  Allergen Reactions   Prednisone Palpitations  and Other (See Comments)    Dose pack causes vomiting; Causes Gerd N/v/d N/v/d    Codeine Nausea Only and Palpitations    REACTION: palpitation  REACTION: palpitation     Ibuprofen Other (See Comments) and Rash    REACTION: questionable. Pt states that she can take this sometimes. GERD    Medications Reviewed Today     Reviewed by Sanda Linger, CMA (Certified Medical Assistant) on 12/22/21 at [facility] List Status: <None>   Medication Order Taking? Sig Documenting Provider Last Dose Status Informant  albuterol (PROAIR HFA) 108 (90 Base) MCG/ACT inhaler [id number]  INHALE 2 PUFFS INTO THE LUNGS EVERY 6 HOURS AS NEEDED FOR WHEEZE Copland, Gay Filler, MD  Active   ALPRAZolam (XANAX) 0.25 MG tablet [id number]  Take 1 tablet (0.25 mg total) by mouth 3 (three) times daily as needed. Ann Held, DO  Active            Med Note Antony Contras, [place] B   Fri Nov 03, 2021  2:37 PM) Usually takes 0.5 tablet  aspirin 81 MG tablet [id number]  Take 81 mg by mouth daily. [provider]  Active Self  blood glucose meter kit and supplies KIT [id number] No Dispense based on patient and insurance preference. Use up to four times daily as directed.  Patient not taking: Reported on 12/01/2021   Ann Held, DO Not Taking Active   Calcium Carb-Cholecalciferol (CALCIUM-VITAMIN D) 500-200 MG-UNIT tablet [id number]  Take 1 tablet by mouth daily. [provider]  Active   carvedilol (COREG) 12.5 MG tablet [id number]  Take 1 tablet (12.5 mg total) by mouth 2 (two) times daily. Ann Held, DO  Active   Continuous Blood Gluc Receiver (DEXCOM G7 RECEIVER) DEVI [id number]  Use to check blood glucose with DexCom 7 sensors Carollee Herter, Alferd Apa, DO  Active   Continuous Blood Gluc Sensor (DEXCOM G7 SENSOR) MISC [id number]  Use to check blood glucose continuously Carollee Herter, Alferd Apa, DO  Active   fluticasone (FLONASE) 50 MCG/ACT nasal spray [id number]  Place 2 sprays into both  nostrils daily. Roma Schanz R, DO  Active   furosemide (LASIX) 20 MG tablet [id number]  Take 1 tablet (20 mg total) by mouth daily. Carollee Herter, Kendrick Fries R, DO  Active   gabapentin (NEURONTIN) 100 MG capsule [id number]  TAKE 1 CAPSULE BY MOUTH THREE TIMES A DAY Ann Held, DO  Active   glucose blood test strip [id number] No One Touch Verio Test Strips; Use to check blood glucose twice a day (Dx: uncontrolled type 2 DM)  Patient not taking: Reported on 12/01/2021   Ann Held, DO Not Taking Active   lisinopril (ZESTRIL) 20 MG tablet [id number]  Take 1 tablet (20 mg total) by mouth daily. For blood pressure and kidney  protection Ann Held, DO  Active   magic mouthwash SOLN [id number]  Swish and spit 4 times a day Carollee Herter, Alferd Apa, DO  Active   OneTouch Delica Lancets 40X MISC [id number] No Use to check sugar 4 times a day.  Dx code E11.9  Patient not taking: Reported on 12/01/2021   Ann Held, DO Not Taking Active   OZEMPIC, 0.25 OR 0.5 MG/DOSE, 2 MG/3ML SOPN [id number]  INJECT 0.5 MG INTO THE SKIN ONCE A WEEK. Carollee Herter, Alferd Apa, DO  Active   pantoprazole (PROTONIX) 40 MG tablet [id number]  Take 1 tablet (40 mg total) by mouth in the morning. ** CALL OFFICE TO SCHEDULE APPOINTMENT  Patient not taking: Reported on 12/01/2021   Alfredia Ferguson, PA-C  Active            Med Note Antony Contras, [place] B   Fri Dec 01, 2021  2:56 PM) Unable to get refills - due to see gastroenterologist  rosuvastatin (CRESTOR) 10 MG tablet [id number]  Take 1 tablet (10 mg total) by mouth daily. (Ok to take morning or evening) For cholesterol Carollee Herter, Alferd Apa, DO  Active   spironolactone (ALDACTONE) 25 MG tablet [id number]  Take 1 tablet (25 mg total) by mouth every other day. Ann Held, DO  Active             Patient Active Problem List   Diagnosis Date Noted   Hyperlipidemia associated with type 2 diabetes mellitus ([place]) 09/18/2021   Memory loss 09/08/2021    Thrush 09/08/2021   IBS (irritable bowel syndrome) 05/11/2020   Uncontrolled type 2 diabetes mellitus with hyperglycemia ([place]) 02/01/2020   Nausea without vomiting 01/27/2019   Preventative health care 12/18/2017   Infected sebaceous cyst of skin 07/19/2017   DCM (dilated cardiomyopathy) ([place]) 07/10/2017   Fibromyalgia 04/05/2017   Chronic pain syndrome 04/05/2017   Pain in both lower extremities 02/06/2017   Chronic venous insufficiency 02/06/2017   Cellulitis of lower extremity 08/28/2016   Pain in right ankle and joints of right foot 08/09/2016   Peripheral edema 07/10/2016   Trochanteric bursitis, right hip 05/03/2016   Chronic kidney disease (CKD) stage G3a/A1, moderately decreased glomerular filtration rate (GFR) between 45-59 mL/min/1.73 square meter and albuminuria creatinine ratio less than 30 mg/g ([place]) 04/07/2016   Primary osteoarthritis of right hip 10/24/2015   Hip joint replacement status 10/24/2015   Chest pain 08/25/2015   Right hip pain 08/25/2015   Chest pain, precordial 08/10/2015   Precordial pain 08/10/2015   Cardiomyopathy ([place]) 01/18/2015   Block, bundle branch, left 01/18/2015   Left bundle-branch block 01/18/2015   Diabetes ([place]) 01/18/2015   Nausea alone 12/17/2013   Diarrhea 11/19/2013   Bloating 11/19/2013   Obesity (BMI 30-39.9) 10/21/2013   Multiple joint pain 11/13/2012   MYALGIA 08/29/2010   ABDOMINAL PAIN OTHER SPECIFIED SITE 08/22/2009   OSTEOARTHROS UNSPEC WHETHER GEN/LOC UNSPEC SITE 03/17/2009   Cellulitis and abscess of face 12/17/2008   Disturbance of skin sensation 12/17/2008   Bronchitis 05/07/2008   Anxiety 12/04/2007   SINUSITIS, ACUTE NOS 12/19/2006   Diabetes mellitus, type II ([place]) 10/08/2006   Hyperlipidemia LDL goal <70 10/08/2006   Essential hypertension 10/08/2006   COPD (chronic obstructive pulmonary disease) ([place]) 10/08/2006   GERD 10/08/2006   Other postprocedural status(V45.89) 10/08/2006   Medication Assistance:   None required.  Patient affirms current coverage meets needs.  Assessment / Plan:  Type 2 DM: not at A1c  goal of < 7.0% but Continuous Glucose Monitor report shows that since Ozempic has been added she has been having more lows. Confirmed that patient did stop glimepiride. She will have A1c checked today.  Regarding dexterity issues - could consider either change Ozempic to Rybelsus oral semaglutide or to Trulicity which is a little easier to administer.  Continue Ozempic 0.55m weekly - for now until we get A1c results back.  Patient to contact office if blood glucose < 70 or > 180 for medication adjustment.   Reviewed How to Treat a Low Glucose Level:  If you have a low blood glucose less than 70, please eat / drink 15 grams of carbohydrates (4 oz of juice, soda, 4 glucose tablets, or 3-4 pieces of hard candy).  It is best so choose a "quick" source of sugar that does no contain fat (chocolate and peanut butter might take longer to increase your blood glucose) Wait 15 minutes and then recheck your blood glucose. If your blood glucose is still less than 70, eat another 15 grams of carbohydrates.  Wait another 15 minutes and recheck your glucose.  Continue this until your blood glucose is over 70. Once you blood glucose is over 70, eat a snack with protein in it to prevent your blood glucose from dropping again.  Discussed low carbohydrate diet. Reviewed home blood glucose goals  Fasting blood glucose goal (before meals) = 80 to 130 Blood glucose goal after a meal = less than 180   Also discussed ways to more easily apply the DexCOm 7 sensor. Reminded her to push in the applicator enough to activate the safety feature; also suggest going under her arm to reach back of arm instead of over which is harder reach for her.   Hypertension: at goal of < 140/90 - recommended she restart lisinopril 222mdaily . Cardiomyopathy: on GDT Hyperlipidemia: Dr LoCarollee Herters recheck lipids today.     Follow  Up:  Patient agrees to Care Plan and Follow-up.  Plan: Face to Face appointment with care management team member scheduled for: July 2023.   TaCherre RobinsPharmD Clinical Pharmacist [facility]

## 2021-12-22 NOTE — Assessment & Plan Note (Signed)
Well controlled, no changes to meds. Encouraged heart healthy diet such as the DASH diet and exercise as tolerated.  °

## 2021-12-22 NOTE — Assessment & Plan Note (Signed)
Encourage heart healthy diet such as MIND or DASH diet, increase exercise, avoid trans fats, simple carbohydrates and processed foods, consider a krill or fish or flaxseed oil cap daily.  °

## 2021-12-23 LAB — COMPREHENSIVE METABOLIC PANEL
ALT: 8 IU/L (ref 0–32)
AST: 16 IU/L (ref 0–40)
Albumin/Globulin Ratio: 1.7 (ref 1.2–2.2)
Albumin: 4.3 g/dL (ref 3.8–4.8)
Alkaline Phosphatase: 69 IU/L (ref 44–121)
BUN/Creatinine Ratio: 15 (ref 12–28)
BUN: 16 mg/dL (ref 8–27)
Bilirubin Total: 0.4 mg/dL (ref 0.0–1.2)
CO2: 19 mmol/L — ABNORMAL LOW (ref 20–29)
Calcium: 10 mg/dL (ref 8.7–10.3)
Chloride: 107 mmol/L — ABNORMAL HIGH (ref 96–106)
Creatinine, Ser: 1.07 mg/dL — ABNORMAL HIGH (ref 0.57–1.00)
Globulin, Total: 2.6 g/dL (ref 1.5–4.5)
Glucose: 80 mg/dL (ref 70–99)
Potassium: 4.1 mmol/L (ref 3.5–5.2)
Sodium: 145 mmol/L — ABNORMAL HIGH (ref 134–144)
Total Protein: 6.9 g/dL (ref 6.0–8.5)
eGFR: 57 mL/min/{1.73_m2} — ABNORMAL LOW (ref >59)

## 2021-12-23 LAB — CBC WITH DIFFERENTIAL/PLATELET
Basophils Absolute: 0.1 10*3/uL (ref 0.0–0.2)
Basos: 1 %
EOS (ABSOLUTE): 0.2 10*3/uL (ref 0.0–0.4)
Eos: 3 %
Hematocrit: 36.3 % (ref 34.0–46.6)
Hemoglobin: 12.2 g/dL (ref 11.1–15.9)
Immature Grans (Abs): 0 10*3/uL (ref 0.0–0.1)
Immature Granulocytes: 1 %
Lymphocytes Absolute: 1.8 10*3/uL (ref 0.7–3.1)
Lymphs: 22 %
MCH: 29.2 pg (ref 26.6–33.0)
MCHC: 33.6 g/dL (ref 31.5–35.7)
MCV: 87 fL (ref 79–97)
Monocytes Absolute: 0.5 10*3/uL (ref 0.1–0.9)
Monocytes: 6 %
Neutrophils Absolute: 5.5 10*3/uL (ref 1.4–7.0)
Neutrophils: 67 %
RBC: 4.18 x10E6/uL (ref 3.77–5.28)
RDW: 12.4 % (ref 11.7–15.4)
WBC: 8.1 10*3/uL (ref 3.4–10.8)

## 2021-12-23 LAB — LIPID PANEL
Chol/HDL Ratio: 3 ratio (ref 0.0–4.4)
Cholesterol, Total: 117 mg/dL (ref 100–199)
HDL: 39 mg/dL — ABNORMAL LOW (ref >39)
LDL Chol Calc (NIH): 59 mg/dL (ref 0–99)
Triglycerides: 103 mg/dL (ref 0–149)
VLDL Cholesterol Cal: 19 mg/dL (ref 5–40)

## 2021-12-23 LAB — MICROALBUMIN / CREATININE URINE RATIO
Creatinine, Urine: 76.3 mg/dL
Microalb/Creat Ratio: 65 mg/g creat — ABNORMAL HIGH (ref 0–29)
Microalbumin, Urine: 49.6 ug/mL

## 2021-12-23 LAB — HEMOGLOBIN A1C
Est. average glucose Bld gHb Est-mCnc: 126 mg/dL
Hgb A1c MFr Bld: 6 % — ABNORMAL HIGH (ref 4.8–5.6)

## 2022-01-02 ENCOUNTER — Telehealth: Payer: Self-pay | Admitting: Family Medicine

## 2022-01-02 NOTE — Telephone Encounter (Signed)
Patient states her boil has not gotten any better, and every time she's gone to get it lanced it has come back. She wants a permanent solution for it. Please advise.

## 2022-01-04 ENCOUNTER — Other Ambulatory Visit: Payer: Self-pay

## 2022-01-04 DIAGNOSIS — L089 Local infection of the skin and subcutaneous tissue, unspecified: Secondary | ICD-10-CM

## 2022-01-04 NOTE — Telephone Encounter (Signed)
Referral placed. VM left for patient

## 2022-01-05 ENCOUNTER — Telehealth: Payer: Self-pay | Admitting: Family Medicine

## 2022-01-05 NOTE — Telephone Encounter (Signed)
Pt has a lot of follow up questions regarding labs and referrals. She stated her daughters are concerned about her a1c and want to make sure everything is okay. She wants to know why she was referred to [facility]. It was explained that may have to do with her diabetes.  Please advise.

## 2022-01-08 NOTE — Telephone Encounter (Signed)
Patient called to get an update on her questions given Friday, stated Dr. Etter Sjogren has not have a chance to review but she will get a call when she does. Please advise.

## 2022-01-09 NOTE — Telephone Encounter (Signed)
Left message on machine to call back.  Referral to endo cancelled.

## 2022-01-09 NOTE — Telephone Encounter (Signed)
Was the referral to endo done because of the 12.4 a1c?  Do she still need since it went down?

## 2022-01-10 ENCOUNTER — Telehealth: Payer: Self-pay | Admitting: *Deleted

## 2022-01-10 NOTE — Telephone Encounter (Signed)
Patient wanted to know if she still needed the appointment with you on 01/17/22?

## 2022-01-10 NOTE — Telephone Encounter (Signed)
Patient notified of labs and that referral was cancelled.

## 2022-01-15 NOTE — Telephone Encounter (Signed)
Patient can reschedule if needed or we can change to phone appointment. Since A1c has improved so much and she reported at last visit that she was having a little trouble with giving herself injection of Ozempic I wanted to discuss possibly changing to Rybelsus (oral form for semeglutide) or chagne to Trulicity (if covered by insurance at it might be a little easier for her to self administer.  Unable to reach patient to LM on VM with above information.

## 2022-01-17 ENCOUNTER — Ambulatory Visit: Payer: No Typology Code available for payment source | Admitting: Pharmacist

## 2022-01-17 DIAGNOSIS — E11649 Type 2 diabetes mellitus with hypoglycemia without coma: Secondary | ICD-10-CM

## 2022-01-17 DIAGNOSIS — I10 Essential (primary) hypertension: Secondary | ICD-10-CM

## 2022-01-17 DIAGNOSIS — E1169 Type 2 diabetes mellitus with other specified complication: Secondary | ICD-10-CM

## 2022-01-17 NOTE — Chronic Care Management (AMB) (Signed)
Care Management   Pharmacy Note  01/17/2022 Name: Beth Shepard MRN: [id number] DOB: Jun 04, 1956  Subjective: Beth Shepard is a 66 y.o. year old female who is a primary care patient of Ann Held, DO. The Care Management team was consulted for assistance with care management and care coordination needs.     Engaged with patient by telephone for follow up visit in response to provider referral for pharmacy case management and/or care coordination services.   The patient was given information about Care Management services today including:  Care Management services includes personalized support from designated clinical staff supervised by the patient's primary care provider, including individualized plan of care and coordination with other care providers. 24/7 contact phone numbers for assistance for urgent and routine care needs. The patient may stop case management services at any time by phone call to the office staff.  Patient agreed to services and consent obtained.  Patient was referred for medication management and assistance with type 2 DM, hypertension and other chronic conditions.  Type 2 DM: Improved significantly.  A1c was 12.4%but after starting Ozempic and changing diet her last A1c was 6.0%. At our last visit she was instructed to lower dose of Ozempic to 0.25 weekly due to low blood glucose. Patient reports she has not had a low alert on Dex Com 7 Continuous Glucose Monitor in the last 2 weeks. in May 2023. She states her weight has been stable and she feels that her appetite might be returning.   Today reviewed DexCom information (patient is trending to having fewer lows over the last 30 days. Maybe a little more highs but not a large percentage of time in high range)  Average BG over the last 7 days was 134; last 14 days was 128 and last 30 days 125 For last 7 days Time in range of 70 to 180 was 88%; last 14 days was 91% and last 30 days was 91% For last 7 days  Time blood glucose was very high > 250) was 0%; last 14 days 0% and last 30 days 1%  For last 7 days time blood glucose was high (180 to 250) was 12%; last 14 day was 8% and last 30 days was 6% For last 7 days time blood glucose was low (54 to 70) was 0%; last 14 days was 1% and last 30 days was 2% For last 7 days time blood glucose was very low (< 54) was 0%; last 14 days was 0% and last 30 days was 1% 80);    Hypertension / cardiomyopathy / chronic venous insufficiency: blood pressure was at goal. She is taking carvedilol, furosemide and spironolactone. For some reason she has stopped lisinopril again.   Hyperlipidemia: LDL goal is < 70 and TG < 150. At goal per last lipid panel.   Assessment:  Review of patient status, including review of consultants reports, laboratory and other test data, was performed as part of comprehensive evaluation and provision of chronic care management services.   SDOH (Social Determinants of Health) assessments and interventions performed:  SDOH Interventions    Flowsheet Row Most Recent Value  SDOH Interventions   Physical Activity Interventions Intervention Not Indicated  Transportation Interventions Intervention Not Indicated         Objective:  Wt Readings from Last 3 Encounters:  12/22/21 163 lb (73.9 kg)  11/15/21 168 lb (76.2 kg)  09/18/21 182 lb 3.2 oz (82.6 kg)     Lab Results  Component Value Date  CREATININE 1.07 (H) 12/22/2021   CREATININE 1.17 09/11/2021   CREATININE 1.14 07/06/2021    Lab Results  Component Value Date   HGBA1C 6.0 (H) 12/22/2021       Component Value Date/Time   CHOL 117 12/22/2021 1620   TRIG 103 12/22/2021 1620   HDL 39 (L) 12/22/2021 1620   CHOLHDL 3.0 12/22/2021 1620   CHOLHDL 5 09/11/2021 1321   VLDL 54.2 (H) 09/11/2021 1321   LDLCALC 59 12/22/2021 1620   LDLDIRECT 148.0 09/11/2021 1321    Clinical ASCVD: No  The ASCVD Risk score (Arnett DK, et al., 2019) failed to calculate for the following  reasons:   The valid total cholesterol range is 130 to 320 mg/dL     BP Readings from Last 3 Encounters:  12/22/21 110/80  09/18/21 128/78  09/08/21 118/80    Care Plan  Allergies  Allergen Reactions   Prednisone Palpitations and Other (See Comments)    Dose pack causes vomiting; Causes Gerd N/v/d N/v/d    Codeine Nausea Only and Palpitations    REACTION: palpitation  REACTION: palpitation     Ibuprofen Other (See Comments) and Rash    REACTION: questionable. Pt states that she can take this sometimes. GERD    Medications Reviewed Today     Reviewed by Cherre Robins, RPH-CPP (Pharmacist) on 01/17/22 at 82  Med List Status: <None>   Medication Order Taking? Sig Documenting Provider Last Dose Status Informant  albuterol (PROAIR HFA) 108 (90 Base) MCG/ACT inhaler [id number] Yes INHALE 2 PUFFS INTO THE LUNGS EVERY 6 HOURS AS NEEDED FOR WHEEZE Copland, Gay Filler, MD Taking Active   ALPRAZolam (XANAX) 0.25 MG tablet [id number] Yes Take 1 tablet (0.25 mg total) by mouth 3 (three) times daily as needed. Ann Held, DO Taking Active            Med Note Antony Contras, Taylyn Brame B   Fri Nov 03, 2021  2:37 PM) Usually takes 0.5 tablet  aspirin 81 MG tablet [id number] Yes Take 81 mg by mouth daily. [provider] Taking Active Self  blood glucose meter kit and supplies KIT [id number] No Dispense based on patient and insurance preference. Use up to four times daily as directed.  Patient not taking: Reported on 01/17/2022   Ann Held, DO Not Taking Active   Calcium Carb-Cholecalciferol (CALCIUM-VITAMIN D) 500-200 MG-UNIT tablet [id number] Yes Take 1 tablet by mouth daily. [provider] Taking Active   carvedilol (COREG) 12.5 MG tablet [id number] Yes Take 1 tablet (12.5 mg total) by mouth 2 (two) times daily. Ann Held, DO Taking Active   Continuous Blood Gluc Receiver ([place]) [place] [id number] Yes Use to check blood glucose with DexCom 7  sensors Carollee Herter, Alferd Apa, DO Taking Active   Continuous Blood Gluc Sensor (DEXCOM G7 SENSOR) MISC [id number] Yes Use to check blood glucose continuously Carollee Herter, Alferd Apa, DO Taking Active   fluticasone (FLONASE) 50 MCG/ACT nasal spray [id number] Yes Place 2 sprays into both nostrils daily. Roma Schanz R, DO Taking Active   furosemide (LASIX) 20 MG tablet [id number] Yes Take 1 tablet (20 mg total) by mouth daily. Carollee Herter, Kendrick Fries R, DO Taking Active   gabapentin (NEURONTIN) 100 MG capsule [id number] Yes TAKE 1 CAPSULE BY MOUTH THREE TIMES A DAY Ann Held, DO Taking Active   glucose blood test strip [id number] Yes One Touch Verio Test Strips; Use to check blood glucose twice a day (Dx: uncontrolled  type 2 DM) Carollee Herter, Alferd Apa, DO Taking Active   lisinopril (ZESTRIL) 20 MG tablet [id number] Yes Take 1 tablet (20 mg total) by mouth daily. For blood pressure and kidney protection Ann Held, DO Taking Active   magic mouthwash SOLN [id number] Yes Swish and spit 4 times a day Carollee Herter, Alferd Apa, DO Taking Active   OneTouch Delica Lancets 39I MISC [id number] Yes Use to check sugar 4 times a day.  Dx code E11.9 Carollee Herter, Alferd Apa, DO Taking Active   OZEMPIC, 0.25 OR 0.5 MG/DOSE, 2 MG/3ML SOPN [id number] Yes INJECT 0.5 MG INTO THE SKIN ONCE A WEEK. Ann Held, DO Taking Active   pantoprazole (PROTONIX) 40 MG tablet [id number] Yes Take 1 tablet (40 mg total) by mouth in the morning. ** CALL OFFICE TO SCHEDULE APPOINTMENT Alfredia Ferguson, PA-C Taking Active            Med Note Antony Contras, Analiz Tvedt B   Fri Dec 01, 2021  2:56 PM) Unable to get refills - due to see gastroenterologist  rosuvastatin (CRESTOR) 10 MG tablet [id number] Yes Take 1 tablet (10 mg total) by mouth daily. (Ok to take morning or evening) For cholesterol Carollee Herter, Alferd Apa, DO Taking Active   spironolactone (ALDACTONE) 25 MG tablet [id number] Yes Take 1 tablet (25 mg total) by mouth every other  day. Ann Held, DO Taking Active             Patient Active Problem List   Diagnosis Date Noted   Rheumatoid arthritis involving multiple joints ([place]) 12/22/2021   Type 2 diabetes mellitus with diabetic peripheral angiopathy without gangrene, without long-term current use of insulin ([place]) 12/22/2021   Hyperlipidemia associated with type 2 diabetes mellitus ([place]) 09/18/2021   Memory loss 09/08/2021   Thrush 09/08/2021   IBS (irritable bowel syndrome) 05/11/2020   Uncontrolled type 2 diabetes mellitus with hyperglycemia ([place]) 02/01/2020   Nausea without vomiting 01/27/2019   Preventative health care 12/18/2017   Infected sebaceous cyst of skin 07/19/2017   DCM (dilated cardiomyopathy) ([place]) 07/10/2017   Fibromyalgia 04/05/2017   Chronic pain syndrome 04/05/2017   Pain in both lower extremities 02/06/2017   Chronic venous insufficiency 02/06/2017   Cellulitis of lower extremity 08/28/2016   Pain in right ankle and joints of right foot 08/09/2016   Peripheral edema 07/10/2016   Trochanteric bursitis, right hip 05/03/2016   Chronic kidney disease (CKD) stage G3a/A1, moderately decreased glomerular filtration rate (GFR) between 45-59 mL/min/1.73 square meter and albuminuria creatinine ratio less than 30 mg/g ([place]) 04/07/2016   Primary osteoarthritis of right hip 10/24/2015   Hip joint replacement status 10/24/2015   Chest pain 08/25/2015   Right hip pain 08/25/2015   Chest pain, precordial 08/10/2015   Precordial pain 08/10/2015   Cardiomyopathy ([place]) 01/18/2015   Block, bundle branch, left 01/18/2015   Left bundle-branch block 01/18/2015   Diabetes ([place]) 01/18/2015   Nausea alone 12/17/2013   Diarrhea 11/19/2013   Bloating 11/19/2013   Obesity (BMI 30-39.9) 10/21/2013   Multiple joint pain 11/13/2012   MYALGIA 08/29/2010   ABDOMINAL PAIN OTHER SPECIFIED SITE 08/22/2009   OSTEOARTHROS UNSPEC WHETHER GEN/LOC UNSPEC SITE 03/17/2009   Cellulitis and abscess of face  12/17/2008   Disturbance of skin sensation 12/17/2008   Bronchitis 05/07/2008   Anxiety 12/04/2007   SINUSITIS, ACUTE NOS 12/19/2006   Diabetes mellitus, type II ([place]) 10/08/2006   Hyperlipidemia LDL goal <70 10/08/2006   Essential hypertension 10/08/2006  COPD (chronic obstructive pulmonary disease) ([place]) 10/08/2006   GERD 10/08/2006   Other postprocedural status(V45.89) 10/08/2006   Medication Assistance:  None required.  Patient affirms current coverage meets needs.  Assessment / Plan:  Type 2 DM: Continue Ozempic at 0.12m weekly. If blood glucose trending up or weight increases she can go back to 0.555mdose. Patient reminded NOT to take glimepiride. Called pharmacy to discontinue Rx.  Discussed change to Rybelsus since patient had at last visit mentioned she was having difficulty with injecting Ozempic but today she reports she has been able to give own injection the last 2 injections without issues.  Patient to contact office if blood glucose < 70 or > 180 for medication adjustment.   Reviewed How to Treat a Low Glucose Level:  If you have a low blood glucose less than 70, please eat / drink 15 grams of carbohydrates (4 oz of juice, soda, 4 glucose tablets, or 3-4 pieces of hard candy).  It is best so choose a "quick" source of sugar that does no contain fat (chocolate and peanut butter might take longer to increase your blood glucose) Wait 15 minutes and then recheck your blood glucose. If your blood glucose is still less than 70, eat another 15 grams of carbohydrates.  Wait another 15 minutes and recheck your glucose.  Continue this until your blood glucose is over 70. Once you blood glucose is over 70, eat a snack with protein in it to prevent your blood glucose from dropping again.  Discussed low carbohydrate diet. Reviewed home blood glucose goals  Fasting blood glucose goal (before meals) = 80 to 130 Blood glucose goal after a meal = less than 180   Hypertension: at goal of  < 140/90 - remember to take lisinopril 2061maily it is for blood pressure and to help to protect your kidneys.  Cardiomyopathy: on GDT Hyperlipidemia: continue rosuvastatin 51m46mily.   Follow Up:  Patient agrees to Care Plan and Follow-up.  Plan: Face to Face appointment with care management team member scheduled for: October 202  TammCherre RobinsarmD Clinical Pharmacist [facility]

## 2022-01-17 NOTE — Patient Instructions (Signed)
Beth Shepard It was a pleasure speaking with you  Below is a summary of our visit.   Continue Ozempic at 0.'25mg'$  weekly. If blood glucose starts to increase or weight increases you can go back to 0.'5mg'$  weekly.  No NOT to take glimepiride (I think this could have caused the lows you were having a few weeks ago.  Blood glucose goals  Fasting blood glucose goal (before meals) = 80 to 130 Blood glucose goal after a meal = less than 180   How to Treat a Low Glucose Level:  If you have a low blood glucose less than 70, please eat / drink 15 grams of carbohydrates (4 oz of juice, soda, 4 glucose tablets, or 3-4 pieces of hard candy).  It is best so choose a "quick" source of sugar that does no contain fat (chocolate and peanut butter might take longer to increase your blood glucose) Wait 15 minutes and then recheck your blood glucose. If your blood glucose is still less than 70, eat another 15 grams of carbohydrates.  Wait another 15 minutes and recheck your glucose.  Continue this until your blood glucose is over 70. Once you blood glucose is over 70, eat a snack with protein in it to prevent your blood glucose from dropping again.  Blood pressure goal is < 140/90 - remember to take lisinopril '20mg'$  daily - it is for blood pressure and to help to protect your kidneys.  Cholesterol: continue rosuvastatin '10mg'$  daily - your last cholesterol labs were much better.   If you have any questions or concerns, please feel free to contact me either at the phone number below or with a MyChart message.   Keep up the good work!  Cherre Robins, PharmD Clinical Pharmacist [place] [phone] (direct line)  [phone] (main office number)   Patient verbalizes understanding of instructions and care plan provided today and agrees to view in [name]. Active MyChart status and patient understanding of how to access instructions and care plan via MyChart confirmed with patient.

## 2022-02-24 ENCOUNTER — Other Ambulatory Visit: Payer: Self-pay | Admitting: Family Medicine

## 2022-02-24 DIAGNOSIS — I10 Essential (primary) hypertension: Secondary | ICD-10-CM

## 2022-02-24 DIAGNOSIS — F419 Anxiety disorder, unspecified: Secondary | ICD-10-CM

## 2022-02-24 DIAGNOSIS — E785 Hyperlipidemia, unspecified: Secondary | ICD-10-CM

## 2022-02-27 NOTE — Telephone Encounter (Signed)
Requesting: alprazolam 0.'25mg'$   Contract: 04/05/2017 UDS: 08/30/2016 Last Visit: 12/22/21 Next Visit: None Last Refill: 10/04/21 #90 and 0RF   Please Advise

## 2022-03-06 ENCOUNTER — Telehealth: Payer: Self-pay | Admitting: Family Medicine

## 2022-03-06 DIAGNOSIS — I429 Cardiomyopathy, unspecified: Secondary | ICD-10-CM

## 2022-03-06 DIAGNOSIS — E785 Hyperlipidemia, unspecified: Secondary | ICD-10-CM

## 2022-03-06 DIAGNOSIS — I10 Essential (primary) hypertension: Secondary | ICD-10-CM

## 2022-03-06 NOTE — Telephone Encounter (Signed)
Medication: carvedilol (COREG) 12.5 MG tablet  Has the patient contacted their pharmacy?  no  Preferred Pharmacy (with phone number or street name):    [id number] [place] [address]  [id number] Phone:  [phone] Fax:  [phone] DEA #:  [id number]

## 2022-03-07 MED ORDER — CARVEDILOL 12.5 MG PO TABS: 12.5000 mg | ORAL_TABLET | Freq: Two times a day (BID) | ORAL | 1 refills | Status: DC

## 2022-03-07 NOTE — Telephone Encounter (Signed)
Refill sent.

## 2022-03-09 ENCOUNTER — Telehealth: Payer: Self-pay | Admitting: Pharmacist

## 2022-03-09 NOTE — Telephone Encounter (Signed)
Patient states that she is having trouble with her Continuous Glucose Monitor sensor. Tried to help over phone but I was not able to find what her error message "landscape tip" meant. Provided phone number for [facility] [phone] Will check with patient next week to see if resolved.

## 2022-03-14 ENCOUNTER — Ambulatory Visit: Payer: No Typology Code available for payment source | Admitting: Pharmacist

## 2022-03-14 DIAGNOSIS — E11649 Type 2 diabetes mellitus with hypoglycemia without coma: Secondary | ICD-10-CM

## 2022-03-14 DIAGNOSIS — E785 Hyperlipidemia, unspecified: Secondary | ICD-10-CM

## 2022-03-14 NOTE — Progress Notes (Signed)
Pharmacy Note  03/14/2022 Name: Beth Shepard MRN: [id number] DOB: October 16, 1955  Subjective: Beth Shepard is a 66 y.o. year old female who is a primary care patient of Ann Held, DO. Referred to Clinical Pharmacist Practitioner for medication and diabetes management.   Engaged with patient by telephone for  assisting with Continuous Glucose Monitor and medication review  .   Patient reports she has been unable to log into the [facility] app on her phone.   She also states she feels since Ozempic dose was lowered form 0.28m weekly to 0.236mweekly that she has gained weight but she has not been able to weight herself recently. She is unable to provide recent blood glucose readings today.   Reviewed medication refill history. Patient is past due to fill rosuvastatin and lisinopril. She states she is not in need of refills for either medication and that she has been taking both lisinopril and rosuvastatin every day.    Objective:  Lab Results  Component Value Date   CREATININE 1.07 (H) 12/22/2021   CREATININE 1.17 09/11/2021   CREATININE 1.14 07/06/2021    Lab Results  Component Value Date   HGBA1C 6.0 (H) 12/22/2021       Component Value Date/Time   CHOL 117 12/22/2021 1620   TRIG 103 12/22/2021 1620   HDL 39 (L) 12/22/2021 1620   CHOLHDL 3.0 12/22/2021 1620   CHOLHDL 5 09/11/2021 1321   VLDL 54.2 (H) 09/11/2021 1321   LDLCALC 59 12/22/2021 1620   LDLDIRECT 148.0 09/11/2021 1321     Clinical ASCVD: No  The ASCVD Risk score (Arnett DK, et al., 2019) failed to calculate for the following reasons:   The valid total cholesterol range is 130 to 320 mg/dL    BP Readings from Last 3 Encounters:  12/22/21 110/80  09/18/21 128/78  09/08/21 118/80    Care Plan  Allergies  Allergen Reactions   Prednisone Palpitations and Other (See Comments)    Dose pack causes vomiting; Causes Gerd N/v/d N/v/d    Codeine Nausea Only and Palpitations    REACTION:  palpitation  REACTION: palpitation     Ibuprofen Other (See Comments) and Rash    REACTION: questionable. Pt states that she can take this sometimes. GERD    Medications Reviewed Today     Reviewed by EcCherre RobinsRPH-CPP (Pharmacist) on 03/14/22 at 1164Med List Status: <None>   Medication Order Taking? Sig Documenting Provider Last Dose Status Informant  albuterol (PROAIR HFA) 108 (90 Base) MCG/ACT inhaler [id number] INHALE 2 PUFFS INTO THE LUNGS EVERY 6 HOURS AS NEEDED FOR WHEEZE Copland, JeGay FillerMD Taking Active   ALPRAZolam (XANAX) 0.25 MG tablet [id number] TAKE 1 TABLET BY MOUTH 3 TIMES DAILY AS NEEDED. LoRoma Schanz, DO Taking Active   aspirin 81 MG tablet [id number] Take 81 mg by mouth daily. [provider] Taking Active Self  blood glucose meter kit and supplies KIT [id number] Dispense based on patient and insurance preference. Use up to four times daily as directed. LoRoma Schanz, DO Taking Active   Calcium Carb-Cholecalciferol (CALCIUM-VITAMIN D) 500-200 MG-UNIT tablet [id number] Take 1 tablet by mouth daily. [provider] Taking Active   carvedilol (COREG) 12.5 MG tablet [id number] Take 1 tablet (12.5 mg total) by mouth 2 (two) times daily. LoAnn HeldDO Taking Active   Continuous Blood Gluc Receiver ([id number] Use to check blood glucose  with DexCom 7 sensors Carollee Herter, Alferd Apa, DO Taking Active   Continuous Blood Gluc Sensor (DEXCOM G7 SENSOR) MISC [id number] Yes Use to check blood glucose continuously Carollee Herter, Alferd Apa, DO Taking Active   fluticasone (FLONASE) 50 MCG/ACT nasal spray [id number] Yes Place 2 sprays into both nostrils daily. Roma Schanz R, DO Taking Active   furosemide (LASIX) 20 MG tablet [id number] Yes Take 1 tablet (20 mg total) by mouth daily. Carollee Herter, Kendrick Fries R, DO Taking Active   gabapentin (NEURONTIN) 100 MG capsule [id number] Yes TAKE 1 CAPSULE  BY MOUTH THREE TIMES A DAY Ann Held, DO Taking Active   glucose blood test strip [id number] Yes One Touch Verio Test Strips; Use to check blood glucose twice a day (Dx: uncontrolled type 2 DM) Carollee Herter, Alferd Apa, DO Taking Active   lisinopril (ZESTRIL) 20 MG tablet [id number] Yes Take 1 tablet (20 mg total) by mouth daily. For blood pressure and kidney protection Ann Held, DO Taking Active   magic mouthwash SOLN [id number] Yes Swish and spit 4 times a day Carollee Herter, Alferd Apa, DO Taking Active   OneTouch Delica Lancets 41U MISC [id number] Yes Use to check sugar 4 times a day.  Dx code E11.9 Carollee Herter, Alferd Apa, DO Taking Active   OZEMPIC, 0.25 OR 0.5 MG/DOSE, 2 MG/3ML SOPN [id number] Yes INJECT 0.5 MG INTO THE SKIN ONCE A WEEK. Ann Held, DO Taking Active   pantoprazole (PROTONIX) 40 MG tablet [id number] Yes Take 1 tablet (40 mg total) by mouth in the morning. ** CALL OFFICE TO SCHEDULE APPOINTMENT Alfredia Ferguson, PA-C Taking Active            Med Note Antony Contras, Pieter [place]ooks B   Fri Dec 01, 2021  2:56 PM) Unable to get refills - due to see gastroenterologist  rosuvastatin (CRESTOR) 10 MG tablet [id number] Yes Take 1 tablet (10 mg total) by mouth daily. (Ok to take morning or evening) For cholesterol Carollee Herter, Alferd Apa, DO Taking Active   spironolactone (ALDACTONE) 25 MG tablet [id number] Yes Take 1 tablet (25 mg total) by mouth every other day. Ann Held, DO Taking Active             Patient Active Problem List   Diagnosis Date Noted   Rheumatoid arthritis involving multiple joints ([place]) 12/22/2021   Type 2 diabetes mellitus with diabetic peripheral angiopathy without gangrene, without long-term current use of insulin ([place]) 12/22/2021   Hyperlipidemia associated with type 2 diabetes mellitus ([place]) 09/18/2021   Memory loss 09/08/2021   Thrush 09/08/2021   IBS (irritable bowel syndrome) 05/11/2020   Uncontrolled type 2 diabetes mellitus with  hyperglycemia ([place]) 02/01/2020   Nausea without vomiting 01/27/2019   Preventative health care 12/18/2017   Infected sebaceous cyst of skin 07/19/2017   DCM (dilated cardiomyopathy) ([place]) 07/10/2017   Fibromyalgia 04/05/2017   Chronic pain syndrome 04/05/2017   Pain in both lower extremities 02/06/2017   Chronic venous insufficiency 02/06/2017   Cellulitis of lower extremity 08/28/2016   Pain in right ankle and joints of right foot 08/09/2016   Peripheral edema 07/10/2016   Trochanteric bursitis, right hip 05/03/2016   Chronic kidney disease (CKD) stage G3a/A1, moderately decreased glomerular filtration rate (GFR) between 45-59 mL/min/1.73 square meter and albuminuria creatinine ratio less than 30 mg/g ([place]) 04/07/2016   Primary osteoarthritis of right hip 10/24/2015   Hip joint replacement status 10/24/2015   Chest pain 08/25/2015   Right  hip pain 08/25/2015   Chest pain, precordial 08/10/2015   Precordial pain 08/10/2015   Cardiomyopathy ([place]) 01/18/2015   Block, bundle branch, left 01/18/2015   Left bundle-branch block 01/18/2015   Diabetes ([place]) 01/18/2015   Nausea alone 12/17/2013   Diarrhea 11/19/2013   Bloating 11/19/2013   Obesity (BMI 30-39.9) 10/21/2013   Multiple joint pain 11/13/2012   MYALGIA 08/29/2010   ABDOMINAL PAIN OTHER SPECIFIED SITE 08/22/2009   OSTEOARTHROS UNSPEC WHETHER GEN/LOC UNSPEC SITE 03/17/2009   Cellulitis and abscess of face 12/17/2008   Disturbance of skin sensation 12/17/2008   Bronchitis 05/07/2008   Anxiety 12/04/2007   SINUSITIS, ACUTE NOS 12/19/2006   Diabetes mellitus, type II ([place]) 10/08/2006   Hyperlipidemia LDL goal <70 10/08/2006   Essential hypertension 10/08/2006   COPD (chronic obstructive pulmonary disease) ([place]) 10/08/2006   GERD 10/08/2006   Other postprocedural status(V45.89) 10/08/2006    Medication Assistance:  None required.  Patient affirms current coverage meets needs.  Assessment / Plan:  Type 2 DM - Last A1c at  goal of < 7.0% but patient has not been able to check blood glucose reacently.  Assisted patient in resetting password for [facility] app. After logging in, it appears that patient's last sensor may have not been started. Patient does not have sensor packaging / code needed to start sensor communication with DexCom app.  Patient is unsure about starting sensor herself. Usually her daughter helps. Patient will come buy office tomorrow for face to face visit to start new sensor and for education. Obesity - patient feel that her weight has increased over the last 1 to 2 months. Will check weight tomorrow. If has increased we can consider increasing ozempic back to 0.65m weekly Medication Management - low adherence per refill history for lisinopril and rosuvastatin Asked patient to bring in bottles tomorrow.  Discussed importance in heart/CV and renal health that she take both lisinopril and rosuvastatin every day as prescribed.   TCherre Robins PharmD Clinical Pharmacist LWest [facility] LakeMMiss[facility]

## 2022-03-15 ENCOUNTER — Ambulatory Visit (INDEPENDENT_AMBULATORY_CARE_PROVIDER_SITE_OTHER): Payer: No Typology Code available for payment source | Admitting: Pharmacist

## 2022-03-15 VITALS — BP 115/79 | HR 77 | Wt 152.8 lb

## 2022-03-15 DIAGNOSIS — E11649 Type 2 diabetes mellitus with hypoglycemia without coma: Secondary | ICD-10-CM | POA: Diagnosis not present

## 2022-03-15 NOTE — Progress Notes (Signed)
Pharmacy Note  03/15/2022 Name: Beth Shepard MRN: [id number] DOB: 1956/06/11  Subjective: Beth Shepard is a 66 y.o. year old female who is a primary care patient of Carollee Herter, Alferd Apa, DO. Clinical Pharmacist Practitioner was consulted for assistance with diabetes and medication management.   Engaged with patient face to face for follow up visit in response to provider referral for pharmacy case management and/or care coordination services.   Today Beth Shepard is on office for assistance with starting a new DexCom sensor. She usually has her daughter to assist but the last sensor she placed stopped working.  Placed a DexCom7 sensor today on right upper arm but sensor continued to bleed after insertion and I was afraid that blood has damaged sensor housing. Place another sensor on left arm after removing failed sensor. This time no bleeding. Sensor and patient's phone were connected when patient left office.   Also sent email to practice from patient's DexCom app to invite data sharing.   Patient was concerned that Ozempic 0.20m weekly was not working as well as the 0.544mdose. She states blood glucose has been at goal but she did not feel that it was controlling her appetite. Noted patient has ocntinued to lose weight with 11lb weight loss since 12/22/2021.   Wt Readings from Last 3 Encounters:  03/15/22 152 lb 12.8 oz (69.3 kg)  12/22/21 163 lb (73.9 kg)  11/15/21 168 lb (76.2 kg)    Objective:  Lab Results  Component Value Date   CREATININE 1.07 (H) 12/22/2021   CREATININE 1.17 09/11/2021   CREATININE 1.14 07/06/2021    Lab Results  Component Value Date   HGBA1C 6.0 (H) 12/22/2021       Component Value Date/Time   CHOL 117 12/22/2021 1620   TRIG 103 12/22/2021 1620   HDL 39 (L) 12/22/2021 1620   CHOLHDL 3.0 12/22/2021 1620   CHOLHDL 5 09/11/2021 1321   VLDL 54.2 (H) 09/11/2021 1321   LDLCALC 59 12/22/2021 1620   LDLDIRECT 148.0 09/11/2021 1321    BP  Readings from Last 3 Encounters:  03/15/22 115/79  12/22/21 110/80  09/18/21 128/78    Allergies  Allergen Reactions   Prednisone Palpitations and Other (See Comments)    Dose pack causes vomiting; Causes Gerd N/v/d N/v/d    Codeine Nausea Only and Palpitations    REACTION: palpitation  REACTION: palpitation     Ibuprofen Other (See Comments) and Rash    REACTION: questionable. Pt states that she can take this sometimes. GERD    Medications Reviewed Today     Reviewed by Beth Shepard (Pharmacist) on 03/15/22 at 09Mendonist Status: <None>   Medication Order Taking? Sig Documenting Provider Last Dose Status Informant  albuterol (PROAIR HFA) 108 (90 Base) MCG/ACT inhaler [id number] INHALE 2 PUFFS INTO THE LUNGS EVERY 6 HOURS AS NEEDED FOR WHEEZE Copland, JeGay FillerMD Taking Active   ALPRAZolam (XANAX) 0.25 MG tablet [id number] TAKE 1 TABLET BY MOUTH 3 TIMES DAILY AS NEEDED. LoRoma Schanz, DO Taking Active   aspirin 81 MG tablet [id number] Take 81 mg by mouth daily. [provider] Taking Active Self  blood glucose meter kit and supplies KIT [id number] Dispense based on patient and insurance preference. Use up to four times daily as directed.  Patient not taking: Reported on 03/15/2022   LoAnn HeldDO Not Taking Active   Calcium Carb-Cholecalciferol (CALCIUM-VITAMIN D) 500-200 MG-UNIT tablet [id number]  Take 1 tablet by mouth daily. [provider] Taking Active   carvedilol (COREG) 12.5 MG tablet [id number] Yes Take 1 tablet (12.5 mg total) by mouth 2 (two) times daily. Ann Held, DO Taking Active   Continuous Blood Gluc Receiver ([place]) DEVI [id number] No Use to check blood glucose with DexCom 7 sensors  Patient not taking: Reported on 03/15/2022   Ann Held, DO Not Taking Active   Continuous Blood Gluc Sensor ([place]) [place] [id number] Yes Use to check blood glucose  continuously Carollee Herter, Alferd Apa, DO Taking Active   fluticasone (FLONASE) 50 MCG/ACT nasal spray [id number] Yes Place 2 sprays into both nostrils daily. Roma Schanz R, DO Taking Active   furosemide (LASIX) 20 MG tablet [id number] Yes Take 1 tablet (20 mg total) by mouth daily. Carollee Herter, Kendrick Fries R, DO Taking Active   gabapentin (NEURONTIN) 100 MG capsule [id number] Yes TAKE 1 CAPSULE BY MOUTH THREE TIMES A DAY Ann Held, DO Taking Active   glucose blood test strip [id number] Yes One Touch Verio Test Strips; Use to check blood glucose twice a day (Dx: uncontrolled type 2 DM) Carollee Herter, Alferd Apa, DO Taking Active   lisinopril (ZESTRIL) 20 MG tablet [id number] Yes Take 1 tablet (20 mg total) by mouth daily. For blood pressure and kidney protection Ann Held, DO Taking Active   magic mouthwash SOLN [id number] No Swish and spit 4 times a day  Patient not taking: Reported on 03/15/2022   Ann Held, DO Not Taking Active   OneTouch Delica Lancets 36P MISC [id number] No Use to check sugar 4 times a day.  Dx code E11.9  Patient not taking: Reported on 03/15/2022   Ann Held, DO Not Taking Active   OZEMPIC, 0.25 OR 0.5 MG/DOSE, 2 MG/3ML SOPN [id number] Yes INJECT 0.5 MG INTO THE SKIN ONCE A WEEK. Ann Held, DO Taking Active   pantoprazole (PROTONIX) 40 MG tablet [id number] Yes Take 1 tablet (40 mg total) by mouth in the morning. ** CALL OFFICE TO SCHEDULE APPOINTMENT Alfredia Ferguson, PA-C Taking Active            Med Note Antony Contras, Colvin Blatt B   Fri Dec 01, 2021  2:56 PM) Unable to get refills - due to see gastroenterologist  rosuvastatin (CRESTOR) 10 MG tablet [id number] Yes Take 1 tablet (10 mg total) by mouth daily. (Ok to take morning or evening) For cholesterol Carollee Herter, Alferd Apa, DO Taking Active   spironolactone (ALDACTONE) 25 MG tablet [id number] Yes Take 1 tablet (25 mg total) by mouth every other day. Ann Held, DO Taking Active              Patient Active Problem List   Diagnosis Date Noted   Rheumatoid arthritis involving multiple joints ([place]) 12/22/2021   Type 2 diabetes mellitus with diabetic peripheral angiopathy without gangrene, without long-term current use of insulin ([place]) 12/22/2021   Hyperlipidemia associated with type 2 diabetes mellitus ([place]) 09/18/2021   Memory loss 09/08/2021   Thrush 09/08/2021   IBS (irritable bowel syndrome) 05/11/2020   Uncontrolled type 2 diabetes mellitus with hyperglycemia ([place]) 02/01/2020   Nausea without vomiting 01/27/2019   Preventative health care 12/18/2017   Infected sebaceous cyst of skin 07/19/2017   DCM (dilated cardiomyopathy) ([place]) 07/10/2017   Fibromyalgia 04/05/2017   Chronic pain syndrome 04/05/2017   Pain in both lower extremities 02/06/2017   Chronic  venous insufficiency 02/06/2017   Cellulitis of lower extremity 08/28/2016   Pain in right ankle and joints of right foot 08/09/2016   Peripheral edema 07/10/2016   Trochanteric bursitis, right hip 05/03/2016   Chronic kidney disease (CKD) stage G3a/A1, moderately decreased glomerular filtration rate (GFR) between 45-59 mL/min/1.73 square meter and albuminuria creatinine ratio less than 30 mg/g ([facility]) 04/07/2016   Primary osteoarthritis of right hip 10/24/2015   Hip joint replacement status 10/24/2015   Chest pain 08/25/2015   Right hip pain 08/25/2015   Chest pain, precordial 08/10/2015   Precordial pain 08/10/2015   Cardiomyopathy ([place]) 01/18/2015   Block, bundle branch, left 01/18/2015   Left bundle-branch block 01/18/2015   Diabetes ([place]) 01/18/2015   Nausea alone 12/17/2013   Diarrhea 11/19/2013   Bloating 11/19/2013   Obesity (BMI 30-39.9) 10/21/2013   Multiple joint pain 11/13/2012   MYALGIA 08/29/2010   ABDOMINAL PAIN OTHER SPECIFIED SITE 08/22/2009   OSTEOARTHROS UNSPEC WHETHER GEN/LOC UNSPEC SITE 03/17/2009   Cellulitis and abscess of face 12/17/2008   Disturbance of skin sensation  12/17/2008   Bronchitis 05/07/2008   Anxiety 12/04/2007   SINUSITIS, ACUTE NOS 12/19/2006   Diabetes mellitus, type II ([place]) 10/08/2006   Hyperlipidemia LDL goal <70 10/08/2006   Essential hypertension 10/08/2006   COPD (chronic obstructive pulmonary disease) ([place]) 10/08/2006   GERD 10/08/2006   Other postprocedural status(V45.89) 10/08/2006     Medication Assistance:  None required.  Patient affirms current coverage meets needs.  Assessment  Type 2 DM - controlled Obesity / Overweight - continues to see decrease in weight with current Ozempic dose  Plan: Continue Ozempic 0.60m weekly  Assisted with starting new DexCom sensor and education provided.  Continue to limit intake of high CHO foods.  Will follow up in 1 to 2 weeks by phone to see if any questions regarding Continuous Glucose Monitor.  Due to see PCP in December 2023.    TCherre Robins PharmD Clinical Pharmacist LHaivana NakyaMBirmingham [facility]

## 2022-03-23 ENCOUNTER — Ambulatory Visit: Payer: No Typology Code available for payment source | Admitting: Pharmacist

## 2022-03-23 DIAGNOSIS — E11649 Type 2 diabetes mellitus with hypoglycemia without coma: Secondary | ICD-10-CM

## 2022-03-23 DIAGNOSIS — I10 Essential (primary) hypertension: Secondary | ICD-10-CM

## 2022-03-23 DIAGNOSIS — E785 Hyperlipidemia, unspecified: Secondary | ICD-10-CM

## 2022-03-24 NOTE — Progress Notes (Signed)
Pharmacy Note  03/24/2022 Name: Koralee Wedeking MRN: [id number] DOB: 1955-09-10  Subjective: Beth Shepard is a 66 y.o. year old female who is a primary care patient of Carollee Herter, Alferd Apa, DO. Clinical Pharmacist Practitioner was consulted for assistance with diabetes and medication management.   Engaged with patient by telephone for follow up visit in response to provider referral for pharmacy case management and/or care coordination services.   Type 2 DM - blood glucose has been god recently. Per patient ranges from 100 to 175.  She is wearing DexCom 7 sensor again. Unfortunately the link sent to connect her sensor to our practice did not work so I am not able to see her sensor report today. 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 Discussed which alarms she is getting. Mostly is alarms that she is out of range (when she is away from desk to go to bathroom)   Medication management - reviewed med list and refill history. Patient is past due to refill lisinopril and rosuvastatin. She states that she has plenty of both. There was a tie about 1 or 2 months ago that she had stopped lisinopril due to misunderstanding. She does endorse that she is taking both lisinopril and rosuvastatin regularly  GI / polyps - Patient was noted to have multiple polyps during last colonoscopy 02/18/2019. GI has recommended repeat colonoscopy in 1 year however patient has not followed up with GI. Patient is aware of recommendation and possibly consequences of not getting a repeat colonoscopy.    Wt Readings from Last 3 Encounters:  03/15/22 152 lb 12.8 oz (69.3 kg)  12/22/21 163 lb (73.9 kg)  11/15/21 168 lb (76.2 kg)    Objective:  Lab Results  Component Value Date   CREATININE 1.07 (H) 12/22/2021   CREATININE 1.17 09/11/2021   CREATININE 1.14 07/06/2021    Lab Results  Component Value  Date   HGBA1C 6.0 (H) 12/22/2021       Component Value Date/Time   CHOL 117 12/22/2021 1620   TRIG 103 12/22/2021 1620   HDL 39 (L) 12/22/2021 1620   CHOLHDL 3.0 12/22/2021 1620   CHOLHDL 5 09/11/2021 1321   VLDL 54.2 (H) 09/11/2021 1321   LDLCALC 59 12/22/2021 1620   LDLDIRECT 148.0 09/11/2021 1321    BP Readings from Last 3 Encounters:  03/15/22 115/79  12/22/21 110/80  09/18/21 128/78    Allergies  Allergen Reactions   Prednisone Palpitations and Other (See Comments)    Dose pack causes vomiting; Causes Gerd N/v/d N/v/d    Codeine Nausea Only and Palpitations    REACTION: palpitation  REACTION: palpitation     Ibuprofen Other (See Comments) and Rash    REACTION: questionable. Pt states that she can take this sometimes. GERD    Medications Reviewed Today     Reviewed by Cherre Robins, RPH-CPP (Pharmacist) on 03/24/22 at 0758  Med List Status: <None>   Medication Order Taking? Sig Documenting Provider Last Dose Status Informant  albuterol (PROAIR HFA) 108 (90 Base) MCG/ACT inhaler [id number] Yes INHALE 2 PUFFS INTO THE LUNGS EVERY 6 HOURS AS NEEDED FOR WHEEZE Copland, Gay Filler, MD Taking Active   ALPRAZolam (XANAX) 0.25 MG tablet [id number] Yes TAKE 1 TABLET BY MOUTH 3 TIMES DAILY AS NEEDED. Roma Schanz R, [place] Taking Active   aspirin 81 MG tablet [id number] Yes Take 81 mg  by mouth daily. [provider] Taking Active Self  blood glucose meter kit and supplies KIT [id number] Yes Dispense based on patient and insurance preference. Use up to four times daily as directed. Roma Schanz R, DO Taking Active   Calcium Carb-Cholecalciferol (CALCIUM-VITAMIN D) 500-200 MG-UNIT tablet [id number] Yes Take 1 tablet by mouth daily. [provider] Taking Active   carvedilol (COREG) 12.5 MG tablet [id number] Yes Take 1 tablet (12.5 mg total) by mouth 2 (two) times daily. Ann Held, DO Taking Active   Continuous Blood Gluc Receiver ([place]) [place] [id number] Yes Use to check blood glucose with DexCom 7 sensors Carollee Herter, Alferd Apa, DO Taking Active   Continuous Blood Gluc Sensor (DEXCOM G7 SENSOR) MISC [id number] Yes Use to check blood glucose continuously Carollee Herter, Alferd Apa, DO Taking Active   fluticasone (FLONASE) 50 MCG/ACT nasal spray [id number] Yes Place 2 sprays into both nostrils daily. Roma Schanz R, DO Taking Active   furosemide (LASIX) 20 MG tablet [id number] Yes Take 1 tablet (20 mg total) by mouth daily. Carollee Herter, Kendrick Fries R, DO Taking Active   gabapentin (NEURONTIN) 100 MG capsule [id number] Yes TAKE 1 CAPSULE BY MOUTH THREE TIMES A DAY Ann Held, DO Taking Active   glucose blood test strip [id number] Yes One Touch Verio Test Strips; Use to check blood glucose twice a day (Dx: uncontrolled type 2 DM) Carollee Herter, Alferd Apa, DO Taking Active   lisinopril (ZESTRIL) 20 MG tablet [id number] Yes Take 1 tablet (20 mg total) by mouth daily. For blood pressure and kidney protection Ann Held, DO Taking Active   magic mouthwash SOLN [id number] Yes Swish and spit 4 times a day Carollee Herter, Alferd Apa, DO Taking Active   OneTouch Delica Lancets 33A MISC [id number] Yes Use to check sugar 4 times a day.  Dx code E11.9 Carollee Herter, Alferd Apa, DO Taking Active   OZEMPIC, 0.25 OR 0.5 MG/DOSE, 2 MG/3ML SOPN [id number] Yes INJECT 0.5 MG INTO THE SKIN ONCE A WEEK. Ann Held, DO Taking Active   pantoprazole (PROTONIX) 40 MG tablet [id number] Yes Take 1 tablet (40 mg total) by mouth in the morning. ** CALL OFFICE TO SCHEDULE APPOINTMENT Alfredia Ferguson, PA-C Taking Active            Med Note Antony Contras, TAMMY B   Fri Dec 01, 2021  2:56 PM) Unable to get refills - due to see gastroenterologist  rosuvastatin (CRESTOR) 10 MG tablet [id number] Yes Take 1 tablet (10 mg total) by mouth daily. (Ok to take morning or evening) For cholesterol Carollee Herter, Alferd Apa, DO Taking Active   spironolactone (ALDACTONE) 25 MG  tablet [id number] Yes Take 1 tablet (25 mg total) by mouth every other day. Ann Held, DO Taking Active             Patient Active Problem List   Diagnosis Date Noted   Rheumatoid arthritis involving multiple joints ([place]) 12/22/2021   Type 2 diabetes mellitus with diabetic peripheral angiopathy without gangrene, without long-term current use of insulin ([place]) 12/22/2021   Hyperlipidemia associated with type 2 diabetes mellitus ([place]) 09/18/2021   Memory loss 09/08/2021   Thrush 09/08/2021   IBS (irritable bowel syndrome) 05/11/2020   Uncontrolled type 2 diabetes mellitus with hyperglycemia ([place]) 02/01/2020   Nausea without vomiting 01/27/2019   Preventative health care 12/18/2017   Infected sebaceous cyst of skin 07/19/2017   DCM (dilated cardiomyopathy) ([place])  07/10/2017   Fibromyalgia 04/05/2017   Chronic pain syndrome 04/05/2017   Pain in both lower extremities 02/06/2017   Chronic venous insufficiency 02/06/2017   Cellulitis of lower extremity 08/28/2016   Pain in right ankle and joints of right foot 08/09/2016   Peripheral edema 07/10/2016   Trochanteric bursitis, right hip 05/03/2016   Chronic kidney disease (CKD) stage G3a/A1, moderately decreased glomerular filtration rate (GFR) between 45-59 mL/min/1.73 square meter and albuminuria creatinine ratio less than 30 mg/g (HCC) 04/07/2016   Primary osteoarthritis of right hip 10/24/2015   Hip joint replacement status 10/24/2015   Chest pain 08/25/2015   Right hip pain 08/25/2015   Chest pain, precordial 08/10/2015   Precordial pain 08/10/2015   Cardiomyopathy ([place]) 01/18/2015   Block, bundle branch, left 01/18/2015   Left bundle-branch block 01/18/2015   Diabetes ([place]) 01/18/2015   Nausea alone 12/17/2013   Diarrhea 11/19/2013   Bloating 11/19/2013   Obesity (BMI 30-39.9) 10/21/2013   Multiple joint pain 11/13/2012   MYALGIA 08/29/2010   ABDOMINAL PAIN OTHER SPECIFIED SITE 08/22/2009   OSTEOARTHROS UNSPEC  WHETHER GEN/LOC UNSPEC SITE 03/17/2009   Cellulitis and abscess of face 12/17/2008   Disturbance of skin sensation 12/17/2008   Bronchitis 05/07/2008   Anxiety 12/04/2007   SINUSITIS, ACUTE NOS 12/19/2006   Diabetes mellitus, type II ([place]) 10/08/2006   Hyperlipidemia LDL goal <70 10/08/2006   Essential hypertension 10/08/2006   COPD (chronic obstructive pulmonary disease) ([place]) 10/08/2006   GERD 10/08/2006   Other postprocedural status(V45.89) 10/08/2006     Medication Assistance:  None required.  Patient affirms current coverage meets needs.  Assessment  Type 2 DM - controlled Obesity / Overweight - continues to see decrease in weight with current Ozempic dose History of multiple tubular adenomatous polyps - last colonoscopy 02/18/2019 Medication adherence - refills records indicate low adherence in past.  Plan: Continue Ozempic 0.61m weekly  Assisted with changing alarm setting so that she can control the volume of alarms or change them to vibrate if needed.  Tried to assist patient in sending request to connect Dexcom again but unsucessful. Will complete connection when she is in office again.  Discussed repeat colonoscopy - patient declined Discussed vaccines - patient is planning to get annual flu, updated COVID in the next month. Also discussed getting Shingrix series - will get in the next 3 months.  Reviewed importance of taking rosuvastatin and lisinopril. Reviewed recommended doses. Will continue to follow adherence.  Due to see PCP in December 2023.    TCherre Robins PharmD Clinical Pharmacist [facility]

## 2022-04-23 ENCOUNTER — Other Ambulatory Visit: Payer: Self-pay | Admitting: Family Medicine

## 2022-04-23 DIAGNOSIS — E1165 Type 2 diabetes mellitus with hyperglycemia: Secondary | ICD-10-CM

## 2022-05-03 ENCOUNTER — Telehealth: Payer: Self-pay | Admitting: Pharmacist

## 2022-05-03 NOTE — Telephone Encounter (Signed)
Called patient to remind her to make follow up appointment with PCP for December 2023.  No answer but left message on patient's VM.  Also reminded her to contact [facility] to make appointment for cyst removal if she has not done so yet. Their office had tried to contact Mrs. Beth Shepard 10/25/203.

## 2022-05-05 ENCOUNTER — Other Ambulatory Visit: Payer: Self-pay | Admitting: Family Medicine

## 2022-05-05 ENCOUNTER — Other Ambulatory Visit: Payer: Self-pay | Admitting: Physician Assistant

## 2022-05-05 DIAGNOSIS — I10 Essential (primary) hypertension: Secondary | ICD-10-CM

## 2022-05-05 DIAGNOSIS — E785 Hyperlipidemia, unspecified: Secondary | ICD-10-CM

## 2022-05-08 ENCOUNTER — Telehealth: Payer: Self-pay | Admitting: Family Medicine

## 2022-05-08 ENCOUNTER — Other Ambulatory Visit (HOSPITAL_BASED_OUTPATIENT_CLINIC_OR_DEPARTMENT_OTHER): Payer: Self-pay

## 2022-05-08 DIAGNOSIS — E1165 Type 2 diabetes mellitus with hyperglycemia: Secondary | ICD-10-CM

## 2022-05-08 MED ORDER — OZEMPIC (0.25 OR 0.5 MG/DOSE) 2 MG/3ML ~~LOC~~ SOPN
0.5000 mg | PEN_INJECTOR | SUBCUTANEOUS | 1 refills | Status: DC
  Filled 2022-05-08 – 2022-05-10 (×2): qty 3, 28d supply, fill #0
  Filled 2022-06-04: qty 3, 28d supply, fill #1

## 2022-05-08 NOTE — Telephone Encounter (Signed)
Called daughter back (number provided was actually Megan's number)  [name] reports patient has not been [facility] (patient works for [employer] and copay might be higher if she gets at another pharmacy). [facility] and they do have all 3 strengths of Ozempic in stock.  Sent Rx to [facility] and will see if coapy if different there.  Daughter also is concerned with patient's behavior. Daughter reports patient's work friend said that Mrs. Howells has been more forgetful lately. Patient was advised at our last appointment to make follow up with PCP but has not made appt yet.  Daughter asked if there were any openings 05/15/2022 because she knew Mrs. Sapien has that day off but Dr Etter Sjogren is out of the office 11/21. Patient has surgery to remove cyst on her neck 11/21.  Daughter will check with patient and identify an upcoming date for follow up with PCP in December.

## 2022-05-08 NOTE — Telephone Encounter (Signed)
Beth Shepard (daughter DPR OK) called stating that she wanted to talk to [facility] on her mothers medications and condition. Tammy was unavailable at the time and advised that a message would be routed back to give her a call later on. Beth Shepard acknowledged understanding.

## 2022-05-08 NOTE — Telephone Encounter (Signed)
Received call back from [facility] - unfortunately they are not contacted with patient's insurance.  Will check tomorrow when I am in office to see if we might have a sample to hold patient.  Called daughter Jinny Blossom to let her know. I also mentioned that patient could see NP or [facility] 05/15/2022 or could bee seen sooner if patient is willing to come in. She will discuss with her mother tonight and let me know what they decide tomorrow.

## 2022-05-09 NOTE — Telephone Encounter (Signed)
No samples available in our office. [facility] [place] and was told [facility] for [facility]. Next step will be to contact her insurance plan to see if we can identify alternate pharmacies that patient can use that might have product in stock.

## 2022-05-10 ENCOUNTER — Other Ambulatory Visit (HOSPITAL_BASED_OUTPATIENT_CLINIC_OR_DEPARTMENT_OTHER): Payer: Self-pay

## 2022-05-10 NOTE — Telephone Encounter (Signed)
[facility] to get over ride for patient to be able to get Ozempic at [facility].  [facility] was able to override.  Spoke with [facility] and were able to reprocess $0 copay.

## 2022-05-10 NOTE — Telephone Encounter (Signed)
Left message for patient's daughter regarding Ozempic prescription at [facility].

## 2022-05-15 ENCOUNTER — Other Ambulatory Visit: Payer: Self-pay | Admitting: Surgery

## 2022-05-25 ENCOUNTER — Ambulatory Visit: Payer: No Typology Code available for payment source | Admitting: Family Medicine

## 2022-05-29 ENCOUNTER — Emergency Department (HOSPITAL_BASED_OUTPATIENT_CLINIC_OR_DEPARTMENT_OTHER): Payer: No Typology Code available for payment source

## 2022-05-29 ENCOUNTER — Other Ambulatory Visit: Payer: Self-pay

## 2022-05-29 ENCOUNTER — Emergency Department (HOSPITAL_BASED_OUTPATIENT_CLINIC_OR_DEPARTMENT_OTHER)
Admission: EM | Admit: 2022-05-29 | Discharge: 2022-05-29 | Disposition: A | Discharge status: Home / Self Care | Payer: No Typology Code available for payment source | Attending: Emergency Medicine | Admitting: Emergency Medicine

## 2022-05-29 ENCOUNTER — Encounter (HOSPITAL_BASED_OUTPATIENT_CLINIC_OR_DEPARTMENT_OTHER): Payer: Self-pay | Admitting: Emergency Medicine

## 2022-05-29 DIAGNOSIS — R079 Chest pain, unspecified: Secondary | ICD-10-CM | POA: Insufficient documentation

## 2022-05-29 DIAGNOSIS — J449 Chronic obstructive pulmonary disease, unspecified: Secondary | ICD-10-CM | POA: Diagnosis not present

## 2022-05-29 DIAGNOSIS — Z79899 Other long term (current) drug therapy: Secondary | ICD-10-CM | POA: Insufficient documentation

## 2022-05-29 DIAGNOSIS — E119 Type 2 diabetes mellitus without complications: Secondary | ICD-10-CM | POA: Insufficient documentation

## 2022-05-29 DIAGNOSIS — I509 Heart failure, unspecified: Secondary | ICD-10-CM | POA: Insufficient documentation

## 2022-05-29 DIAGNOSIS — I110 Hypertensive heart disease with heart failure: Secondary | ICD-10-CM | POA: Insufficient documentation

## 2022-05-29 DIAGNOSIS — Z7984 Long term (current) use of oral hypoglycemic drugs: Secondary | ICD-10-CM | POA: Insufficient documentation

## 2022-05-29 LAB — CBC
HCT: 36.5 % (ref 36.0–46.0)
Hemoglobin: 12 g/dL (ref 12.0–15.0)
MCH: 28.8 pg (ref 26.0–34.0)
MCHC: 32.9 g/dL (ref 30.0–36.0)
MCV: 87.5 fL (ref 80.0–100.0)
Platelets: 257 10*3/uL (ref 150–400)
RBC: 4.17 MIL/uL (ref 3.87–5.11)
RDW: 12.9 % (ref 11.5–15.5)
WBC: 7.4 10*3/uL (ref 4.0–10.5)
nRBC: 0 % (ref 0.0–0.2)

## 2022-05-29 LAB — TROPONIN I (HIGH SENSITIVITY)
Troponin I (High Sensitivity): 4 ng/L (ref <18)
Troponin I (High Sensitivity): 4 ng/L (ref <18)

## 2022-05-29 LAB — BASIC METABOLIC PANEL
Anion gap: 8 (ref 5–15)
BUN: 24 mg/dL — ABNORMAL HIGH (ref 8–23)
CO2: 23 mmol/L (ref 22–32)
Calcium: 9.4 mg/dL (ref 8.9–10.3)
Chloride: 108 mmol/L (ref 98–111)
Creatinine, Ser: 1.06 mg/dL — ABNORMAL HIGH (ref 0.44–1.00)
GFR, Estimated: 57 mL/min — ABNORMAL LOW (ref >60)
Glucose, Bld: 100 mg/dL — ABNORMAL HIGH (ref 70–99)
Potassium: 4.1 mmol/L (ref 3.5–5.1)
Sodium: 139 mmol/L (ref 135–145)

## 2022-05-29 LAB — BRAIN NATRIURETIC PEPTIDE: B Natriuretic Peptide: 52 pg/mL (ref 0.0–100.0)

## 2022-05-29 MED ORDER — SODIUM CHLORIDE 0.9 % IV BOLUS
500.0000 mL | Freq: Once | INTRAVENOUS | Status: AC
  Administered 2022-05-29: 500 mL via INTRAVENOUS

## 2022-05-29 MED ORDER — DICYCLOMINE HCL 10 MG/ML IM SOLN
20.0000 mg | Freq: Once | INTRAMUSCULAR | Status: AC
  Administered 2022-05-29: 20 mg via INTRAMUSCULAR
  Filled 2022-05-29: qty 2

## 2022-05-29 NOTE — ED Notes (Signed)
Pt discharged to home. Discharge instructions have been discussed with patient and/or family members. Pt verbally acknowledges understanding d/c instructions, and endorses comprehension to checkout at registration before leaving.  °

## 2022-05-29 NOTE — ED Notes (Signed)
Patient transported to X-ray 

## 2022-05-29 NOTE — ED Triage Notes (Signed)
Last night started to have abd pain, chest pain and weakness. Diarrhea and vomiting last night. Pt states blood pressure this afternoon at work was 82/53.

## 2022-05-29 NOTE — ED Notes (Signed)
Assisted pt to bedside commode.

## 2022-05-29 NOTE — Discharge Instructions (Signed)
Return to the ED with any new or worsening signs or symptoms Please follow-up with your cardiologist this week Please continue pushing fluids to include water, Pedialyte, body armor Please utilize soft diet.  Low-fat, high-protein Please see attached work note

## 2022-05-29 NOTE — ED Provider Notes (Signed)
[facility] EMERGENCY DEPARTMENT Provider Note   CSN: [id number] Arrival date & time: 05/29/22  1440     History  Chief Complaint  Patient presents with   Chest Pain   Weakness    Beth Shepard is a 66 y.o. female with medical history of anxiety, bilateral edema, CHF, COPD, diabetes, GERD, hypertension, IBS, left bundle branch block, rheumatoid arthritis.  Patient presents to ED for evaluation of chest pain.  Patient reports that last night she began experiencing nausea, vomiting and diarrhea.  Patient reports she had multiple episodes of this.  Patient reports that her symptoms seem to resolve this morning and she went to work.  Patient reports that when she got to work she developed chest pain that did not radiate, was not associated with exertion.  The patient also denies any shortness of breath with this.  Patient reports that her chest pain lasted about 2 hours and then resolved.  Patient states that at the worst, the chest pain was about an 8 out of 10.  The patient states that her coworkers noted that she seemed pale so they evaluated her vital signs and noted that she had a blood pressure of 83/58.  Patient states she felt lightheaded and dizzy and weak at this time.  The patient states she has CHF, denies taking fluid pills.  Patient denies any lower extremity edema, fever, shortness of breath, syncope.   Chest Pain Associated symptoms: dizziness, nausea, vomiting and weakness   Associated symptoms: no fever and no shortness of breath   Weakness Associated symptoms: chest pain, diarrhea, dizziness, nausea and vomiting   Associated symptoms: no fever and no shortness of breath        Home Medications Prior to Admission medications   Medication Sig Start Date End Date Taking? Authorizing Provider  albuterol (PROAIR HFA) 108 (90 Base) MCG/ACT inhaler INHALE 2 PUFFS INTO THE LUNGS EVERY 6 HOURS AS NEEDED FOR WHEEZE 07/06/21   Copland, Gay Filler, MD  ALPRAZolam (XANAX)  0.25 MG tablet TAKE 1 TABLET BY MOUTH 3 TIMES DAILY AS NEEDED. 02/27/22   Carollee Herter, Alferd Apa, DO  aspirin 81 MG tablet Take 81 mg by mouth daily.    [provider]  blood glucose meter kit and supplies KIT Dispense based on patient and insurance preference. Use up to four times daily as directed. 09/18/21   Ann Held, DO  Calcium Carb-Cholecalciferol (CALCIUM-VITAMIN D) 500-200 MG-UNIT tablet Take 1 tablet by mouth daily.    [provider]  carvedilol (COREG) 12.5 MG tablet Take 1 tablet (12.5 mg total) by mouth 2 (two) times daily. 03/07/22   Ann Held, DO  Continuous Blood Gluc Receiver (DEXCOM G7 RECEIVER) DEVI Use to check blood glucose with DexCom 7 sensors 11/07/21   Carollee Herter, Kendrick Fries R, DO  Continuous Blood Gluc Sensor (DEXCOM G7 SENSOR) MISC Use to check blood glucose continuously 11/06/21   Carollee Herter, Alferd Apa, DO  fluticasone (FLONASE) 50 MCG/ACT nasal spray Place 2 sprays into both nostrils daily. 05/25/21   Roma Schanz R, DO  furosemide (LASIX) 20 MG tablet TAKE 1 TABLET BY MOUTH EVERY DAY 05/07/22   Carollee Herter, Alferd Apa, DO  gabapentin (NEURONTIN) 100 MG capsule TAKE 1 CAPSULE BY MOUTH THREE TIMES A DAY 10/06/19   Roma Schanz R, DO  glucose blood test strip One Touch Verio Test Strips; Use to check blood glucose twice a day (Dx: uncontrolled type 2 DM) 11/03/21  Carollee Herter, Yvonne R, DO  lisinopril (ZESTRIL) 20 MG tablet Take 1 tablet (20 mg total) by mouth daily. For blood pressure and kidney protection 11/03/21   Carollee Herter, Kendrick Fries R, DO  magic mouthwash SOLN Swish and spit 4 times a day 10/12/21   Carollee Herter, Alferd Apa, DO  OneTouch Delica Lancets 01V MISC Use to check sugar 4 times a day.  Dx code E11.9 11/01/21   Carollee Herter, Alferd Apa, DO  pantoprazole (PROTONIX) 40 MG tablet Take 1 tablet (40 mg total) by mouth in the morning. ** CALL OFFICE TO SCHEDULE APPOINTMENT 05/17/21   [place], Amy S, PA-C  rosuvastatin (CRESTOR) 10  MG tablet Take 1 tablet (10 mg total) by mouth daily. (Ok to take morning or evening) For cholesterol 11/03/21   Carollee Herter, Yvonne R, DO  Semaglutide,0.25 or 0.5MG/DOS, (OZEMPIC, 0.25 OR 0.5 MG/DOSE,) 2 MG/3ML SOPN Inject 0.5 mg into the skin once a week. 05/08/22   Ann Held, DO  spironolactone (ALDACTONE) 25 MG tablet Take 1 tablet (25 mg total) by mouth every other day. 09/08/21   Ann Held, DO      Allergies    Prednisone, Codeine, and Ibuprofen    Review of Systems   Review of Systems  Constitutional:  Negative for fever.  Respiratory:  Negative for shortness of breath.   Cardiovascular:  Positive for chest pain. Negative for leg swelling.  Gastrointestinal:  Positive for diarrhea, nausea and vomiting.  Neurological:  Positive for dizziness, weakness and light-headedness. Negative for syncope.  All other systems reviewed and are negative.   Physical Exam Updated Vital Signs BP (!) 99/56   Pulse 68   Temp 98.1 F (36.7 C)   Resp 18   Ht _0  (1.549 m)   Wt 66.2 kg   SpO2 97%   BMI 27.59 kg/m  Physical Exam Vitals and nursing note reviewed.  Constitutional:      General: She is not in acute distress.    Appearance: She is well-developed.  HENT:     Head: Normocephalic and atraumatic.     Nose: Nose normal.     Mouth/Throat:     Pharynx: Oropharynx is clear.  Eyes:     Conjunctiva/sclera: Conjunctivae normal.  Cardiovascular:     Rate and Rhythm: Normal rate and regular rhythm.     Heart sounds: No murmur heard. Pulmonary:     Effort: Pulmonary effort is normal. No respiratory distress.     Breath sounds: Normal breath sounds.  Abdominal:     Palpations: Abdomen is soft.     Tenderness: There is no abdominal tenderness.  Musculoskeletal:        General: No swelling.     Cervical back: Neck supple.  Skin:    General: Skin is warm and dry.     Capillary Refill: Capillary refill takes less than 2 seconds.  Neurological:     General: No  focal deficit present.     Mental Status: She is alert and oriented to person, place, and time.     GCS: GCS eye subscore is 4. GCS verbal subscore is 5. GCS motor subscore is 6.     Cranial Nerves: Cranial nerves 2-12 are intact. No cranial nerve deficit.     Sensory: Sensation is intact. No sensory deficit.     Motor: Motor function is intact. No weakness.     Coordination: Coordination is intact. Heel to [facility] Test normal.  Psychiatric:  Mood and Affect: Mood normal.     ED Results / Procedures / Treatments   Labs (all labs ordered are listed, but only abnormal results are displayed) Labs Reviewed  BASIC METABOLIC PANEL - Abnormal; Notable for the following components:      Result Value   Glucose, Bld 100 (*)    BUN 24 (*)    Creatinine, Ser 1.06 (*)    GFR, Estimated 57 (*)    All other components within normal limits  CBC  BRAIN NATRIURETIC PEPTIDE  TROPONIN I (HIGH SENSITIVITY)  TROPONIN I (HIGH SENSITIVITY)    EKG EKG Interpretation  Date/Time:  Tuesday May 29 2022 16:12:34 EST Ventricular Rate:  68 PR Interval:  181 QRS Duration: 142 QT Interval:  466 QTC Calculation: 496 R Axis:   4 Text Interpretation: Sinus rhythm IVCD, consider atypical LBBB similar to 2017 Confirmed by Sherwood Gambler [phone]) on 05/29/2022 4:15:46 PM  Radiology DG Chest 2 View  Result Date: 05/29/2022 CLINICAL DATA:  Chest pain EXAM: CHEST - 2 VIEW COMPARISON:  Previous studies including the examination done earlier today FINDINGS: Cardiac size is within normal limits. Increase in AP diameter of chest suggests COPD. Linear densities are seen in the medial right lower lung field suggesting crowding of normal bronchovascular structures or subsegmental atelectasis with no significant interval change. No new infiltrates are seen. There is no pleural effusion or pneumothorax. IMPRESSION: COPD. Increased markings in the medial right lower lung field have not changed. There are no new  infiltrates or signs of pulmonary edema. Electronically Signed   By: Elmer Picker M.D.   On: 05/29/2022 15:48    Procedures Procedures   Medications Ordered in ED Medications  sodium chloride 0.9 % bolus 500 mL (500 mLs Intravenous New Bag/Given 05/29/22 1558)  dicyclomine (BENTYL) injection 20 mg (20 mg Intramuscular Given 05/29/22 1606)    ED Course/ Medical Decision Making/ A&P                           Medical Decision Making Amount and/or Complexity of Data Reviewed Labs: ordered. Radiology: ordered.  Risk Prescription drug management.   66 year old female presents to the ED for evaluation.  Please see HPI for further details.  On my examination the patient is afebrile and nontachycardic.  The patient lung sounds are clear bilaterally, she is not hypoxic.  Patient abdomen is soft and compressible throughout.  Patient neurological examination shows no focal neurodeficits.  Patient work-up will include CBC, BMP, BNP, troponin x2, chest x-ray, EKG.  Patient provided 500 bag of fluid for fluid replenishment, try milligrams of Bentyl for abdominal spasming.  Patient troponin 4 and 4 respectively.  Patient BNP not elevated at 52.  Patient CBC without leukocytosis or anemia.  The patient BMP has an elevated creatinine 1.06 which is in line with the patient baseline.  Patient chest x-ray shows no consolidations or effusions.  Patient EKG nonischemic.  After fluid replenishment, patient reports he feels much better.  Patient was ambulated around the department and was shown to have steady gait.  At this time, the patient is stable for discharge.  I have advised the patient to follow back up with her cardiologist.  I encouraged the patient to return to the ED with any new or worsening signs or symptoms.  Patient daughter at bedside states that she will stay with her mother tonight.  Patient had all of her questions answered to her satisfaction.  The patient  is stable for  discharge.  Final Clinical Impression(s) / ED Diagnoses Final diagnoses:  Chest pain, unspecified type    Rx / DC Orders ED Discharge Orders     None         Azucena Cecil, PA-C 05/29/22 1750    Lennice Sites, DO 05/30/22 0008

## 2022-05-29 NOTE — ED Notes (Signed)
Spoke with Bland Span in lab to add on BNP

## 2022-05-29 NOTE — ED Notes (Signed)
ED Provider at bedside. 

## 2022-06-04 ENCOUNTER — Telehealth: Payer: Self-pay | Admitting: Family Medicine

## 2022-06-04 ENCOUNTER — Other Ambulatory Visit (HOSPITAL_BASED_OUTPATIENT_CLINIC_OR_DEPARTMENT_OTHER): Payer: Self-pay

## 2022-06-04 DIAGNOSIS — E1165 Type 2 diabetes mellitus with hyperglycemia: Secondary | ICD-10-CM

## 2022-06-04 NOTE — Telephone Encounter (Signed)
Medication: Semaglutide,0.25 or 0.'5MG'$ /DOS, (OZEMPIC, 0.25 OR 0.5 MG/DOSE,) 2 MG/3ML SOPN  Has the patient contacted their pharmacy? No.   Preferred Pharmacy:   [facility] [address], [place], [place] [address] Phone: [phone]  Fax: [phone]

## 2022-06-05 NOTE — Telephone Encounter (Signed)
Pt currently on 0.5. Would you like to increase it or keep it the same

## 2022-06-06 ENCOUNTER — Other Ambulatory Visit (HOSPITAL_BASED_OUTPATIENT_CLINIC_OR_DEPARTMENT_OTHER): Payer: Self-pay

## 2022-06-06 ENCOUNTER — Telehealth: Payer: Self-pay | Admitting: Pharmacist

## 2022-06-06 MED ORDER — OZEMPIC (0.25 OR 0.5 MG/DOSE) 2 MG/3ML ~~LOC~~ SOPN
0.5000 mg | PEN_INJECTOR | SUBCUTANEOUS | 1 refills | Status: DC
  Filled 2022-06-06 (×2): qty 3, 28d supply, fill #0
  Filled 2022-07-17 – 2022-07-25 (×2): qty 3, 28d supply, fill #1

## 2022-06-06 NOTE — Telephone Encounter (Signed)
    Called patient.  She wanted to know why she gets 6 pen needles in her Ozempic box but she only needs 4. Explained that there are 2 doses 0.'25mg'$  and 0.'5mg'$ . If a pt uses 0.'25mg'$  then 1 pen lasts 6 doses but 0.'5mg'$  1 pen lasts 4 doses.   [facility]. Patient is not sure what to do to get Ozempic. She tells about a friend that called a number to get it. I explained that we got an override last month for her to get Ozempic at [facility] [place] we can try that again if Ozempic is in stock.   Blood glucose was very elevated yesterday morning - in 270's after eating cereal, coffee and artificial sweetener. This is the first time she has received high alert alarm. Last Ozempic dose was Sunday, December 10th. Her daughter does not feel that she got a full dose.   [place] - they do have Ozempic in stock. [facility] and they provided an override to fill this month to get at the [employer].  Patient notified and she will pick up later this week.

## 2022-06-06 NOTE — Telephone Encounter (Signed)
Attempted to call patient and daughter to discuss if we needed to increase medication. LVM for both. Refill sent

## 2022-06-15 ENCOUNTER — Ambulatory Visit (INDEPENDENT_AMBULATORY_CARE_PROVIDER_SITE_OTHER): Payer: No Typology Code available for payment source | Admitting: Family Medicine

## 2022-06-15 VITALS — BP 100/68 | HR 77 | Temp 97.6°F | Resp 18 | Ht 61.0 in | Wt 145.2 lb

## 2022-06-15 DIAGNOSIS — E1165 Type 2 diabetes mellitus with hyperglycemia: Secondary | ICD-10-CM | POA: Diagnosis not present

## 2022-06-15 DIAGNOSIS — E785 Hyperlipidemia, unspecified: Secondary | ICD-10-CM

## 2022-06-15 DIAGNOSIS — I959 Hypotension, unspecified: Secondary | ICD-10-CM | POA: Diagnosis not present

## 2022-06-15 DIAGNOSIS — I10 Essential (primary) hypertension: Secondary | ICD-10-CM

## 2022-06-15 DIAGNOSIS — M069 Rheumatoid arthritis, unspecified: Secondary | ICD-10-CM

## 2022-06-15 DIAGNOSIS — E1169 Type 2 diabetes mellitus with other specified complication: Secondary | ICD-10-CM

## 2022-06-15 DIAGNOSIS — R413 Other amnesia: Secondary | ICD-10-CM

## 2022-06-15 LAB — COMPREHENSIVE METABOLIC PANEL
ALT: 10 U/L (ref 0–35)
AST: 17 U/L (ref 0–37)
Albumin: 4.2 g/dL (ref 3.5–5.2)
Alkaline Phosphatase: 60 U/L (ref 39–117)
BUN: 16 mg/dL (ref 6–23)
CO2: 29 mEq/L (ref 19–32)
Calcium: 9.9 mg/dL (ref 8.4–10.5)
Chloride: 105 mEq/L (ref 96–112)
Creatinine, Ser: 1.04 mg/dL (ref 0.40–1.20)
GFR: 55.07 mL/min — ABNORMAL LOW (ref >60.00)
Glucose, Bld: 81 mg/dL (ref 70–99)
Potassium: 4.2 mEq/L (ref 3.5–5.1)
Sodium: 142 mEq/L (ref 135–145)
Total Bilirubin: 0.5 mg/dL (ref 0.2–1.2)
Total Protein: 7.1 g/dL (ref 6.0–8.3)

## 2022-06-15 LAB — CBC WITH DIFFERENTIAL/PLATELET
Basophils Absolute: 0.1 10*3/uL (ref 0.0–0.1)
Basophils Relative: 1 % (ref 0.0–3.0)
Eosinophils Absolute: 0.1 10*3/uL (ref 0.0–0.7)
Eosinophils Relative: 2 % (ref 0.0–5.0)
HCT: 37.5 % (ref 36.0–46.0)
Hemoglobin: 12.4 g/dL (ref 12.0–15.0)
Lymphocytes Relative: 24 % (ref 12.0–46.0)
Lymphs Abs: 1.7 10*3/uL (ref 0.7–4.0)
MCHC: 33.1 g/dL (ref 30.0–36.0)
MCV: 86.4 fl (ref 78.0–100.0)
Monocytes Absolute: 0.5 10*3/uL (ref 0.1–1.0)
Monocytes Relative: 6.6 % (ref 3.0–12.0)
Neutro Abs: 4.8 10*3/uL (ref 1.4–7.7)
Neutrophils Relative %: 66.4 % (ref 43.0–77.0)
Platelets: 157 10*3/uL (ref 150.0–400.0)
RBC: 4.34 Mil/uL (ref 3.87–5.11)
RDW: 13.2 % (ref 11.5–15.5)
WBC: 7.2 10*3/uL (ref 4.0–10.5)

## 2022-06-15 LAB — LIPID PANEL
Cholesterol: 162 mg/dL (ref 0–200)
HDL: 56.5 mg/dL (ref >39.00)
LDL Cholesterol: 95 mg/dL (ref 0–99)
NonHDL: 105.07
Total CHOL/HDL Ratio: 3
Triglycerides: 52 mg/dL (ref 0.0–149.0)
VLDL: 10.4 mg/dL (ref 0.0–40.0)

## 2022-06-15 LAB — MICROALBUMIN / CREATININE URINE RATIO
Creatinine,U: 109 mg/dL
Microalb Creat Ratio: 1.7 mg/g (ref 0.0–30.0)
Microalb, Ur: 1.9 mg/dL (ref 0.0–1.9)

## 2022-06-15 LAB — HEMOGLOBIN A1C: Hgb A1c MFr Bld: 5.9 % (ref 4.6–6.5)

## 2022-06-15 MED ORDER — CARVEDILOL 6.25 MG PO TABS: 6.2500 mg | ORAL_TABLET | Freq: Two times a day (BID) | ORAL | 3 refills | Status: DC

## 2022-06-15 NOTE — Progress Notes (Signed)
Established Patient Office Visit  Subjective   Patient ID: Beth Shepard, female    DOB: [birth year]  Age: 66 y.o. MRN: [id number]  Chief Complaint  Patient presents with   Diabetes   Hypertension   Follow-up    HPI Pt is here with her daughter ---  c/o weight loss due to ozempic and dec appetite and trouble with memory and concentration.  Bp is also low   Patient Active Problem List   Diagnosis Date Noted   Rheumatoid arthritis involving multiple joints ([place]) 12/22/2021   Type 2 diabetes mellitus with diabetic peripheral angiopathy without gangrene, without long-term current use of insulin ([place]) 12/22/2021   Hyperlipidemia associated with type 2 diabetes mellitus ([place]) 09/18/2021   Memory loss 09/08/2021   Thrush 09/08/2021   IBS (irritable bowel syndrome) 05/11/2020   Uncontrolled type 2 diabetes mellitus with hyperglycemia ([place]) 02/01/2020   Nausea without vomiting 01/27/2019   Preventative health care 12/18/2017   Infected sebaceous cyst of skin 07/19/2017   DCM (dilated cardiomyopathy) ([place]) 07/10/2017   Fibromyalgia 04/05/2017   Chronic pain syndrome 04/05/2017   Pain in both lower extremities 02/06/2017   Chronic venous insufficiency 02/06/2017   Cellulitis of lower extremity 08/28/2016   Pain in right ankle and joints of right foot 08/09/2016   Peripheral edema 07/10/2016   Trochanteric bursitis, right hip 05/03/2016   Chronic kidney disease (CKD) stage G3a/A1, moderately decreased glomerular filtration rate (GFR) between 45-59 mL/min/1.73 square meter and albuminuria creatinine ratio less than 30 mg/g ([place]) 04/07/2016   Primary osteoarthritis of right hip 10/24/2015   Hip joint replacement status 10/24/2015   Chest pain 08/25/2015   Right hip pain 08/25/2015   Chest pain, precordial 08/10/2015   Precordial pain 08/10/2015   Cardiomyopathy ([place]) 01/18/2015   Block, bundle branch, left 01/18/2015   Left bundle-branch block 01/18/2015   Diabetes ([place]) 01/18/2015    Nausea alone 12/17/2013   Diarrhea 11/19/2013   Bloating 11/19/2013   Obesity (BMI 30-39.9) 10/21/2013   Multiple joint pain 11/13/2012   MYALGIA 08/29/2010   ABDOMINAL PAIN OTHER SPECIFIED SITE 08/22/2009   OSTEOARTHROS UNSPEC WHETHER GEN/LOC UNSPEC SITE 03/17/2009   Cellulitis and abscess of face 12/17/2008   Disturbance of skin sensation 12/17/2008   Bronchitis 05/07/2008   Anxiety 12/04/2007   SINUSITIS, ACUTE NOS 12/19/2006   Diabetes mellitus, type II ([place]) 10/08/2006   Hyperlipidemia LDL goal <70 10/08/2006   Essential hypertension 10/08/2006   COPD (chronic obstructive pulmonary disease) ([place]) 10/08/2006   GERD 10/08/2006   Other postprocedural status(V45.89) 10/08/2006   Past Medical History:  Diagnosis Date   Anxiety    Bilateral edema of lower extremity    Swelling in legs and ankles   Congestive heart failure (HCC)    COPD (chronic obstructive pulmonary disease) ([place])    Diabetes mellitus    type 2   Gastric polyps    GERD (gastroesophageal reflux disease)    chronic   Hyperlipidemia    Hypertension    IBS (irritable bowel syndrome)    LBBB (left bundle branch block)    Dr. Gwenith Spitz, [facility]   Pneumonia    hx of   PONV (postoperative nausea and vomiting)    Had N/V after having tubal ligation and D&C   Rheumatoid arthritis ([place])    Shingles    Past Surgical History:  Procedure Laterality Date   CARDIAC CATHETERIZATION     No PCI   CHOLECYSTECTOMY     COLONOSCOPY  06/06/2005  normal    DILATION AND CURETTAGE OF UTERUS     ESOPHAGOGASTRODUODENOSCOPY  06/06/2005, 11/17/2013   JOINT REPLACEMENT Right 10/24/2015   rt hip replacement   KNEE ARTHROSCOPY Left    TOTAL HIP ARTHROPLASTY Right 10/24/2015   Procedure: RIGHT TOTAL HIP ARTHROPLASTY ANTERIOR APPROACH;  Surgeon: Leandrew Koyanagi, MD;  Location: [facility];  Service: Orthopedics;  Laterality: Right;   TUBAL LIGATION     VAGINAL HYSTERECTOMY     Social History   Tobacco Use   Smoking  status: Former    Years: 33.00    Types: Cigarettes    Quit date: 06/25/2002    Years since quitting: 20.0   Smokeless tobacco: Never  Vaping Use   Vaping Use: Never used  Substance Use Topics   Alcohol use: Yes    Comment: rare   Drug use: No   Social History   Socioeconomic History   Marital status: Widowed    Spouse name: Not on file   Number of children: 2   Years of education: Not on file   Highest education level: Not on file  Occupational History   Occupation: Administive     Employer: [employer]  Tobacco Use   Smoking status: Former    Years: 33.00    Types: Cigarettes    Quit date: 06/25/2002    Years since quitting: 20.0   Smokeless tobacco: Never  Vaping Use   Vaping Use: Never used  Substance and Sexual Activity   Alcohol use: Yes    Comment: rare   Drug use: No   Sexual activity: Not Currently    Partners: Male  Other Topics Concern   Not on file  Social History Narrative   Limited exercise per cardiology   Social Determinants of Health   Financial Resource Strain: Low Risk  (11/07/2021)   Overall Financial Resource Strain (CARDIA)    Difficulty of Paying Living Expenses: Not very hard  Food Insecurity: Not on file  Transportation Needs: No Transportation Needs (01/17/2022)   PRAPARE - Hydrologist (Medical): No    Lack of Transportation (Non-Medical): No  Physical Activity: Insufficiently Active (01/17/2022)   Exercise Vital Sign    Days of Exercise per Week: 3 days    Minutes of Exercise per Session: 30 min  Stress: Not on file  Social Connections: Not on file  Intimate Partner Violence: Not on file   Family Status  Relation Name Status   Mother  Deceased at age 51   Sister  41   MGM  Deceased at age 31       MI   MGF  Deceased   PGM  Deceased   PGF  Deceased   Father  Deceased at age 54       septic shock   Daughter  Alive   Daughter  Alive   Neg Hx  (Not Specified)   Family History   Problem Relation Age of Onset   Lymphoma Mother    Heart disease Mother        chf, cabg x6   Hypertension Mother    GER disease Mother    Hiatal hernia Mother    Gallstones Mother    Brain cancer Sister        brain stage 4   Heart disease Maternal Grandmother 43       MI   Heart disease Maternal Grandfather    Stroke Paternal Grandmother    Hypertension Paternal  Grandmother    Stroke Paternal Grandfather    Hypertension Paternal Grandfather    Rheum arthritis Father    Hypertension Daughter    Colon cancer Neg Hx    Esophageal cancer Neg Hx    Pancreatic cancer Neg Hx    Liver disease Neg Hx    Kidney disease Neg Hx    Allergies  Allergen Reactions   Prednisone Palpitations and Other (See Comments)    Dose pack causes vomiting; Causes Gerd N/v/d N/v/d    Codeine Nausea Only and Palpitations    REACTION: palpitation  REACTION: palpitation     Ibuprofen Other (See Comments) and Rash    REACTION: questionable. Pt states that she can take this sometimes. GERD      Review of Systems  Constitutional:  Negative for fever and malaise/fatigue.  HENT:  Negative for congestion.   Eyes:  Negative for blurred vision.  Respiratory:  Negative for cough and shortness of breath.   Cardiovascular:  Negative for chest pain, palpitations and leg swelling.  Gastrointestinal:  Negative for vomiting.  Musculoskeletal:  Negative for back pain.  Skin:  Negative for rash.  Neurological:  Negative for loss of consciousness and headaches.      Objective:     BP 100/68 (BP Location: Left Arm, Patient Position: Sitting, Cuff Size: Normal)   Pulse 77   Temp 97.6 F (36.4 C) (Oral)   Resp 18   Ht '5\' 1"'$  (1.549 m)   Wt 145 lb 3.2 oz (65.9 kg)   SpO2 96%   BMI 27.44 kg/m  BP Readings from Last 3 Encounters:  06/15/22 100/68  05/29/22 103/64  03/15/22 115/79   Wt Readings from Last 3 Encounters:  06/15/22 145 lb 3.2 oz (65.9 kg)  05/29/22 146 lb (66.2 kg)  03/15/22 152 lb  12.8 oz (69.3 kg)   SpO2 Readings from Last 3 Encounters:  06/15/22 96%  05/29/22 99%  12/22/21 96%      Physical Exam Vitals and nursing note reviewed.  Constitutional:      Appearance: She is well-developed.  HENT:     Head: Normocephalic and atraumatic.  Eyes:     Conjunctiva/sclera: Conjunctivae normal.  Neck:     Thyroid: No thyromegaly.     Vascular: No carotid bruit or JVD.  Cardiovascular:     Rate and Rhythm: Normal rate and regular rhythm.     Heart sounds: Normal heart sounds. No murmur heard. Pulmonary:     Effort: Pulmonary effort is normal. No respiratory distress.     Breath sounds: Normal breath sounds. No wheezing or rales.  Chest:     Chest wall: No tenderness.  Musculoskeletal:     Cervical back: Normal range of motion and neck supple.  Neurological:     Mental Status: She is alert and oriented to person, place, and time.  Psychiatric:        Mood and Affect: Mood normal.        Speech: Speech normal.        Behavior: Behavior is not agitated, slowed, aggressive, withdrawn, hyperactive or combative. Behavior is cooperative.        Thought Content: Thought content is not paranoid or delusional. Thought content does not include homicidal or suicidal ideation. Thought content does not include homicidal or suicidal plan.        Cognition and Memory: Cognition is impaired. Memory is impaired.      Results for orders placed or performed in visit on 06/15/22  Lipid panel  Result Value Ref Range   Cholesterol 162 0 - 200 mg/dL   Triglycerides 52.0 0.0 - 149.0 mg/dL   HDL 56.50 >39.00 mg/dL   VLDL 10.4 0.0 - 40.0 mg/dL   LDL Cholesterol 95 0 - 99 mg/dL   Total CHOL/HDL Ratio 3    NonHDL 105.07   CBC with Differential/Platelet  Result Value Ref Range   WBC 7.2 4.0 - 10.5 K/uL   RBC 4.34 3.87 - 5.11 Mil/uL   Hemoglobin 12.4 12.0 - 15.0 g/dL   HCT 37.5 36.0 - 46.0 %   MCV 86.4 78.0 - 100.0 fl   MCHC 33.1 30.0 - 36.0 g/dL   RDW 13.2 11.5 - 15.5 %    Platelets 157.0 150.0 - 400.0 K/uL   Neutrophils Relative % 66.4 43.0 - 77.0 %   Lymphocytes Relative 24.0 12.0 - 46.0 %   Monocytes Relative 6.6 3.0 - 12.0 %   Eosinophils Relative 2.0 0.0 - 5.0 %   Basophils Relative 1.0 0.0 - 3.0 %   Neutro Abs 4.8 1.4 - 7.7 K/uL   Lymphs Abs 1.7 0.7 - 4.0 K/uL   Monocytes Absolute 0.5 0.1 - 1.0 K/uL   Eosinophils Absolute 0.1 0.0 - 0.7 K/uL   Basophils Absolute 0.1 0.0 - 0.1 K/uL  Comprehensive metabolic panel  Result Value Ref Range   Sodium 142 135 - 145 mEq/L   Potassium 4.2 3.5 - 5.1 mEq/L   Chloride 105 96 - 112 mEq/L   CO2 29 19 - 32 mEq/L   Glucose, Bld 81 70 - 99 mg/dL   BUN 16 6 - 23 mg/dL   Creatinine, Ser 1.04 0.40 - 1.20 mg/dL   Total Bilirubin 0.5 0.2 - 1.2 mg/dL   Alkaline Phosphatase 60 39 - 117 U/L   AST 17 0 - 37 U/L   ALT 10 0 - 35 U/L   Total Protein 7.1 6.0 - 8.3 g/dL   Albumin 4.2 3.5 - 5.2 g/dL   GFR 55.07 (L) >60.00 mL/min   Calcium 9.9 8.4 - 10.5 mg/dL  Hemoglobin A1c  Result Value Ref Range   Hgb A1c MFr Bld 5.9 4.6 - 6.5 %  Microalbumin / creatinine urine ratio  Result Value Ref Range   Microalb, Ur 1.9 0.0 - 1.9 mg/dL   Creatinine,U 109.0 mg/dL   Microalb Creat Ratio 1.7 0.0 - 30.0 mg/g    Last CBC Lab Results  Component Value Date   WBC 7.2 06/15/2022   HGB 12.4 06/15/2022   HCT 37.5 06/15/2022   MCV 86.4 06/15/2022   MCH 28.8 05/29/2022   RDW 13.2 06/15/2022   PLT 157.0 79/39/0300   Last metabolic panel Lab Results  Component Value Date   GLUCOSE 81 06/15/2022   NA 142 06/15/2022   K 4.2 06/15/2022   CL 105 06/15/2022   CO2 29 06/15/2022   BUN 16 06/15/2022   CREATININE 1.04 06/15/2022   GFRNONAA 57 (L) 05/29/2022   CALCIUM 9.9 06/15/2022   PROT 7.1 06/15/2022   ALBUMIN 4.2 06/15/2022   LABGLOB 2.6 12/22/2021   AGRATIO 1.7 12/22/2021   BILITOT 0.5 06/15/2022   ALKPHOS 60 06/15/2022   AST 17 06/15/2022   ALT 10 06/15/2022   ANIONGAP 8 05/29/2022   Last lipids Lab Results   Component Value Date   CHOL 162 06/15/2022   HDL 56.50 06/15/2022   LDLCALC 95 06/15/2022   LDLDIRECT 148.0 09/11/2021   TRIG 52.0 06/15/2022   CHOLHDL 3 06/15/2022  Last hemoglobin A1c Lab Results  Component Value Date   HGBA1C 5.9 06/15/2022   Last thyroid functions Lab Results  Component Value Date   TSH 2.27 07/06/2021   Last vitamin D Lab Results  Component Value Date   VD25OH 16 (L) 08/29/2010   Last vitamin B12 and Folate Lab Results  Component Value Date   VITAMINB12 281 01/29/2020      The 10-year ASCVD risk score (Arnett DK, et al., 2019) is: 10.9%    Assessment & Plan:   Problem List Items Addressed This Visit       Unprioritized   Uncontrolled type 2 diabetes mellitus with hyperglycemia ([place]) - Primary    hgba1c to be checked,  minimize simple carbs. Increase exercise as tolerated. Continue current meds Pt losing weight and sugars are low---- lower ozrmpic dose        Relevant Orders   Lipid panel (Completed)   CBC with Differential/Platelet (Completed)   Comprehensive metabolic panel (Completed)   Hemoglobin A1c (Completed)   Microalbumin / creatinine urine ratio (Completed)   POCT Urinalysis Dipstick (Automated)   Referral to Nutrition and Diabetes Services   Rheumatoid arthritis involving multiple joints [facility])    Per rheumatology      Memory loss    Refer to neuropsych      Hyperlipidemia LDL goal <70   Relevant Medications   carvedilol (COREG) 6.25 MG tablet   Other Relevant Orders   Lipid panel (Completed)   CBC with Differential/Platelet (Completed)   Comprehensive metabolic panel (Completed)   Hemoglobin A1c (Completed)   Microalbumin / creatinine urine ratio (Completed)   POCT Urinalysis Dipstick (Automated)   Hyperlipidemia associated with type 2 diabetes mellitus ([place])    Tolerating statin, encouraged heart healthy diet, avoid trans fats, minimize simple carbs and saturated fats. Increase exercise as tolerated        Relevant Medications   carvedilol (COREG) 6.25 MG tablet   Essential hypertension    Running low due to weight loss Lower dose coreg 6.25 bid F/u 2-3 weeks       Relevant Medications   carvedilol (COREG) 6.25 MG tablet   Other Relevant Orders   Lipid panel (Completed)   CBC with Differential/Platelet (Completed)   Comprehensive metabolic panel (Completed)   Hemoglobin A1c (Completed)   Microalbumin / creatinine urine ratio (Completed)   POCT Urinalysis Dipstick (Automated)   Other Visit Diagnoses     Hypotension, unspecified hypotension type       Relevant Medications   carvedilol (COREG) 6.25 MG tablet   Other Relevant Orders   Lipid panel (Completed)   CBC with Differential/Platelet (Completed)   Comprehensive metabolic panel (Completed)   Hemoglobin A1c (Completed)   Microalbumin / creatinine urine ratio (Completed)   POCT Urinalysis Dipstick (Automated)   Memory deficit       Relevant Orders   Ambulatory referral to Neuropsychology       Return in about 3 months (around 09/14/2022), or if symptoms worsen or fail to improve, for annual exam, fasting.    Ann Held, DO

## 2022-06-15 NOTE — Patient Instructions (Signed)

## 2022-06-20 ENCOUNTER — Encounter: Payer: Self-pay | Admitting: Family Medicine

## 2022-06-20 NOTE — Assessment & Plan Note (Signed)
Running low due to weight loss Lower dose coreg 6.25 bid F/u 2-3 weeks

## 2022-06-20 NOTE — Assessment & Plan Note (Signed)
Per rheumatology

## 2022-06-20 NOTE — Assessment & Plan Note (Signed)
Tolerating statin, encouraged heart healthy diet, avoid trans fats, minimize simple carbs and saturated fats. Increase exercise as tolerated 

## 2022-06-20 NOTE — Assessment & Plan Note (Signed)
Refer to neuropsych

## 2022-06-20 NOTE — Assessment & Plan Note (Addendum)
hgba1c to be checked,  minimize simple carbs. Increase exercise as tolerated. Continue current meds Pt losing weight and sugars are low---- lower ozrmpic dose

## 2022-06-22 ENCOUNTER — Ambulatory Visit: Payer: No Typology Code available for payment source | Admitting: Family Medicine

## 2022-07-12 ENCOUNTER — Telehealth: Payer: Self-pay | Admitting: Family Medicine

## 2022-07-12 NOTE — Telephone Encounter (Signed)
Spoke w/ Pt- informed of results.

## 2022-07-12 NOTE — Telephone Encounter (Signed)
Pt states she usually gets a call about her labs and would like a nurse to f/u with her. Pt is aware results are available in mychart.

## 2022-07-16 ENCOUNTER — Telehealth: Payer: Self-pay | Admitting: Family Medicine

## 2022-07-16 NOTE — Telephone Encounter (Signed)
Patient called to follow up on her neurology referral. Advised that they placed a note in the referral that says they are not booking for neuropsych testing until the end of 2024. Patient will need a referral to another office for testing. Please call patient to advise  about new referral.   Also, patient has questions about getting her [facility] and she said the last couple of times she has gotten from here. She said she was working with Lynelle Smoke on this. Patient would like call back regarding this. Her last dose will be Sunday.

## 2022-07-17 ENCOUNTER — Other Ambulatory Visit (HOSPITAL_BASED_OUTPATIENT_CLINIC_OR_DEPARTMENT_OTHER): Payer: Self-pay

## 2022-07-17 NOTE — Telephone Encounter (Signed)
Do I need to place a new referral? 

## 2022-07-17 NOTE — Telephone Encounter (Signed)
Caller Name [place] Phone Number [phone] Patient Name Beth Shepard Patient DOB [birth year] Call Type Message Only Information Provided Reason for Call Request for General Office Information Initial Comment Caller states if there's another neurologist she can see. Additional Comment Office hours provided. Disp. Time Disposition Final User 07/16/2022 5:46:10 PM General Information Provided Yes Valentino Nose Call Closed By: Valentino Nose Transaction Date/Time: 07/16/2022 5:40:25 PM (ET)

## 2022-07-17 NOTE — Telephone Encounter (Signed)
[employer] [employer] again. I was able to reach a representative. They have entered an override for patient to get Ozempic at [place] again in January 2024.

## 2022-07-17 NOTE — Telephone Encounter (Signed)
[facility] to get [facility] for [facility] to get [facility].  Was transferred twice and waiting on phone for 12 minutes and then call was dropped during 3rd transfer.  I am unable to call back at this time due to other scheduled patient appointments this afternoon. Will try again in the next 24 to 48 hours.

## 2022-07-17 NOTE — Telephone Encounter (Signed)
[facility] and they ran [facility] thru with $0 copay.  Patient was notified.

## 2022-07-24 ENCOUNTER — Other Ambulatory Visit (HOSPITAL_BASED_OUTPATIENT_CLINIC_OR_DEPARTMENT_OTHER): Payer: Self-pay

## 2022-07-25 ENCOUNTER — Other Ambulatory Visit: Payer: Self-pay | Admitting: Family Medicine

## 2022-07-25 ENCOUNTER — Other Ambulatory Visit (HOSPITAL_BASED_OUTPATIENT_CLINIC_OR_DEPARTMENT_OTHER): Payer: Self-pay

## 2022-07-25 DIAGNOSIS — E785 Hyperlipidemia, unspecified: Secondary | ICD-10-CM

## 2022-07-25 DIAGNOSIS — I10 Essential (primary) hypertension: Secondary | ICD-10-CM

## 2022-07-25 DIAGNOSIS — F419 Anxiety disorder, unspecified: Secondary | ICD-10-CM

## 2022-07-26 NOTE — Telephone Encounter (Signed)
Requesting: Xanax Contract: 2018 UDS: 2018 Last OV: 06/15/2022 Next OV: N/A Last Refill: 02/27/2022, #90--0 RF Database:   Please advise

## 2022-08-06 ENCOUNTER — Encounter: Payer: Self-pay | Admitting: Psychology

## 2022-08-12 ENCOUNTER — Other Ambulatory Visit: Payer: Self-pay | Admitting: Family Medicine

## 2022-09-10 ENCOUNTER — Other Ambulatory Visit: Payer: Self-pay

## 2022-09-10 ENCOUNTER — Other Ambulatory Visit: Payer: Self-pay | Admitting: Family Medicine

## 2022-09-10 ENCOUNTER — Other Ambulatory Visit (HOSPITAL_BASED_OUTPATIENT_CLINIC_OR_DEPARTMENT_OTHER): Payer: Self-pay

## 2022-09-10 DIAGNOSIS — E1165 Type 2 diabetes mellitus with hyperglycemia: Secondary | ICD-10-CM

## 2022-09-10 MED ORDER — OZEMPIC (0.25 OR 0.5 MG/DOSE) 2 MG/3ML ~~LOC~~ SOPN
0.5000 mg | PEN_INJECTOR | SUBCUTANEOUS | 0 refills | Status: DC
  Filled 2022-09-10 – 2022-09-12 (×3): qty 3, 28d supply, fill #0

## 2022-09-12 ENCOUNTER — Telehealth: Payer: Self-pay | Admitting: Family Medicine

## 2022-09-12 ENCOUNTER — Other Ambulatory Visit (HOSPITAL_COMMUNITY): Payer: Self-pay

## 2022-09-12 DIAGNOSIS — E1165 Type 2 diabetes mellitus with hyperglycemia: Secondary | ICD-10-CM

## 2022-09-12 MED ORDER — OZEMPIC (0.25 OR 0.5 MG/DOSE) 2 MG/3ML ~~LOC~~ SOPN: 0.5000 mg | PEN_INJECTOR | SUBCUTANEOUS | 0 refills | Status: DC

## 2022-09-12 NOTE — Addendum Note (Signed)
Addended by: Cherre Robins B on: 09/12/2022 05:05 PM   Modules accepted: Orders

## 2022-09-12 NOTE — Telephone Encounter (Signed)
Pt's daughter called to see if there are any samples for ozempic. She said that [facility] [place] [facility] and pharmacy downstairs cannot run it due to her insurance for whatever reason. She said her mom has been without the medication. Please call Meghan at [phone]

## 2022-09-12 NOTE — Telephone Encounter (Signed)
Spoke with patient's daughter.  Provided sample of Ozempic - inject 0.5mg  weekly LOT: [id number] / Exp 12/23/2023  Meghan her daughter also voiced concern that her mother's cognitive function was worsening. She mentioned that Mrs. Deleon's work is concerned that she cannot continue to do her job.  I asked about low blood glucose and Meghan said it was not low and did not feel these changes were related to blood glucose.  Patient was referred for neuro evaluation but appointment is not until end of 2024.   Will forward to PCP for recommendations- may need follow up appt or referral to other neuro group that can see patient sooner.

## 2022-09-13 ENCOUNTER — Other Ambulatory Visit (HOSPITAL_BASED_OUTPATIENT_CLINIC_OR_DEPARTMENT_OTHER): Payer: Self-pay

## 2022-09-13 ENCOUNTER — Other Ambulatory Visit: Payer: Self-pay | Admitting: Pharmacist

## 2022-09-13 DIAGNOSIS — E1165 Type 2 diabetes mellitus with hyperglycemia: Secondary | ICD-10-CM

## 2022-09-13 MED ORDER — OZEMPIC (0.25 OR 0.5 MG/DOSE) 2 MG/3ML ~~LOC~~ SOPN
0.5000 mg | PEN_INJECTOR | SUBCUTANEOUS | 0 refills | Status: DC
  Filled 2022-09-13: qty 3, 28d supply, fill #0

## 2022-09-14 ENCOUNTER — Other Ambulatory Visit: Payer: Self-pay | Admitting: Family Medicine

## 2022-09-14 DIAGNOSIS — R4189 Other symptoms and signs involving cognitive functions and awareness: Secondary | ICD-10-CM

## 2022-09-14 NOTE — Telephone Encounter (Signed)
LM on VM that new referral was placed to see if can get patient worked in sooner for neuro evaluation.

## 2022-09-20 ENCOUNTER — Other Ambulatory Visit: Payer: Self-pay | Admitting: Family Medicine

## 2022-09-20 ENCOUNTER — Other Ambulatory Visit: Payer: Self-pay | Admitting: Family

## 2022-09-20 DIAGNOSIS — I10 Essential (primary) hypertension: Secondary | ICD-10-CM

## 2022-09-20 DIAGNOSIS — E785 Hyperlipidemia, unspecified: Secondary | ICD-10-CM

## 2022-09-20 DIAGNOSIS — E1151 Type 2 diabetes mellitus with diabetic peripheral angiopathy without gangrene: Secondary | ICD-10-CM

## 2022-09-20 DIAGNOSIS — F419 Anxiety disorder, unspecified: Secondary | ICD-10-CM

## 2022-09-24 NOTE — Telephone Encounter (Signed)
Requesting: Xanax Contract: 2018 UDS: 2018 Last OV: 06/15/2022 Next OV: N/A Last Refill: 07/26/22, #90--0 RF Database:   Please advise

## 2022-09-24 NOTE — Telephone Encounter (Signed)
Pt called to find out why her glimepiride was refused for refill. Per her chart, this medication was discontinued on 01/17/22 by Tammy due to discontinuation by provider. Pt would like a call to go over reason when possible.

## 2022-10-01 ENCOUNTER — Telehealth: Payer: Self-pay | Admitting: Pharmacist

## 2022-10-01 NOTE — Telephone Encounter (Signed)
[name] called regarding sooner referral for neurology. Dr Zola Button sent referral 09/14/2022 but looks like [facility] is requesting patient to see PCP for more initial testing and documentation of cognitive changes before they will schedule initial assessment.  Meghan, patient's daughter is also wanting imaging.  Patient will need to be seen in office. Meghan is going to check with Mrs. Friedt and her sister to determine a time that both the patient and one of her daughters can come in for appointment.

## 2022-10-02 ENCOUNTER — Telehealth: Payer: Self-pay | Admitting: Family Medicine

## 2022-10-02 ENCOUNTER — Ambulatory Visit (INDEPENDENT_AMBULATORY_CARE_PROVIDER_SITE_OTHER): Payer: Self-pay | Admitting: Family Medicine

## 2022-10-02 ENCOUNTER — Encounter: Payer: Self-pay | Admitting: Family Medicine

## 2022-10-02 VITALS — BP 136/82 | HR 94 | Temp 98.1°F | Resp 18 | Ht 61.0 in | Wt 151.4 lb

## 2022-10-02 DIAGNOSIS — R413 Other amnesia: Secondary | ICD-10-CM

## 2022-10-02 DIAGNOSIS — E1165 Type 2 diabetes mellitus with hyperglycemia: Secondary | ICD-10-CM

## 2022-10-02 DIAGNOSIS — E1169 Type 2 diabetes mellitus with other specified complication: Secondary | ICD-10-CM

## 2022-10-02 DIAGNOSIS — R4189 Other symptoms and signs involving cognitive functions and awareness: Secondary | ICD-10-CM

## 2022-10-02 DIAGNOSIS — E785 Hyperlipidemia, unspecified: Secondary | ICD-10-CM

## 2022-10-02 DIAGNOSIS — I10 Essential (primary) hypertension: Secondary | ICD-10-CM

## 2022-10-02 NOTE — Assessment & Plan Note (Signed)
Neuro pending  Mri brain  Check labs

## 2022-10-02 NOTE — Assessment & Plan Note (Signed)
Encourage heart healthy diet such as MIND or DASH diet, increase exercise, avoid trans fats, simple carbohydrates and processed foods, consider a krill or fish or flaxseed oil cap daily.  °

## 2022-10-02 NOTE — Telephone Encounter (Signed)
-----   Message from Donato Schultz, [place] sent at 10/02/2022  4:07 PM EDT ----- Pt had app at 4pm --- re "neuro referral "---- one was placed in March ...  Was appointment scheduled ---- is that all they needed?

## 2022-10-02 NOTE — Progress Notes (Signed)
Subjective:   By signing my name below, I, Christoper Allegra, attest that this documentation has been prepared under the direction and in the presence of Donato Schultz, DO. 10/02/2022   Patient ID: Beth Shepard, female    DOB: 1955-10-27, 67 y.o.   MRN: [id number]  Chief Complaint  Patient presents with   Referral   HPI Patient is in today for office visit. She complains of frequently forgetting words. Her mind often goes blank. Her daughter is concern she may have a brain tumor due to family history of brain tumor.  She will not be able to see neurologist she was referred to until November 2024.    Past Medical History:  Diagnosis Date   Anxiety    Bilateral edema of lower extremity    Swelling in legs and ankles   Congestive heart failure (HCC)    COPD (chronic obstructive pulmonary disease) (HCC)    Diabetes mellitus    type 2   Gastric polyps    GERD (gastroesophageal reflux disease)    chronic   Hyperlipidemia    Hypertension    IBS (irritable bowel syndrome)    LBBB (left bundle branch block)    Dr. Josefa Half, [facility]   Pneumonia    hx of   PONV (postoperative nausea and vomiting)    Had N/V after having tubal ligation and D&C   Rheumatoid arthritis (HCC)    Shingles     Past Surgical History:  Procedure Laterality Date   CARDIAC CATHETERIZATION     No PCI   CHOLECYSTECTOMY     COLONOSCOPY  06/06/2005   normal    DILATION AND CURETTAGE OF UTERUS     ESOPHAGOGASTRODUODENOSCOPY  06/06/2005, 11/17/2013   JOINT REPLACEMENT Right 10/24/2015   rt hip replacement   KNEE ARTHROSCOPY Left    TOTAL HIP ARTHROPLASTY Right 10/24/2015   Procedure: RIGHT TOTAL HIP ARTHROPLASTY ANTERIOR APPROACH;  Surgeon: Tarry Kos, MD;  Location: [facility] OR;  Service: Orthopedics;  Laterality: Right;   TUBAL LIGATION     VAGINAL HYSTERECTOMY      Family History  Problem Relation Age of Onset   Lymphoma Mother    Heart disease Mother        chf, cabg x6    Hypertension Mother    GER disease Mother    Hiatal hernia Mother    Gallstones Mother    Brain cancer Sister        brain stage 4   Heart disease Maternal Grandmother 31       MI   Heart disease Maternal Grandfather    Stroke Paternal Grandmother    Hypertension Paternal Grandmother    Stroke Paternal Grandfather    Hypertension Paternal Grandfather    Rheum arthritis Father    Hypertension Daughter    Colon cancer Neg Hx    Esophageal cancer Neg Hx    Pancreatic cancer Neg Hx    Liver disease Neg Hx    Kidney disease Neg Hx     Social History   Socioeconomic History   Marital status: Widowed    Spouse name: Not on file   Number of children: 2   Years of education: Not on file   Highest education level: Not on file  Occupational History   Occupation: Administive     Employer: [employer]  Tobacco Use   Smoking status: Former    Years: 33    Types: Cigarettes  Quit date: 06/25/2002    Years since quitting: 20.2   Smokeless tobacco: Never  Vaping Use   Vaping Use: Never used  Substance and Sexual Activity   Alcohol use: Yes    Comment: rare   Drug use: No   Sexual activity: Not Currently    Partners: Male  Other Topics Concern   Not on file  Social History Narrative   Limited exercise per cardiology   Social Determinants of Health   Financial Resource Strain: Low Risk  (11/07/2021)   Overall Financial Resource Strain (CARDIA)    Difficulty of Paying Living Expenses: Not very hard  Food Insecurity: Not on file  Transportation Needs: No Transportation Needs (01/17/2022)   PRAPARE - Administrator, Civil Service (Medical): No    Lack of Transportation (Non-Medical): No  Physical Activity: Insufficiently Active (01/17/2022)   Exercise Vital Sign    Days of Exercise per Week: 3 days    Minutes of Exercise per Session: 30 min  Stress: Not on file  Social Connections: Not on file  Intimate Partner Violence: Not on file    Outpatient  Medications Prior to Visit  Medication Sig Dispense Refill   albuterol (PROAIR HFA) 108 (90 Base) MCG/ACT inhaler INHALE 2 PUFFS INTO THE LUNGS EVERY 6 HOURS AS NEEDED FOR WHEEZE 18 each 2   ALPRAZolam (XANAX) 0.25 MG tablet TAKE 1 TABLET BY MOUTH THREE TIMES A DAY AS NEEDED 90 tablet 0   aspirin 81 MG tablet Take 81 mg by mouth daily.     blood glucose meter kit and supplies KIT Dispense based on patient and insurance preference. Use up to four times daily as directed. 1 each 0   Calcium Carb-Cholecalciferol (CALCIUM-VITAMIN D) 500-200 MG-UNIT tablet Take 1 tablet by mouth daily.     carvedilol (COREG) 6.25 MG tablet Take 1 tablet (6.25 mg total) by mouth 2 (two) times daily with a meal. 60 tablet 3   Continuous Blood Gluc Receiver (DEXCOM G7 RECEIVER) DEVI USE TO CHECK BLOOD GLUCOSE WITH DEXCOM 7 SENSORS 1 each 0   Continuous Blood Gluc Sensor (DEXCOM G7 SENSOR) MISC USE TO CHECK BLOOD GLUCOSE CONTINUOUSLY 9 each 1   fluticasone (FLONASE) 50 MCG/ACT nasal spray Place 2 sprays into both nostrils daily. 16 g 6   furosemide (LASIX) 20 MG tablet TAKE 1 TABLET BY MOUTH EVERY DAY 90 tablet 1   gabapentin (NEURONTIN) 100 MG capsule TAKE 1 CAPSULE BY MOUTH THREE TIMES A DAY 270 capsule 0   glucose blood test strip One Touch Verio Test Strips; Use to check blood glucose twice a day (Dx: uncontrolled type 2 DM) 100 each 3   lisinopril (ZESTRIL) 20 MG tablet Take 1 tablet (20 mg total) by mouth daily. For blood pressure and kidney protection 90 tablet 1   magic mouthwash SOLN Swish and spit 4 times a day 240 mL 1   OneTouch Delica Lancets 33G MISC Use to check sugar 4 times a day.  Dx code E11.9 300 each 1   pantoprazole (PROTONIX) 40 MG tablet Take 1 tablet (40 mg total) by mouth in the morning. ** CALL OFFICE TO SCHEDULE APPOINTMENT 90 tablet 0   rosuvastatin (CRESTOR) 10 MG tablet Take 1 tablet (10 mg total) by mouth daily. (Ok to take morning or evening) For cholesterol 90 tablet 1   Semaglutide,0.25  or 0.5MG /DOS, (OZEMPIC, 0.25 OR 0.5 MG/DOSE,) 2 MG/3ML SOPN Inject 0.5 mg into the skin once a week. 3 mL 0  spironolactone (ALDACTONE) 25 MG tablet TAKE 1 TABLET BY MOUTH EVERY OTHER DAY 45 tablet 0   No facility-administered medications prior to visit.    Allergies  Allergen Reactions   Prednisone Palpitations and Other (See Comments)    Dose pack causes vomiting; Causes Gerd N/v/d N/v/d    Codeine Nausea Only and Palpitations    REACTION: palpitation  REACTION: palpitation     Ibuprofen Other (See Comments) and Rash    REACTION: questionable. Pt states that she can take this sometimes. GERD    Review of Systems  Constitutional:  Negative for fever and malaise/fatigue.  HENT:  Negative for congestion.   Eyes:  Negative for blurred vision.  Respiratory:  Negative for shortness of breath.   Cardiovascular:  Negative for chest pain, palpitations and leg swelling.  Gastrointestinal:  Negative for abdominal pain, blood in stool and nausea.  Genitourinary:  Negative for dysuria and frequency.  Musculoskeletal:  Negative for falls.  Skin:  Negative for rash.  Neurological:  Negative for dizziness, loss of consciousness and headaches.  Endo/Heme/Allergies:  Negative for environmental allergies.  Psychiatric/Behavioral:  Positive for memory loss. Negative for depression. The patient is not nervous/anxious.        Objective:    Physical Exam Vitals and nursing note reviewed.  Constitutional:      Appearance: Normal appearance. She is well-developed.  HENT:     Head: Normocephalic and atraumatic.     Right Ear: External ear normal.     Left Ear: External ear normal.     Mouth/Throat:     Mouth: Mucous membranes are moist.     Pharynx: Oropharynx is clear.  Eyes:     Extraocular Movements: Extraocular movements intact.     Conjunctiva/sclera: Conjunctivae normal.     Pupils: Pupils are equal, round, and reactive to light.  Neck:     Thyroid: No thyromegaly.      Vascular: No carotid bruit or JVD.  Cardiovascular:     Rate and Rhythm: Normal rate and regular rhythm.     Heart sounds: Normal heart sounds. No murmur heard.    No gallop.  Pulmonary:     Effort: Pulmonary effort is normal. No respiratory distress.     Breath sounds: Normal breath sounds. No wheezing or rales.  Chest:     Chest wall: No tenderness.  Musculoskeletal:     Cervical back: Normal range of motion and neck supple.  Skin:    General: Skin is warm.  Neurological:     General: No focal deficit present.     Mental Status: She is alert and oriented to person, place, and time. Mental status is at baseline.     Comments: Pt c/o losing words and occasional memory loss Neuro can not see her until Nov--- she is on the cancellation list   Psychiatric:        Judgment: Judgment normal.     BP 136/82 (BP Location: Left Arm, Patient Position: Sitting, Cuff Size: Normal)   Pulse 94   Temp 98.1 F (36.7 C) (Oral)   Resp 18   Ht 5\' 1"  (1.549 m)   Wt 151 lb 6.4 oz (68.7 kg)   SpO2 98%   BMI 28.61 kg/m  Wt Readings from Last 3 Encounters:  10/02/22 151 lb 6.4 oz (68.7 kg)  06/15/22 145 lb 3.2 oz (65.9 kg)  05/29/22 146 lb (66.2 kg)       Assessment & Plan:  Cognitive decline -  MR BRAIN WO CONTRAST; Future -     Lipid panel; Future -     CBC with Differential/Platelet; Future -     Comprehensive metabolic panel; Future -     Hemoglobin A1c; Future -     T4, free; Future -     T3, free; Future -     TSH; Future -     Vitamin B12; Future -     VITAMIN D 25 Hydroxy (Vit-D Deficiency, Fractures); Future -     RPR; Future  Uncontrolled type 2 diabetes mellitus with hyperglycemia -     Comprehensive metabolic panel; Future -     Hemoglobin A1c; Future -     Microalbumin / creatinine urine ratio; Future  Hyperlipidemia LDL goal <70  Essential hypertension Assessment & Plan: Well controlled, no changes to meds. Encouraged heart healthy diet such as the DASH diet  and exercise as tolerated.     Hyperlipidemia associated with type 2 diabetes mellitus Assessment & Plan: Encourage heart healthy diet such as MIND or DASH diet, increase exercise, avoid trans fats, simple carbohydrates and processed foods, consider a krill or fish or flaxseed oil cap daily.     Memory loss Assessment & Plan: Neuro pending  Mri brain  Check labs      I, Donato Schultz, DO, personally preformed the services described in this documentation.  All medical record entries made by the scribe were at my direction and in my presence.  I have reviewed the chart and discharge instructions (if applicable) and agree that the record reflects my personal performance and is accurate and complete. 10/02/2022  Donato Schultz, DO   Dennis Bast Oliver,acting as a scribe for Donato Schultz, DO.,have documented all relevant documentation on the behalf of Donato Schultz, DO,as directed by  Donato Schultz, DO while in the presence of Donato Schultz, DO.

## 2022-10-02 NOTE — Telephone Encounter (Signed)
Seems like they were needing an in office visit before seeing her in neuro... I believe that is what is on referral note.   But yes, correct patient was coming in for referral

## 2022-10-02 NOTE — Assessment & Plan Note (Signed)
Well controlled, no changes to meds. Encouraged heart healthy diet such as the DASH diet and exercise as tolerated.  °

## 2022-10-09 ENCOUNTER — Ambulatory Visit (HOSPITAL_COMMUNITY): Payer: Self-pay

## 2022-10-11 ENCOUNTER — Telehealth: Payer: Self-pay | Admitting: Pharmacist

## 2022-10-11 NOTE — Telephone Encounter (Signed)
Patient lost her insurance a few weeks ago when she lost her job.  She has not been able to afford DexCom7 sensors or Ozempic.  Spoke with patient's daughter Lindie Spruce and she anticipates that patient will get [facility] Medicare coverage around may 1st  Provided #2 samples of DexCom 7 and #1 sample of Ozempic 0.25/0.5mg  strength.  Once she gets [facility] we might have to do prior authorization for DexCom.

## 2022-11-07 ENCOUNTER — Other Ambulatory Visit (HOSPITAL_COMMUNITY): Payer: Self-pay

## 2022-11-23 ENCOUNTER — Telehealth: Payer: Self-pay | Admitting: Pharmacist

## 2022-11-23 NOTE — Telephone Encounter (Signed)
Patient has lost insurance due to losing her job in April.  Called today to check to see if she has signed up for Medicare plan yet. Patient states that she has and coverage with [facility] and will start tomorrow 11/24/2022. She has not received card yet.  She or her daughter will call office Monday 11/26/2022 to see if our front office can pull up her information so she can schedule appointment with PCP and to have labs checked.  Patient should also be able to ask her pharmacy to get insurance info through [employer].   She has run out of Dexcom sensors - I am not sure that her new [facility] plan will cover if Continuous Glucose Monitor sensors if she is not using insulin.  She does have 2 weeks of Ozempic left from the samples that we gave her last month.

## 2022-11-30 NOTE — Telephone Encounter (Signed)
Pt states she got a letter stating she does not have an active plan with [employer]. She only remembers setting up part A of medicare so advised pt to call medicare to get set up with plan b, so she can have that until she can work out a Arboriculturist. Number given to pt and stated she will call us with an update as she would like to see pcp soon.

## 2022-12-03 ENCOUNTER — Ambulatory Visit (HOSPITAL_COMMUNITY): Payer: Self-pay

## 2022-12-03 NOTE — Telephone Encounter (Signed)
As was recommended patient will need to speak with her insurance representative or can call [facility] office or Medicare to discuss Medicare plans and benefits.

## 2022-12-03 NOTE — Telephone Encounter (Signed)
Spoke with daughter. They have spoken with her insurance representative. They are awaiting the approval thru [facility] for Part B and part D / supplemental plan.

## 2022-12-17 ENCOUNTER — Other Ambulatory Visit: Payer: Self-pay | Admitting: Family Medicine

## 2022-12-17 DIAGNOSIS — E785 Hyperlipidemia, unspecified: Secondary | ICD-10-CM

## 2022-12-17 DIAGNOSIS — I10 Essential (primary) hypertension: Secondary | ICD-10-CM

## 2022-12-18 ENCOUNTER — Other Ambulatory Visit: Payer: Self-pay | Admitting: Family Medicine

## 2022-12-20 ENCOUNTER — Ambulatory Visit (HOSPITAL_COMMUNITY)
Admission: RE | Admit: 2022-12-20 | Discharge: 2022-12-20 | Disposition: A | Discharge status: Home / Self Care | Payer: Medicare HMO | Source: Ambulatory Visit | Attending: Family Medicine | Admitting: Family Medicine

## 2022-12-20 DIAGNOSIS — R29818 Other symptoms and signs involving the nervous system: Secondary | ICD-10-CM | POA: Diagnosis not present

## 2022-12-20 DIAGNOSIS — R4189 Other symptoms and signs involving cognitive functions and awareness: Secondary | ICD-10-CM | POA: Diagnosis not present

## 2022-12-20 DIAGNOSIS — G319 Degenerative disease of nervous system, unspecified: Secondary | ICD-10-CM | POA: Diagnosis not present

## 2022-12-21 ENCOUNTER — Telehealth: Payer: Self-pay | Admitting: Pharmacist

## 2022-12-21 NOTE — Telephone Encounter (Signed)
Patient now has active coverage with [facility]. She is due to follow up with Dr Zola Button and labs.  She has appt next week with cardiologist Dr Beverely Pace for July 2nd.  Patient was forwarded to Dr Ernst Spell scheduler.

## 2022-12-28 ENCOUNTER — Ambulatory Visit (INDEPENDENT_AMBULATORY_CARE_PROVIDER_SITE_OTHER): Payer: Medicare HMO | Admitting: Family Medicine

## 2022-12-28 ENCOUNTER — Encounter: Payer: Self-pay | Admitting: Family Medicine

## 2022-12-28 VITALS — BP 120/70 | HR 89 | Temp 98.3°F | Resp 18 | Ht 61.0 in | Wt 151.4 lb

## 2022-12-28 DIAGNOSIS — R198 Other specified symptoms and signs involving the digestive system and abdomen: Secondary | ICD-10-CM | POA: Diagnosis not present

## 2022-12-28 DIAGNOSIS — E1165 Type 2 diabetes mellitus with hyperglycemia: Secondary | ICD-10-CM

## 2022-12-28 DIAGNOSIS — I130 Hypertensive heart and chronic kidney disease with heart failure and stage 1 through stage 4 chronic kidney disease, or unspecified chronic kidney disease: Secondary | ICD-10-CM | POA: Diagnosis not present

## 2022-12-28 DIAGNOSIS — Z7985 Long-term (current) use of injectable non-insulin antidiabetic drugs: Secondary | ICD-10-CM | POA: Diagnosis not present

## 2022-12-28 DIAGNOSIS — F419 Anxiety disorder, unspecified: Secondary | ICD-10-CM | POA: Diagnosis not present

## 2022-12-28 DIAGNOSIS — I10 Essential (primary) hypertension: Secondary | ICD-10-CM | POA: Diagnosis not present

## 2022-12-28 DIAGNOSIS — E785 Hyperlipidemia, unspecified: Secondary | ICD-10-CM | POA: Diagnosis not present

## 2022-12-28 DIAGNOSIS — R413 Other amnesia: Secondary | ICD-10-CM | POA: Diagnosis not present

## 2022-12-28 DIAGNOSIS — I959 Hypotension, unspecified: Secondary | ICD-10-CM | POA: Diagnosis not present

## 2022-12-28 LAB — CBC WITH DIFFERENTIAL/PLATELET
Absolute Monocytes: 561 cells/uL (ref 200–950)
Eosinophils Absolute: 143 cells/uL (ref 15–500)
Eosinophils Relative: 2.8 %
MCV: 87.7 fL (ref 80.0–100.0)
Monocytes Relative: 11 %
RDW: 11.8 % (ref 11.0–15.0)

## 2022-12-28 MED ORDER — OZEMPIC (0.25 OR 0.5 MG/DOSE) 2 MG/3ML ~~LOC~~ SOPN: 0.5000 mg | PEN_INJECTOR | SUBCUTANEOUS | 0 refills | Status: DC

## 2022-12-28 MED ORDER — ROSUVASTATIN CALCIUM 10 MG PO TABS: 10.0000 mg | ORAL_TABLET | Freq: Every day | ORAL | 1 refills | Status: DC

## 2022-12-28 MED ORDER — ALPRAZOLAM 0.25 MG PO TABS: 0.2500 mg | ORAL_TABLET | Freq: Three times a day (TID) | ORAL | 0 refills | Status: DC | PRN

## 2022-12-28 MED ORDER — DEXCOM G7 SENSOR MISC: 1 refills | Status: DC

## 2022-12-28 NOTE — Assessment & Plan Note (Signed)
Encourage heart healthy diet such as MIND or DASH diet, increase exercise, avoid trans fats, simple carbohydrates and processed foods, consider a krill or fish or flaxseed oil cap daily.  °

## 2022-12-28 NOTE — Assessment & Plan Note (Signed)
Neuro pending  Mri reviewed with pt and her daughter

## 2022-12-28 NOTE — Assessment & Plan Note (Signed)
Well controlled, no changes to meds. Encouraged heart healthy diet such as the DASH diet and exercise as tolerated.  °

## 2022-12-28 NOTE — Assessment & Plan Note (Signed)
hgba1c to be checked, minimize simple carbs. Increase exercise as tolerated. Continue current meds  

## 2022-12-28 NOTE — Patient Instructions (Signed)

## 2022-12-28 NOTE — Progress Notes (Signed)
Established Patient Office Visit  Subjective   Patient ID: Beth Shepard, female    DOB: 03-28-1956  Age: 67 y.o. MRN: [id number]  Chief Complaint  Patient presents with   Diabetes   Hyperlipidemia   Hypertension   Follow-up    HPI Discussed the use of AI scribe software for clinical note transcription with the patient, who gave verbal consent to proceed.  History of Present Illness   The patient, with a history of diabetes and arthritis, presents with multiple complaints. She reports ongoing stomach problems, describing pain similar to menstrual cramps and alternating periods of diarrhea and normal bowel movements. The location of the pain is upper abdominal. The patient also reports issues with her hands, which she believes to be arthritis. She was previously evaluated by a rheumatologist who diagnosed osteoarthritis.  The patient also expresses concern about a previous stroke, which was revealed in a recent MRI. She reports experiencing difficulty with communication, often knowing what she wants to say but struggling to express it. The patient's daughter, who works in an assisted living facility, notes similarities between the patient's symptoms and those of stroke patients she has encountered in her work.  The patient also mentions issues with her diabetes medication, Ozempic, and a Dexcom device due to lack of insurance. She reports having to reduce the dosage of Ozempic to make it last longer due to lack of insurance. She is now out of Ozempic and the Dexcom device. However, she now has insurance and is able to refill these medications/devices.  The patient also reports numbness and a sensation of pins sticking in her fingers, particularly in the right hand. She believes this to be related to her arthritis. Additionally, the patient has issues with her toenails, which are long, curved, and causing discomfort when walking.      Patient Active Problem List   Diagnosis Date Noted    Rheumatoid arthritis involving multiple joints (HCC) 12/22/2021   Type 2 diabetes mellitus with diabetic peripheral angiopathy without gangrene, without long-term current use of insulin (HCC) 12/22/2021   Hyperlipidemia associated with type 2 diabetes mellitus (HCC) 09/18/2021   Memory loss 09/08/2021   Thrush 09/08/2021   IBS (irritable bowel syndrome) 05/11/2020   Uncontrolled type 2 diabetes mellitus with hyperglycemia (HCC) 02/01/2020   Nausea without vomiting 01/27/2019   Preventative health care 12/18/2017   Infected sebaceous cyst of skin 07/19/2017   DCM (dilated cardiomyopathy) (HCC) 07/10/2017   Fibromyalgia 04/05/2017   Chronic pain syndrome 04/05/2017   Pain in both lower extremities 02/06/2017   Chronic venous insufficiency 02/06/2017   Cellulitis of lower extremity 08/28/2016   Pain in right ankle and joints of right foot 08/09/2016   Peripheral edema 07/10/2016   Trochanteric bursitis, right hip 05/03/2016   Chronic kidney disease (CKD) stage G3a/A1, moderately decreased glomerular filtration rate (GFR) between 45-59 mL/min/1.73 square meter and albuminuria creatinine ratio less than 30 mg/g (HCC) 04/07/2016   Primary osteoarthritis of right hip 10/24/2015   Hip joint replacement status 10/24/2015   Chest pain 08/25/2015   Right hip pain 08/25/2015   Chest pain, precordial 08/10/2015   Precordial pain 08/10/2015   Cardiomyopathy (HCC) 01/18/2015   Block, bundle branch, left 01/18/2015   Left bundle-branch block 01/18/2015   Diabetes (HCC) 01/18/2015   Nausea alone 12/17/2013   Diarrhea 11/19/2013   Bloating 11/19/2013   Obesity (BMI 30-39.9) 10/21/2013   Multiple joint pain 11/13/2012   MYALGIA 08/29/2010   ABDOMINAL PAIN OTHER SPECIFIED SITE 08/22/2009  OSTEOARTHROS UNSPEC WHETHER GEN/LOC UNSPEC SITE 03/17/2009   Cellulitis and abscess of face 12/17/2008   Disturbance of skin sensation 12/17/2008   Bronchitis 05/07/2008   Anxiety 12/04/2007   SINUSITIS,  ACUTE NOS 12/19/2006   Diabetes mellitus, type II (HCC) 10/08/2006   Hyperlipidemia LDL goal <70 10/08/2006   Essential hypertension 10/08/2006   COPD (chronic obstructive pulmonary disease) (HCC) 10/08/2006   GERD 10/08/2006   Other postprocedural status(V45.89) 10/08/2006   Past Medical History:  Diagnosis Date   Anxiety    Bilateral edema of lower extremity    Swelling in legs and ankles   Congestive heart failure (HCC)    COPD (chronic obstructive pulmonary disease) (HCC)    Diabetes mellitus    type 2   Gastric polyps    GERD (gastroesophageal reflux disease)    chronic   Hyperlipidemia    Hypertension    IBS (irritable bowel syndrome)    LBBB (left bundle branch block)    Dr. Josefa Half, [facility]   Pneumonia    hx of   PONV (postoperative nausea and vomiting)    Had N/V after having tubal ligation and D&C   Rheumatoid arthritis (HCC)    Shingles    Past Surgical History:  Procedure Laterality Date   CARDIAC CATHETERIZATION     No PCI   CHOLECYSTECTOMY     COLONOSCOPY  06/06/2005   normal    DILATION AND CURETTAGE OF UTERUS     ESOPHAGOGASTRODUODENOSCOPY  06/06/2005, 11/17/2013   JOINT REPLACEMENT Right 10/24/2015   rt hip replacement   KNEE ARTHROSCOPY Left    TOTAL HIP ARTHROPLASTY Right 10/24/2015   Procedure: RIGHT TOTAL HIP ARTHROPLASTY ANTERIOR APPROACH;  Surgeon: Tarry Kos, MD;  Location: [facility] OR;  Service: Orthopedics;  Laterality: Right;   TUBAL LIGATION     VAGINAL HYSTERECTOMY     Social History   Tobacco Use   Smoking status: Former    Years: 33    Types: Cigarettes    Quit date: 06/25/2002    Years since quitting: 20.5   Smokeless tobacco: Never  Vaping Use   Vaping Use: Never used  Substance Use Topics   Alcohol use: Yes    Comment: rare   Drug use: No   Social History   Socioeconomic History   Marital status: Widowed    Spouse name: Not on file   Number of children: 2   Years of education: Not on file   Highest  education level: Not on file  Occupational History   Occupation: Administive     Employer: [employer]  Tobacco Use   Smoking status: Former    Years: 33    Types: Cigarettes    Quit date: 06/25/2002    Years since quitting: 20.5   Smokeless tobacco: Never  Vaping Use   Vaping Use: Never used  Substance and Sexual Activity   Alcohol use: Yes    Comment: rare   Drug use: No   Sexual activity: Not Currently    Partners: Male  Other Topics Concern   Not on file  Social History Narrative   Limited exercise per cardiology   Social Determinants of Health   Financial Resource Strain: Low Risk  (11/07/2021)   Overall Financial Resource Strain (CARDIA)    Difficulty of Paying Living Expenses: Not very hard  Food Insecurity: Not on file  Transportation Needs: No Transportation Needs (01/17/2022)   PRAPARE - Administrator, Civil Service (Medical): No  Lack of Transportation (Non-Medical): No  Physical Activity: Insufficiently Active (01/17/2022)   Exercise Vital Sign    Days of Exercise per Week: 3 days    Minutes of Exercise per Session: 30 min  Stress: Not on file  Social Connections: Not on file  Intimate Partner Violence: Not on file   Family Status  Relation Name Status   Mother  Deceased at age 52   Sister  Alive   MGM  Deceased at age 74       MI   MGF  Deceased   PGM  Deceased   PGF  Deceased   Father  Deceased at age 15       septic shock   Daughter  Alive   Daughter  Alive   Neg Hx  (Not Specified)   Family History  Problem Relation Age of Onset   Lymphoma Mother    Heart disease Mother        chf, cabg x6   Hypertension Mother    GER disease Mother    Hiatal hernia Mother    Gallstones Mother    Brain cancer Sister        brain stage 4   Heart disease Maternal Grandmother 68       MI   Heart disease Maternal Grandfather    Stroke Paternal Grandmother    Hypertension Paternal Grandmother    Stroke Paternal Grandfather     Hypertension Paternal Grandfather    Rheum arthritis Father    Hypertension Daughter    Colon cancer Neg Hx    Esophageal cancer Neg Hx    Pancreatic cancer Neg Hx    Liver disease Neg Hx    Kidney disease Neg Hx       Review of Systems  Constitutional:  Negative for fever and malaise/fatigue.  HENT:  Negative for congestion.   Eyes:  Negative for blurred vision.  Respiratory:  Negative for shortness of breath.   Cardiovascular:  Negative for chest pain, palpitations and leg swelling.  Gastrointestinal:  Negative for abdominal pain, blood in stool and nausea.  Genitourinary:  Negative for dysuria and frequency.  Musculoskeletal:  Negative for falls.  Skin:  Negative for rash.  Neurological:  Negative for dizziness, loss of consciousness and headaches.  Endo/Heme/Allergies:  Negative for environmental allergies.  Psychiatric/Behavioral:  Negative for depression. The patient is not nervous/anxious.       Objective:     BP 120/70 (BP Location: Left Arm, Patient Position: Sitting, Cuff Size: Large)   Pulse 89   Temp 98.3 F (36.8 C) (Oral)   Resp 18   Ht 5\' 1"  (1.549 m)   Wt 151 lb 6.4 oz (68.7 kg)   SpO2 96%   BMI 28.61 kg/m  BP Readings from Last 3 Encounters:  12/28/22 120/70  10/02/22 136/82  06/15/22 100/68   Wt Readings from Last 3 Encounters:  12/28/22 151 lb 6.4 oz (68.7 kg)  10/02/22 151 lb 6.4 oz (68.7 kg)  06/15/22 145 lb 3.2 oz (65.9 kg)   SpO2 Readings from Last 3 Encounters:  12/28/22 96%  10/02/22 98%  06/15/22 96%      Physical Exam Vitals and nursing note reviewed.  Constitutional:      General: She is not in acute distress.    Appearance: Normal appearance. She is well-developed.  HENT:     Head: Normocephalic and atraumatic.  Eyes:     General: No scleral icterus.       Right eye:  No discharge.        Left eye: No discharge.  Cardiovascular:     Rate and Rhythm: Normal rate and regular rhythm.     Heart sounds: No murmur  heard. Pulmonary:     Effort: Pulmonary effort is normal. No respiratory distress.     Breath sounds: Normal breath sounds.  Musculoskeletal:        General: Normal range of motion.     Cervical back: Normal range of motion and neck supple.     Right lower leg: No edema.     Left lower leg: No edema.  Skin:    General: Skin is warm and dry.  Neurological:     Mental Status: She is alert and oriented to person, place, and time.  Psychiatric:        Mood and Affect: Mood normal.        Behavior: Behavior normal.        Thought Content: Thought content normal. Thought content is not paranoid or delusional. Thought content does not include homicidal or suicidal ideation. Thought content does not include homicidal or suicidal plan.        Cognition and Memory: Memory is impaired.        Judgment: Judgment normal.      No results found for any visits on 12/28/22.  Last CBC Lab Results  Component Value Date   WBC 7.2 06/15/2022   HGB 12.4 06/15/2022   HCT 37.5 06/15/2022   MCV 86.4 06/15/2022   MCH 28.8 05/29/2022   RDW 13.2 06/15/2022   PLT 157.0 06/15/2022   Last metabolic panel Lab Results  Component Value Date   GLUCOSE 81 06/15/2022   NA 142 06/15/2022   K 4.2 06/15/2022   CL 105 06/15/2022   CO2 29 06/15/2022   BUN 16 06/15/2022   CREATININE 1.04 06/15/2022   GFR 55.07 (L) 06/15/2022   CALCIUM 9.9 06/15/2022   PROT 7.1 06/15/2022   ALBUMIN 4.2 06/15/2022   LABGLOB 2.6 12/22/2021   AGRATIO 1.7 12/22/2021   BILITOT 0.5 06/15/2022   ALKPHOS 60 06/15/2022   AST 17 06/15/2022   ALT 10 06/15/2022   ANIONGAP 8 05/29/2022   Last lipids Lab Results  Component Value Date   CHOL 162 06/15/2022   HDL 56.50 06/15/2022   LDLCALC 95 06/15/2022   LDLDIRECT 148.0 09/11/2021   TRIG 52.0 06/15/2022   CHOLHDL 3 06/15/2022   Last hemoglobin A1c Lab Results  Component Value Date   HGBA1C 5.9 06/15/2022   Last thyroid functions Lab Results  Component Value Date    TSH 2.27 07/06/2021   Last vitamin D Lab Results  Component Value Date   VD25OH 16 (L) 08/29/2010   Last vitamin B12 and Folate Lab Results  Component Value Date   VITAMINB12 281 01/29/2020      The 10-year ASCVD risk score (Arnett DK, et al., 2019) is: 27.3%    Assessment & Plan:   Problem List Items Addressed This Visit       Unprioritized   Anxiety   Relevant Medications   ALPRAZolam (XANAX) 0.25 MG tablet   Hyperlipidemia LDL goal <70   Relevant Medications   rosuvastatin (CRESTOR) 10 MG tablet   ALPRAZolam (XANAX) 0.25 MG tablet   Other Relevant Orders   Lipid panel   CBC with Differential/Platelet   Comprehensive metabolic panel   Uncontrolled type 2 diabetes mellitus with hyperglycemia (HCC) - Primary   Relevant Medications   Continuous Glucose Sensor (  DEXCOM G7 SENSOR) MISC   Semaglutide,0.25 or 0.5MG /DOS, (OZEMPIC, 0.25 OR 0.5 MG/DOSE,) 2 MG/3ML SOPN   rosuvastatin (CRESTOR) 10 MG tablet   Other Relevant Orders   Comprehensive metabolic panel   Hemoglobin A1c   Microalbumin / creatinine urine ratio   Ambulatory referral to Podiatry   Memory loss   Essential hypertension   Relevant Medications   rosuvastatin (CRESTOR) 10 MG tablet   ALPRAZolam (XANAX) 0.25 MG tablet   Other Relevant Orders   CBC with Differential/Platelet   Comprehensive metabolic panel   Other Visit Diagnoses     Hypotension, unspecified hypotension type       Relevant Medications   rosuvastatin (CRESTOR) 10 MG tablet   Alternating constipation and diarrhea       Relevant Orders   Ambulatory referral to Gastroenterology   Hyperlipidemia, unspecified hyperlipidemia type       Relevant Medications   rosuvastatin (CRESTOR) 10 MG tablet   ALPRAZolam (XANAX) 0.25 MG tablet      Assessment and Plan    Abdominal Pain and Diarrhea: Chronic, intermittent upper abdominal pain associated with diarrhea. No recent changes in symptoms. -Refer to Gastroenterology for further  evaluation.  Osteoarthritis: Chronic hand pain, worse on the right, with numbness and pins and needles sensation. -Continue current management.  Hyperlipidemia: On Rosuvastatin for cholesterol management. -Continue Rosuvastatin.  Memory Concerns: Prior discussion of memory issues. MRI shows old stroke. -Neurology appointment scheduled for November. Recommended to call and get on cancellation list for earlier appointment.  Podiatric Concerns: Long, curved toenails causing discomfort. -Refer to Podiatry for nail care.  Medication Management: Out of Ozempic and Dexcom due to lack of insurance. Now has insurance coverage. -Refill Ozempic and Dexcom prescriptions. -Check blood work in 6 months or sooner if needed.  General Health Maintenance / Followup Plans -Follow up in 6 months or sooner if needed.       No follow-ups on file.    Donato Schultz, DO

## 2022-12-29 LAB — MICROALBUMIN / CREATININE URINE RATIO
Creatinine, Urine: 90 mg/dL (ref 20–275)
Microalb Creat Ratio: 22 mg/g creat (ref <30)
Microalb, Ur: 2 mg/dL

## 2022-12-29 LAB — COMPREHENSIVE METABOLIC PANEL
AG Ratio: 1.6 (calc) (ref 1.0–2.5)
ALT: 14 U/L (ref 6–29)
AST: 21 U/L (ref 10–35)
Albumin: 4.5 g/dL (ref 3.6–5.1)
Alkaline phosphatase (APISO): 70 U/L (ref 37–153)
BUN/Creatinine Ratio: 21 (calc) (ref 6–22)
BUN: 24 mg/dL (ref 7–25)
CO2: 27 mmol/L (ref 20–32)
Calcium: 9.9 mg/dL (ref 8.6–10.4)
Chloride: 106 mmol/L (ref 98–110)
Creat: 1.13 mg/dL — ABNORMAL HIGH (ref 0.50–1.05)
Globulin: 2.9 g/dL (calc) (ref 1.9–3.7)
Glucose, Bld: 97 mg/dL (ref 65–99)
Potassium: 4.1 mmol/L (ref 3.5–5.3)
Sodium: 142 mmol/L (ref 135–146)
Total Bilirubin: 0.5 mg/dL (ref 0.2–1.2)
Total Protein: 7.4 g/dL (ref 6.1–8.1)

## 2022-12-29 LAB — LIPID PANEL
Cholesterol: 158 mg/dL (ref <200)
HDL: 67 mg/dL (ref >=50)
LDL Cholesterol (Calc): 75 mg/dL (calc)
Non-HDL Cholesterol (Calc): 91 mg/dL (calc) (ref <130)
Total CHOL/HDL Ratio: 2.4 (calc) (ref <5.0)
Triglycerides: 79 mg/dL (ref <150)

## 2022-12-29 LAB — HEMOGLOBIN A1C
Hgb A1c MFr Bld: 5.9 % of total Hgb — ABNORMAL HIGH (ref <5.7)
Mean Plasma Glucose: 123 mg/dL
eAG (mmol/L): 6.8 mmol/L

## 2022-12-29 LAB — CBC WITH DIFFERENTIAL/PLATELET
Basophils Absolute: 61 cells/uL (ref 0–200)
Basophils Relative: 1.2 %
HCT: 38.4 % (ref 35.0–45.0)
Hemoglobin: 12.6 g/dL (ref 11.7–15.5)
Lymphs Abs: 1209 cells/uL (ref 850–3900)
MCH: 28.8 pg (ref 27.0–33.0)
MCHC: 32.8 g/dL (ref 32.0–36.0)
MPV: 10 fL (ref 7.5–12.5)
Neutro Abs: 3126 cells/uL (ref 1500–7800)
Neutrophils Relative %: 61.3 %
Platelets: 166 10*3/uL (ref 140–400)
RBC: 4.38 10*6/uL (ref 3.80–5.10)
Total Lymphocyte: 23.7 %
WBC: 5.1 10*3/uL (ref 3.8–10.8)

## 2022-12-30 ENCOUNTER — Encounter: Payer: Self-pay | Admitting: Family Medicine

## 2022-12-31 NOTE — Progress Notes (Signed)
Letter mailed out. done

## 2023-01-01 DIAGNOSIS — I420 Dilated cardiomyopathy: Secondary | ICD-10-CM | POA: Diagnosis not present

## 2023-01-01 DIAGNOSIS — I081 Rheumatic disorders of both mitral and tricuspid valves: Secondary | ICD-10-CM | POA: Diagnosis not present

## 2023-01-02 ENCOUNTER — Telehealth: Payer: Self-pay | Admitting: Pharmacist

## 2023-01-02 ENCOUNTER — Ambulatory Visit (INDEPENDENT_AMBULATORY_CARE_PROVIDER_SITE_OTHER): Payer: Medicare HMO | Admitting: Pharmacist

## 2023-01-02 DIAGNOSIS — Z7985 Long-term (current) use of injectable non-insulin antidiabetic drugs: Secondary | ICD-10-CM

## 2023-01-02 DIAGNOSIS — E1151 Type 2 diabetes mellitus with diabetic peripheral angiopathy without gangrene: Secondary | ICD-10-CM

## 2023-01-02 DIAGNOSIS — E785 Hyperlipidemia, unspecified: Secondary | ICD-10-CM

## 2023-01-02 NOTE — Progress Notes (Signed)
01/02/2023 Name: Beth Shepard MRN: [id number] DOB: Dec 05, 1955  Chief Complaint  Patient presents with   Diabetes   Medication Management    Follow up    Beth Shepard is a 68 y.o. year old female who presented for a telephone visit.   They were referred to the pharmacist by their PCP for assistance in managing diabetes and complex medication management.    Subjective: Patient previously has insurance coverage thru her employer [employer] however she is no longer working for them and has recently transitions to [employer] plan (coverage started June 1st)  Medication Access/Adherence  Current Pharmacy:  [id number] [place], [place] - [address] [facility] [place] [address] Phone: [phone] Fax: [phone]  [facility] [address], [address] [place] [address] Phone: [phone] Fax: [phone]   Patient reports affordability concerns with their medications: Yes  - cost of Dex Com 7 sensors Patient reports access/transportation concerns to their pharmacy: No  Patient reports adherence concerns with their medications:  Yes  unable to use DexCom to monitor blood glucose due to cost.    Diabetes:  Current medications: Ozempic 0.25mg  weekly - per patient after her next dose she is scheduled to increase to 0.5mg  weekly  Medications tried in the past: metformin  Current glucose readings: not currently checking blood glucose due to cost of DexCom sensors are not covered by [facility] unless patient is taking insulin or has documented hypoglycemia requiring assistance even after adjusting dose of blood glucose lowering medications.  She was told by [facility] that cost of DexCom 7 sensors would be $283 per month.  She has a glucometer at home that uses finger sticks but has not used recently.  She voices that she hates to restart having to prick her fingers.    Patient reports  hypoglycemic s/sx - only dizziness which is likely due to vertigo. No recent shakiness or sweating. Patient denies hyperglycemic symptoms including no polyuria, polydipsia, polyphagia, nocturia, neuropathy, blurred vision.   Hyperlipidemia/ASCVD Risk Reduction  Current lipid lowering medications: rosuvastatin  Antiplatelet regimen: aspirin 81mg  daily    Medication Management:  Current adherence strategy: daughters are assisting patient to medications / weekly Ozempic dose  Patient reports Good adherence to medications. Improved since her now has Part D coverage.    Objective:  Lab Results  Component Value Date   HGBA1C 5.9 (H) 12/28/2022    Lab Results  Component Value Date   CREATININE 1.13 (H) 12/28/2022   BUN 24 12/28/2022   NA 142 12/28/2022   K 4.1 12/28/2022   CL 106 12/28/2022   CO2 27 12/28/2022    Lab Results  Component Value Date   CHOL 158 12/28/2022   HDL 67 12/28/2022   LDLCALC 75 12/28/2022   LDLDIRECT 148.0 09/11/2021   TRIG 79 12/28/2022   CHOLHDL 2.4 12/28/2022    Medications Reviewed Today     Reviewed by Donato Schultz, DO (Physician) on 12/28/22 at 1654  Med List Status: <None>   Medication Order Taking? Sig Documenting Provider Last Dose Status Informant  albuterol (PROAIR HFA) 108 (90 Base) MCG/ACT inhaler [id number] Yes INHALE 2 PUFFS INTO THE LUNGS EVERY 6 HOURS AS NEEDED FOR WHEEZE Copland, Gwenlyn Found, MD Taking Active   ALPRAZolam (XANAX) 0.25 MG tablet [id number] Yes Take 1 tablet (0.25 mg total) by mouth 3 (three) times daily as needed. Donato Schultz, DO  Active  aspirin 81 MG tablet [id number] Yes Take 81 mg by mouth daily. [provider] Taking Active Self  blood glucose meter kit and supplies KIT [id number] Yes Dispense based on patient and insurance preference. Use up to four times daily as directed. Seabron Spates R, DO Taking Active   Calcium Carb-Cholecalciferol (CALCIUM-VITAMIN D) 500-200 MG-UNIT tablet  [id number] Yes Take 1 tablet by mouth daily. [provider] Taking Active   carvedilol (COREG) 6.25 MG tablet [id number] Yes Take 1 tablet (6.25 mg total) by mouth 2 (two) times daily with a meal. Zola Button, Grayling Congress, DO Taking Active   Continuous Blood Gluc Receiver (DEXCOM G7 RECEIVER) Hardie Pulley [id number] Yes USE TO CHECK BLOOD GLUCOSE WITH DEXCOM 7 SENSORS Seabron Spates R, DO Taking Active   Continuous Glucose Sensor (DEXCOM G7 SENSOR) MISC [id number] Yes As directed Zola Button, Grayling Congress, DO  Active   fluticasone (FLONASE) 50 MCG/ACT nasal spray [id number] Yes Place 2 sprays into both nostrils daily. Donato Schultz, DO Taking Active   furosemide (LASIX) 20 MG tablet [id number] Yes Take 1 tablet (20 mg total) by mouth daily. Zola Button, Myrene Buddy R, DO Taking Active   gabapentin (NEURONTIN) 100 MG capsule [id number] Yes TAKE 1 CAPSULE BY MOUTH THREE TIMES A DAY Donato Schultz, DO Taking Active   glucose blood test strip [id number] Yes One Touch Verio Test Strips; Use to check blood glucose twice a day (Dx: uncontrolled type 2 DM) Zola Button, Grayling Congress, DO Taking Active   lisinopril (ZESTRIL) 20 MG tablet [id number] Yes Take 1 tablet (20 mg total) by mouth daily. For blood pressure and kidney protection Donato Schultz, DO Taking Active   magic mouthwash SOLN [id number] Yes Swish and spit 4 times a day Zola Button, Grayling Congress, DO Taking Active   OneTouch Delica Lancets 33G MISC [id number] Yes Use to check sugar 4 times a day.  Dx code E11.9 Zola Button, Grayling Congress, DO Taking Active   pantoprazole (PROTONIX) 40 MG tablet [id number] Yes Take 1 tablet (40 mg total) by mouth in the morning. ** CALL OFFICE TO SCHEDULE APPOINTMENT Sammuel Cooper, PA-C Taking Active            Med Note Clydie Braun, Toron Bowring B   Fri Dec 01, 2021  2:56 PM) Unable to get refills - due to see gastroenterologist  rosuvastatin (CRESTOR) 10 MG tablet [id number] Yes Take 1 tablet (10 mg total) by mouth daily. (Ok to take  morning or evening) For cholesterol Zola Button, Myrene Buddy R, DO  Active   Semaglutide,0.25 or 0.5MG /DOS, (OZEMPIC, 0.25 OR 0.5 MG/DOSE,) 2 MG/3ML SOPN [id number] Yes Inject 0.5 mg into the skin once a week. Donato Schultz, DO  Active   spironolactone (ALDACTONE) 25 MG tablet [id number] Yes Take 1 tablet (25 mg total) by mouth every other day. Donato Schultz, DO Taking Active               Assessment/Plan:   Diabetes: Currently controlled - Reviewed goal A1c, goal fasting, and goal 2 hour post prandial glucose - Recommend to Continue Ozempic weekly. She might be able to avoid Medication coverage gap for 2024. Will discuss with her daughter applying for medication assistance program.  - Recommend to check glucose 2 to 3 times per week and if she feels symptom of hypoglycemia.  - Discussed Continuous Glucose Monitor options - cost of DexCom 7 is prohibitive to continue. She might abe able to get Freestyle 3 for  around $70 per month. There is an over-the-counter version of Dexcom coming but not out yet that will likely be a similar cost as [employer].  (We could office sample for her to try if she is interested). Patient would like me to discuss with her daughter Lindie Spruce but she is out of town until next week. Will plan to touch base with her then.   Hyperlipidemia/ASCVD Risk Reduction: LDL goal < 70 - last LDL not quite at goal.  - Recommend to continue rosuvastatin   Medication Management: - Currently strategy improving due to getting Medicare Part D.  - Suggested use of weekly pill box to organize medications - Reviewed cost of current medications. Most are $0 copay with exception of Ozempic which was $45 / month  Follow Up Plan: will call daughter (at request of patient) next week   Henrene Pastor, PharmD Clinical Pharmacist [facility]

## 2023-01-02 NOTE — Telephone Encounter (Signed)
Attempted patient outreach to follow up medication management. Patient has recently changed form [employer] to [facility] and I wanted to make sure she has been able to get all her medications.  Unable to reach patient - LM on VM at her home and mobile number.  CB# [phone] or [phone]

## 2023-01-07 ENCOUNTER — Telehealth: Payer: Self-pay | Admitting: Family Medicine

## 2023-01-07 DIAGNOSIS — R413 Other amnesia: Secondary | ICD-10-CM

## 2023-01-07 NOTE — Telephone Encounter (Signed)
New referral placed. Last referral placed in March.

## 2023-01-07 NOTE — Telephone Encounter (Signed)
Pt states she thought pcp was working to get her referred to a neurologist as she has no heard from the one she was originally referred to. Please advise pt with referral information as she seemed very confused.

## 2023-01-11 ENCOUNTER — Encounter: Payer: Self-pay | Admitting: Internal Medicine

## 2023-01-18 ENCOUNTER — Other Ambulatory Visit: Payer: Self-pay | Admitting: Family Medicine

## 2023-01-23 ENCOUNTER — Ambulatory Visit: Payer: Medicare HMO | Admitting: Podiatry

## 2023-01-23 DIAGNOSIS — B351 Tinea unguium: Secondary | ICD-10-CM | POA: Diagnosis not present

## 2023-01-23 DIAGNOSIS — L602 Onychogryphosis: Secondary | ICD-10-CM

## 2023-01-23 DIAGNOSIS — M79674 Pain in right toe(s): Secondary | ICD-10-CM

## 2023-01-23 DIAGNOSIS — E1151 Type 2 diabetes mellitus with diabetic peripheral angiopathy without gangrene: Secondary | ICD-10-CM

## 2023-01-23 DIAGNOSIS — M79675 Pain in left toe(s): Secondary | ICD-10-CM | POA: Diagnosis not present

## 2023-01-23 NOTE — Progress Notes (Signed)
       Subjective:  Patient ID: Beth Shepard, female    DOB: [birth year],  MRN: [id number]   Beth Shepard presents to clinic today for:  Chief Complaint  Patient presents with   Nail Problem    Diabetic Foot Care-nail trim    . Patient notes nails are thick, discolored, elongated and painful in shoegear when trying to ambulate.  Patient was self-conscious about coming in today for her nails because she has had so much difficulty for a long period of time trying to trim them on her own and states that they are extremely long and thick.  PCP is Zola Button, Cameron R, DO.  Last seen on 12/28/2022  Allergies  Allergen Reactions   Prednisone Palpitations and Other (See Comments)    Dose pack causes vomiting; Causes Gerd N/v/d N/v/d    Codeine Nausea Only and Palpitations    REACTION: palpitation  REACTION: palpitation     Ibuprofen Other (See Comments) and Rash    REACTION: questionable. Pt states that she can take this sometimes. GERD    Review of Systems: Negative except as noted in the HPI.  Objective:  There were no vitals filed for this visit.  Beth Shepard is a pleasant 67 y.o. female in NAD. AAO x 3.  Vascular Examination: Capillary refill time is 3-5 seconds to toes bilateral. Palpable pedal pulses b/l LE. Digital hair present b/l. No pedal edema b/l. Skin temperature gradient WNL b/l. No varicosities b/l. No cyanosis or clubbing noted b/l.   Dermatological Examination: Pedal skin with normal turgor, texture and tone b/l. No open wounds. No interdigital macerations b/l. Toenails x10 are 5-9 mm thick, discolored, dystrophic with subungual debris. There is pain with compression of the nail plates.  They are severely elongated x10.  The hallux nails are gryphotic.  The left second toenail has curled back twice upon itself into continuous circles  Neurological Examination: Protective sensation intact bilateral LE.   Musculoskeletal Examination: Muscle strength 5/5 to  all LE muscle groups b/l.      Latest Ref Rng & Units 12/28/2022    3:46 PM 06/15/2022   10:57 AM  Hemoglobin A1C  Hemoglobin-A1c <5.7 % of total Hgb 5.9  5.9    Assessment/Plan: 1. Pain due to onychomycosis of toenails of both feet   2. Onychogryphosis   3. Type II diabetes mellitus with peripheral circulatory disorder (HCC)    The mycotic/gryphotic toenails were sharply debrided x10 with sterile nail nippers and a power debriding burr to decrease bulk/thickness and length.    Return in about 3 months (around 04/25/2023) for [facility].   Clerance Lav, DPM, FACFAS [facility]     [address] [place], [place] [address]                Office [phone]  Fax [phone]

## 2023-01-24 ENCOUNTER — Ambulatory Visit: Payer: Self-pay | Admitting: Podiatry

## 2023-01-25 DIAGNOSIS — I10 Essential (primary) hypertension: Secondary | ICD-10-CM | POA: Diagnosis not present

## 2023-01-25 DIAGNOSIS — I447 Left bundle-branch block, unspecified: Secondary | ICD-10-CM | POA: Diagnosis not present

## 2023-01-25 DIAGNOSIS — I420 Dilated cardiomyopathy: Secondary | ICD-10-CM | POA: Diagnosis not present

## 2023-01-28 ENCOUNTER — Other Ambulatory Visit: Payer: Self-pay | Admitting: Family Medicine

## 2023-01-28 DIAGNOSIS — E1165 Type 2 diabetes mellitus with hyperglycemia: Secondary | ICD-10-CM

## 2023-02-06 ENCOUNTER — Telehealth: Payer: Medicare HMO

## 2023-02-06 ENCOUNTER — Telehealth: Payer: Self-pay | Admitting: Pharmacist

## 2023-02-06 NOTE — Telephone Encounter (Signed)
Patient was scheduled for telephone outreach with Clinical Pharmacist Practitioner today. Unable to reach patient or her daughter Lindie Spruce.  LM on VM of patient's number.

## 2023-02-06 NOTE — Telephone Encounter (Signed)
Pt did not realize she had an appt and wanted to r/s.

## 2023-02-08 NOTE — Telephone Encounter (Signed)
Spoke with patient today - appointment made for Thurs 8/22 at 1:30 pm

## 2023-02-14 ENCOUNTER — Other Ambulatory Visit: Payer: Medicare HMO | Admitting: Pharmacist

## 2023-02-14 NOTE — Progress Notes (Signed)
02/14/2023 Name: Beth Shepard MRN: [id number] DOB: 05/16/1956  Chief Complaint  Patient presents with   Medication Management    Follow up    Beth Shepard is a 67 y.o. year old female who presented for a telephone visit.   They were referred to the pharmacist by their PCP for assistance in managing diabetes and complex medication management.    Subjective:   Medication Access/Adherence  Current Pharmacy:  [id number] [place], [place] - [name] [place] [address] Phone: [phone] Fax: [phone]  [facility] [address] [place] [address] Phone: [phone] Fax: [phone]   Patient reports affordability concerns with their medications: Yes  - cost of Dex Com 7 sensors Patient reports access/transportation concerns to their pharmacy: No  Patient reports adherence concerns with their medications:  Yes  unable to use DexCom to monitor blood glucose due to cost.    Diabetes:  Current medications: Ozempic 0.5mg  weekly  Medications tried in the past: metformin  Current glucose readings: not currently checking blood glucose due to cost of DexCom sensors which are not covered by [facility] unless patient is taking insulin or has documented hypoglycemia requiring assistance even after adjusting dose of blood glucose lowering medications.   [facility] that cost of DexCom 7 sensors would be $283 per month.  She has a glucometer at home that uses finger sticks but has not used recently. Patient prefers not to use fingerstick glucometer  Patient denies recent hypoglycemic s/sx - No dizziness shakiness or sweating.  Patient denies hyperglycemic symptoms including no polyuria, polydipsia, polyphagia, nocturia, neuropathy, blurred vision.  Patient did mention numbness and tingling in her hands, especially at night. She has gabapentin 100mg  on her med list  but she does not remember ever taking gabapentin. It looks like only Rx that was ever writing was in 2021.  Hyperlipidemia/ASCVD Risk Reduction  Current lipid lowering medications: rosuvastatin  Antiplatelet regimen: aspirin 81mg  daily - patient restarted after seeing Dr Beverely Pace   Medication Management:  Current adherence strategy: daughters are assisting patient to medications / weekly Ozempic dose  Patient reports Good adherence to medications. Improved since her now has Part D coverage.   Our medication list had carvedilol 6.25mg  twice a day but looks like last RF was for 12.5mg  and last OV with cardiologist Dr Beverely Pace states she is taking carvedilol 12.5mg  twice a day. See below for refill history. Looks like she has filled both strengths in th last year. Asked patient to looks at her Rx bottle fro carvedilol. She had 2 bottles - one was 12.5mg  twice a day and the other was 6.25mg  twice a day. She states she has been using the 12.5mg  dose.  Blood pressure at cardio office was 110/66 and HR was 73 pm 01/25/2023.   Dispenses   Dispensed Days Supply Quantity Provider Pharmacy  CARVEDILOL 12.5 MG TABLET 09/20/2022 90 180 each Donato Schultz, DO [phone] - R...  CARVEDILOL 6.25 MG TABLET 06/15/2022 90 180 each Donato Schultz, [place] [facility] [phone] - R...  CARVEDILOL 12.5 MG TABLET 03/07/2022 90 180 each Donato Schultz, DO [facility] [phone] - R...       Outpatient Orders   Start Date End Date Dispense Refills Pharmacy   carvedilol (COREG) 6.25 MG tablet 06/15/2022 -- 60 tablet 3/3 [id number] [place]   Take 1 tablet (6.25 mg total)  by mouth 2 (two) times daily with a meal.    carvedilol (COREG) 12.5 MG tablet 03/07/2022 06/15/2022 180 tablet 1/1 [id number] [place]   Take 1 tablet (12.5 mg total) by mouth 2 (two) times daily.    carvedilol (COREG) 12.5 MG tablet 11/03/2021 03/07/2022 180 tablet 1/1 [id number] [place]   Take 1  tablet (12.5 mg total) by mouth 2 (two) times daily.      Objective:  Lab Results  Component Value Date   HGBA1C 5.9 (H) 12/28/2022    Lab Results  Component Value Date   CREATININE 1.13 (H) 12/28/2022   BUN 24 12/28/2022   NA 142 12/28/2022   K 4.1 12/28/2022   CL 106 12/28/2022   CO2 27 12/28/2022    Lab Results  Component Value Date   CHOL 158 12/28/2022   HDL 67 12/28/2022   LDLCALC 75 12/28/2022   LDLDIRECT 148.0 09/11/2021   TRIG 79 12/28/2022   CHOLHDL 2.4 12/28/2022    Medications Reviewed Today   Medications were not reviewed in this encounter       Assessment/Plan:   Diabetes: Currently controlled - Reviewed goal A1c, goal fasting, and goal 2 hour post prandial glucose - Recommend to Continue Ozempic 0.5mg  weekly. She might be able to avoid Medication coverage gap for 2024 since [employer] benefits did not start until May or Jue.   - Recommend to check glucose 2 to 3 times per week and if she feels symptom of hypoglycemia.  - Cost of DexCom 7 is prohibitive to continue. She might abe able to get Freestyle 3 for around $75 per month. There is an over-the-counter version of Dexcom coming but not out yet,  that will likely be a similar cost as [employer].  (We could office sample for her to try if she is interested). Discussed with her daughter [name].   - Recommended patient follow up with PCP to discuss numbness / tingling in her hand. She will check with daughters about scheduling appointment.   - Discussed increasing activity to goal of 150 minutes or more per week.   Hyperlipidemia/ASCVD Risk Reduction: LDL goal < 70 - last LDL not quite at goal.  - Recommend to continue rosuvastatin   Medication Management: - Current strategy improving due to getting Medicare Part D coverage. - Suggested use of weekly pill box to organize medications - Reviewed cost of current medications. Most are $0 copay with exception of Ozempic which was $45 / month  Follow  Up Plan: 1 to 2 months.   Henrene Pastor, PharmD Clinical Pharmacist [facility]

## 2023-04-03 ENCOUNTER — Ambulatory Visit: Payer: Medicare HMO | Admitting: Neurology

## 2023-04-03 ENCOUNTER — Encounter: Payer: Self-pay | Admitting: Neurology

## 2023-04-03 VITALS — BP 108/64 | Ht 63.0 in | Wt 155.0 lb

## 2023-04-03 DIAGNOSIS — R4789 Other speech disturbances: Secondary | ICD-10-CM

## 2023-04-03 NOTE — Patient Instructions (Addendum)
Referral to speech therapy  Continue your current medications  Trial of Melatonin for the insomnia  Continue to follow up with your doctors  Return as needed

## 2023-04-03 NOTE — Progress Notes (Signed)
[facility]  PATIENT: Beth Shepard DOB: July 09, 1955  REQUESTING CLINICIAN: Donato Schultz, * HISTORY FROM: Patient/ Daughter  REASON FOR VISIT: Word finding difficulties    HISTORICAL  CHIEF COMPLAINT:  Chief Complaint  Patient presents with   New Patient (Initial Visit)    Rm 13, NP, memory    HISTORY OF PRESENT ILLNESS:  This is 67 year old woman past medical history of hypertension, hyperlipidemia, diabetes mellitus, heart disease who is presenting with complaint of memory loss.  Basically patient describes the memory loss as word finding difficulty.  She said during the conversation, she knows what she wants to say but the words are just not coming out.  At times, this will make her frustrated.  Patient tells me that the symptoms have worsened since being told that her A1c was elevated at 13 about a year ago.  Since then she was put on Ozempic, lost a lot of weight, A1c normalized but still having word finding difficulty. Daughter tells me that patient does have word finding difficulty, she does have a problem with numbers and writing checks.  Sometimes she will ask how to spell simple words or how to do simple math.  She lives alone, independent in all activities of daily living, she does drive but was lost at one point in [place], this was during the time, her diabetes was uncontrolled.  Patient does live in [place]   TBI: No past history of TBI Stroke:   no past history of stroke Seizures:   no past history of seizures Sleep:   no history of sleep apnea.   Mood: patient denies anxiety and depression Family history of Dementia: Denies  Functional status: independent in all ADLs and IADLs Patient lives alone. Cooking: yes  Cleaning: yes  Shopping: yes, no issues Bathing: no issues  Toileting: no issues  Driving: Yes, no recent accident  Bills: Yes  Medications: No issues with taking her medications  Ever left the stove on by accident?:  Denies  Forget how to use items around the house?: Denies  Getting lost going to familiar places?: Got lost in [place] Forgetting loved ones names?: Denies  Word finding difficulty? Yes Sleep: [place], will get 4 to 5 hours per night    OTHER MEDICAL CONDITIONS: Hypertension, Hyperlipidemia, Diabetes, Heart disease and insomnia.    REVIEW OF SYSTEMS: Full 14 system review of systems performed and negative with exception of: As noted in the HPI   ALLERGIES: Allergies  Allergen Reactions   Prednisone Palpitations and Other (See Comments)    Dose pack causes vomiting; Causes Gerd N/v/d N/v/d    Codeine Nausea Only and Palpitations    REACTION: palpitation  REACTION: palpitation     Ibuprofen Other (See Comments) and Rash    REACTION: questionable. Pt states that she can take this sometimes. GERD    HOME MEDICATIONS: Outpatient Medications Prior to Visit  Medication Sig Dispense Refill   albuterol (PROAIR HFA) 108 (90 Base) MCG/ACT inhaler INHALE 2 PUFFS INTO THE LUNGS EVERY 6 HOURS AS NEEDED FOR WHEEZE 18 each 2   ALPRAZolam (XANAX) 0.25 MG tablet Take 1 tablet (0.25 mg total) by mouth 3 (three) times daily as needed. 90 tablet 0   aspirin 81 MG tablet Take 81 mg by mouth daily.     Calcium Carb-Cholecalciferol (CALCIUM-VITAMIN D) 500-200 MG-UNIT tablet Take 1 tablet by mouth daily.     carvedilol (COREG) 12.5 MG tablet Take 12.5 mg by mouth 2 (two) times daily.  fluticasone (FLONASE) 50 MCG/ACT nasal spray Place 2 sprays into both nostrils daily. 16 g 6   furosemide (LASIX) 20 MG tablet Take 1 tablet (20 mg total) by mouth daily. 90 tablet 0   pantoprazole (PROTONIX) 40 MG tablet Take 1 tablet (40 mg total) by mouth in the morning. ** CALL OFFICE TO SCHEDULE APPOINTMENT 90 tablet 0   rosuvastatin (CRESTOR) 10 MG tablet Take 1 tablet (10 mg total) by mouth daily. (Ok to take morning or evening) For cholesterol 90 tablet 1   Semaglutide,0.25 or 0.5MG /DOS, (OZEMPIC, 0.25  OR 0.5 MG/DOSE,) 2 MG/3ML SOPN INJECT 0.5 MG INTO THE SKIN ONE TIME PER WEEK 9 mL 1   spironolactone (ALDACTONE) 25 MG tablet Take 1 tablet (25 mg total) by mouth every other day. 45 tablet 1   blood glucose meter kit and supplies KIT Dispense based on patient and insurance preference. Use up to four times daily as directed. (Patient not taking: Reported on 02/14/2023) 1 each 0   Continuous Blood Gluc Receiver (DEXCOM G7 RECEIVER) DEVI USE TO CHECK BLOOD GLUCOSE WITH DEXCOM 7 SENSORS (Patient not taking: Reported on 01/02/2023) 1 each 0   Continuous Glucose Sensor (DEXCOM G7 SENSOR) MISC As directed (Patient not taking: Reported on 01/02/2023) 9 each 1   glucose blood test strip One Touch Verio Test Strips; Use to check blood glucose twice a day (Dx: uncontrolled type 2 DM) (Patient not taking: Reported on 01/06/2023) 100 each 3   lisinopril (ZESTRIL) 20 MG tablet Take 1 tablet (20 mg total) by mouth daily. For blood pressure and kidney protection (Patient not taking: Reported on 01/02/2023) 90 tablet 1   OneTouch Delica Lancets 33G MISC Use to check sugar 4 times a day.  Dx code E11.9 (Patient not taking: Reported on 01/06/2023) 300 each 1   No facility-administered medications prior to visit.    PAST MEDICAL HISTORY: Past Medical History:  Diagnosis Date   Anxiety    Bilateral edema of lower extremity    Swelling in legs and ankles   Congestive heart failure (HCC)    COPD (chronic obstructive pulmonary disease) (HCC)    Diabetes mellitus    type 2   Gastric polyps    GERD (gastroesophageal reflux disease)    chronic   Hyperlipidemia    Hypertension    IBS (irritable bowel syndrome)    LBBB (left bundle branch block)    Dr. Josefa Half, [facility]   Pneumonia    hx of   PONV (postoperative nausea and vomiting)    Had N/V after having tubal ligation and D&C   Rheumatoid arthritis (HCC)    Shingles     PAST SURGICAL HISTORY: Past Surgical History:  Procedure Laterality  Date   CARDIAC CATHETERIZATION     No PCI   CHOLECYSTECTOMY     COLONOSCOPY  06/06/2005   normal    DILATION AND CURETTAGE OF UTERUS     ESOPHAGOGASTRODUODENOSCOPY  06/06/2005, 11/17/2013   JOINT REPLACEMENT Right 10/24/2015   rt hip replacement   KNEE ARTHROSCOPY Left    TOTAL HIP ARTHROPLASTY Right 10/24/2015   Procedure: RIGHT TOTAL HIP ARTHROPLASTY ANTERIOR APPROACH;  Surgeon: Tarry Kos, MD;  Location: [facility] OR;  Service: Orthopedics;  Laterality: Right;   TUBAL LIGATION     VAGINAL HYSTERECTOMY      FAMILY HISTORY: Family History  Problem Relation Age of Onset   Lymphoma Mother    Heart disease Mother        Dante Gang,  cabg x6   Hypertension Mother    GER disease Mother    Hiatal hernia Mother    Gallstones Mother    Brain cancer Sister        brain stage 4   Heart disease Maternal Grandmother 18       MI   Heart disease Maternal Grandfather    Stroke Paternal Grandmother    Hypertension Paternal Grandmother    Stroke Paternal Grandfather    Hypertension Paternal Grandfather    Rheum arthritis Father    Hypertension Daughter    Colon cancer Neg Hx    Esophageal cancer Neg Hx    Pancreatic cancer Neg Hx    Liver disease Neg Hx    Kidney disease Neg Hx     SOCIAL HISTORY: Social History   Socioeconomic History   Marital status: Widowed    Spouse name: Not on file   Number of children: 2   Years of education: Not on file   Highest education level: Not on file  Occupational History   Occupation: Administive     Employer: [employer]  Tobacco Use   Smoking status: Former    Current packs/day: 0.00    Types: Cigarettes    Start date: 06/25/1969    Quit date: 06/25/2002    Years since quitting: 20.7   Smokeless tobacco: Never  Vaping Use   Vaping status: Never Used  Substance and Sexual Activity   Alcohol use: Yes    Comment: rare   Drug use: No   Sexual activity: Not Currently    Partners: Male  Other Topics Concern   Not on file  Social  History Narrative   Limited exercise per cardiology   Right handed   Caffeine-2 cups daily   Lives alone, still driving, has gotten lost 1 time   Social Determinants of Health   Financial Resource Strain: Low Risk  (11/07/2021)   Overall Financial Resource Strain (CARDIA)    Difficulty of Paying Living Expenses: Not very hard  Food Insecurity: Not on file  Transportation Needs: No Transportation Needs (01/17/2022)   PRAPARE - Administrator, Civil Service (Medical): No    Lack of Transportation (Non-Medical): No  Physical Activity: Insufficiently Active (02/14/2023)   Exercise Vital Sign    Days of Exercise per Week: 3 days    Minutes of Exercise per Session: 10 min  Stress: Not on file  Social Connections: Not on file  Intimate Partner Violence: Not on file    PHYSICAL EXAM  GENERAL EXAM/CONSTITUTIONAL: Vitals:  Vitals:   04/03/23 1509  BP: 108/64  Weight: 155 lb (70.3 kg)  Height: 5\' 3"  (1.6 m)   Body mass index is 27.46 kg/m. Wt Readings from Last 3 Encounters:  04/03/23 155 lb (70.3 kg)  12/28/22 151 lb 6.4 oz (68.7 kg)  10/02/22 151 lb 6.4 oz (68.7 kg)   Patient is in no distress; well developed, nourished and groomed; neck is supple  MUSCULOSKELETAL: Gait, strength, tone, movements noted in Neurologic exam below  NEUROLOGIC: MENTAL STATUS:     04/03/2023    3:14 PM 01/29/2020    2:03 PM  MMSE - Mini Mental State Exam  Orientation to time 5 3  Orientation to Place 5 3  Registration 3   Attention/ Calculation 2 0  Recall 3 3  Language- name 2 objects 2 2  Language- repeat 1 1  Language- follow 3 step command 3 3  Language- read & follow direction  1 1  Write a sentence 1 1  Copy design 0 1  Total score 26    awake, alert, oriented to person, place and time recent and remote memory intact normal attention and concentration language fluent, comprehension intact, naming intact. Able to name, repeat and comprehend fund of knowledge  appropriate  CRANIAL NERVE:  2nd, 3rd, 4th, 6th- visual fields full to confrontation, extraocular muscles intact, no nystagmus 5th - facial sensation symmetric 7th - facial strength symmetric 8th - hearing intact 9th - palate elevates symmetrically, uvula midline 11th - shoulder shrug symmetric 12th - tongue protrusion midline  MOTOR:  normal bulk and tone, full strength in the BUE, BLE  SENSORY:  normal and symmetric to light touch  COORDINATION:  finger-nose-finger, fine finger movements normal  GAIT/STATION:  Antalgic    DIAGNOSTIC DATA (LABS, IMAGING, TESTING) - I reviewed patient records, labs, notes, testing and imaging myself where available.  Lab Results  Component Value Date   WBC 5.1 12/28/2022   HGB 12.6 12/28/2022   HCT 38.4 12/28/2022   MCV 87.7 12/28/2022   PLT 166 12/28/2022      Component Value Date/Time   NA 142 12/28/2022 1546   NA 145 (H) 12/22/2021 1620   K 4.1 12/28/2022 1546   CL 106 12/28/2022 1546   CO2 27 12/28/2022 1546   GLUCOSE 97 12/28/2022 1546   BUN 24 12/28/2022 1546   BUN 16 12/22/2021 1620   CREATININE 1.13 (H) 12/28/2022 1546   CALCIUM 9.9 12/28/2022 1546   PROT 7.4 12/28/2022 1546   PROT 6.9 12/22/2021 1620   ALBUMIN 4.2 06/15/2022 1057   ALBUMIN 4.3 12/22/2021 1620   AST 21 12/28/2022 1546   ALT 14 12/28/2022 1546   ALKPHOS 60 06/15/2022 1057   BILITOT 0.5 12/28/2022 1546   BILITOT 0.4 12/22/2021 1620   GFRNONAA 57 (L) 05/29/2022 1452   GFRAA 54 (L) 10/25/2015 0412   Lab Results  Component Value Date   CHOL 158 12/28/2022   HDL 67 12/28/2022   LDLCALC 75 12/28/2022   LDLDIRECT 148.0 09/11/2021   TRIG 79 12/28/2022   CHOLHDL 2.4 12/28/2022   Lab Results  Component Value Date   HGBA1C 5.9 (H) 12/28/2022   Lab Results  Component Value Date   VITAMINB12 281 01/29/2020   Lab Results  Component Value Date   TSH 2.27 07/06/2021    MRI Brain 12/27/2022 1. No acute intracranial abnormality. 2. Atrophy and  white matter disease is mildly advanced for age. This likely reflects the sequela of chronic microvascular ischemia. 3. Remote lacunar infarcts of the pons.   ASSESSMENT AND PLAN  68 y.o. year old female with hypertension, hyperlipidemia, diabetes mellitus, coronary artery disease who is presenting with memory difficulty, described mainly as word finding difficulty.  Patient is independent in all activities of daily living, normal MMSE.  Informed patient that this is likely normal cognition.  However for her word finding difficulty, I am going to send her to speech therapy.  Advised her to continue following up with her PCP and to try melatonin for her insomnia.  Return as needed.   1. Word finding difficulty      Patient Instructions  Referral to speech therapy  Continue your current medications  Trial of Melatonin for the insomnia  Continue to follow up with your doctors  Return as needed   Orders Placed This Encounter  Procedures   Ambulatory referral to Speech Therapy    No orders of the defined types  were placed in this encounter.   Return if symptoms worsen or fail to improve.    Windell Norfolk, MD 04/03/2023, 5:01 PM  [facility] [address], [address] [place], [place] [address] [phone]

## 2023-04-08 ENCOUNTER — Telehealth: Payer: Self-pay | Admitting: Neurology

## 2023-04-08 NOTE — Telephone Encounter (Signed)
Referral for speech therapy fax to [facility]. Phone: [phone], Fax: [phone]

## 2023-04-10 ENCOUNTER — Telehealth: Payer: Self-pay | Admitting: Family Medicine

## 2023-04-10 NOTE — Telephone Encounter (Signed)
Please advise 

## 2023-04-10 NOTE — Telephone Encounter (Signed)
Pt said she was evaluated by Dr. Teresa Coombs and he said she does not have dementia and as long as she could drive, cook and do things for herself at home, she was okay. Pt has been set up with a speech therapist in November. Patient wants to know since she does not have dementia, does she have to see Dr. Kieth Brightly as well. Please call her to advise if she can cancel that appt.

## 2023-04-11 NOTE — Telephone Encounter (Signed)
LMOM informing Pt okay to cancel appt w/ Dr. Kieth Brightly.

## 2023-04-16 ENCOUNTER — Other Ambulatory Visit: Payer: Self-pay | Admitting: Pharmacist

## 2023-04-16 NOTE — Progress Notes (Signed)
04/16/2023 Name: Beth Shepard MRN: [id number] DOB: 01-May-1956  Chief Complaint  Patient presents with   Diabetes   Medication Management    Beth Shepard is a 67 y.o. year old female who presented for a telephone visit.   They were referred to the pharmacist by their PCP for assistance in managing diabetes and complex medication management.    Subjective:   Medication Access/Adherence  Current Pharmacy:  [id number] - [place], [place] - [name] [address] Phone: [phone] Fax: [phone]  [facility] [address] [place] [address] Phone: [phone] Fax: [phone]   Patient reports affordability concerns with their medications: Yes  - cost of Dex Com 7 sensors Reports no issues with cost of Ozempic - usually $45 per dose.  Patient reports access/transportation concerns to their pharmacy: No  Patient reports adherence concerns with their medications:  No      Diabetes:  Current medications: Ozempic 0.5mg  weekly  Medications tried in the past: metformin  Current glucose readings: not currently checking blood glucose due to cost of D We tried earlier this year to get DexCom sensors approved thru [facility] but was denied because patient is not currently using insulin for diabetes treatment not has she had documented hypoglycemia requiring assistance even after adjusting dose of blood glucose lowering medications.   Patient prefers not to use fingerstick glucometer  Patient denies recent hypoglycemic s/sx - No dizziness shakiness or sweating.  Patient denies hyperglycemic symptoms including no polyuria, polydipsia, polyphagia, nocturia, neuropathy, blurred vision.   Hypertension / HF:  Current medications: carvedilol 12..5mg  twice a day, spironolactone 25mg  every OTHER day, furosemide 20mg  daily.   BP Readings from Last 3 Encounters:  04/03/23 108/64   12/28/22 120/70  10/02/22 136/82    Hyperlipidemia/ASCVD Risk Reduction  Current lipid lowering medications: rosuvastatin  Antiplatelet regimen: aspirin 81mg  daily - patient restarted after seeing Dr Beverely Pace in August 2024   Medication Management:  Current adherence strategy: daughters are assisting patient to medications / weekly Ozempic dose  Patient reports Good adherence to medications. Improved since she now has Part D coverage.   Patient reports she is not sleeping well since she stopped working. Sleeping 3 to 6 hours per night. Dr Teresa Coombs recommended melatonin. She bought melatonin chews - she is taking 2 prior to bedtime. Serving size is 2 gummies = 3mg  total. She is taking 2 hours prior to sleep.  Beth Shepard doesn't feel that it has been very helpful yet but she feels that melatonin has cause a few issues - once it caused her to have stomach pain / cramp and once she had difficulty getting out of her chair. And a few times she felt unsteady on her feet after taking melatonin.   She is suppose to see gastroenterologist in a few weeks.    Objective:  Lab Results  Component Value Date   HGBA1C 5.9 (H) 12/28/2022    Lab Results  Component Value Date   CREATININE 1.13 (H) 12/28/2022   BUN 24 12/28/2022   NA 142 12/28/2022   K 4.1 12/28/2022   CL 106 12/28/2022   CO2 27 12/28/2022    Lab Results  Component Value Date   CHOL 158 12/28/2022   HDL 67 12/28/2022   LDLCALC 75 12/28/2022   LDLDIRECT 148.0 09/11/2021   TRIG 79 12/28/2022   CHOLHDL 2.4 12/28/2022    Medications Reviewed Today  Medications were not reviewed in this encounter       Assessment/Plan:   Diabetes: Currently controlled - Reviewed goal A1c, goal fasting, and goal 2 hour post prandial glucose - Recommend to continue Ozempic 0.5mg  weekly. She should be able to avoid Medication coverage gap for 2024 since [employer] benefits did not start until May or Jue.   - Recommend to check glucose 2 to  3 times per week and if she feels symptom of hypoglycemia.  - Cost of DexCom 7 is prohibitive to continue.Plan to check back in January 2025 to see if formulary / guidelines change.   Hyperlipidemia/ASCVD Risk Reduction: LDL goal < 70 - last LDL not quite at goal.  - Recommend to continue rosuvastatin   Medication Management: - Current strategy improving due to getting Medicare Part D coverage. - Suggested use of weekly pill box to organize medications - Reviewed cost of current medications. Most are $0 copay with exception of Ozempic which was $45 / month - I do not highly suspect that GI symptoms that she experienced were from melatonin but the weakness / difficulty getting up from chair might have been. Recommended she try lower dose of just 1 gummy = 1.5mg  and check in with Dr Teresa Coombs regarding his recommendation.   Follow Up Plan: 3 months   Henrene Pastor, PharmD Clinical Pharmacist [facility]

## 2023-04-22 ENCOUNTER — Other Ambulatory Visit (INDEPENDENT_AMBULATORY_CARE_PROVIDER_SITE_OTHER): Payer: Medicare HMO

## 2023-04-22 ENCOUNTER — Ambulatory Visit: Payer: Medicare HMO | Admitting: Internal Medicine

## 2023-04-22 ENCOUNTER — Encounter: Payer: Self-pay | Admitting: Internal Medicine

## 2023-04-22 VITALS — BP 120/70 | HR 81 | Ht 61.0 in | Wt 154.0 lb

## 2023-04-22 DIAGNOSIS — R1010 Upper abdominal pain, unspecified: Secondary | ICD-10-CM

## 2023-04-22 DIAGNOSIS — R112 Nausea with vomiting, unspecified: Secondary | ICD-10-CM | POA: Diagnosis not present

## 2023-04-22 DIAGNOSIS — K219 Gastro-esophageal reflux disease without esophagitis: Secondary | ICD-10-CM | POA: Diagnosis not present

## 2023-04-22 DIAGNOSIS — R197 Diarrhea, unspecified: Secondary | ICD-10-CM | POA: Diagnosis not present

## 2023-04-22 DIAGNOSIS — Z860100 Personal history of colon polyps, unspecified: Secondary | ICD-10-CM

## 2023-04-22 DIAGNOSIS — R159 Full incontinence of feces: Secondary | ICD-10-CM | POA: Diagnosis not present

## 2023-04-22 MED ORDER — ONDANSETRON HCL 4 MG PO TABS: 4.0000 mg | ORAL_TABLET | Freq: Three times a day (TID) | ORAL | 0 refills | Status: DC | PRN

## 2023-04-22 MED ORDER — NA SULFATE-K SULFATE-MG SULF 17.5-3.13-1.6 GM/177ML PO SOLN: ORAL | 0 refills | Status: DC

## 2023-04-22 NOTE — Progress Notes (Signed)
Chief Complaint: GERD  HPI : 67 year old female with history of COPD, DM, GERD, rheumatoid arthritis, IBS, and LBBB presents with GERD  Interval History: She has had GERD since the 1980s. She is currently having epigastric ab pain and N&V. Epigastric ab pain has been present for years. This pain is occasional but has been quite severe recently. Denies dysphagia. She has diarrhea and constipation. Had diarrhea yesterday. Endorses some rectal bleeding on occasion in the past but denies any currently. She has on average 6 BMs per day. Endorses nocturnal stools at times. She will have fecal incontinence at times. Denies use of fiber supplement. Taking an antidiarrheal makes her throw up. Denies chest burning and regurgitation. Daughter has ab pain and vomiting episodes. She is not currently on any PPI therapy for GERD. She has been on Ozempic since 09/2022, which has helped with weight loss.  She does not think that her Ozempic has contributed to any worsening GI symptoms.  She uses occasional ibuprofen  Wt Readings from Last 3 Encounters:  04/22/23 154 lb (69.9 kg)  04/03/23 155 lb (70.3 kg)  12/28/22 151 lb 6.4 oz (68.7 kg)   Past Medical History:  Diagnosis Date   Anxiety    Bilateral edema of lower extremity    Swelling in legs and ankles   Congestive heart failure (HCC)    COPD (chronic obstructive pulmonary disease) (HCC)    Diabetes mellitus    type 2   Gastric polyps    GERD (gastroesophageal reflux disease)    chronic   Hyperlipidemia    Hypertension    IBS (irritable bowel syndrome)    LBBB (left bundle branch block)    Dr. Josefa Half, [facility]   Pneumonia    hx of   PONV (postoperative nausea and vomiting)    Had N/V after having tubal ligation and D&C   Rheumatoid arthritis (HCC)    Shingles      Past Surgical History:  Procedure Laterality Date   CARDIAC CATHETERIZATION     No PCI   CHOLECYSTECTOMY     COLONOSCOPY  06/06/2005   normal    DILATION  AND CURETTAGE OF UTERUS     ESOPHAGOGASTRODUODENOSCOPY  06/06/2005, 11/17/2013   JOINT REPLACEMENT Right 10/24/2015   rt hip replacement   KNEE ARTHROSCOPY Left    TOTAL HIP ARTHROPLASTY Right 10/24/2015   Procedure: RIGHT TOTAL HIP ARTHROPLASTY ANTERIOR APPROACH;  Surgeon: Tarry Kos, MD;  Location: [facility] OR;  Service: Orthopedics;  Laterality: Right;   TUBAL LIGATION     VAGINAL HYSTERECTOMY     Family History  Problem Relation Age of Onset   Lymphoma Mother    Heart disease Mother        chf, cabg x6   Hypertension Mother    GER disease Mother    Hiatal hernia Mother    Gallstones Mother    Rheum arthritis Father    Dementia Father    Brain cancer Sister        brain stage 4   Heart disease Maternal Grandmother 80       MI   Heart disease Maternal Grandfather    Stroke Paternal Grandmother    Hypertension Paternal Grandmother    Stroke Paternal Grandfather    Hypertension Paternal Grandfather    Hypertension Daughter    Colon cancer Neg Hx    Esophageal cancer Neg Hx    Pancreatic cancer Neg Hx    Liver disease Neg Hx  Kidney disease Neg Hx    Social History   Tobacco Use   Smoking status: Former    Current packs/day: 0.00    Types: Cigarettes    Start date: 06/25/1969    Quit date: 06/25/2002    Years since quitting: 20.8   Smokeless tobacco: Never  Vaping Use   Vaping status: Never Used  Substance Use Topics   Alcohol use: Yes    Comment: rare   Drug use: No   Current Outpatient Medications  Medication Sig Dispense Refill   albuterol (PROAIR HFA) 108 (90 Base) MCG/ACT inhaler INHALE 2 PUFFS INTO THE LUNGS EVERY 6 HOURS AS NEEDED FOR WHEEZE 18 each 2   ALPRAZolam (XANAX) 0.25 MG tablet Take 1 tablet (0.25 mg total) by mouth 3 (three) times daily as needed. 90 tablet 0   aspirin 81 MG tablet Take 81 mg by mouth daily.     blood glucose meter kit and supplies KIT Dispense based on patient and insurance preference. Use up to four times daily as directed. 1 each 0    Calcium Carb-Cholecalciferol (CALCIUM-VITAMIN D) 500-200 MG-UNIT tablet Take 1 tablet by mouth daily.     carvedilol (COREG) 12.5 MG tablet Take 12.5 mg by mouth 2 (two) times daily.     fluticasone (FLONASE) 50 MCG/ACT nasal spray Place 2 sprays into both nostrils daily. 16 g 6   furosemide (LASIX) 20 MG tablet Take 1 tablet (20 mg total) by mouth daily. 90 tablet 0   rosuvastatin (CRESTOR) 10 MG tablet Take 1 tablet (10 mg total) by mouth daily. (Ok to take morning or evening) For cholesterol 90 tablet 1   Semaglutide,0.25 or 0.5MG /DOS, (OZEMPIC, 0.25 OR 0.5 MG/DOSE,) 2 MG/3ML SOPN INJECT 0.5 MG INTO THE SKIN ONE TIME PER WEEK 9 mL 1   spironolactone (ALDACTONE) 25 MG tablet Take 1 tablet (25 mg total) by mouth every other day. 45 tablet 1   pantoprazole (PROTONIX) 40 MG tablet Take 1 tablet (40 mg total) by mouth in the morning. ** CALL OFFICE TO SCHEDULE APPOINTMENT (Patient not taking: Reported on 04/22/2023) 90 tablet 0   No current facility-administered medications for this visit.   Allergies  Allergen Reactions   Prednisone Palpitations and Other (See Comments)    Dose pack causes vomiting; Causes Gerd N/v/d N/v/d    Codeine Nausea Only and Palpitations    REACTION: palpitation  REACTION: palpitation     Ibuprofen Other (See Comments) and Rash    REACTION: questionable. Pt states that she can take this sometimes. GERD     Review of Systems: All systems reviewed and negative except where noted in HPI.   Physical Exam: BP 120/70   Pulse 81   Ht 5\' 1"  (1.549 m)   Wt 154 lb (69.9 kg)   BMI 29.10 kg/m  Constitutional: Pleasant,well-developed, female in no acute distress. HEENT: Normocephalic and atraumatic. Conjunctivae are normal. No scleral icterus. Cardiovascular: Normal rate, regular rhythm.  Pulmonary/chest: Effort normal and breath sounds normal. No wheezing, rales or rhonchi. Abdominal: Soft, nondistended, tender in the epigastric, RUQ, and LUQ areas. Bowel  sounds active throughout. There are no masses palpable. No hepatomegaly. Extremities: No edema Neurological: Alert and oriented to person place and time. Skin: Skin is warm and dry. No rashes noted. Psychiatric: Normal mood and affect. Behavior is normal.  Labs 10/2013: TTG IgA negative.  Labs 12/2022: CBC nml. CMP with mildly elevated Cr of 1.13. HbA1C 5.9%.   GES 11/03/13: IMPRESSION:  Normal gastric emptying study.  EGD 02/18/19:  - The Z- line was irregular. Biopsies were taken with a cold forceps for histology from the proximal/ mid and distal esophagus. Estimated blood loss was minimal. No ring, web, stricture, or luminal abnormality.  - Diffuse mildly erythematous mucosa was found in the gastric body. Biopsies were taken with a cold forceps for histology. Estimated blood loss was minimal.  - Patchy severely erythematous mucosa without active bleeding and with stigmata of bleeding was found in the duodenal bulb and in the first portion of the duodenum. Biopsies were taken with a cold forceps for histology. Estimated blood loss was minimal.  - The cardia and gastric fundus were normal on retroflexion.  - The exam was otherwise without abnormality. Path: 1. Surgical [P], duodenum - BENIGN DUODENAL MUCOSA - NO ACUTE INFLAMMATION, VILLOUS BLUNTING OR INCREASED INTRAEPITHELIAL LYMPHOCYTES 2. Surgical [P], gastric antrum and body - BENIGN GASTRIC MUCOSA WITH FEATURES CONSISTENT WITH PROTON PUMP INHIBITOR EFFECT - NO H. PYLORI, INTESTINAL METAPLASIA OR MALIGNANCY IDENTIFIED 3. Surgical [P], distal esophagus - BENIGN SQUAMOUS MUCOSA - NO INCREASED INTRAEPITHELIAL EOSINOPHILS - PAS stain is NEGATIVE for fungal elements. 4. Surgical [P], mid and proximal esophagus - BENIGN SQUAMOUS MUCOSA - NO INCREASED INTRAEPITHELIAL EOSINOPHILS  Colonoscopy 02/18/19: - Non- bleeding internal hemorrhoids.  - Diverticulosis in the sigmoid colon.  - Twelve 1 to 4 mm polyps in the sigmoid colon, in the  descending colon and at the hepatic flexure, removed with a cold snare. Resected and retrieved.  - Three less than 1 mm polyps in the ascending colon and in the cecum, removed with a cold biopsy forceps. Resected and retrieved.  - The entire examined colon is normal. Biopsied.  - The examination was otherwise normal on direct and retroflexion views. Path: 5. Surgical [P], colon, ascending, descending, hepatic flexure, sigmoid and cecum, polyp (15) - MULTIPLE FRAGMENTS OF TUBULAR ADENOMA(S) - NO HIGH GRADE DYSPLASIA OR MALIGNANCY IDENTIFIED 6. Surgical [P], random sites - BENIGN COLONIC MUCOSA - NO ACTIVE INFLAMMATION OR EVIDENCE OF MICROSCOPIC COLITIS - NO HIGH GRADE DYSPLASIA OR MALIGNANCY IDENTIFIED  ASSESSMENT AND PLAN: Diarrhea Fecal incontinence Epigastric ab pain GERD IBS N&V Occasional rectal bleeding History of colon polyps Patient presents with longstanding acid reflux, epigastric abdominal pain, and irregular bowel habits.  She was originally due for a colonoscopy back in 2021 for polyp surveillance but did not end up getting this colonoscopy scheduled.  She has uncontrolled acid reflux issues but is no longer on any PPI therapy because she is trying to get off of all unnecessary medications.  She also endorses some NSAID use, which could increase her risk for PUD.  Patient is also describing significant diarrhea and fecal incontinence with occasional nocturnal stools.  Will plan for further evaluation with CT A/P as well as EGD/colonoscopy.  - GERD handout - Check creatinine and lipase - Start fiber supplement with Benefiber or Metamucil - Check CT A/P w/contrast - Will prescribe Zofran 4 mg TID PRN - Patient declined PPI for now - EGD/colonoscopy [facility] - Consider pelvic floor PT in the future  Eulah Pont, MD  I spent 62 minutes of time, including in depth chart review, independent review of results as outlined above, communicating results with the patient directly,  face-to-face time with the patient, coordinating care, ordering studies and medications as appropriate, and documentation.

## 2023-04-22 NOTE — Patient Instructions (Addendum)
Your provider has requested that you go to the basement level for lab work before leaving today. Press "B" on the elevator. The lab is located at the first door on the left as you exit the elevator.   You have been scheduled for a CT scan of the abdomen and pelvis at [facility], 1st floor Radiology. You are scheduled on 04/29/23 at 4:30 pm. You should arrive 30 minutes prior to your appointment time for registration.  If you take any of the following medications: METFORMIN, GLUCOPHAGE, GLUCOVANCE, AVANDAMET, RIOMET, FORTAMET, ACTOPLUS MET, JANUMET, GLUMETZA or METAGLIP, you MAY be asked to HOLD this medication 48 hours AFTER the exam.   The purpose of you drinking the oral contrast is to aid in the visualization of your intestinal tract. The contrast solution may cause some diarrhea. Depending on your individual set of symptoms, you may also receive an intravenous injection of x-ray contrast/dye. Plan on being at [facility] for 45 minutes or longer, depending on the type of exam you are having performed.   If you have any questions regarding your exam or if you need to reschedule, you may call [facility] at [phone] between the hours of 8:00 am and 5:00 pm, Monday-Friday.    You have been scheduled for an endoscopy and colonoscopy. Please follow the written instructions given to you at your visit today.  Please pick up your prep supplies at the pharmacy within the next 1-3 days.  If you use inhalers (even only as needed), please bring them with you on the day of your procedure.  DO NOT TAKE 7 DAYS PRIOR TO TEST- Trulicity (dulaglutide) Ozempic, Wegovy (semaglutide) Mounjaro (tirzepatide) Bydureon Bcise (exanatide extended release)  DO NOT TAKE 1 DAY PRIOR TO YOUR TEST Rybelsus (semaglutide) Adlyxin (lixisenatide) Victoza (liraglutide) Byetta (exanatide) ___________________________________________________________________________  We have sent the following  medications to your pharmacy for you to pick up at your convenience: Suprep  Start taking a fiber supplement  Benefiber or Metamucil   If your blood pressure at your visit was 140/90 or greater, please contact your primary care physician to follow up on this.  _______________________________________________________  If you are age 1 or older, your body mass index should be between 23-30. Your Body mass index is 29.1 kg/m. If this is out of the aforementioned range listed, please consider follow up with your Primary Care Provider.  If you are age 46 or younger, your body mass index should be between 19-25. Your Body mass index is 29.1 kg/m. If this is out of the aformentioned range listed, please consider follow up with your Primary Care Provider.   ________________________________________________________  The [facility] providers would like to encourage you to use [facility] to communicate with providers for non-urgent requests or questions.  Due to long hold times on the telephone, sending your provider a message by [facility] may be a faster and more efficient way to get a response.  Please allow 48 business hours for a response.  Please remember that this is for non-urgent requests.  _______________________________________________________  Due to recent changes in healthcare laws, you may see the results of your imaging and laboratory studies on MyChart before your provider has had a chance to review them.  We understand that in some cases there may be results that are confusing or concerning to you. Not all laboratory results come back in the same time frame and the provider may be waiting for multiple results in order to interpret others.  Please give  [place] 48 hours in order for your provider to thoroughly review all the results before contacting the office for clarification of your results.   Thank you for entrusting me with your care and for choosing [facility], Dr. Eulah Pont

## 2023-04-23 LAB — BASIC METABOLIC PANEL
BUN: 22 mg/dL (ref 6–23)
CO2: 27 meq/L (ref 19–32)
Calcium: 9.7 mg/dL (ref 8.4–10.5)
Chloride: 104 meq/L (ref 96–112)
Creatinine, Ser: 0.99 mg/dL (ref 0.40–1.20)
GFR: 58.08 mL/min — ABNORMAL LOW (ref >60.00)
Glucose, Bld: 91 mg/dL (ref 70–99)
Potassium: 3.9 meq/L (ref 3.5–5.1)
Sodium: 138 meq/L (ref 135–145)

## 2023-04-24 ENCOUNTER — Encounter: Payer: Self-pay | Admitting: Podiatry

## 2023-04-24 ENCOUNTER — Ambulatory Visit (INDEPENDENT_AMBULATORY_CARE_PROVIDER_SITE_OTHER): Payer: Medicare HMO | Admitting: Podiatry

## 2023-04-24 DIAGNOSIS — M79674 Pain in right toe(s): Secondary | ICD-10-CM

## 2023-04-24 DIAGNOSIS — B351 Tinea unguium: Secondary | ICD-10-CM | POA: Diagnosis not present

## 2023-04-24 DIAGNOSIS — M79675 Pain in left toe(s): Secondary | ICD-10-CM | POA: Diagnosis not present

## 2023-04-24 NOTE — Progress Notes (Signed)
       Subjective:  Patient ID: Beth Shepard, female    DOB: 08-06-1955,  MRN: [id number]  Beth Shepard presents to clinic today for:  Chief Complaint  Patient presents with   [facility]    Nail Trim. Last A1c from 7/24- 5.9   Patient notes nails are thick, discolored, elongated and painful in shoegear when trying to ambulate.    PCP is Zola Button, Pepper Pike R, DO.  Last seen on 12/28/2022  Allergies  Allergen Reactions   Prednisone Palpitations and Other (See Comments)    Dose pack causes vomiting; Causes Gerd N/v/d N/v/d    Codeine Nausea Only and Palpitations    REACTION: palpitation  REACTION: palpitation     Ibuprofen Other (See Comments) and Rash    REACTION: questionable. Pt states that she can take this sometimes. GERD    Review of Systems: Negative except as noted in the HPI.  Objective:  Beth Shepard is a pleasant 67 y.o. female in NAD. AAO x 3.  Vascular Examination: Capillary refill time is 3-5 seconds to toes bilateral. Palpable pedal pulses b/l LE. Digital hair present b/l. No pedal edema b/l. Skin temperature gradient WNL b/l. No varicosities b/l. No cyanosis or clubbing noted b/l.   Dermatological Examination: Pedal skin with normal turgor, texture and tone b/l. No open wounds. No interdigital macerations b/l. Toenails x10 are 3 mm thick, discolored, dystrophic with subungual debris. There is pain with compression of the nail plates.  They are severely elongated x10.       Latest Ref Rng & Units 12/28/2022    3:46 PM 06/15/2022   10:57 AM  Hemoglobin A1C  Hemoglobin-A1c <5.7 % of total Hgb 5.9  5.9    Assessment/Plan: 1. Pain due to onychomycosis of toenails of both feet    The mycotic/gryphotic toenails were sharply debrided x10 with sterile nail nippers and a power debriding burr to decrease bulk/thickness and length.    Return in about 3 months (around 07/25/2023) for [facility].   Clerance Lav, DPM, FACFAS [facility]     [address] [place], [place] [address]                Office [phone]  Fax [phone]

## 2023-04-29 ENCOUNTER — Telehealth: Payer: Self-pay | Admitting: Family Medicine

## 2023-04-29 ENCOUNTER — Ambulatory Visit (HOSPITAL_COMMUNITY)
Admission: RE | Admit: 2023-04-29 | Discharge: 2023-04-29 | Disposition: A | Discharge status: Home / Self Care | Payer: Medicare HMO | Source: Ambulatory Visit | Attending: Internal Medicine | Admitting: Internal Medicine

## 2023-04-29 DIAGNOSIS — K219 Gastro-esophageal reflux disease without esophagitis: Secondary | ICD-10-CM

## 2023-04-29 DIAGNOSIS — R197 Diarrhea, unspecified: Secondary | ICD-10-CM

## 2023-04-29 DIAGNOSIS — K429 Umbilical hernia without obstruction or gangrene: Secondary | ICD-10-CM | POA: Diagnosis not present

## 2023-04-29 DIAGNOSIS — R109 Unspecified abdominal pain: Secondary | ICD-10-CM | POA: Diagnosis not present

## 2023-04-29 DIAGNOSIS — R112 Nausea with vomiting, unspecified: Secondary | ICD-10-CM

## 2023-04-29 DIAGNOSIS — R1010 Upper abdominal pain, unspecified: Secondary | ICD-10-CM | POA: Diagnosis not present

## 2023-04-29 DIAGNOSIS — Z860100 Personal history of colon polyps, unspecified: Secondary | ICD-10-CM

## 2023-04-29 DIAGNOSIS — R159 Full incontinence of feces: Secondary | ICD-10-CM

## 2023-04-29 MED ORDER — IOHEXOL 300 MG/ML  SOLN
100.0000 mL | Freq: Once | INTRAMUSCULAR | Status: AC | PRN
  Administered 2023-04-29: 100 mL via INTRAVENOUS

## 2023-04-29 NOTE — Telephone Encounter (Signed)
Pt called stating that a PA will have to be submitted first of the year for her Ozempic to ensure she can continue to take it.

## 2023-05-02 ENCOUNTER — Telehealth: Payer: Self-pay | Admitting: Neurology

## 2023-05-02 NOTE — Telephone Encounter (Signed)
Pt is confused where to go for speech therapy. Because she has an appointment schedule with [facility] (next door) on 05/07/23. Informed her there is a referral for speech therapy sent to [facility] in [place] and gave her the phone number.

## 2023-05-03 ENCOUNTER — Encounter: Payer: Self-pay | Admitting: Internal Medicine

## 2023-05-05 IMAGING — CR DG CHEST 2V
2 series · 2 of 2 positions shown · non-contrast
Comparison: May 13, 2019

CLINICAL DATA: Cough.  Negative PONYF-CT.

EXAM:
CHEST - 2 VIEW

[w chest pa]
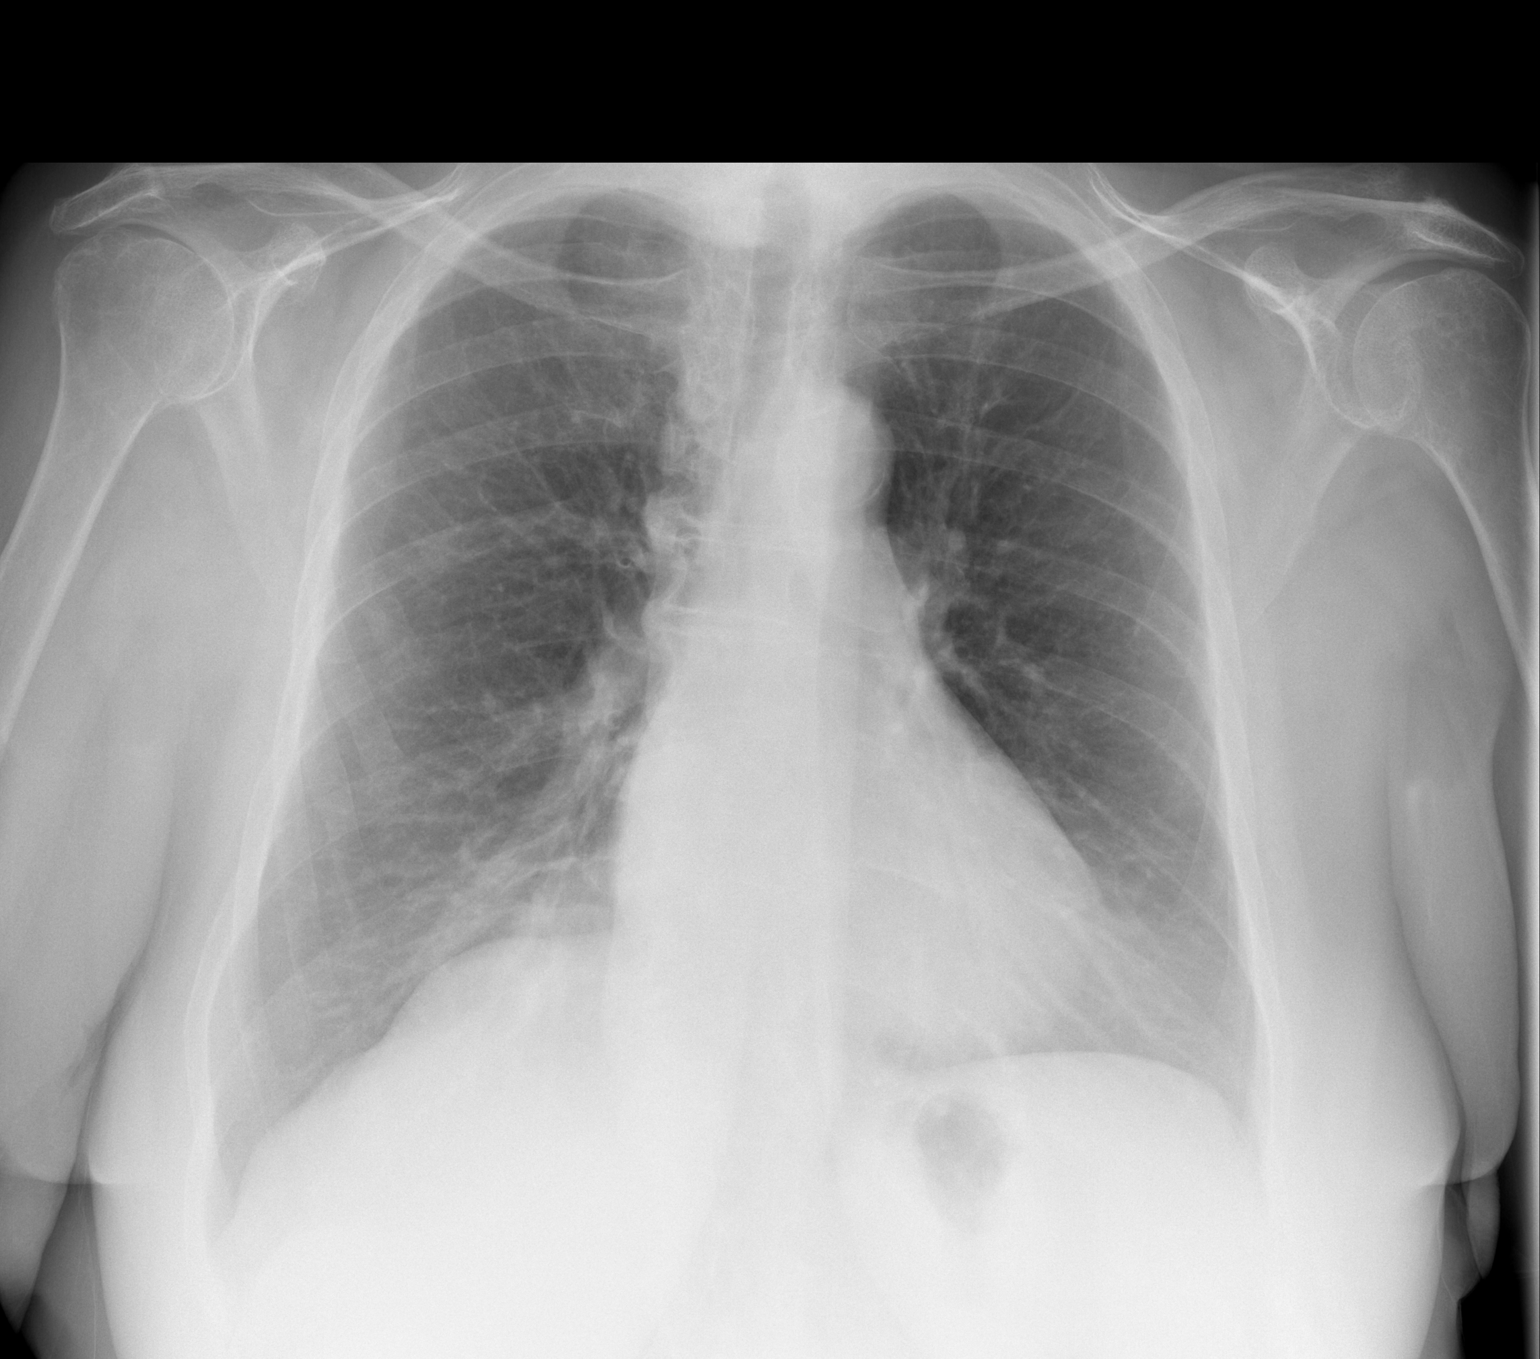

[w chest lat]
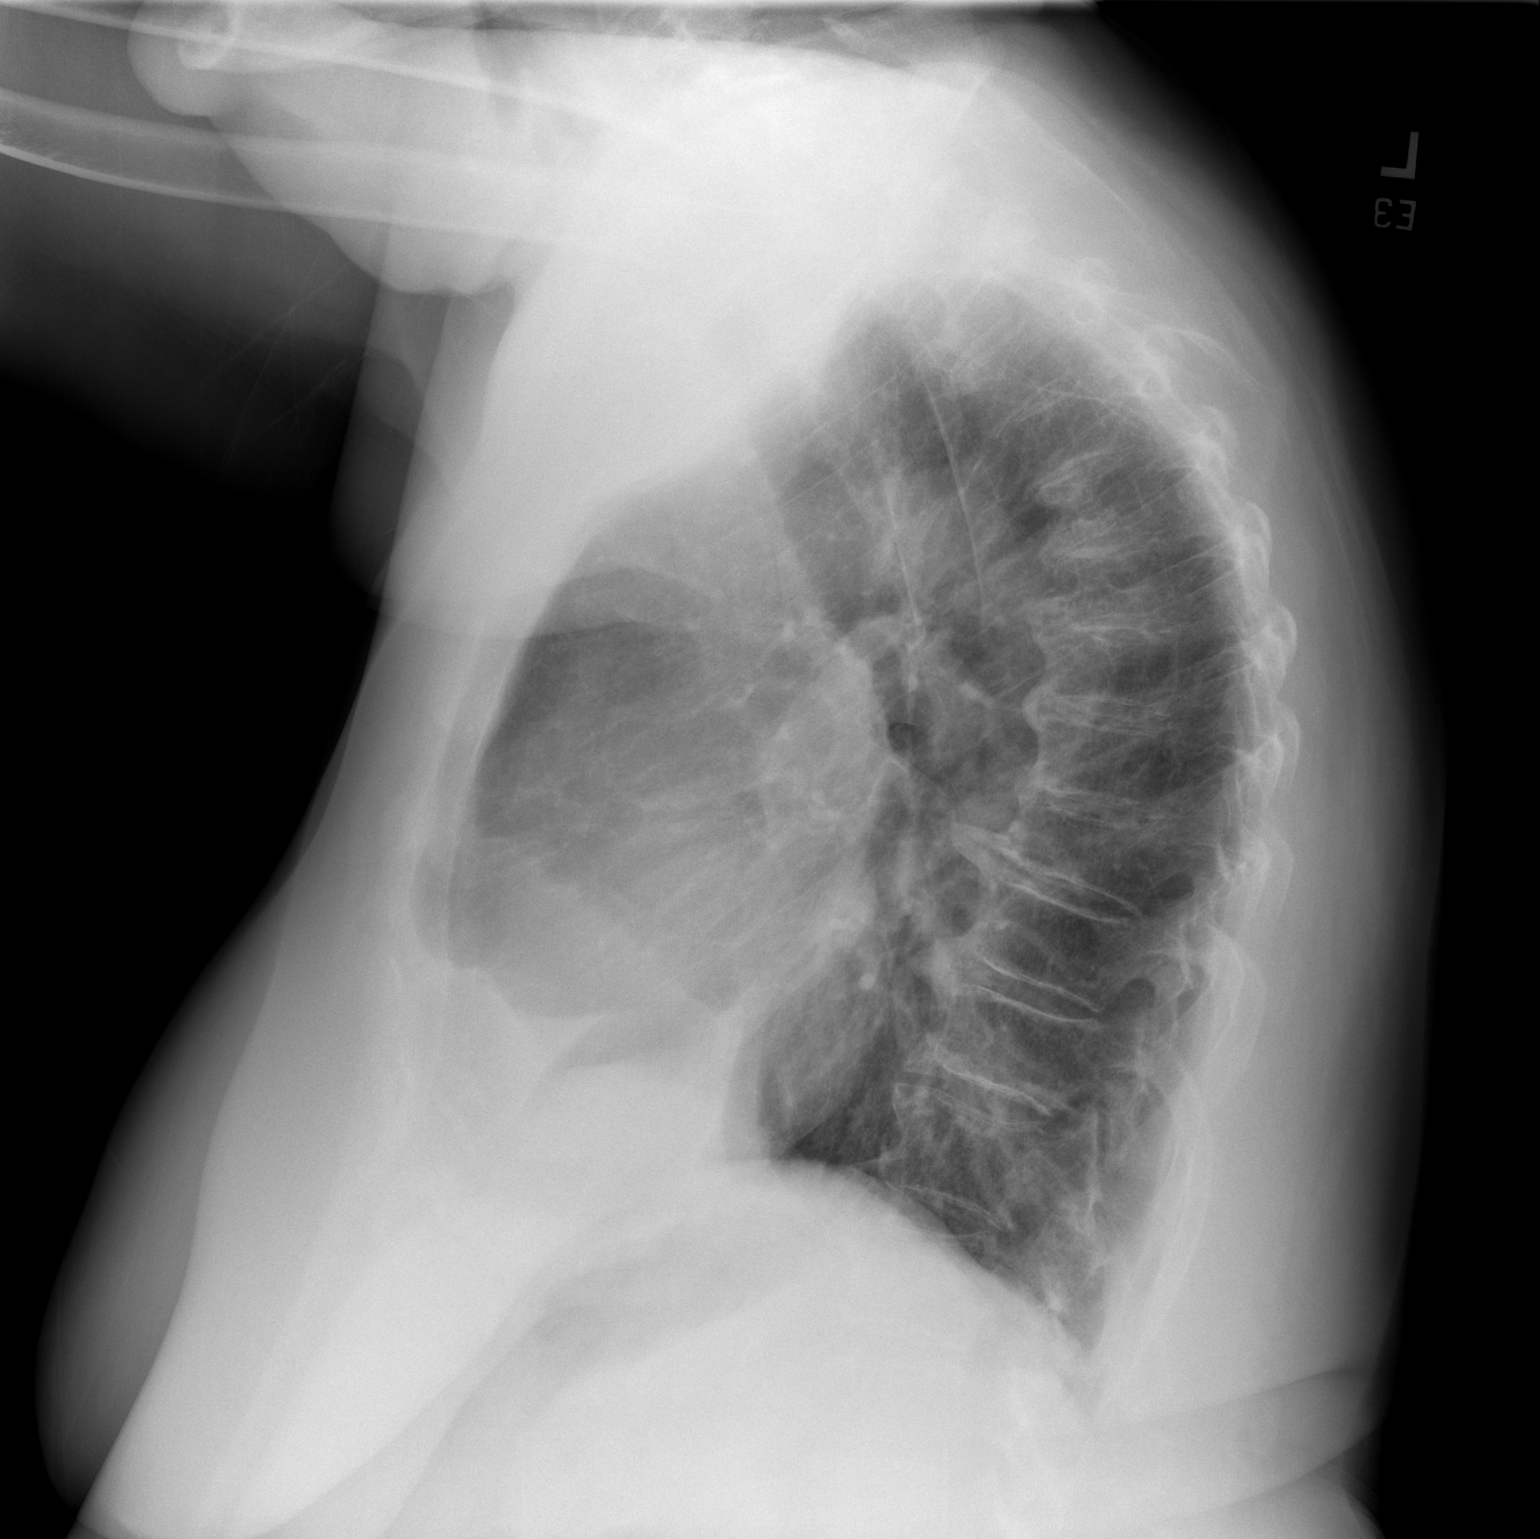

[2 of 2 positions shown; findings below may reference images not displayed]

FINDINGS: Opacity in the medial right lung base is stable, likely vascular
crowding. The heart, hila, mediastinum, lungs, and pleura are
otherwise unremarkable. No pneumothorax.
IMPRESSION: No acute abnormalities. Vascular crowding in the medial right lung
base.

## 2023-05-06 ENCOUNTER — Encounter: Payer: No Typology Code available for payment source | Admitting: Psychology

## 2023-05-07 ENCOUNTER — Encounter: Payer: Medicare HMO | Admitting: Speech Pathology

## 2023-05-08 ENCOUNTER — Encounter: Payer: No Typology Code available for payment source | Admitting: Psychology

## 2023-05-15 ENCOUNTER — Encounter: Payer: Self-pay | Admitting: Internal Medicine

## 2023-05-15 ENCOUNTER — Ambulatory Visit: Payer: Medicare HMO | Admitting: Internal Medicine

## 2023-05-15 ENCOUNTER — Other Ambulatory Visit: Payer: Self-pay | Admitting: Internal Medicine

## 2023-05-15 ENCOUNTER — Telehealth: Payer: Self-pay | Admitting: Internal Medicine

## 2023-05-15 VITALS — BP 110/47 | HR 71 | Temp 97.3°F | Resp 12 | Ht 61.0 in | Wt 154.0 lb

## 2023-05-15 DIAGNOSIS — D123 Benign neoplasm of transverse colon: Secondary | ICD-10-CM | POA: Diagnosis not present

## 2023-05-15 DIAGNOSIS — R197 Diarrhea, unspecified: Secondary | ICD-10-CM | POA: Diagnosis not present

## 2023-05-15 DIAGNOSIS — D125 Benign neoplasm of sigmoid colon: Secondary | ICD-10-CM

## 2023-05-15 DIAGNOSIS — D120 Benign neoplasm of cecum: Secondary | ICD-10-CM

## 2023-05-15 DIAGNOSIS — Z1211 Encounter for screening for malignant neoplasm of colon: Secondary | ICD-10-CM

## 2023-05-15 DIAGNOSIS — K648 Other hemorrhoids: Secondary | ICD-10-CM | POA: Diagnosis not present

## 2023-05-15 DIAGNOSIS — R1010 Upper abdominal pain, unspecified: Secondary | ICD-10-CM | POA: Diagnosis not present

## 2023-05-15 DIAGNOSIS — Z860101 Personal history of adenomatous and serrated colon polyps: Secondary | ICD-10-CM | POA: Diagnosis not present

## 2023-05-15 DIAGNOSIS — K2970 Gastritis, unspecified, without bleeding: Secondary | ICD-10-CM | POA: Diagnosis not present

## 2023-05-15 DIAGNOSIS — K5730 Diverticulosis of large intestine without perforation or abscess without bleeding: Secondary | ICD-10-CM | POA: Diagnosis not present

## 2023-05-15 DIAGNOSIS — K2951 Unspecified chronic gastritis with bleeding: Secondary | ICD-10-CM | POA: Diagnosis not present

## 2023-05-15 DIAGNOSIS — K2950 Unspecified chronic gastritis without bleeding: Secondary | ICD-10-CM | POA: Diagnosis not present

## 2023-05-15 MED ORDER — SODIUM CHLORIDE 0.9 % IV SOLN: 500.0000 mL | Freq: Once | INTRAVENOUS | Status: AC

## 2023-05-15 MED ORDER — PANTOPRAZOLE SODIUM 40 MG PO TBEC: 40.0000 mg | DELAYED_RELEASE_TABLET | Freq: Two times a day (BID) | ORAL | 3 refills | Status: DC

## 2023-05-15 NOTE — Op Note (Signed)
[facility] Patient Name: Beth Shepard Procedure Date: 05/15/2023 2:56 PM MRN: [id number] Endoscopist: Particia Lather , , [id number] Age: 67 Referring MD:  Date of Birth: October 05, 1955 Gender: Female Account #: [id number] Procedure:                Upper GI endoscopy Indications:              Epigastric abdominal pain, Heartburn, Diarrhea,                            Nausea with vomiting Medicines:                Monitored Anesthesia Care Procedure:                Pre-Anesthesia Assessment:                           - Prior to the procedure, a History and Physical                            was performed, and patient medications and                            allergies were reviewed. The patient's tolerance of                            previous anesthesia was also reviewed. The risks                            and benefits of the procedure and the sedation                            options and risks were discussed with the patient.                            All questions were answered, and informed consent                            was obtained. Prior Anticoagulants: The patient has                            taken no anticoagulant or antiplatelet agents. ASA                            Grade Assessment: II - A patient with mild systemic                            disease. After reviewing the risks and benefits,                            the patient was deemed in satisfactory condition to                            undergo the procedure.  After obtaining informed consent, the endoscope was                            passed under direct vision. Throughout the                            procedure, the patient's blood pressure, pulse, and                            oxygen saturations were monitored continuously. The                            Olympus Scope [id number] was introduced through the                            mouth, and advanced to  the second part of duodenum.                            The upper GI endoscopy was accomplished without                            difficulty. The patient tolerated the procedure                            well. Scope In: Scope Out: Findings:                 The examined esophagus was normal.                           Localized inflammation with hemorrhage                            characterized by congestion (edema), erosions,                            erythema and linear erosions was found in the                            gastric antrum. Biopsies were taken with a cold                            forceps for histology.                           The examined duodenum was normal. Biopsies were                            taken with a cold forceps for histology. Complications:            No immediate complications. Estimated Blood Loss:     Estimated blood loss was minimal. Impression:               - Normal esophagus.                           -  Gastritis with hemorrhage. Biopsied.                           - Normal examined duodenum. Biopsied. Recommendation:           - Await pathology results.                           - Use Protonix (pantoprazole) 40 mg PO BID for 8                            weeks, then QD                           - Avoid NSAID use.                           - Return to GI clinic in 2-3 months.                           - Perform a colonoscopy today. Dr Particia Lather "Beth Shepard" Beth Shepard,  05/15/2023 4:09:38 PM

## 2023-05-15 NOTE — Progress Notes (Unsigned)
Vss nad trans to pacu 

## 2023-05-15 NOTE — Progress Notes (Unsigned)
Recommend ECHO and cardiology clearance before outpatient procedures involving sedation.      01/02/2023 8:14 AM EDT  This result has an attachment that is not available.                                                   [facility]                                                [facility]                                                [place]                                                  [facility]                                                  Center                                                  Cardiac                                                Ultrasound-                                                [place],                                               [facility]                                                   Bldg                                                 [address]  [address]                                                [place]                                                 [place] [address]                                 Transthoracic Echocardiogram Report Name  Beth Shepard                                Study Date  01-01-2023        Height  61 in MRN  [id number]                                          Patient Location  [facility]   Weight  148 lb DOB  12-28-1955                                        Gender  Female                BSA  1.7 m2 Age  67 yrs                                            Ethnicity  1                  BP  102-66 mmHg Reason For Study  Dilated cardiomyopathy  CMS-HCC Ordering Physician  Merdis Delay     Performed By  Leanna Battles Referring Physician  Merdis Delay - - PROCEDURE A complete two-dimensional transthoracic echocardiogram was performed  2D, M-mode, spectral and color flow Doppler . Image Quality  Technically adequate. - SUMMARY The left ventricle is mildly dilated. There is normal left ventricular wall thickness. Left ventricular  systolic function is moderately reduced. LV ejection fraction = 30-35%. Abnormal  paradoxical  septal motion consistent with LBBB. The right ventricle is normal in size and function. The left atrium is mildly dilated. There is mild mitral regurgitation. There is mild tricuspid regurgitation. No pulmonary hypertension. The aortic sinus is normal size. The IVC is normal in size with an inspiratory collapse of greater then 50%, suggesting normal right atrial pressure. There is trivial pericardial effusion. Compared to prior study, LV function slightly declined. - FINDINGS LEFT VENTRICLE The left ventricle is mildly dilated. There is normal left ventricular wall thickness. Left ventricular systolic function is moderately reduced. LV ejection fraction = 30-35%. Left ventricular filling pattern is prolonged relaxation. Abnormal  paradoxical  septal motion consistent with LBBB. - RIGHT VENTRICLE The right ventricle is normal in size and function. LEFT ATRIUM The left atrium is mildly dilated. RIGHT ATRIUM Right  atrial size is normal. - AORTIC VALVE There is aortic valve sclerosis. The aortic valve opens well. There is no aortic stenosis. There is no aortic regurgitation. - MITRAL VALVE The mitral valve leaflets appear normal. There is mild mitral regurgitation. - TRICUSPID VALVE Structurally normal tricuspid valve. There is mild tricuspid regurgitation. No pulmonary hypertension. Estimated right ventricular systolic pressure is 21 mmHg. - PULMONIC VALVE The pulmonic valve is not well visualized. There is no pulmonic valvular regurgitation. - ARTERIES The aortic sinus is normal size. The ascending aorta is normal size. - VENOUS Pulmonary venous flow pattern not well visualized. The IVC is normal in size with an inspiratory collapse of greater then 50%, suggesting normal right atrial pressure. - EFFUSION There is trivial pericardial effusion. - - MMode-2D Measurements &  Calculations IVSd  1.0 cm               LA dim  3.7 cm             ESV MOD-sp4   81.8 ml     asc Aorta Diam LVIDd  5.4 cm              EDV MOD-sp4   130.0 ml     EDV MOD-sp2   121.0 ml    2.9 cm LVPWd  1.0 cm                                         ESV MOD-sp2   79.1 ml LVIDs  4.4 cm          _________________________________________________________________________________ LVOT diam  2.1 cm          SV MOD-sp4   48.2 ml       EF A4C  37.1 %            LA ESV  BP                             SI MOD-sp4   29.0 ml-m2                              56.2 ml          _________________________________________________________________________________ LA ESV Index  A2C          LA ESV Index  A4C          LA ESV Index  BP          SV A4C  48.2 ml 43.5 ml-m2                 26.2 ml-m2                 33.9 ml-m2 Doppler Measurements & Calculations MV E max vel            SV LVOT   53.1 ml             LV V1 VTI  15.6 cm     TR max vel 45.5 cm-sec             Ao V2 max  129.0 cm-sec                              213.1 cm-sec MV A max vel  Ao max PG  6.7 mmHg                                  TR max PG  18.2 mmHg 86.8 cm-sec             Ao V2 mean  100.4 cm-sec                             RVSP TR   21.2 mmHg MV E-A  0.52            Ao mean PG  4.3 mmHg Lat Peak E  Vel         Ao V2 VTI  26.0 cm 8.4 cm-sec E-Lat E`  5.4           AVA  VTI   2.0 cm2          _________________________________________________________________________________ RAP systole  3.0 mmHg   AS Dimensionless Index  VTI   AVAi VTI  cm^2-m^2     SV index LVOT                          0.60                                                      1.2 cm2                31.9 ml-m2 ______________________________________________________________________________ Reading Physician                  MD Seward Speck, MD, [phone] 01-02-2023 08 14 AM    Imaging Results - TRANSTHORACIC ECHOCARDIOGRAM (TTE) COMPLETE (01/01/2023 4:40 PM EDT) Procedure  Note  Seward Speck, MD - 01/02/2023  Formatting of this note might be different from the original. [facility] Cardiac Ultrasound- [place] Bldg [address] [place] [address] Transthoracic Echocardiogram Report Name Beth Shepard, Beth Shepard Study Date 01-01-2023 Height 61 in MRN [id number] Patient Location [facility] Weight 148 lb DOB 1955/09/27 Gender Female BSA 1.7 m2 Age 30 yrs Ethnicity 1 BP 102-66 mmHg Reason For Study Dilated cardiomyopathy CMS-HCC Ordering Physician Merdis Delay Performed By Leanna Battles Referring Physician Merdis Delay - - PROCEDURE A complete two-dimensional transthoracic echocardiogram was performed 2D, M-mode, spectral and color flow Doppler . Image Quality Technically adequate. - SUMMARY The left ventricle is mildly dilated. There is normal left ventricular wall thickness. Left ventricular systolic function is moderately reduced. LV ejection fraction = 30-35%. Abnormal paradoxical septal motion consistent with LBBB. The right ventricle is normal in size and function. The left atrium is mildly dilated. There is mild mitral regurgitation. There is mild tricuspid regurgitation. No pulmonary hypertension. The aortic sinus is normal size. The IVC is normal in size with an inspiratory collapse of greater then 50%, suggesting normal right atrial pressure. There is trivial pericardial effusion. Compared to prior study, LV function slightly declined. - FINDINGS LEFT VENTRICLE The left ventricle is mildly dilated. There is normal left ventricular wall thickness. Left ventricular systolic function is moderately reduced. LV ejection fraction = 30-35%. Left ventricular filling pattern is prolonged relaxation. Abnormal paradoxical septal motion consistent with  LBBB. - RIGHT VENTRICLE The right ventricle is normal in size and function. LEFT ATRIUM The left  atrium is mildly dilated. RIGHT ATRIUM Right atrial size is normal. - AORTIC VALVE There is aortic valve sclerosis. The aortic valve opens well. There is no aortic stenosis. There is no aortic regurgitation. - MITRAL VALVE The mitral valve leaflets appear normal. There is mild mitral regurgitation. - TRICUSPID VALVE Structurally normal tricuspid valve. There is mild tricuspid regurgitation. No pulmonary hypertension. Estimated right ventricular systolic pressure is 21 mmHg. - PULMONIC VALVE The pulmonic valve is not well visualized. There is no pulmonic valvular regurgitation. - ARTERIES The aortic sinus is normal size. The ascending aorta is normal size. - VENOUS Pulmonary venous flow pattern not well visualized. The IVC is normal in size with an inspiratory collapse of greater then 50%, suggesting normal right atrial pressure. - EFFUSION There is trivial pericardial effusion.

## 2023-05-15 NOTE — Patient Instructions (Addendum)
Call GI clinic to schedule follow up appointment for 2-3 months.    Use Protonix (pantoprazole) 40 mg PO BID for 8                            weeks, then QD                           - Avoid NSAID use.                           - Return to GI clinic in 2-3 months.                           - Perform a colonoscopy today. Dr Eulah Pont     YOU HAD AN ENDOSCOPIC PROCEDURE TODAY AT THE [facility]:   Refer to the procedure report that was given to you for any specific questions about what was found during the examination.  If the procedure report does not answer your questions, please call your gastroenterologist to clarify.  If you requested that your care partner not be given the details of your procedure findings, then the procedure report has been included in a sealed envelope for you to review at your convenience later.  YOU SHOULD EXPECT: Some feelings of bloating in the abdomen. Passage of more gas than usual.  Walking can help get rid of the air that was put into your GI tract during the procedure and reduce the bloating. If you had a lower endoscopy (such as a colonoscopy or flexible sigmoidoscopy) you may notice spotting of blood in your stool or on the toilet paper. If you underwent a bowel prep for your procedure, you may not have a normal bowel movement for a few days.  Please Note:  You might notice some irritation and congestion in your nose or some drainage.  This is from the oxygen used during your procedure.  There is no need for concern and it should clear up in a day or so.  SYMPTOMS TO REPORT IMMEDIATELY:  Following lower endoscopy (colonoscopy or flexible sigmoidoscopy):  Excessive amounts of blood in the stool  Significant tenderness or worsening of abdominal pains  Swelling of the abdomen that is new, acute  Fever of 100F or higher  Following upper endoscopy (EGD)  Vomiting of blood or coffee ground material  New chest pain or pain under the shoulder  blades  Painful or persistently difficult swallowing  New shortness of breath  Fever of 100F or higher  Black, tarry-looking stools  For urgent or emergent issues, a gastroenterologist can be reached at any hour by calling [phone]. Do not use MyChart messaging for urgent concerns.    DIET:  We do recommend a small meal at first, but then you may proceed to your regular diet.  Drink plenty of fluids but you should avoid alcoholic beverages for 24 hours.  ACTIVITY:  You should plan to take it easy for the rest of today and you should NOT DRIVE or use heavy machinery until tomorrow (because of the sedation medicines used during the test).    FOLLOW UP: Our staff will call the number listed on your records the next business day following your procedure.  We will call around 7:15- 8:00 am to check on you and address any questions or concerns that  you may have regarding the information given to you following your procedure. If we do not reach you, we will leave a message.     If any biopsies were taken you will be contacted by phone or by letter within the next 1-3 weeks.  Please call us at [phone] if you have not heard about the biopsies in 3 weeks.    SIGNATURES/CONFIDENTIALITY: You and/or your care partner have signed paperwork which will be entered into your electronic medical record.  These signatures attest to the fact that that the information above on your After Visit Summary has been reviewed and is understood.  Full responsibility of the confidentiality of this discharge information lies with you and/or your care-partner.

## 2023-05-15 NOTE — Op Note (Signed)
[facility] Patient Name: Beth Shepard Procedure Date: 05/15/2023 2:55 PM MRN: [id number] Endoscopist: Particia Lather , , [id number] Age: 67 Referring MD:  Date of Birth: 1956/04/12 Gender: Female Account #: [id number] Procedure:                Colonoscopy Indications:              High risk colon cancer surveillance: Personal                            history of colonic polyps, Incidental - Diarrhea Medicines:                Monitored Anesthesia Care Procedure:                Pre-Anesthesia Assessment:                           - Prior to the procedure, a History and Physical                            was performed, and patient medications and                            allergies were reviewed. The patient's tolerance of                            previous anesthesia was also reviewed. The risks                            and benefits of the procedure and the sedation                            options and risks were discussed with the patient.                            All questions were answered, and informed consent                            was obtained. Prior Anticoagulants: The patient has                            taken no anticoagulant or antiplatelet agents. ASA                            Grade Assessment: II - A patient with mild systemic                            disease. After reviewing the risks and benefits,                            the patient was deemed in satisfactory condition to                            undergo the procedure.  After obtaining informed consent, the colonoscope                            was passed under direct vision. Throughout the                            procedure, the patient's blood pressure, pulse, and                            oxygen saturations were monitored continuously. The                            CF [id number] #[id number] was introduced through the anus                            and  advanced to the the cecum, identified by                            appendiceal orifice and ileocecal valve. The                            colonoscopy was performed without difficulty. The                            patient tolerated the procedure well. The quality                            of the bowel preparation was good. The ileocecal                            valve, appendiceal orifice, and rectum were                            photographed. Scope In: 3:22:53 PM Scope Out: 4:00:39 PM Scope Withdrawal Time: 0 hours 25 minutes 44 seconds  Total Procedure Duration: 0 hours 37 minutes 46 seconds  Findings:                 Biopsies for histology were taken with a cold                            forceps from the entire colon for evaluation of                            microscopic colitis.                           A 20 mm polyp was found in the cecum. The polyp was                            sessile. The polyp was removed with a piecemeal                            technique using a cold snare. Resection and  retrieval were complete.                           Five sessile polyps were found in the transverse                            colon and cecum. The polyps were 3 to 6 mm in size.                            These polyps were removed with a cold snare.                            Resection and retrieval were complete.                           A 3 mm polyp was found in the sigmoid colon. The                            polyp was sessile. The polyp was removed with a                            cold snare. Resection and retrieval were complete.                           Multiple diverticula were found in the sigmoid                            colon.                           Non-bleeding internal hemorrhoids were found during                            retroflexion. Complications:            No immediate complications. Estimated Blood Loss:     Estimated  blood loss was minimal. Impression:               - One 20 mm polyp in the cecum, removed piecemeal                            using a cold snare. Resected and retrieved.                           - Five 3 to 6 mm polyps in the transverse colon and                            in the cecum, removed with a cold snare. Resected                            and retrieved.                           - One 3 mm polyp in the sigmoid colon, removed with  a cold snare. Resected and retrieved.                           - Diverticulosis in the sigmoid colon.                           - Non-bleeding internal hemorrhoids.                           - Biopsies were taken with a cold forceps from the                            entire colon for evaluation of microscopic colitis. Recommendation:           - Discharge patient to home (with escort).                           - Await pathology results.                           - The findings and recommendations were discussed                            with the patient. Dr Particia Lather "[name]" Leonides Schanz,  05/15/2023 4:16:19 PM

## 2023-05-15 NOTE — Progress Notes (Signed)
Called to room to assist during endoscopic procedure.  Patient ID and intended procedure confirmed with present staff. Received instructions for my participation in the procedure from the performing physician.  

## 2023-05-15 NOTE — Progress Notes (Unsigned)
GASTROENTEROLOGY PROCEDURE H&P NOTE   Primary Care Physician: Donato Schultz, DO    Reason for Procedure:   N&V, epigastric ab pain, GERD, diarrhea, history of colon polyps  Plan:    EGD/colonoscopy  Patient is appropriate for endoscopic procedure(s) in the ambulatory ([facility]) setting.  The nature of the procedure, as well as the risks, benefits, and alternatives were carefully and thoroughly reviewed with the patient. Ample time for discussion and questions allowed. The patient understood, was satisfied, and agreed to proceed.     HPI: Beth Shepard is a 66 y.o. female who presents for EGD/colonoscopy for evaluation of N&V, epigastric ab pain, GERD, diarrhea, and history of colon polyps .  Patient was most recently seen in the Gastroenterology Clinic on 04/22/23.  No interval change in medical history since that appointment. Please refer to that note for full details regarding GI history and clinical presentation.   Past Medical History:  Diagnosis Date   Anxiety    Bilateral edema of lower extremity    Swelling in legs and ankles   Congestive heart failure (HCC)    COPD (chronic obstructive pulmonary disease) (HCC)    Diabetes mellitus    type 2   Gastric polyps    GERD (gastroesophageal reflux disease)    chronic   Hyperlipidemia    Hypertension    IBS (irritable bowel syndrome)    LBBB (left bundle branch block)    Dr. Josefa Half, [facility]   Pneumonia    hx of   PONV (postoperative nausea and vomiting)    Had N/V after having tubal ligation and D&C   Rheumatoid arthritis (HCC)    Shingles     Past Surgical History:  Procedure Laterality Date   CARDIAC CATHETERIZATION     No PCI   CHOLECYSTECTOMY     COLONOSCOPY  06/06/2005   normal    DILATION AND CURETTAGE OF UTERUS     ESOPHAGOGASTRODUODENOSCOPY  06/06/2005, 11/17/2013   JOINT REPLACEMENT Right 10/24/2015   rt hip replacement   KNEE ARTHROSCOPY Left    TOTAL HIP ARTHROPLASTY Right  10/24/2015   Procedure: RIGHT TOTAL HIP ARTHROPLASTY ANTERIOR APPROACH;  Surgeon: Tarry Kos, MD;  Location: [facility];  Service: Orthopedics;  Laterality: Right;   TUBAL LIGATION     VAGINAL HYSTERECTOMY      Prior to Admission medications   Medication Sig Start Date End Date Taking? Authorizing Provider  albuterol (PROAIR HFA) 108 (90 Base) MCG/ACT inhaler INHALE 2 PUFFS INTO THE LUNGS EVERY 6 HOURS AS NEEDED FOR WHEEZE 07/06/21   Copland, Gwenlyn Found, MD  ALPRAZolam (XANAX) 0.25 MG tablet Take 1 tablet (0.25 mg total) by mouth 3 (three) times daily as needed. 12/28/22   Donato Schultz, DO  aspirin 81 MG tablet Take 81 mg by mouth daily.    [provider]  blood glucose meter kit and supplies KIT Dispense based on patient and insurance preference. Use up to four times daily as directed. 09/18/21   Donato Schultz, DO  Calcium Carb-Cholecalciferol (CALCIUM-VITAMIN D) 500-200 MG-UNIT tablet Take 1 tablet by mouth daily.    [provider]  carvedilol (COREG) 12.5 MG tablet Take 12.5 mg by mouth 2 (two) times daily. 09/20/22   [provider]  fluticasone (FLONASE) 50 MCG/ACT nasal spray Place 2 sprays into both nostrils daily. 05/25/21   Donato Schultz, DO  furosemide (LASIX) 20 MG tablet Take 1 tablet (20 mg total) by mouth  daily. 12/17/22   Zola Button, Grayling Congress, DO  Na Sulfate-K Sulfate-Mg Sulf 17.5-3.13-1.6 GM/177ML SOLN Use as directed; may use generic; goodrx card if insurance will not cover generic 04/22/23   Imogene Burn, MD  ondansetron (ZOFRAN) 4 MG tablet Take 1 tablet (4 mg total) by mouth every 8 (eight) hours as needed for nausea or vomiting. Take 1-2 tablets before prep 04/22/23   Imogene Burn, MD  pantoprazole (PROTONIX) 40 MG tablet Take 1 tablet (40 mg total) by mouth in the morning. ** CALL OFFICE TO SCHEDULE APPOINTMENT Patient not taking: Reported on 04/22/2023 05/17/21   Esterwood, Amy S, PA-C  rosuvastatin (CRESTOR) 10 MG tablet Take 1  tablet (10 mg total) by mouth daily. (Ok to take morning or evening) For cholesterol 12/28/22   Zola Button, Myrene Buddy R, DO  Semaglutide,0.25 or 0.5MG /DOS, (OZEMPIC, 0.25 OR 0.5 MG/DOSE,) 2 MG/3ML SOPN INJECT 0.5 MG INTO THE SKIN ONE TIME PER WEEK 01/31/23   Zola Button, Grayling Congress, DO  spironolactone (ALDACTONE) 25 MG tablet Take 1 tablet (25 mg total) by mouth every other day. 01/18/23   Donato Schultz, DO    Current Outpatient Medications  Medication Sig Dispense Refill   albuterol (PROAIR HFA) 108 (90 Base) MCG/ACT inhaler INHALE 2 PUFFS INTO THE LUNGS EVERY 6 HOURS AS NEEDED FOR WHEEZE 18 each 2   ALPRAZolam (XANAX) 0.25 MG tablet Take 1 tablet (0.25 mg total) by mouth 3 (three) times daily as needed. 90 tablet 0   aspirin 81 MG tablet Take 81 mg by mouth daily.     blood glucose meter kit and supplies KIT Dispense based on patient and insurance preference. Use up to four times daily as directed. 1 each 0   Calcium Carb-Cholecalciferol (CALCIUM-VITAMIN D) 500-200 MG-UNIT tablet Take 1 tablet by mouth daily.     carvedilol (COREG) 12.5 MG tablet Take 12.5 mg by mouth 2 (two) times daily.     fluticasone (FLONASE) 50 MCG/ACT nasal spray Place 2 sprays into both nostrils daily. 16 g 6   furosemide (LASIX) 20 MG tablet Take 1 tablet (20 mg total) by mouth daily. 90 tablet 0   Na Sulfate-K Sulfate-Mg Sulf 17.5-3.13-1.6 GM/177ML SOLN Use as directed; may use generic; goodrx card if insurance will not cover generic 354 mL 0   ondansetron (ZOFRAN) 4 MG tablet Take 1 tablet (4 mg total) by mouth every 8 (eight) hours as needed for nausea or vomiting. Take 1-2 tablets before prep 4 tablet 0   pantoprazole (PROTONIX) 40 MG tablet Take 1 tablet (40 mg total) by mouth in the morning. ** CALL OFFICE TO SCHEDULE APPOINTMENT (Patient not taking: Reported on 04/22/2023) 90 tablet 0   rosuvastatin (CRESTOR) 10 MG tablet Take 1 tablet (10 mg total) by mouth daily. (Ok to take morning or evening) For cholesterol 90  tablet 1   Semaglutide,0.25 or 0.5MG /DOS, (OZEMPIC, 0.25 OR 0.5 MG/DOSE,) 2 MG/3ML SOPN INJECT 0.5 MG INTO THE SKIN ONE TIME PER WEEK 9 mL 1   spironolactone (ALDACTONE) 25 MG tablet Take 1 tablet (25 mg total) by mouth every other day. 45 tablet 1   Current Facility-Administered Medications  Medication Dose Route Frequency Provider Last Rate Last Admin   0.9 %  sodium chloride infusion  500 mL Intravenous Once Imogene Burn, MD        Allergies as of 05/15/2023 - Review Complete 05/15/2023  Allergen Reaction Noted   Prednisone Palpitations and Other (See Comments) 09/03/2011  Codeine Nausea Only and Palpitations 10/12/2005   Ibuprofen Other (See Comments) and Rash 10/12/2005    Family History  Problem Relation Age of Onset   Lymphoma Mother    Heart disease Mother        chf, cabg x6   Hypertension Mother    GER disease Mother    Hiatal hernia Mother    Gallstones Mother    Rheum arthritis Father    Dementia Father    Brain cancer Sister        brain stage 4   Heart disease Maternal Grandmother 62       MI   Heart disease Maternal Grandfather    Stroke Paternal Grandmother    Hypertension Paternal Grandmother    Stroke Paternal Grandfather    Hypertension Paternal Grandfather    Hypertension Daughter    Colon cancer Neg Hx    Esophageal cancer Neg Hx    Pancreatic cancer Neg Hx    Liver disease Neg Hx    Kidney disease Neg Hx    Stomach cancer Neg Hx    Rectal cancer Neg Hx     Social History   Socioeconomic History   Marital status: Widowed    Spouse name: Not on file   Number of children: 2   Years of education: Not on file   Highest education level: Not on file  Occupational History   Occupation: Administive - retired    Associate Professor: [employer]  Tobacco Use   Smoking status: Former    Current packs/day: 0.00    Types: Cigarettes    Start date: 06/25/1969    Quit date: 06/25/2002    Years since quitting: 20.9   Smokeless tobacco: Never   Vaping Use   Vaping status: Never Used  Substance and Sexual Activity   Alcohol use: Yes    Comment: rare   Drug use: No   Sexual activity: Not Currently    Partners: Male  Other Topics Concern   Not on file  Social History Narrative   Limited exercise per cardiology   Right handed   Caffeine-2 cups daily   Lives alone, still driving, has gotten lost 1 time   Social Determinants of Health   Financial Resource Strain: Low Risk  (11/07/2021)   Overall Financial Resource Strain (CARDIA)    Difficulty of Paying Living Expenses: Not very hard  Food Insecurity: Not on file  Transportation Needs: No Transportation Needs (01/17/2022)   PRAPARE - Administrator, Civil Service (Medical): No    Lack of Transportation (Non-Medical): No  Physical Activity: Insufficiently Active (02/14/2023)   Exercise Vital Sign    Days of Exercise per Week: 3 days    Minutes of Exercise per Session: 10 min  Stress: Not on file  Social Connections: Not on file  Intimate Partner Violence: Not on file    Physical Exam: Vital signs in last 24 hours: BP 122/61   Pulse 65   Temp (!) 97.3 F (36.3 C)   Ht 5\' 1"  (1.549 m)   Wt 154 lb (69.9 kg)   SpO2 96%   BMI 29.10 kg/m  GEN: NAD EYE: Sclerae anicteric ENT: MMM CV: Non-tachycardic Pulm: No increased WOB GI: Soft NEURO:  Alert & Oriented   Eulah Pont, MD [facility]   05/15/2023 2:53 PM

## 2023-05-15 NOTE — Telephone Encounter (Signed)
Inbound call from patient requesting a call to discuss CT scan results. Please advise.

## 2023-05-15 NOTE — Progress Notes (Unsigned)
Recommend ECHO and cardiology clearance before outpatient procedures involving sedation.      01/02/2023 8:14 AM EDT  This result has an attachment that is not available.                                                   [facility]                                                [facility]                                                [place]                                                  Medical                                                  Center                                                  Cardiac                                                Ultrasound-                                                [place],                                               Heart Center                                                   Bldg                                                 [address]  [address]                                                [place]                                                 [place] [address]                                 Transthoracic Echocardiogram Report Name  [name], [name]                                Study Date  01-01-2023        Height  61 in MRN  [id number]                                          Patient Location  [facility]   Weight  148 lb DOB  12-28-1955                                        Gender  Female                BSA  1.7 m2 Age  67 yrs                                            Ethnicity  1                  BP  102-66 mmHg Reason For Study  Dilated cardiomyopathy  CMS-HCC Ordering Physician  Merdis Delay     Performed By  Leanna Battles Referring Physician  Merdis Delay - - PROCEDURE A complete two-dimensional transthoracic echocardiogram was performed  2D, M-mode, spectral and color flow Doppler . Image Quality  Technically adequate. - SUMMARY The left ventricle is mildly dilated. There is normal left ventricular wall thickness. Left ventricular  systolic function is moderately reduced. LV ejection fraction = 30-35%. Abnormal  paradoxical  septal motion consistent with LBBB. The right ventricle is normal in size and function. The left atrium is mildly dilated. There is mild mitral regurgitation. There is mild tricuspid regurgitation. No pulmonary hypertension. The aortic sinus is normal size. The IVC is normal in size with an inspiratory collapse of greater then 50%, suggesting normal right atrial pressure. There is trivial pericardial effusion. Compared to prior study, LV function slightly declined. - FINDINGS LEFT VENTRICLE The left ventricle is mildly dilated. There is normal left ventricular wall thickness. Left ventricular systolic function is moderately reduced. LV ejection fraction = 30-35%. Left ventricular filling pattern is prolonged relaxation. Abnormal  paradoxical  septal motion consistent with LBBB. - RIGHT VENTRICLE The right ventricle is normal in size and function. LEFT ATRIUM The left atrium is mildly dilated. RIGHT ATRIUM Right  atrial size is normal. - AORTIC VALVE There is aortic valve sclerosis. The aortic valve opens well. There is no aortic stenosis. There is no aortic regurgitation. - MITRAL VALVE The mitral valve leaflets appear normal. There is mild mitral regurgitation. - TRICUSPID VALVE Structurally normal tricuspid valve. There is mild tricuspid regurgitation. No pulmonary hypertension. Estimated right ventricular systolic pressure is 21 mmHg. - PULMONIC VALVE The pulmonic valve is not well visualized. There is no pulmonic valvular regurgitation. - ARTERIES The aortic sinus is normal size. The ascending aorta is normal size. - VENOUS Pulmonary venous flow pattern not well visualized. The IVC is normal in size with an inspiratory collapse of greater then 50%, suggesting normal right atrial pressure. - EFFUSION There is trivial pericardial effusion. - - MMode-2D Measurements &  Calculations IVSd  1.0 cm               LA dim  3.7 cm             ESV MOD-sp4   81.8 ml     asc Aorta Diam LVIDd  5.4 cm              EDV MOD-sp4   130.0 ml     EDV MOD-sp2   121.0 ml    2.9 cm LVPWd  1.0 cm                                         ESV MOD-sp2   79.1 ml LVIDs  4.4 cm          _________________________________________________________________________________ LVOT diam  2.1 cm          SV MOD-sp4   48.2 ml       EF A4C  37.1 %            LA ESV  BP                             SI MOD-sp4   29.0 ml-m2                              56.2 ml          _________________________________________________________________________________ LA ESV Index  A2C          LA ESV Index  A4C          LA ESV Index  BP          SV A4C  48.2 ml 43.5 ml-m2                 26.2 ml-m2                 33.9 ml-m2 Doppler Measurements & Calculations MV E max vel            SV LVOT   53.1 ml             LV V1 VTI  15.6 cm     TR max vel 45.5 cm-sec             Ao V2 max  129.0 cm-sec                              213.1 cm-sec MV A max vel  Ao max PG  6.7 mmHg                                  TR max PG  18.2 mmHg 86.8 cm-sec             Ao V2 mean  100.4 cm-sec                             RVSP TR   21.2 mmHg MV E-A  0.52            Ao mean PG  4.3 mmHg Lat Peak E  Vel         Ao V2 VTI  26.0 cm 8.4 cm-sec E-Lat E`  5.4           AVA  VTI   2.0 cm2          _________________________________________________________________________________ RAP systole  3.0 mmHg   AS Dimensionless Index  VTI   AVAi VTI  cm^2-m^2     SV index LVOT                          0.60                                                      1.2 cm2                31.9 ml-m2 ______________________________________________________________________________ Reading Physician                  MD Seward Speck, MD, [phone] 01-02-2023 08 14 AM    Imaging Results - TRANSTHORACIC ECHOCARDIOGRAM (TTE) COMPLETE (01/01/2023 4:40 PM EDT) Procedure  Note  Seward Speck, MD - 01/02/2023  Formatting of this note might be different from the original. [facility] Cardiac Ultrasound- [place] Bldg [address] [place] [address] Transthoracic Echocardiogram Report Name SHALONDA, LANGEN Study Date 01-01-2023 Height 61 in MRN [id number] Patient Location [facility] Weight 148 lb DOB 1955/09/27 Gender Female BSA 1.7 m2 Age 30 yrs Ethnicity 1 BP 102-66 mmHg Reason For Study Dilated cardiomyopathy CMS-HCC Ordering Physician Merdis Delay Performed By Leanna Battles Referring Physician Merdis Delay - - PROCEDURE A complete two-dimensional transthoracic echocardiogram was performed 2D, M-mode, spectral and color flow Doppler . Image Quality Technically adequate. - SUMMARY The left ventricle is mildly dilated. There is normal left ventricular wall thickness. Left ventricular systolic function is moderately reduced. LV ejection fraction = 30-35%. Abnormal paradoxical septal motion consistent with LBBB. The right ventricle is normal in size and function. The left atrium is mildly dilated. There is mild mitral regurgitation. There is mild tricuspid regurgitation. No pulmonary hypertension. The aortic sinus is normal size. The IVC is normal in size with an inspiratory collapse of greater then 50%, suggesting normal right atrial pressure. There is trivial pericardial effusion. Compared to prior study, LV function slightly declined. - FINDINGS LEFT VENTRICLE The left ventricle is mildly dilated. There is normal left ventricular wall thickness. Left ventricular systolic function is moderately reduced. LV ejection fraction = 30-35%. Left ventricular filling pattern is prolonged relaxation. Abnormal paradoxical septal motion consistent with  LBBB. - RIGHT VENTRICLE The right ventricle is normal in size and function. LEFT ATRIUM The left  atrium is mildly dilated. RIGHT ATRIUM Right atrial size is normal. - AORTIC VALVE There is aortic valve sclerosis. The aortic valve opens well. There is no aortic stenosis. There is no aortic regurgitation. - MITRAL VALVE The mitral valve leaflets appear normal. There is mild mitral regurgitation. - TRICUSPID VALVE Structurally normal tricuspid valve. There is mild tricuspid regurgitation. No pulmonary hypertension. Estimated right ventricular systolic pressure is 21 mmHg. - PULMONIC VALVE The pulmonic valve is not well visualized. There is no pulmonic valvular regurgitation. - ARTERIES The aortic sinus is normal size. The ascending aorta is normal size. - VENOUS Pulmonary venous flow pattern not well visualized. The IVC is normal in size with an inspiratory collapse of greater then 50%, suggesting normal right atrial pressure. - EFFUSION There is trivial pericardial effusion.

## 2023-05-16 ENCOUNTER — Telehealth: Payer: Self-pay | Admitting: *Deleted

## 2023-05-16 NOTE — Telephone Encounter (Signed)
CT abd/pelvis exam was 04/29/23. No report in EPIC. Called the patient to advise. Phone rings and then goes busy. No voicemail.

## 2023-05-16 NOTE — Telephone Encounter (Signed)
  Follow up Call-     05/15/2023    2:47 PM  Call back number  Post procedure Call Back phone  # [phone]  Permission to leave phone message Yes   No answer, no machine

## 2023-05-20 ENCOUNTER — Other Ambulatory Visit: Payer: Self-pay

## 2023-05-20 ENCOUNTER — Encounter: Payer: Self-pay | Admitting: Internal Medicine

## 2023-05-20 ENCOUNTER — Telehealth: Payer: Self-pay

## 2023-05-20 DIAGNOSIS — R935 Abnormal findings on diagnostic imaging of other abdominal regions, including retroperitoneum: Secondary | ICD-10-CM

## 2023-05-20 DIAGNOSIS — R918 Other nonspecific abnormal finding of lung field: Secondary | ICD-10-CM

## 2023-05-20 LAB — SURGICAL PATHOLOGY

## 2023-05-20 NOTE — Telephone Encounter (Signed)
-----   Message from Imogene Burn sent at 05/20/2023  8:59 AM EST ----- Hi Beth, please let the patient know that her CT did not show any obvious source of abdominal pain. It did show a new spiculated air space opacity in the right lower lobe of her lung that could be due to infection or inflammation, but could also be concerning for malignancy. We will need to do a chest CT without contrast for further evaluation of this lesion if the patient is amenable.

## 2023-05-20 NOTE — Telephone Encounter (Signed)
PT returned call and wants to have CT done at [facility] MRI center

## 2023-05-20 NOTE — Telephone Encounter (Signed)
Spoke with the patient. She agrees to the additional imaging. She will call back with the name of a location closer to her home in [place].

## 2023-05-20 NOTE — Telephone Encounter (Signed)
Orders faxed to [facility] at [phone].

## 2023-05-20 NOTE — Telephone Encounter (Signed)
Tiffany from [facility] called regarding patient results provider is already aware Beth (nurse) already put order in for CT chest and notified patient.

## 2023-05-29 ENCOUNTER — Ambulatory Visit: Payer: Medicare HMO | Attending: Neurology | Admitting: Speech Pathology

## 2023-05-29 ENCOUNTER — Other Ambulatory Visit: Payer: Self-pay

## 2023-05-29 ENCOUNTER — Encounter: Payer: Self-pay | Admitting: Speech Pathology

## 2023-05-29 DIAGNOSIS — R4789 Other speech disturbances: Secondary | ICD-10-CM | POA: Insufficient documentation

## 2023-05-29 DIAGNOSIS — R4701 Aphasia: Secondary | ICD-10-CM | POA: Insufficient documentation

## 2023-05-29 NOTE — Therapy (Unsigned)
OUTPATIENT SPEECH LANGUAGE PATHOLOGY APHASIA EVALUATION   Patient Name: Beth Shepard MRN: [id number] DOB:Dec 25, 1955, 67 y.o., female Today's Date: 05/30/2023  PCP: Donato Schultz, DO REFERRING PROVIDER: Windell Norfolk, MD  END OF SESSION:  End of Session - 05/29/23 1559     Visit Number 1    Number of Visits 25    Date for SLP Re-Evaluation 08/21/23    SLP Start Time 1445    SLP Stop Time  1530    SLP Time Calculation (min) 45 min    Activity Tolerance Patient tolerated treatment well             Past Medical History:  Diagnosis Date   Anxiety    Bilateral edema of lower extremity    Swelling in legs and ankles   Congestive heart failure (HCC)    COPD (chronic obstructive pulmonary disease) (HCC)    Diabetes mellitus    type 2   Gastric polyps    GERD (gastroesophageal reflux disease)    chronic   Hyperlipidemia    Hypertension    IBS (irritable bowel syndrome)    LBBB (left bundle branch block)    Dr. Josefa Half, [facility]   Pneumonia    hx of   PONV (postoperative nausea and vomiting)    Had N/V after having tubal ligation and D&C   Rheumatoid arthritis (HCC)    Shingles    Past Surgical History:  Procedure Laterality Date   CARDIAC CATHETERIZATION     No PCI   CHOLECYSTECTOMY     COLONOSCOPY  06/06/2005   normal    DILATION AND CURETTAGE OF UTERUS     ESOPHAGOGASTRODUODENOSCOPY  06/06/2005, 11/17/2013   JOINT REPLACEMENT Right 10/24/2015   rt hip replacement   KNEE ARTHROSCOPY Left    TOTAL HIP ARTHROPLASTY Right 10/24/2015   Procedure: RIGHT TOTAL HIP ARTHROPLASTY ANTERIOR APPROACH;  Surgeon: Tarry Kos, MD;  Location: [facility] OR;  Service: Orthopedics;  Laterality: Right;   TUBAL LIGATION     VAGINAL HYSTERECTOMY     Patient Active Problem List   Diagnosis Date Noted   Rheumatoid arthritis involving multiple joints (HCC) 12/22/2021   Type 2 diabetes mellitus with diabetic peripheral angiopathy without gangrene, without  long-term current use of insulin (HCC) 12/22/2021   Hyperlipidemia 09/18/2021   Memory loss 09/08/2021   Thrush 09/08/2021   IBS (irritable bowel syndrome) 05/11/2020   Uncontrolled type 2 diabetes mellitus with hyperglycemia (HCC) 02/01/2020   Nausea without vomiting 01/27/2019   Preventative health care 12/18/2017   Infected sebaceous cyst of skin 07/19/2017   DCM (dilated cardiomyopathy) (HCC) 07/10/2017   Fibromyalgia 04/05/2017   Chronic pain syndrome 04/05/2017   Pain in both lower extremities 02/06/2017   Chronic venous insufficiency 02/06/2017   Cellulitis of lower extremity 08/28/2016   Pain in right ankle and joints of right foot 08/09/2016   Peripheral edema 07/10/2016   Trochanteric bursitis, right hip 05/03/2016   Chronic kidney disease (CKD) stage G3a/A1, moderately decreased glomerular filtration rate (GFR) between 45-59 mL/min/1.73 square meter and albuminuria creatinine ratio less than 30 mg/g (HCC) 04/07/2016   Primary osteoarthritis of right hip 10/24/2015   Hip joint replacement status 10/24/2015   Chest pain 08/25/2015   Right hip pain 08/25/2015   Chest pain, precordial 08/10/2015   Precordial pain 08/10/2015   Cardiomyopathy (HCC) 01/18/2015   Block, bundle branch, left 01/18/2015   Left bundle-branch block 01/18/2015   Diabetes (HCC) 01/18/2015   Nausea alone 12/17/2013  Alternating constipation and diarrhea 11/19/2013   Bloating 11/19/2013   Obesity (BMI 30-39.9) 10/21/2013   Multiple joint pain 11/13/2012   MYALGIA 08/29/2010   ABDOMINAL PAIN OTHER SPECIFIED SITE 08/22/2009   Osteoarthritis 03/17/2009   Cellulitis and abscess of face 12/17/2008   Disturbance of skin sensation 12/17/2008   Bronchitis 05/07/2008   Anxiety 12/04/2007   SINUSITIS, ACUTE NOS 12/19/2006   Diabetes mellitus, type II (HCC) 10/08/2006   Hyperlipidemia LDL goal <70 10/08/2006   Essential hypertension 10/08/2006   COPD (chronic obstructive pulmonary disease) (HCC)  10/08/2006   GERD 10/08/2006   Other postprocedural status(V45.89) 10/08/2006    ONSET DATE: 04/03/2023 (referral date)  REFERRING DIAG: R47.89 (ICD-10-CM) - Word finding difficulty  THERAPY DIAG:  Aphasia  Rationale for Evaluation and Treatment: Rehabilitation  SUBJECTIVE:   SUBJECTIVE STATEMENT: "I know what I want but I can't remember the word" Pt accompanied by: family member daughter, Asher Muir  PERTINENT HISTORY: Per Neuro consult 04/03/23: This is 67 year old woman past medical history of hypertension, hyperlipidemia, diabetes mellitus, heart disease who is presenting with complaint of memory loss.  Basically patient describes the memory loss as word finding difficulty.  She said during the conversation, she knows what she wants to say but the words are just not coming out.  At times, this will make her frustrated.  Patient tells me that the symptoms have worsened since being told that her A1c was elevated at 11 about a year ago. Daughter tells me that patient does have word finding difficulty, she does have a problem with numbers and writing checks.  Sometimes she will ask how to spell simple words or how to do simple math.  She lives alone, independent in all activities of daily living,Patient is independent in all activities of daily living, normal MMSE.  Informed patient that this is likely normal cognition.  However for her word finding difficulty, I am going to send her to speech therapy.  Advised her to continue following up with her PCP   PAIN:  Are you having pain? No  FALLS: Has patient fallen in last 6 months?  No  LIVING ENVIRONMENT: Lives with: lives alone Lives in: House/apartment  PLOF:  Level of assistance: Independent with ADLs, Needed assistance with IADLS Employment: Retired  PATIENT GOALS: "To be able to talk and write better"  OBJECTIVE:  Note: Objective measures were completed at Evaluation unless otherwise noted.  DIAGNOSTIC FINDINGS:  MRI  IMPRESSION: 1. No acute intracranial abnormality. 2. Atrophy and white matter disease is mildly advanced for age. This likely reflects the sequela of chronic microvascular ischemia. 3. Remote lacunar infarcts of the pons.  COGNITION: Overall cognitive status: Within functional limits for tasks assessed Areas of impairment:    AUDITORY COMPREHENSION: Overall auditory comprehension: Impaired: moderately complex YES/NO questions: Impaired: complex Following directions: Impaired: moderately complex Conversation: Simple Interfering components: hearing Effective technique: extra processing time, pausing, repetition/stressing words, and visual/gestural cues  READING COMPREHENSION: Impaired: paragraph  EXPRESSION: verbal  VERBAL EXPRESSION: Level of generative/spontaneous verbalization: conversation Automatic speech: WNL  Repetition: Impaired: sentence Naming: Confrontation: 76-100% and Divergent: 51-75% Pragmatics: Appears intact Comments:  Interfering components:  N/A Effective technique: sentence completion and phonemic cues Non-verbal means of communication: N/A  WRITTEN EXPRESSION: Dominant hand: right Written expression: Impaired: sentence  MOTOR SPEECH: Overall motor speech: Appears intact   ORAL MOTOR EXAMINATION: Overall status: WFL Comments: missing dentition   STANDARDIZED ASSESSMENTS: QAB: Mild  PATIENT REPORTED OUTCOME MEASURES (PROM): Communication Participation Item Bank: 20/30 - She rated  2, or "a little" difficulty on each item   TODAY'S TREATMENT:                                                                                                                                         DATE:   05/29/23 (eval day): Reviewed goals, provided list of activities to facilitate language at home (see Patient Instructions) Aphasia education re: all areas of language are affected.  Discussed neuropsych consult to r/o PPA.. They cancelled this appointment but may want  to make a new appointment.   PATIENT EDUCATION: Education details: See Today's Treatment; See Patient Instructions; compensations for aphasia; aphasia ed Person educated: Patient and Child(ren) Education method: Explanation, Demonstration, Verbal cues, and Handouts Education comprehension: verbal cues required and needs further education   GOALS: Goals reviewed with patient? Yes  SHORT TERM GOALS: Target date: 06/26/23  Pt will name 12 items in personally relevant category with rare min A Baseline: Goal status: INITIAL  2.  Pt will use verbal compensations for aphasia in structured naming task 4/5 opportunities with occasional min A Baseline:  Goal status: INITIAL  3.  Pt/family will generate personally relevant words list to support word finding as low tech AAC Baseline:  Goal status: INITIAL  4.  Pt will complete simple mental money/time math with 80% accuracy with occasional min A Baseline:  Goal status: INITIAL  5.  Pt and family will carryover 3 strategies to support auditory comprehension of moderately complex information Baseline:  Goal status: INITIAL   LONG TERM GOALS: Target date: 08/21/23  Pt will use verbal compensations for aphasia in conversation as needed 4/5 opportunities with rare min A Baseline:  Goal status: INITIAL  2.  Pt will carryover 4 strategies to support auditory comprehension of moderately complex information Baseline:  Goal status: INITIAL  3.  Pt will use low tech AAC and multimodal communication to support word finding as needed with occasional min A Baseline:  Goal status: INITIAL  4. Pt will self advocate re: her communication disorder at 3 community venues to improve communicative interactions in the community Baseline:  Goal status: INITIAL   ASSESSMENT:  CLINICAL IMPRESSION: Patient is a 67 y.o. female who was seen today for mild aphasia. Pt and family report this began about 2 years ago and has gotten worse. She was "pushed  into early retirement" possibly due to mistakes at work at that time. She reports difficulty writing simple words and word finding. Auditory comprehension is also affected. They relay an incident where she didn't understand the bank teller and ended up opening a new account that she didn't intend to due to communication breakdown. Aphasic errors in sentence writing (radow/radio; spead/speak; unable to spell "night." She endorses some difficulty reading complex materials such as her bills and insurance information. Family is helping with this now. They are also helping her write checks due to difficulty with numbers  and spelling. Chip Boer reports inconsistent word finding difficulties for simple words. Chip Boer is successfully managing her medications and appointments. She is driving short distances and managing basic house hold tasks with success.  ? Etiology - consider neuropsych eval to r/o PPA. MMSE was WNL at neurology visit. I recommend skilled ST to maximize communication for independence, safety and to reduce caregiver burden. .   OBJECTIVE IMPAIRMENTS: include aphasia. These impairments are limiting patient from managing appointments, managing finances, ADLs/IADLs, and effectively communicating at home and in community. Factors affecting potential to achieve goals and functional outcome are financial resources. Patient will benefit from skilled SLP services to address above impairments and improve overall function.  REHAB POTENTIAL: Good  PLAN:  SLP FREQUENCY: 2x/week - family is electing 1x a week due to co-pay  SLP DURATION: 12 weeks  PLANNED INTERVENTIONS: 92507 Treatment of speech (30 or 45 min) , Language facilitation, Environmental controls, Cueing hierachy, Cognitive reorganization, Internal/external aids, Functional tasks, Multimodal communication approach, SLP instruction and feedback, Compensatory strategies, and Patient/family education    Dara Hoyer, CCC-SLP 05/30/2023, 2:20  PM

## 2023-05-29 NOTE — Patient Instructions (Addendum)
   Scattergories Outburst  Taboo Family Fued  Heads Up Easy cross word puzzles Naming items in categories   Use gestures and descriptions to help you communicate  Aphasia affects all aspects of language - talking, writing, listening and reading  Rondel Baton is a private ST - she may have a lower co-pay  Neuropyschology testing - Dr. Kieth Brightly

## 2023-05-31 ENCOUNTER — Other Ambulatory Visit: Payer: Self-pay | Admitting: Family Medicine

## 2023-05-31 DIAGNOSIS — I10 Essential (primary) hypertension: Secondary | ICD-10-CM

## 2023-05-31 DIAGNOSIS — E785 Hyperlipidemia, unspecified: Secondary | ICD-10-CM

## 2023-05-31 DIAGNOSIS — F419 Anxiety disorder, unspecified: Secondary | ICD-10-CM

## 2023-05-31 MED ORDER — ALPRAZOLAM 0.25 MG PO TABS: 0.2500 mg | ORAL_TABLET | Freq: Three times a day (TID) | ORAL | 0 refills | Status: DC | PRN

## 2023-05-31 NOTE — Telephone Encounter (Signed)
Pt is having increased anxiety and requesting a refill.   Medication: ALPRAZolam (XANAX) 0.25 MG tablet  Has the patient contacted their pharmacy? Yes.    Preferred Pharmacy:  [id number] - [place], [place] - [address] [name] [address] Phone: [phone]  Fax: [phone]

## 2023-05-31 NOTE — Telephone Encounter (Signed)
Requesting: Xanax Contract: 2018 UDS: 2018 Last OV: 12/28/22 Next OV: n/a Last Refill: 12/28/22, #[id number] RF Database:   Please advise

## 2023-06-10 ENCOUNTER — Telehealth: Payer: Self-pay | Admitting: Internal Medicine

## 2023-06-10 NOTE — Telephone Encounter (Addendum)
Inbound call from patient, requesting CT order to be sent to imaging center not [facility]. Patient prefers for any staff to only speak to her not to any daughters. She states she has a lot going on and does not want her daughters scheduling or doing anything for her.

## 2023-06-10 NOTE — Telephone Encounter (Signed)
  Beth Jefferson, LPN 16/03/9603 54:09 AM EST     Patient advised of findings. The patient asks to go to an imaging center closer to her home in [place]. She want to call me back with this information.   Imogene Burn, MD 05/20/2023  8:59 AM EST     Hi Beth, please let the patient know that her CT did not show any obvious source of abdominal pain. It did show a new spiculated air space opacity in the right lower lobe of her lung that could be due to infection or inflammation, but could also be concerning for malignancy. We will need to do a chest CT without contrast for further evaluation of this lesion if the patient is amenable.

## 2023-06-11 ENCOUNTER — Ambulatory Visit: Payer: Medicare HMO | Admitting: Speech Pathology

## 2023-06-11 DIAGNOSIS — R4701 Aphasia: Secondary | ICD-10-CM | POA: Diagnosis not present

## 2023-06-11 DIAGNOSIS — R4789 Other speech disturbances: Secondary | ICD-10-CM | POA: Diagnosis not present

## 2023-06-11 NOTE — Therapy (Signed)
OUTPATIENT SPEECH LANGUAGE PATHOLOGY TREATMENT NOTE   Patient Name: Beth Shepard MRN: [id number] DOB:1956-05-12, 67 y.o., female Today's Date: 06/11/2023  PCP: Donato Schultz, DO REFERRING PROVIDER: Windell Norfolk, MD  END OF SESSION:  End of Session - 06/11/23 1447     Visit Number 2    Number of Visits 25    Date for SLP Re-Evaluation 08/21/23    SLP Start Time 1447    SLP Stop Time  1530    SLP Time Calculation (min) 43 min    Activity Tolerance Patient tolerated treatment well             Past Medical History:  Diagnosis Date   Anxiety    Bilateral edema of lower extremity    Swelling in legs and ankles   Congestive heart failure (HCC)    COPD (chronic obstructive pulmonary disease) (HCC)    Diabetes mellitus    type 2   Gastric polyps    GERD (gastroesophageal reflux disease)    chronic   Hyperlipidemia    Hypertension    IBS (irritable bowel syndrome)    LBBB (left bundle branch block)    Dr. Josefa Half, [facility]   Pneumonia    hx of   PONV (postoperative nausea and vomiting)    Had N/V after having tubal ligation and D&C   Rheumatoid arthritis (HCC)    Shingles    Past Surgical History:  Procedure Laterality Date   CARDIAC CATHETERIZATION     No PCI   CHOLECYSTECTOMY     COLONOSCOPY  06/06/2005   normal    DILATION AND CURETTAGE OF UTERUS     ESOPHAGOGASTRODUODENOSCOPY  06/06/2005, 11/17/2013   JOINT REPLACEMENT Right 10/24/2015   rt hip replacement   KNEE ARTHROSCOPY Left    TOTAL HIP ARTHROPLASTY Right 10/24/2015   Procedure: RIGHT TOTAL HIP ARTHROPLASTY ANTERIOR APPROACH;  Surgeon: Tarry Kos, MD;  Location: [facility] OR;  Service: Orthopedics;  Laterality: Right;   TUBAL LIGATION     VAGINAL HYSTERECTOMY     Patient Active Problem List   Diagnosis Date Noted   Rheumatoid arthritis involving multiple joints (HCC) 12/22/2021   Type 2 diabetes mellitus with diabetic peripheral angiopathy without gangrene, without long-term  current use of insulin (HCC) 12/22/2021   Hyperlipidemia 09/18/2021   Memory loss 09/08/2021   Thrush 09/08/2021   IBS (irritable bowel syndrome) 05/11/2020   Uncontrolled type 2 diabetes mellitus with hyperglycemia (HCC) 02/01/2020   Nausea without vomiting 01/27/2019   Preventative health care 12/18/2017   Infected sebaceous cyst of skin 07/19/2017   DCM (dilated cardiomyopathy) (HCC) 07/10/2017   Fibromyalgia 04/05/2017   Chronic pain syndrome 04/05/2017   Pain in both lower extremities 02/06/2017   Chronic venous insufficiency 02/06/2017   Cellulitis of lower extremity 08/28/2016   Pain in right ankle and joints of right foot 08/09/2016   Peripheral edema 07/10/2016   Trochanteric bursitis, right hip 05/03/2016   Chronic kidney disease (CKD) stage G3a/A1, moderately decreased glomerular filtration rate (GFR) between 45-59 mL/min/1.73 square meter and albuminuria creatinine ratio less than 30 mg/g (HCC) 04/07/2016   Primary osteoarthritis of right hip 10/24/2015   Hip joint replacement status 10/24/2015   Chest pain 08/25/2015   Right hip pain 08/25/2015   Chest pain, precordial 08/10/2015   Precordial pain 08/10/2015   Cardiomyopathy (HCC) 01/18/2015   Block, bundle branch, left 01/18/2015   Left bundle-branch block 01/18/2015   Diabetes (HCC) 01/18/2015   Nausea alone 12/17/2013  Alternating constipation and diarrhea 11/19/2013   Bloating 11/19/2013   Obesity (BMI 30-39.9) 10/21/2013   Multiple joint pain 11/13/2012   MYALGIA 08/29/2010   ABDOMINAL PAIN OTHER SPECIFIED SITE 08/22/2009   Osteoarthritis 03/17/2009   Cellulitis and abscess of face 12/17/2008   Disturbance of skin sensation 12/17/2008   Bronchitis 05/07/2008   Anxiety 12/04/2007   SINUSITIS, ACUTE NOS 12/19/2006   Diabetes mellitus, type II (HCC) 10/08/2006   Hyperlipidemia LDL goal <70 10/08/2006   Essential hypertension 10/08/2006   COPD (chronic obstructive pulmonary disease) (HCC) 10/08/2006   GERD  10/08/2006   Other postprocedural status(V45.89) 10/08/2006    ONSET DATE: 04/03/2023 (referral date)  REFERRING DIAG: R47.89 (ICD-10-CM) - Word finding difficulty  THERAPY DIAG:  Aphasia  Rationale for Evaluation and Treatment: Rehabilitation  SUBJECTIVE:   SUBJECTIVE STATEMENT: Pt reports frustration with communicating with daughters.   PERTINENT HISTORY: Per Neuro consult 04/03/23: This is 67 year old woman past medical history of hypertension, hyperlipidemia, diabetes mellitus, heart disease who is presenting with complaint of memory loss.  Basically patient describes the memory loss as word finding difficulty.  She said during the conversation, she knows what she wants to say but the words are just not coming out.  At times, this will make her frustrated.  Patient tells me that the symptoms have worsened since being told that her A1c was elevated at 11 about a year ago. Daughter tells me that patient does have word finding difficulty, she does have a problem with numbers and writing checks.  Sometimes she will ask how to spell simple words or how to do simple math.  She lives alone, independent in all activities of daily living,Patient is independent in all activities of daily living, normal MMSE.  Informed patient that this is likely normal cognition.  However for her word finding difficulty, I am going to send her to speech therapy.  Advised her to continue following up with her PCP   PAIN:  Are you having pain? No  FALLS: Has patient fallen in last 6 months?  No  LIVING ENVIRONMENT: Lives with: lives alone Lives in: House/apartment  PLOF:  Level of assistance: Independent with ADLs, Needed assistance with IADLS Employment: Retired  PATIENT GOALS: "To be able to talk and write better"  OBJECTIVE:  Note: Objective measures were completed at Evaluation unless otherwise noted.  TODAY'S TREATMENT:      06/11/23: Pt presenting with fluent and appropriate speech in conversation  this date. No paraphasias evidenced. X2 instances of anomia in which pt utilizing circumlocution to aid in communication recovery. SLP broached creation of low the AAC and memory book, pt appears resistant. Tells SLP she has not trouble speaking to herself, it;s only when speaking to other people. Will require ongoing education for awareness/acceptance of deficits and employment of strategies to support employment of augmentative communication. SLP led pt through generative naming task with preferred topic of travel. Pt generates list of x8 places she has visited, recovery achieved for additional x2 with cued use of description and use of questioning cues.   05/29/23 (eval day): Reviewed goals, provided list of activities to facilitate language at home (see Patient Instructions) Aphasia education re: all areas of language are affected.  Discussed neuropsych consult to r/o PPA.. They cancelled this appointment but may want to make a new appointment.   PATIENT EDUCATION: Education details: See Today's Treatment; See Patient Instructions; compensations for aphasia; aphasia ed Person educated: Patient and Child(ren) Education method: Explanation, Demonstration, Verbal cues, and Handouts  Education comprehension: verbal cues required and needs further education   GOALS: Goals reviewed with patient? Yes  SHORT TERM GOALS: Target date: 06/26/23  Pt will name 12 items in personally relevant category with rare min A Baseline: Goal status: INITIAL  2.  Pt will use verbal compensations for aphasia in structured naming task 4/5 opportunities with occasional min A Baseline:  Goal status: INITIAL  3.  Pt/family will generate personally relevant words list to support word finding as low tech AAC Baseline:  Goal status: INITIAL  4.  Pt will complete simple mental money/time math with 80% accuracy with occasional min A Baseline:  Goal status: INITIAL  5.  Pt and family will carryover 3 strategies to  support auditory comprehension of moderately complex information Baseline:  Goal status: INITIAL   LONG TERM GOALS: Target date: 08/21/23  Pt will use verbal compensations for aphasia in conversation as needed 4/5 opportunities with rare min A Baseline:  Goal status: INITIAL  2.  Pt will carryover 4 strategies to support auditory comprehension of moderately complex information Baseline:  Goal status: INITIAL  3.  Pt will use low tech AAC and multimodal communication to support word finding as needed with occasional min A Baseline:  Goal status: INITIAL  4. Pt will self advocate re: her communication disorder at 3 community venues to improve communicative interactions in the community Baseline:  Goal status: INITIAL   ASSESSMENT:  CLINICAL IMPRESSION: Patient is a 67 y.o. female who was seen today for mild aphasia. Pt and family report this began about 2 years ago and has gotten worse. She was "pushed into early retirement" possibly due to mistakes at work at that time. She reports difficulty writing simple words and word finding. Auditory comprehension is also affected. They relay an incident where she didn't understand the bank teller and ended up opening a new account that she didn't intend to due to communication breakdown. Aphasic errors in sentence writing (radow/radio; spead/speak; unable to spell "night." She endorses some difficulty reading complex materials such as her bills and insurance information. Family is helping with this now. They are also helping her write checks due to difficulty with numbers and spelling. Chip Boer reports inconsistent word finding difficulties for simple words. Chip Boer is successfully managing her medications and appointments. She is driving short distances and managing basic house hold tasks with success.  ? Etiology - consider neuropsych eval to r/o PPA. MMSE was WNL at neurology visit. I recommend skilled ST to maximize communication for independence,  safety and to reduce caregiver burden. .   OBJECTIVE IMPAIRMENTS: include aphasia. These impairments are limiting patient from managing appointments, managing finances, ADLs/IADLs, and effectively communicating at home and in community. Factors affecting potential to achieve goals and functional outcome are financial resources. Patient will benefit from skilled SLP services to address above impairments and improve overall function.  REHAB POTENTIAL: Good  PLAN:  SLP FREQUENCY: 2x/week - family is electing 1x a week due to co-pay  SLP DURATION: 12 weeks  PLANNED INTERVENTIONS: 92507 Treatment of speech (30 or 45 min) , Language facilitation, Environmental controls, Cueing hierachy, Cognitive reorganization, Internal/external aids, Functional tasks, Multimodal communication approach, SLP instruction and feedback, Compensatory strategies, and Patient/family education    Maia Breslow, CCC-SLP 06/11/2023, 2:48 PM

## 2023-06-11 NOTE — Telephone Encounter (Signed)
CT of the chest order placed for [facility]. Message routed to Radiology Scheduling.

## 2023-06-11 NOTE — Addendum Note (Signed)
Addended by: [place] A on: 06/11/2023 09:42 AM   Modules accepted: Orders

## 2023-06-11 NOTE — Patient Instructions (Signed)
Beth Shepard --  10 years older than me Liked to go antiquing together He worked in [employer] He helped build [facility]  He was a really good guy, everywhere we would go people would want to talk to him  He died of Lukemia

## 2023-06-21 ENCOUNTER — Other Ambulatory Visit: Payer: Self-pay

## 2023-06-21 NOTE — Telephone Encounter (Signed)
Patient called and stated that she was not able to get her CT done due to her insurance not paying for it. Patient is requesting for a new referral for the CT scan to be done at [facility]. Please advise.

## 2023-06-21 NOTE — Telephone Encounter (Signed)
Order changed to [facility]. Patient contacted. Contact information for the imaging center given to the patient. She will call and set up her appointment for the CT of the chest.

## 2023-06-21 NOTE — Addendum Note (Signed)
Addended by: Heber Clifford A on: 06/21/2023 03:57 PM   Modules accepted: Orders

## 2023-06-26 ENCOUNTER — Other Ambulatory Visit: Payer: Self-pay | Admitting: Family Medicine

## 2023-06-28 ENCOUNTER — Encounter: Payer: Self-pay | Admitting: Internal Medicine

## 2023-07-02 ENCOUNTER — Ambulatory Visit: Payer: Medicare HMO | Admitting: Speech Pathology

## 2023-07-09 ENCOUNTER — Ambulatory Visit: Payer: Medicare HMO | Attending: Neurology | Admitting: Speech Pathology

## 2023-07-09 DIAGNOSIS — R4701 Aphasia: Secondary | ICD-10-CM

## 2023-07-09 NOTE — Therapy (Signed)
 OUTPATIENT SPEECH LANGUAGE PATHOLOGY TREATMENT NOTE   Patient Name: Beth Shepard MRN: [id number] DOB:17-Jan-1956, 68 y.o., female Today's Date: 07/09/2023  PCP: Antonio Cyndee Jamee JONELLE, DO REFERRING PROVIDER: Gregg Lek, MD  END OF SESSION:  End of Session - 07/09/23 1437     Visit Number 3    Number of Visits 25    Date for SLP Re-Evaluation 08/21/23    SLP Start Time 1437    SLP Stop Time  1523    SLP Time Calculation (min) 46 min    Activity Tolerance Patient tolerated treatment well             Past Medical History:  Diagnosis Date   Anxiety    Bilateral edema of lower extremity    Swelling in legs and ankles   Congestive heart failure (HCC)    COPD (chronic obstructive pulmonary disease) (HCC)    Diabetes mellitus    type 2   Gastric polyps    GERD (gastroesophageal reflux disease)    chronic   Hyperlipidemia    Hypertension    IBS (irritable bowel syndrome)    LBBB (left bundle branch block)    Dr. Shermon Counter, [facility]   Pneumonia    hx of   PONV (postoperative nausea and vomiting)    Had N/V after having tubal ligation and D&C   Rheumatoid arthritis (HCC)    Shingles    Past Surgical History:  Procedure Laterality Date   CARDIAC CATHETERIZATION     No PCI   CHOLECYSTECTOMY     COLONOSCOPY  06/06/2005   normal    DILATION AND CURETTAGE OF UTERUS     ESOPHAGOGASTRODUODENOSCOPY  06/06/2005, 11/17/2013   JOINT REPLACEMENT Right 10/24/2015   rt hip replacement   KNEE ARTHROSCOPY Left    TOTAL HIP ARTHROPLASTY Right 10/24/2015   Procedure: RIGHT TOTAL HIP ARTHROPLASTY ANTERIOR APPROACH;  Surgeon: Kay CHRISTELLA Cummins, MD;  Location: [facility] OR;  Service: Orthopedics;  Laterality: Right;   TUBAL LIGATION     VAGINAL HYSTERECTOMY     Patient Active Problem List   Diagnosis Date Noted   Rheumatoid arthritis involving multiple joints (HCC) 12/22/2021   Type 2 diabetes mellitus with diabetic peripheral angiopathy without gangrene, without long-term  current use of insulin  (HCC) 12/22/2021   Hyperlipidemia 09/18/2021   Memory loss 09/08/2021   Thrush 09/08/2021   IBS (irritable bowel syndrome) 05/11/2020   Uncontrolled type 2 diabetes mellitus with hyperglycemia (HCC) 02/01/2020   Nausea without vomiting 01/27/2019   Preventative health care 12/18/2017   Infected sebaceous cyst of skin 07/19/2017   DCM (dilated cardiomyopathy) (HCC) 07/10/2017   Fibromyalgia 04/05/2017   Chronic pain syndrome 04/05/2017   Pain in both lower extremities 02/06/2017   Chronic venous insufficiency 02/06/2017   Cellulitis of lower extremity 08/28/2016   Pain in right ankle and joints of right foot 08/09/2016   Peripheral edema 07/10/2016   Trochanteric bursitis, right hip 05/03/2016   Chronic kidney disease (CKD) stage G3a/A1, moderately decreased glomerular filtration rate (GFR) between 45-59 mL/min/1.73 square meter and albuminuria creatinine ratio less than 30 mg/g (HCC) 04/07/2016   Primary osteoarthritis of right hip 10/24/2015   Hip joint replacement status 10/24/2015   Chest pain 08/25/2015   Right hip pain 08/25/2015   Chest pain, precordial 08/10/2015   Precordial pain 08/10/2015   Cardiomyopathy (HCC) 01/18/2015   Block, bundle branch, left 01/18/2015   Left bundle-branch block 01/18/2015   Diabetes (HCC) 01/18/2015   Nausea alone 12/17/2013  Alternating constipation and diarrhea 11/19/2013   Bloating 11/19/2013   Obesity (BMI 30-39.9) 10/21/2013   Multiple joint pain 11/13/2012   MYALGIA 08/29/2010   ABDOMINAL PAIN OTHER SPECIFIED SITE 08/22/2009   Osteoarthritis 03/17/2009   Cellulitis and abscess of face 12/17/2008   Disturbance of skin sensation 12/17/2008   Bronchitis 05/07/2008   Anxiety 12/04/2007   SINUSITIS, ACUTE NOS 12/19/2006   Diabetes mellitus, type II (HCC) 10/08/2006   Hyperlipidemia LDL goal <70 10/08/2006   Essential hypertension 10/08/2006   COPD (chronic obstructive pulmonary disease) (HCC) 10/08/2006   GERD  10/08/2006   Other postprocedural status(V45.89) 10/08/2006    ONSET DATE: 04/03/2023 (referral date)  REFERRING DIAG: R47.89 (ICD-10-CM) - Word finding difficulty  THERAPY DIAG:  Aphasia  Rationale for Evaluation and Treatment: Rehabilitation  SUBJECTIVE:   SUBJECTIVE STATEMENT: Beth Shepard reports has been sick on and off since last visit. Beth Shepard reports not knowing word she wants to say and it goes black.   PERTINENT HISTORY: Per Neuro consult 04/03/23: This is 68 year old woman past medical history of hypertension, hyperlipidemia, diabetes mellitus, heart disease who is presenting with complaint of memory loss.  Basically patient describes the memory loss as word finding difficulty.  She said during the conversation, she knows what she wants to say but the words are just not coming out.  At times, this will make her frustrated.  Patient tells me that the symptoms have worsened since being told that her A1c was elevated at 11 about a year ago. Daughter tells me that patient does have word finding difficulty, she does have a problem with numbers and writing checks.  Sometimes she will ask how to spell simple words or how to do simple math.  She lives alone, independent in all activities of daily living,Patient is independent in all activities of daily living, normal MMSE.  Informed patient that this is likely normal cognition.  However for her word finding difficulty, I am going to send her to speech therapy.  Advised her to continue following up with her PCP   PAIN:  Are you having pain? No  FALLS: Has patient fallen in last 6 months?  No  LIVING ENVIRONMENT: Lives with: lives alone Lives in: House/apartment  PLOF:  Level of assistance: Independent with ADLs, Needed assistance with IADLS Employment: Retired  PATIENT GOALS: To be able to talk and write better  OBJECTIVE:  Note: Objective measures were completed at Evaluation unless otherwise noted.  TODAY'S TREATMENT:     07/09/23: Beth Shepard  reports daughters report she has trouble talking and with memory. She feels like she only has communication challenges when speaking to them. Unable to relay memory concerns, memory mistakes questionnaire completed. Concerns noted regarding paying bills on time, leaving things when meant to bring with, forget to take medication, forget important date, forget details in conversation. Given generative naming task, Beth Shepard generates x11 items per x1 category with no cues required. Speech this date overall fluent, occasional anomia.   06/11/23: Beth Shepard presenting with fluent and appropriate speech in conversation this date. No paraphasias evidenced. X2 instances of anomia in which Beth Shepard utilizing circumlocution to aid in communication recovery. SLP broached creation of low the AAC and memory book, Beth Shepard appears resistant. Tells SLP she has not trouble speaking to herself, it;s only when speaking to other people. Will require ongoing education for awareness/acceptance of deficits and employment of strategies to support employment of augmentative communication. SLP led Beth Shepard through generative naming task with preferred topic of travel. Beth Shepard generates list  of x8 places she has visited, recovery achieved for additional x2 with cued use of description and use of questioning cues.   05/29/23 (eval day): Reviewed goals, provided list of activities to facilitate language at home (see Patient Instructions) Aphasia education re: all areas of language are affected.  Discussed neuropsych consult to r/o PPA.. They cancelled this appointment but may want to make a new appointment.   PATIENT EDUCATION: Education details: See Today's Treatment; See Patient Instructions; compensations for aphasia; aphasia ed Person educated: Patient and Child(ren) Education method: Explanation, Demonstration, Verbal cues, and Handouts Education comprehension: verbal cues required and needs further education   GOALS: Goals reviewed with patient? Yes  SHORT  TERM GOALS: Target date: 06/26/23  Beth Shepard will name 12 items in personally relevant category with rare min A Baseline: Goal status: IN PROGRESS  2.  Beth Shepard will use verbal compensations for aphasia in structured naming task 4/5 opportunities with occasional min A Baseline:  Goal status: IN PROGRESS  3.  Beth Shepard/family will generate personally relevant words list to support word finding as low tech AAC Baseline:  Goal status: IN PROGRESS  4.  Beth Shepard will complete simple mental money/time math with 80% accuracy with occasional min A Baseline:  Goal status: IN PROGRESS  5.  Beth Shepard and family will carryover 3 strategies to support auditory comprehension of moderately complex information Baseline:  Goal status: IN PROGRESS   LONG TERM GOALS: Target date: 08/21/23  Beth Shepard will use verbal compensations for aphasia in conversation as needed 4/5 opportunities with rare min A Baseline:  Goal status: IN PROGRESS  2.  Beth Shepard will carryover 4 strategies to support auditory comprehension of moderately complex information Baseline:  Goal status: IN PROGRESS  3.  Beth Shepard will use low tech AAC and multimodal communication to support word finding as needed with occasional min A Baseline:  Goal status: IN PROGRESS  4. Beth Shepard will self advocate re: her communication disorder at 3 community venues to improve communicative interactions in the community Baseline:  Goal status: IN PROGRESS   ASSESSMENT:  CLINICAL IMPRESSION: Patient is a 68 y.o. female who was seen today for mild aphasia. Beth Shepard and family report this began about 2 years ago and has gotten worse. She was pushed into early retirement possibly due to mistakes at work at that time. She reports difficulty writing simple words and word finding. Auditory comprehension is also affected. They relay an incident where she didn't understand the bank teller and ended up opening a new account that she didn't intend to due to communication breakdown. Aphasic errors in sentence writing  (radow/radio; spead/speak; unable to spell night. She endorses some difficulty reading complex materials such as her bills and insurance information. Family is helping with this now. They are also helping her write checks due to difficulty with numbers and spelling. Beth Shepard reports inconsistent word finding difficulties for simple words. Beth Shepard is successfully managing her medications and appointments. She is driving short distances and managing basic house hold tasks with success.  ? Etiology - consider neuropsych eval to r/o PPA. MMSE was WNL at neurology visit. I recommend skilled ST to maximize communication for independence, safety and to reduce caregiver burden. .   OBJECTIVE IMPAIRMENTS: include aphasia. These impairments are limiting patient from managing appointments, managing finances, ADLs/IADLs, and effectively communicating at home and in community. Factors affecting potential to achieve goals and functional outcome are financial resources. Patient will benefit from skilled SLP services to address above impairments and improve overall function.  REHAB POTENTIAL:  Good  PLAN:  SLP FREQUENCY: 2x/week - family is electing 1x a week due to co-pay  SLP DURATION: 12 weeks  PLANNED INTERVENTIONS: 92507 Treatment of speech (30 or 45 min) , Language facilitation, Environmental controls, Cueing hierachy, Cognitive reorganization, Internal/external aids, Functional tasks, Multimodal communication approach, SLP instruction and feedback, Compensatory strategies, and Patient/family education    Harlene LITTIE Ned, CCC-SLP 07/09/2023, 2:38 PM

## 2023-07-09 NOTE — Patient Instructions (Addendum)
 Helpful Strategies:  If you see I can't think of the word or am getting frustration, please help.   Give me options, ask me questions to help me communicate.    For memory -- We need to identify memory concerns to come up with a plan to help.    Memory Strategies  W - Write it down  A - Associate it with something  R - Repeat it -- aloud   M - Mental Image   FOR YOUR ITEMS: pick a place to keep things. For example, keep your remote on the table next to where you sit.     MEMORY PRACTICE   Play the memory game  Try to remember 3-5 items on your store list without looking  Study a detailed picture in a magazine for 1 minute, then write down everything you can remember from the picture

## 2023-07-10 ENCOUNTER — Other Ambulatory Visit: Payer: Medicare HMO

## 2023-07-16 ENCOUNTER — Encounter: Payer: Self-pay | Admitting: Speech Pathology

## 2023-07-16 ENCOUNTER — Ambulatory Visit: Payer: Medicare HMO | Admitting: Speech Pathology

## 2023-07-16 ENCOUNTER — Other Ambulatory Visit: Payer: Self-pay | Admitting: Family Medicine

## 2023-07-16 DIAGNOSIS — R4701 Aphasia: Secondary | ICD-10-CM | POA: Diagnosis not present

## 2023-07-16 DIAGNOSIS — E785 Hyperlipidemia, unspecified: Secondary | ICD-10-CM

## 2023-07-16 NOTE — Patient Instructions (Addendum)
Continue use of strategies: Keeping items consistently in the same place: keys on table, remote on table (in bowel if used), phone on charger  Verbal reminders: Self talk! Example: "I am putting my keys on the hook" "I am putting my bag here"  Tape Checklist to the wall or door next to where you often leave your house! Check it each time you leave!

## 2023-07-16 NOTE — Therapy (Unsigned)
OUTPATIENT SPEECH LANGUAGE PATHOLOGY TREATMENT NOTE   Patient Name: Beth Shepard MRN: [id number] DOB:1956/03/14, 68 y.o., female Today's Date: 07/16/2023  PCP: Donato Schultz, DO REFERRING PROVIDER: Windell Norfolk, MD  END OF SESSION:  End of Session - 07/16/23 1444     Visit Number 4    Number of Visits 25    Date for SLP Re-Evaluation 08/21/23    SLP Start Time 1444    SLP Stop Time  1530    SLP Time Calculation (min) 46 min    Activity Tolerance Patient tolerated treatment well             Past Medical History:  Diagnosis Date   Anxiety    Bilateral edema of lower extremity    Swelling in legs and ankles   Congestive heart failure (HCC)    COPD (chronic obstructive pulmonary disease) (HCC)    Diabetes mellitus    type 2   Gastric polyps    GERD (gastroesophageal reflux disease)    chronic   Hyperlipidemia    Hypertension    IBS (irritable bowel syndrome)    LBBB (left bundle branch block)    Dr. Josefa Half, [facility]   Pneumonia    hx of   PONV (postoperative nausea and vomiting)    Had N/V after having tubal ligation and D&C   Rheumatoid arthritis (HCC)    Shingles    Past Surgical History:  Procedure Laterality Date   CARDIAC CATHETERIZATION     No PCI   CHOLECYSTECTOMY     COLONOSCOPY  06/06/2005   normal    DILATION AND CURETTAGE OF UTERUS     ESOPHAGOGASTRODUODENOSCOPY  06/06/2005, 11/17/2013   JOINT REPLACEMENT Right 10/24/2015   rt hip replacement   KNEE ARTHROSCOPY Left    TOTAL HIP ARTHROPLASTY Right 10/24/2015   Procedure: RIGHT TOTAL HIP ARTHROPLASTY ANTERIOR APPROACH;  Surgeon: Tarry Kos, MD;  Location: [facility] OR;  Service: Orthopedics;  Laterality: Right;   TUBAL LIGATION     VAGINAL HYSTERECTOMY     Patient Active Problem List   Diagnosis Date Noted   Rheumatoid arthritis involving multiple joints (HCC) 12/22/2021   Type 2 diabetes mellitus with diabetic peripheral angiopathy without gangrene, without long-term  current use of insulin (HCC) 12/22/2021   Hyperlipidemia 09/18/2021   Memory loss 09/08/2021   Thrush 09/08/2021   IBS (irritable bowel syndrome) 05/11/2020   Uncontrolled type 2 diabetes mellitus with hyperglycemia (HCC) 02/01/2020   Nausea without vomiting 01/27/2019   Preventative health care 12/18/2017   Infected sebaceous cyst of skin 07/19/2017   DCM (dilated cardiomyopathy) (HCC) 07/10/2017   Fibromyalgia 04/05/2017   Chronic pain syndrome 04/05/2017   Pain in both lower extremities 02/06/2017   Chronic venous insufficiency 02/06/2017   Cellulitis of lower extremity 08/28/2016   Pain in right ankle and joints of right foot 08/09/2016   Peripheral edema 07/10/2016   Trochanteric bursitis, right hip 05/03/2016   Chronic kidney disease (CKD) stage G3a/A1, moderately decreased glomerular filtration rate (GFR) between 45-59 mL/min/1.73 square meter and albuminuria creatinine ratio less than 30 mg/g (HCC) 04/07/2016   Primary osteoarthritis of right hip 10/24/2015   Hip joint replacement status 10/24/2015   Chest pain 08/25/2015   Right hip pain 08/25/2015   Chest pain, precordial 08/10/2015   Precordial pain 08/10/2015   Cardiomyopathy (HCC) 01/18/2015   Block, bundle branch, left 01/18/2015   Left bundle-branch block 01/18/2015   Diabetes (HCC) 01/18/2015   Nausea alone 12/17/2013  Alternating constipation and diarrhea 11/19/2013   Bloating 11/19/2013   Obesity (BMI 30-39.9) 10/21/2013   Multiple joint pain 11/13/2012   MYALGIA 08/29/2010   ABDOMINAL PAIN OTHER SPECIFIED SITE 08/22/2009   Osteoarthritis 03/17/2009   Cellulitis and abscess of face 12/17/2008   Disturbance of skin sensation 12/17/2008   Bronchitis 05/07/2008   Anxiety 12/04/2007   SINUSITIS, ACUTE NOS 12/19/2006   Diabetes mellitus, type II (HCC) 10/08/2006   Hyperlipidemia LDL goal <70 10/08/2006   Essential hypertension 10/08/2006   COPD (chronic obstructive pulmonary disease) (HCC) 10/08/2006   GERD  10/08/2006   Other postprocedural status(V45.89) 10/08/2006    ONSET DATE: 04/03/2023 (referral date)  REFERRING DIAG: R47.89 (ICD-10-CM) - Word finding difficulty  THERAPY DIAG:  Aphasia  Rationale for Evaluation and Treatment: Rehabilitation  SUBJECTIVE:   SUBJECTIVE STATEMENT: Pt reports compliance with HEP, reported still experiencing some difficulty remembering items/tasks when leaving the house.  PERTINENT HISTORY: Per Neuro consult 04/03/23: This is 68 year old woman past medical history of hypertension, hyperlipidemia, diabetes mellitus, heart disease who is presenting with complaint of memory loss.  Basically patient describes the memory loss as word finding difficulty.  She said during the conversation, she knows what she wants to say but the words are just not coming out.  At times, this will make her frustrated.  Patient tells me that the symptoms have worsened since being told that her A1c was elevated at 13 about a year ago. Daughter tells me that patient does have word finding difficulty, she does have a problem with numbers and writing checks.  Sometimes she will ask how to spell simple words or how to do simple math.  She lives alone, independent in all activities of daily living,Patient is independent in all activities of daily living, normal MMSE.  Informed patient that this is likely normal cognition.  However for her word finding difficulty, I am going to send her to speech therapy.  Advised her to continue following up with her PCP   PAIN:  Are you having pain? No  FALLS: Has patient fallen in last 6 months?  No  LIVING ENVIRONMENT: Lives with: lives alone Lives in: House/apartment  PLOF:  Level of assistance: Independent with ADLs, Needed assistance with IADLS Employment: Retired  PATIENT GOALS: "To be able to talk and write better"  OBJECTIVE:  Note: Objective measures were completed at Evaluation unless otherwise noted.  TODAY'S TREATMENT:      07/16/23:  Pt reported successful use of strategies at home and noted improvements remembering important items but shared she occasionally forgets her keys, bag, etc.when leaving the house. ST reviewed memory strategies and developed a visual aid to aid pt's difficulty with memory mistakes/concerns. ST implemented both semantic feature analysis (SFA) and phonological feature analysis (PFA) to target anomia. Pt required max-A during word finding tasks. ST noted pt was prone to mazing, communicated using vague language, and produced some phonemic paraphasias during unstructured conversation. ST plans to continue targeting word finding deficits in future sessions and modify pt's phone to support her ability to easily contact and communicate with family/friends.   07/09/23: Pt reports daughters report she has trouble talking and with memory. She feels like she only has communication challenges when speaking to them. Unable to relay memory concerns, memory mistakes questionnaire completed. Concerns noted regarding paying bills on time, leaving things when meant to bring with, forget to take medication, forget important date, forget details in conversation. Given generative naming task, pt generates x11 items per x1 category  with no cues required. Speech this date overall fluent, occasional anomia.   06/11/23: Pt presenting with fluent and appropriate speech in conversation this date. No paraphasias evidenced. X2 instances of anomia in which pt utilizing circumlocution to aid in communication recovery. SLP broached creation of low the AAC and memory book, pt appears resistant. Tells SLP she has not trouble speaking to herself, it;s only when speaking to other people. Will require ongoing education for awareness/acceptance of deficits and employment of strategies to support employment of augmentative communication. SLP led pt through generative naming task with preferred topic of travel. Pt generates list of x8 places she has  visited, recovery achieved for additional x2 with cued use of description and use of questioning cues.   05/29/23 (eval day): Reviewed goals, provided list of activities to facilitate language at home (see Patient Instructions) Aphasia education re: all areas of language are affected.  Discussed neuropsych consult to r/o PPA.. They cancelled this appointment but may want to make a new appointment.   PATIENT EDUCATION: Education details: See Today's Treatment; See Patient Instructions; compensations for aphasia; aphasia ed Person educated: Patient and Child(ren) Education method: Explanation, Demonstration, Verbal cues, and Handouts Education comprehension: verbal cues required and needs further education   GOALS: Goals reviewed with patient? Yes  SHORT TERM GOALS: Target date: 06/26/23  Pt will name 12 items in personally relevant category with rare min A Baseline: Goal status: IN PROGRESS  2.  Pt will use verbal compensations for aphasia in structured naming task 4/5 opportunities with occasional min A Baseline:  Goal status: IN PROGRESS  3.  Pt/family will generate personally relevant words list to support word finding as low tech AAC Baseline:  Goal status: IN PROGRESS  4.  Pt will complete simple mental money/time math with 80% accuracy with occasional min A Baseline:  Goal status: IN PROGRESS  5.  Pt and family will carryover 3 strategies to support auditory comprehension of moderately complex information Baseline:  Goal status: IN PROGRESS   LONG TERM GOALS: Target date: 08/21/23  Pt will use verbal compensations for aphasia in conversation as needed 4/5 opportunities with rare min A Baseline:  Goal status: IN PROGRESS  2.  Pt will carryover 4 strategies to support auditory comprehension of moderately complex information Baseline:  Goal status: IN PROGRESS  3.  Pt will use low tech AAC and multimodal communication to support word finding as needed with occasional min  A Baseline:  Goal status: IN PROGRESS  4. Pt will self advocate re: her communication disorder at 3 community venues to improve communicative interactions in the community Baseline:  Goal status: IN PROGRESS   ASSESSMENT:  CLINICAL IMPRESSION: Patient is a 68 y.o. female who was seen today for mild aphasia. Pt and family report this began about 2 years ago and has gotten worse. She was "pushed into early retirement" possibly due to mistakes at work at that time. She reports difficulty writing simple words and word finding. Auditory comprehension is also affected. They relay an incident where she didn't understand the bank teller and ended up opening a new account that she didn't intend to due to communication breakdown. Aphasic errors in sentence writing (radow/radio; spead/speak; unable to spell "night." She endorses some difficulty reading complex materials such as her bills and insurance information. Family is helping with this now. They are also helping her write checks due to difficulty with numbers and spelling. Chip Boer reports inconsistent word finding difficulties for simple words. Chip Boer is successfully managing  her medications and appointments. She is driving short distances and managing basic house hold tasks. Etiology - consider neuropsych eval to r/o PPA. MMSE was WNL at neurology visit. I recommend skilled ST to maximize communication for independence, safety and to reduce caregiver burden. .   OBJECTIVE IMPAIRMENTS: include aphasia. These impairments are limiting patient from managing appointments, managing finances, ADLs/IADLs, and effectively communicating at home and in community. Factors affecting potential to achieve goals and functional outcome are financial resources. Patient will benefit from skilled SLP services to address above impairments and improve overall function.  REHAB POTENTIAL: Good  PLAN:  SLP FREQUENCY: 2x/week - family is electing 1x a week due to co-pay  SLP  DURATION: 12 weeks  PLANNED INTERVENTIONS: 92507 Treatment of speech (30 or 45 min) , Language facilitation, Environmental controls, Cueing hierachy, Cognitive reorganization, Internal/external aids, Functional tasks, Multimodal communication approach, SLP instruction and feedback, Compensatory strategies, and Patient/family education    [place], Student-SLP 07/16/2023, 2:48 PM

## 2023-07-17 ENCOUNTER — Ambulatory Visit: Payer: Medicare HMO | Admitting: Podiatry

## 2023-07-18 ENCOUNTER — Other Ambulatory Visit: Payer: Self-pay | Admitting: Pharmacist

## 2023-07-18 DIAGNOSIS — E11649 Type 2 diabetes mellitus with hypoglycemia without coma: Secondary | ICD-10-CM

## 2023-07-18 DIAGNOSIS — Z7985 Long-term (current) use of injectable non-insulin antidiabetic drugs: Secondary | ICD-10-CM

## 2023-07-18 DIAGNOSIS — I429 Cardiomyopathy, unspecified: Secondary | ICD-10-CM

## 2023-07-18 DIAGNOSIS — I10 Essential (primary) hypertension: Secondary | ICD-10-CM

## 2023-07-18 NOTE — Progress Notes (Signed)
   07/18/2023 Name: Beth Shepard MRN: [id number] DOB: 1956/03/22  Chief Complaint  Patient presents with   Diabetes   Medication Management    Beth Shepard is a 68 y.o. year old female ; spoke with patient's daughter today by phone   They were referred to the pharmacist by their PCP for assistance in managing diabetes and medication access.    Subjective:  Diabetes:   Current medications: Ozempic 0.5mg  weekly (but per daughter she has only been giving 0.5mg  every OTHER week)  Patient has been seeing GI for abdominal pain and frequent loose stools. She has has recently had endoscopy and colonoscopy. Patient was to restart pantoprazole.  She also had abdominal Ultrasound which is not show any potential cause of her symptoms. There was an area of concern on her lung and a lung CT has been ordered. Per Beth Shepard's daughter, lung CT was scheduled for previous week but had to cancel - they are in need of rescheduling.   Hyperlipidemia/ASCVD Risk Reduction  Current lipid lowering medications: rosuvastatin   Antiplatelet regimen: aspirin 81mg  daily      Cardiomyopathy / Heart Failure (EF 30-35%.:  Current medications:  ACEi/ARB/ARNI: no SGLT2i: no Beta blocker: no  Mineralocorticoid Receptor Antagonist: yes Diuretic regimen: yes   Objective:  Lab Results  Component Value Date   HGBA1C 5.9 (H) 12/28/2022    Lab Results  Component Value Date   CREATININE 0.99 04/22/2023   BUN 22 04/22/2023   NA 138 04/22/2023   K 3.9 04/22/2023   CL 104 04/22/2023   CO2 27 04/22/2023    Lab Results  Component Value Date   CHOL 158 12/28/2022   HDL 67 12/28/2022   LDLCALC 75 12/28/2022   LDLDIRECT 148.0 09/11/2021   TRIG 79 12/28/2022   CHOLHDL 2.4 12/28/2022   BP Readings from Last 3 Encounters:  05/15/23 (!) 110/47  04/22/23 120/70  04/03/23 108/64     Medications Reviewed Today   Medications were not reviewed in this encounter       Assessment/Plan:    Diabetes: Currently controlled - GI symptoms could be related to GLP1 use by patient feels it is not. : - Recommend to try Ozempic 0.25mg  every other week to see if abdominal pain / GI symptoms improve (could consider SGLT2 due to cardiomyopathy but would needed to monitor blood pressure closely as patient has low blood pressure).    Hypertension: Currently controlled - Recommend to continue current blood pressure lowering medications.     Hyperlipidemia/ASCVD Risk Reduction:Currently controlled.  Continue rosuvastatin    Cardiomyopathy:Currently appropriately managed - low blood pressure has prevented addition of SGLT2 and beta blocker - Recommend to Continue furosemide, spironolactone .    Follow Up Plan: 3 months  Henrene Pastor, PharmD Clinical Pharmacist [facility]

## 2023-07-23 ENCOUNTER — Ambulatory Visit (HOSPITAL_BASED_OUTPATIENT_CLINIC_OR_DEPARTMENT_OTHER)
Admission: RE | Admit: 2023-07-23 | Discharge: 2023-07-23 | Disposition: A | Discharge status: Home / Self Care | Payer: Medicare HMO | Source: Ambulatory Visit | Attending: Family | Admitting: Family

## 2023-07-23 ENCOUNTER — Ambulatory Visit (INDEPENDENT_AMBULATORY_CARE_PROVIDER_SITE_OTHER): Payer: Medicare HMO | Admitting: Family

## 2023-07-23 ENCOUNTER — Ambulatory Visit: Payer: Medicare HMO | Admitting: Speech Pathology

## 2023-07-23 ENCOUNTER — Ambulatory Visit: Payer: Self-pay | Admitting: Family Medicine

## 2023-07-23 ENCOUNTER — Encounter: Payer: Self-pay | Admitting: Family

## 2023-07-23 VITALS — BP 100/70 | HR 82 | Temp 97.6°F | Resp 18 | Ht 61.0 in | Wt 149.2 lb

## 2023-07-23 DIAGNOSIS — M25511 Pain in right shoulder: Secondary | ICD-10-CM | POA: Diagnosis not present

## 2023-07-23 DIAGNOSIS — S022XXA Fracture of nasal bones, initial encounter for closed fracture: Secondary | ICD-10-CM | POA: Diagnosis not present

## 2023-07-23 DIAGNOSIS — S0990XA Unspecified injury of head, initial encounter: Secondary | ICD-10-CM | POA: Insufficient documentation

## 2023-07-23 DIAGNOSIS — M19011 Primary osteoarthritis, right shoulder: Secondary | ICD-10-CM | POA: Diagnosis not present

## 2023-07-23 DIAGNOSIS — R296 Repeated falls: Secondary | ICD-10-CM | POA: Diagnosis not present

## 2023-07-23 DIAGNOSIS — I6782 Cerebral ischemia: Secondary | ICD-10-CM | POA: Diagnosis not present

## 2023-07-23 DIAGNOSIS — R220 Localized swelling, mass and lump, head: Secondary | ICD-10-CM | POA: Diagnosis not present

## 2023-07-23 LAB — COMPREHENSIVE METABOLIC PANEL
ALT: 12 U/L (ref 0–35)
AST: 22 U/L (ref 0–37)
Albumin: 4.6 g/dL (ref 3.5–5.2)
Alkaline Phosphatase: 81 U/L (ref 39–117)
BUN: 16 mg/dL (ref 6–23)
CO2: 26 meq/L (ref 19–32)
Calcium: 9.8 mg/dL (ref 8.4–10.5)
Chloride: 104 meq/L (ref 96–112)
Creatinine, Ser: 1 mg/dL (ref 0.40–1.20)
GFR: 57.28 mL/min — ABNORMAL LOW (ref >60.00)
Glucose, Bld: 107 mg/dL — ABNORMAL HIGH (ref 70–99)
Potassium: 3.4 meq/L — ABNORMAL LOW (ref 3.5–5.1)
Sodium: 139 meq/L (ref 135–145)
Total Bilirubin: 0.7 mg/dL (ref 0.2–1.2)
Total Protein: 7.2 g/dL (ref 6.0–8.3)

## 2023-07-23 LAB — CBC WITH DIFFERENTIAL/PLATELET
Basophils Absolute: 0.1 10*3/uL (ref 0.0–0.1)
Basophils Relative: 0.9 % (ref 0.0–3.0)
Eosinophils Absolute: 0.1 10*3/uL (ref 0.0–0.7)
Eosinophils Relative: 1.2 % (ref 0.0–5.0)
HCT: 38.9 % (ref 36.0–46.0)
Hemoglobin: 12.8 g/dL (ref 12.0–15.0)
Lymphocytes Relative: 15.7 % (ref 12.0–46.0)
Lymphs Abs: 1.3 10*3/uL (ref 0.7–4.0)
MCHC: 32.8 g/dL (ref 30.0–36.0)
MCV: 86.9 fL (ref 78.0–100.0)
Monocytes Absolute: 0.8 10*3/uL (ref 0.1–1.0)
Monocytes Relative: 9.2 % (ref 3.0–12.0)
Neutro Abs: 6.1 10*3/uL (ref 1.4–7.7)
Neutrophils Relative %: 73 % (ref 43.0–77.0)
Platelets: 226 10*3/uL (ref 150.0–400.0)
RBC: 4.48 Mil/uL (ref 3.87–5.11)
RDW: 12.5 % (ref 11.5–15.5)
WBC: 8.3 10*3/uL (ref 4.0–10.5)

## 2023-07-23 NOTE — Telephone Encounter (Signed)
Chief Complaint: trip and fall Symptoms: bruise on forehead, right arm/shoulder pain, swelling of nose Frequency: occurred last night around 1930 Pertinent Negatives: Patient denies LOC, changes in speech or vision, unilateral numbness or weakness, nausea, vomiting, neck or back pain, use of blood thinners, black eyes. Disposition: [] ED /[] Urgent Care (no appt availability in office) / [x] Appointment(In office/virtual)/ []  [place] [facility]/ [] Home Care/ [] Refused Recommended Disposition /[] [facility]/ []  Follow-up with PCP Additional Notes: Patient poor historian, unable to describe her injuries well, redirected patient to triage questions. Patient states last night she had a trip and fall on her porch and landed face first. She states she tried to call for help for about 30 minutes or less and then was able to pull herself up on the railing. Patient states she had some bleeding from her nose that was able to stop. She had swelling of her nose that has improved after applying ice. She states she has a bruise 2 inches or less on her forehead and redness to the inner corners of her eyes. Patient calling her daughter to see if she can drive her to appointment. Patient aware if she can not come in for appointment to the office within 4 hours to go to [facility] or ED.  Copied from CRM [id number]. Topic: Clinical - Red Word Triage >> Jul 23, 2023  8:03 AM Irine Seal wrote: [facility] that prompted transfer to Nurse Triage: fell and tripped on her front porch and hit her face. Painful swelling in nose and forehead,she did not go to the ED. Reason for Disposition  [1] Age over 64 years AND [2] swelling or bruise  Answer Assessment - Initial Assessment Questions 1. MECHANISM: "How did the injury happen?" For falls, ask: "What height did you fall from?" and "What surface did you fall against?"      Trip and fall on her front porch, patient thinks there was a nail or board up that she tripped on. She  states she thought she could catch herself but did not and fell face first.  2. ONSET: "When did the injury happen?" (Minutes or hours ago)      Last night around 1930pm.  3. NEUROLOGIC SYMPTOMS: "Was there any loss of consciousness?" "Are there any other neurological symptoms?"      Denies.   4. MENTAL STATUS: "Does the person know who they are, who you are, and where they are?"      Alert and oriented x 3.  5. LOCATION: "What part of the head was hit?"      Forehead.  6. SCALP APPEARANCE: "What does the scalp look like? Is it bleeding now?" If Yes, ask: "Is it difficult to stop?"      Patient states yes it was bleeding then when asked if she was able to stop it she states no it was not bleeding but her nose was bleeding.  7. SIZE: For cuts, bruises, or swelling, ask: "How large is it?" (e.g., inches or centimeters)      Swelling on forehead (unable to describe); redness to inner corners of her eyes, swelling to nose.  8. PAIN: "Is there any pain?" If Yes, ask: "How bad is it?"  (e.g., Scale 1-10; or mild, moderate, severe)     Right arm and shoulder pain moderate.  9. TETANUS: For any breaks in the skin, ask: "When was the last tetanus booster?"     Unsure.  10. OTHER SYMPTOMS: "Do you have any other symptoms?" (e.g.,  neck pain, vomiting)       Denies.  Protocols used: Head Injury-A-AH

## 2023-07-23 NOTE — Progress Notes (Signed)
Subjective:     Patient ID: Beth Shepard, female    DOB: 12/03/1955, 68 y.o.   MRN: [id number]  Chief Complaint  Patient presents with   Fall    Fall occurred last night, twice. Pt states bruising on face and arm. Pt states unable to lift left arm. Pt states having weakness last night     Discussed the use of AI scribe software for clinical note transcription with the patient, who gave verbal consent to proceed.  History of Present Illness    The patient presents with injuries following a fall. She is accompanied by her daughter.  She tripped on the porch while attempting to retrieve her mail last night around 7:30 to 7:45 PM. The fall was described as a 'smack one' striking her midline chin/forehead and nose. She she was unable to get up immediately, remaining outside until after 8:00 PM. She eventually managed to get up by scooting to the steps and using the banister for support.  During the fall, her daughter reports that she had epistaxis and her clothes "were covered in blood."  She experienced a headache and some blurriness in her vision last night, which improved after taking a 'goody powder'. No severe headaches or changes in vision today. She did not lose consciousness before or after the fall.   She reports soreness in her right arm, particularly with abduction.          Health Maintenance Due  Topic Date Due   Medicare Annual Wellness (AWV)  Never done   Zoster Vaccines- Shingrix (1 of 2) Never done   DEXA SCAN  Never done   MAMMOGRAM  12/14/2018   Pneumonia Vaccine 24+ Years old (3 of 3 - PPSV23 or PCV20) 03/25/2020   OPHTHALMOLOGY EXAM  08/24/2022   DTaP/Tdap/Td (2 - Td or Tdap) 11/14/2022   INFLUENZA VACCINE  01/24/2023   COVID-19 Vaccine (3 - 2024-25 season) 02/24/2023   HEMOGLOBIN A1C  06/30/2023    Past Medical History:  Diagnosis Date   Anxiety    Bilateral edema of lower extremity    Swelling in legs and ankles   Congestive heart failure (HCC)     COPD (chronic obstructive pulmonary disease) (HCC)    Diabetes mellitus    type 2   Gastric polyps    GERD (gastroesophageal reflux disease)    chronic   Hyperlipidemia    Hypertension    IBS (irritable bowel syndrome)    LBBB (left bundle branch block)    Dr. Josefa Half, [facility]   Pneumonia    hx of   PONV (postoperative nausea and vomiting)    Had N/V after having tubal ligation and D&C   Rheumatoid arthritis (HCC)    Shingles     Past Surgical History:  Procedure Laterality Date   CARDIAC CATHETERIZATION     No PCI   CHOLECYSTECTOMY     COLONOSCOPY  06/06/2005   normal    DILATION AND CURETTAGE OF UTERUS     ESOPHAGOGASTRODUODENOSCOPY  06/06/2005, 11/17/2013   JOINT REPLACEMENT Right 10/24/2015   rt hip replacement   KNEE ARTHROSCOPY Left    TOTAL HIP ARTHROPLASTY Right 10/24/2015   Procedure: RIGHT TOTAL HIP ARTHROPLASTY ANTERIOR APPROACH;  Surgeon: Tarry Kos, MD;  Location: [facility] OR;  Service: Orthopedics;  Laterality: Right;   TUBAL LIGATION     VAGINAL HYSTERECTOMY      Family History  Problem Relation Age of Onset   Lymphoma Mother    Heart  disease Mother        chf, cabg x6   Hypertension Mother    GER disease Mother    Hiatal hernia Mother    Gallstones Mother    Rheum arthritis Father    Dementia Father    Brain cancer Sister        brain stage 4   Heart disease Maternal Grandmother 71       MI   Heart disease Maternal Grandfather    Stroke Paternal Grandmother    Hypertension Paternal Grandmother    Stroke Paternal Grandfather    Hypertension Paternal Grandfather    Hypertension Daughter    Colon cancer Neg Hx    Esophageal cancer Neg Hx    Pancreatic cancer Neg Hx    Liver disease Neg Hx    Kidney disease Neg Hx    Stomach cancer Neg Hx    Rectal cancer Neg Hx     Social History   Socioeconomic History   Marital status: Widowed    Spouse name: Not on file   Number of children: 2   Years of education: Not on file    Highest education level: Not on file  Occupational History   Occupation: Administive - retired    Associate Professor: [employer]  Tobacco Use   Smoking status: Former    Current packs/day: 0.00    Types: Cigarettes    Start date: 06/25/1969    Quit date: 06/25/2002    Years since quitting: 21.0   Smokeless tobacco: Never  Vaping Use   Vaping status: Never Used  Substance and Sexual Activity   Alcohol use: Yes    Comment: rare   Drug use: No   Sexual activity: Not Currently    Partners: Male  Other Topics Concern   Not on file  Social History Narrative   Limited exercise per cardiology   Right handed   Caffeine-2 cups daily   Lives alone, still driving, has gotten lost 1 time   Social Drivers of Corporate investment banker Strain: Low Risk  (11/07/2021)   Overall Financial Resource Strain (CARDIA)    Difficulty of Paying Living Expenses: Not very hard  Food Insecurity: Not on file  Transportation Needs: No Transportation Needs (01/17/2022)   PRAPARE - Administrator, Civil Service (Medical): No    Lack of Transportation (Non-Medical): No  Physical Activity: Insufficiently Active (02/14/2023)   Exercise Vital Sign    Days of Exercise per Week: 3 days    Minutes of Exercise per Session: 10 min  Stress: Not on file  Social Connections: Not on file  Intimate Partner Violence: Not on file    Outpatient Medications Prior to Visit  Medication Sig Dispense Refill   ALPRAZolam (XANAX) 0.25 MG tablet Take 1 tablet (0.25 mg total) by mouth 3 (three) times daily as needed. 90 tablet 0   aspirin 81 MG tablet Take 81 mg by mouth daily.     blood glucose meter kit and supplies KIT Dispense based on patient and insurance preference. Use up to four times daily as directed. 1 each 0   Calcium Carb-Cholecalciferol (CALCIUM-VITAMIN D) 500-200 MG-UNIT tablet Take 1 tablet by mouth daily.     furosemide (LASIX) 20 MG tablet Take 1 tablet (20 mg total) by mouth daily. 90 tablet 0    pantoprazole (PROTONIX) 40 MG tablet Take 1 tablet (40 mg total) by mouth 2 (two) times daily. Take twice daily for 8 weeks then daily  90 tablet 3   rosuvastatin (CRESTOR) 10 MG tablet Take 1 tablet (10 mg total) by mouth daily. 30 tablet 0   Semaglutide,0.25 or 0.5MG /DOS, (OZEMPIC, 0.25 OR 0.5 MG/DOSE,) 2 MG/3ML SOPN INJECT 0.5 MG INTO THE SKIN ONE TIME PER WEEK (Patient taking differently: Inject 0.5 mg into the skin every 14 (fourteen) days.) 9 mL 1   spironolactone (ALDACTONE) 25 MG tablet Take 1 tablet (25 mg total) by mouth every other day. 45 tablet 0   Facility-Administered Medications Prior to Visit  Medication Dose Route Frequency Provider Last Rate Last Admin   0.9 %  sodium chloride infusion  500 mL Intravenous Once Imogene Burn, MD        Allergies  Allergen Reactions   Prednisone Palpitations and Other (See Comments)    Dose pack causes vomiting; Causes Genella Rife N/v/d N/v/d    Codeine Nausea Only and Palpitations    REACTION: palpitation  REACTION: palpitation     Ibuprofen Other (See Comments) and Rash    REACTION: questionable. Pt states that she can take this sometimes. GERD    ROS See HPI    Objective:    Physical Exam Constitutional:      General: She is not in acute distress.    Appearance: Normal appearance. She is well-developed.  HENT:     Head: Normocephalic and atraumatic.     Comments: Bruising and swelling across the bridge of her nose Bruising noted beneath both eyes    Right Ear: Tympanic membrane, ear canal and external ear normal.     Left Ear: Tympanic membrane and external ear normal.     Mouth/Throat:     Mouth: Mucous membranes are moist.     Palate: No mass.     Pharynx: No pharyngeal swelling or posterior oropharyngeal erythema.     Comments: edentulous Eyes:     General: No scleral icterus.    Extraocular Movements: Extraocular movements intact.     Pupils: Pupils are equal, round, and reactive to light.  Neck:     Thyroid: No  thyromegaly.  Cardiovascular:     Rate and Rhythm: Normal rate and regular rhythm.     Heart sounds: Normal heart sounds. No murmur heard. Pulmonary:     Effort: Pulmonary effort is normal. No respiratory distress.     Breath sounds: Normal breath sounds. No wheezing.  Musculoskeletal:        General: No swelling.     Cervical back: Neck supple.     Comments: Decreased ROM of right shoulder Increased shoulder pain with flexion of RUE And abduction  Skin:    General: Skin is warm and dry.  Neurological:     General: No focal deficit present.     Mental Status: She is alert and oriented to person, place, and time.     Cranial Nerves: No cranial nerve deficit.  Psychiatric:        Mood and Affect: Mood normal.        Behavior: Behavior normal.        Thought Content: Thought content normal.        Judgment: Judgment normal.      BP 100/70 (BP Location: Left Arm, Patient Position: Sitting, Cuff Size: Normal)   Pulse 82   Temp 97.6 F (36.4 C) (Oral)   Resp 18   Ht 5\' 1"  (1.549 m)   Wt 149 lb 3.2 oz (67.7 kg)   SpO2 98%   BMI 28.19 kg/m  Wt Readings  from Last 3 Encounters:  07/23/23 149 lb 3.2 oz (67.7 kg)  05/15/23 154 lb (69.9 kg)  04/22/23 154 lb (69.9 kg)       Assessment & Plan:   Problem List Items Addressed This Visit       Unprioritized   Traumatic injury of head - Primary   New. Need to rule out fracture of orbits and intracranial bleed.  No neuro deficits.  I will also order labs to help exclude underlying medical issue contributing to falls.        Relevant Orders   CT HEAD WO CONTRAST ( )   DG Orbits   Comp Met (CMET) (Completed)   CBC w/Diff (Completed)   Urine Culture   Acute pain of right shoulder   Relevant Orders   DG Shoulder Right    I am having [place] "Chip Boer" maintain her aspirin, calcium-vitamin D, blood glucose meter kit and supplies, furosemide, Ozempic (0.25 or 0.5 MG/DOSE), pantoprazole, ALPRAZolam, spironolactone, and  rosuvastatin. We will continue to administer sodium chloride.  No orders of the defined types were placed in this encounter.

## 2023-07-23 NOTE — Assessment & Plan Note (Signed)
New. Need to rule out fracture of orbits and intracranial bleed.  No neuro deficits.  I will also order labs to help exclude underlying medical issue contributing to falls.

## 2023-07-23 NOTE — Telephone Encounter (Signed)
FYI. Pt has appt with Efraim Kaufmann today

## 2023-07-24 ENCOUNTER — Telehealth: Payer: Self-pay | Admitting: Family

## 2023-07-24 DIAGNOSIS — M25511 Pain in right shoulder: Secondary | ICD-10-CM

## 2023-07-24 LAB — URINE CULTURE
MICRO NUMBER:: 16007767
Result:: NO GROWTH
SPECIMEN QUALITY:: ADEQUATE

## 2023-07-24 NOTE — Telephone Encounter (Signed)
Results explained in detail to patient's daughter, she reports no complains about trouble breathing and agrees with referral.

## 2023-07-24 NOTE — Telephone Encounter (Addendum)
Please advise pt that there is no bleed in her brain or fracture around her eyes.    It does appear that she broke her nose though there is not much we can do for that unfortunately as long as she is breathing ok out of her left nostril. Any issues breathing out of her nose?  There is no obvious fracture in her right shoulder, but I would like to refer her sports medicine so that the can check her for rotator cuff tear that would not show up on x-ray.   Potassium is a little low. Please focus on increasing potassium rich foods.   High potassium content foods  Highest content (>25 meq/100 g) High content (>6.2 meq/100 g)   Vegetables   Spinach   Tomatoes   Broccoli   Winter squash   Beets   Carrots   Cauliflower   Potatoes   Fruits   Bananas  Dried figs Cantaloupe  Molasses Kiwis  Seaweed Oranges  Very high content (>12.5 meq/100 g) Mangos  Dried fruits (dates, prunes) Meats  Nuts Ground beef  Avocados Steak  Bran cereals Pork  Wheat germ Veal  Lima beans Randa Lynn

## 2023-07-25 ENCOUNTER — Ambulatory Visit: Payer: Medicare HMO | Admitting: Podiatry

## 2023-07-25 ENCOUNTER — Encounter: Payer: Self-pay | Admitting: Family

## 2023-07-29 ENCOUNTER — Other Ambulatory Visit: Payer: Self-pay | Admitting: Family Medicine

## 2023-07-29 DIAGNOSIS — E785 Hyperlipidemia, unspecified: Secondary | ICD-10-CM

## 2023-07-30 ENCOUNTER — Ambulatory Visit: Payer: Medicare HMO | Attending: Neurology | Admitting: Speech Pathology

## 2023-07-30 DIAGNOSIS — R4701 Aphasia: Secondary | ICD-10-CM | POA: Diagnosis not present

## 2023-07-30 NOTE — Therapy (Signed)
 OUTPATIENT SPEECH LANGUAGE PATHOLOGY TREATMENT NOTE   Patient Name: Beth Shepard MRN: [id number] DOB:03-27-1956, 68 y.o., female Today's Date: 07/30/2023  PCP: Antonio Cyndee Jamee JONELLE, DO REFERRING PROVIDER: Gregg Lek, MD  END OF SESSION:  End of Session - 07/30/23 1549     Visit Number 5    Number of Visits 25    Date for SLP Re-Evaluation 08/21/23    SLP Start Time 1443    SLP Stop Time  1535    SLP Time Calculation (min) 52 min    Activity Tolerance Patient tolerated treatment well            Past Medical History:  Diagnosis Date   Anxiety    Bilateral edema of lower extremity    Swelling in legs and ankles   Congestive heart failure (HCC)    COPD (chronic obstructive pulmonary disease) (HCC)    Diabetes mellitus    type 2   Gastric polyps    GERD (gastroesophageal reflux disease)    chronic   Hyperlipidemia    Hypertension    IBS (irritable bowel syndrome)    LBBB (left bundle branch block)    Dr. Shermon Counter, [facility]   Pneumonia    hx of   PONV (postoperative nausea and vomiting)    Had N/V after having tubal ligation and D&C   Rheumatoid arthritis (HCC)    Shingles    Past Surgical History:  Procedure Laterality Date   CARDIAC CATHETERIZATION     No PCI   CHOLECYSTECTOMY     COLONOSCOPY  06/06/2005   normal    DILATION AND CURETTAGE OF UTERUS     ESOPHAGOGASTRODUODENOSCOPY  06/06/2005, 11/17/2013   JOINT REPLACEMENT Right 10/24/2015   rt hip replacement   KNEE ARTHROSCOPY Left    TOTAL HIP ARTHROPLASTY Right 10/24/2015   Procedure: RIGHT TOTAL HIP ARTHROPLASTY ANTERIOR APPROACH;  Surgeon: Kay CHRISTELLA Cummins, MD;  Location: [facility] OR;  Service: Orthopedics;  Laterality: Right;   TUBAL LIGATION     VAGINAL HYSTERECTOMY     Patient Active Problem List   Diagnosis Date Noted   Traumatic injury of head 07/23/2023   Acute pain of right shoulder 07/23/2023   Rheumatoid arthritis involving multiple joints (HCC) 12/22/2021   Type 2 diabetes  mellitus with diabetic peripheral angiopathy without gangrene, without long-term current use of insulin  (HCC) 12/22/2021   Hyperlipidemia 09/18/2021   Memory loss 09/08/2021   Thrush 09/08/2021   IBS (irritable bowel syndrome) 05/11/2020   Uncontrolled type 2 diabetes mellitus with hyperglycemia (HCC) 02/01/2020   Nausea without vomiting 01/27/2019   Preventative health care 12/18/2017   Infected sebaceous cyst of skin 07/19/2017   DCM (dilated cardiomyopathy) (HCC) 07/10/2017   Fibromyalgia 04/05/2017   Chronic pain syndrome 04/05/2017   Pain in both lower extremities 02/06/2017   Chronic venous insufficiency 02/06/2017   Cellulitis of lower extremity 08/28/2016   Pain in right ankle and joints of right foot 08/09/2016   Peripheral edema 07/10/2016   Trochanteric bursitis, right hip 05/03/2016   Chronic kidney disease (CKD) stage G3a/A1, moderately decreased glomerular filtration rate (GFR) between 45-59 mL/min/1.73 square meter and albuminuria creatinine ratio less than 30 mg/g (HCC) 04/07/2016   Primary osteoarthritis of right hip 10/24/2015   Hip joint replacement status 10/24/2015   Chest pain 08/25/2015   Right hip pain 08/25/2015   Chest pain, precordial 08/10/2015   Precordial pain 08/10/2015   Cardiomyopathy (HCC) 01/18/2015   Block, bundle branch, left 01/18/2015   Left  bundle-branch block 01/18/2015   Diabetes (HCC) 01/18/2015   Nausea alone 12/17/2013   Alternating constipation and diarrhea 11/19/2013   Bloating 11/19/2013   Obesity (BMI 30-39.9) 10/21/2013   Multiple joint pain 11/13/2012   MYALGIA 08/29/2010   ABDOMINAL PAIN OTHER SPECIFIED SITE 08/22/2009   Osteoarthritis 03/17/2009   Cellulitis and abscess of face 12/17/2008   Disturbance of skin sensation 12/17/2008   Bronchitis 05/07/2008   Anxiety 12/04/2007   SINUSITIS, ACUTE NOS 12/19/2006   Diabetes mellitus, type II (HCC) 10/08/2006   Hyperlipidemia LDL goal <70 10/08/2006   Essential hypertension  10/08/2006   COPD (chronic obstructive pulmonary disease) (HCC) 10/08/2006   GERD 10/08/2006   Other postprocedural status(V45.89) 10/08/2006    ONSET DATE: 04/03/2023 (referral date)  REFERRING DIAG: R47.89 (ICD-10-CM) - Word finding difficulty  THERAPY DIAG:  No diagnosis found.  Rationale for Evaluation and Treatment: Rehabilitation  SUBJECTIVE:   SUBJECTIVE STATEMENT: Pt reports compliance with HEP, reported still experiencing some difficulty remembering items/tasks when leaving the house.  PERTINENT HISTORY: Per Neuro consult 04/03/23: This is 68 year old woman past medical history of hypertension, hyperlipidemia, diabetes mellitus, heart disease who is presenting with complaint of memory loss.  Basically patient describes the memory loss as word finding difficulty.  She said during the conversation, she knows what she wants to say but the words are just not coming out.  At times, this will make her frustrated.  Patient tells me that the symptoms have worsened since being told that her A1c was elevated at 13 about a year ago. Daughter tells me that patient does have word finding difficulty, she does have a problem with numbers and writing checks.  Sometimes she will ask how to spell simple words or how to do simple math.  She lives alone, independent in all activities of daily living,Patient is independent in all activities of daily living, normal MMSE.  Informed patient that this is likely normal cognition.  However for her word finding difficulty, I am going to send her to speech therapy.  Advised her to continue following up with her PCP   PAIN:  Are you having pain? No  FALLS: Has patient fallen in last 6 months?  No  LIVING ENVIRONMENT: Lives with: lives alone Lives in: House/apartment  PLOF:  Level of assistance: Independent with ADLs, Needed assistance with IADLS Employment: Retired  PATIENT GOALS: To be able to talk and write better  OBJECTIVE:  Note: Objective  measures were completed at Evaluation unless otherwise noted.  TODAY'S TREATMENT:      07/30/23:  Target naming and anomia compensation of description through [employer]. SLP provided education on use of SFA as rehabilitation tool to improve lexical representation with improved word finding, use as structure to aid in description in conversation, and as self-cueing strategy. Modeled how SFA is completed with simple object flower. During structured practice pt able to generate 4/6 salient features given min-A. Pt benefited from questioning, semantic, and phonemic cues. Utilized during conversation resulting in x2 instances of overcoming anomia. X1 instance of unsuccessful use for flower name, likely d/t uniqueness of word and limited lexical connections.   07/16/23: Pt reported successful use of strategies at home and noted improvements remembering important items but shared she occasionally forgets her keys, bag, etc.when leaving the house. ST reviewed memory strategies and developed a visual aid to aid pt's difficulty with memory mistakes/concerns. ST implemented both semantic feature analysis (SFA) and phonological feature analysis (PFA) to target anomia. Pt required max-A during  word finding tasks. ST noted pt was prone to mazing, communicated using vague language, and produced some phonemic paraphasias during unstructured conversation. ST plans to continue targeting word finding deficits in future sessions and modify pt's phone to support her ability to easily contact and communicate with family/friends.   07/09/23: Pt reports daughters report she has trouble talking and with memory. She feels like she only has communication challenges when speaking to them. Unable to relay memory concerns, memory mistakes questionnaire completed. Concerns noted regarding paying bills on time, leaving things when meant to bring with, forget to take medication, forget important date, forget details in  conversation. Given generative naming task, pt generates x11 items per x1 category with no cues required. Speech this date overall fluent, occasional anomia.   06/11/23: Pt presenting with fluent and appropriate speech in conversation this date. No paraphasias evidenced. X2 instances of anomia in which pt utilizing circumlocution to aid in communication recovery. SLP broached creation of low the AAC and memory book, pt appears resistant. Tells SLP she has not trouble speaking to herself, it;s only when speaking to other people. Will require ongoing education for awareness/acceptance of deficits and employment of strategies to support employment of augmentative communication. SLP led pt through generative naming task with preferred topic of travel. Pt generates list of x8 places she has visited, recovery achieved for additional x2 with cued use of description and use of questioning cues.   05/29/23 (eval day): Reviewed goals, provided list of activities to facilitate language at home (see Patient Instructions) Aphasia education re: all areas of language are affected.  Discussed neuropsych consult to r/o PPA.. They cancelled this appointment but may want to make a new appointment.   PATIENT EDUCATION: Education details: See Today's Treatment; See Patient Instructions; compensations for aphasia; aphasia ed Person educated: Patient and Child(ren) Education method: Explanation, Demonstration, Verbal cues, and Handouts Education comprehension: verbal cues required and needs further education   GOALS: Goals reviewed with patient? Yes  SHORT TERM GOALS: Target date: 06/26/23  Pt will name 12 items in personally relevant category with rare min A Baseline: Goal status: IN PROGRESS  2.  Pt will use verbal compensations for aphasia in structured naming task 4/5 opportunities with occasional min A Baseline:  Goal status: IN PROGRESS  3.  Pt/family will generate personally relevant words list to support  word finding as low tech AAC Baseline:  Goal status: IN PROGRESS  4.  Pt will complete simple mental money/time math with 80% accuracy with occasional min A Baseline:  Goal status: IN PROGRESS  5.  Pt and family will carryover 3 strategies to support auditory comprehension of moderately complex information Baseline:  Goal status: IN PROGRESS   LONG TERM GOALS: Target date: 08/21/23  Pt will use verbal compensations for aphasia in conversation as needed 4/5 opportunities with rare min A Baseline:  Goal status: IN PROGRESS  2.  Pt will carryover 4 strategies to support auditory comprehension of moderately complex information Baseline:  Goal status: IN PROGRESS  3.  Pt will use low tech AAC and multimodal communication to support word finding as needed with occasional min A Baseline:  Goal status: IN PROGRESS  4. Pt will self advocate re: her communication disorder at 3 community venues to improve communicative interactions in the community Baseline:  Goal status: IN PROGRESS   ASSESSMENT:  CLINICAL IMPRESSION: Patient is a 68 y.o. female who was seen today for mild aphasia. Pt and family report this began about 2 years  ago and has gotten worse. She was pushed into early retirement possibly due to mistakes at work at that time. She reports difficulty writing simple words and word finding. Auditory comprehension is also affected. They relay an incident where she didn't understand the bank teller and ended up opening a new account that she didn't intend to due to communication breakdown. Aphasic errors in sentence writing (radow/radio; spead/speak; unable to spell night. She endorses some difficulty reading complex materials such as her bills and insurance information. Family is helping with this now. They are also helping her write checks due to difficulty with numbers and spelling. Orie reports inconsistent word finding difficulties for simple words. Orie is successfully managing  her medications and appointments. She is driving short distances and managing basic house hold tasks. Etiology - consider neuropsych eval to r/o PPA. MMSE was WNL at neurology visit. I recommend skilled ST to maximize communication for independence, safety and to reduce caregiver burden. .   OBJECTIVE IMPAIRMENTS: include aphasia. These impairments are limiting patient from managing appointments, managing finances, ADLs/IADLs, and effectively communicating at home and in community. Factors affecting potential to achieve goals and functional outcome are financial resources. Patient will benefit from skilled SLP services to address above impairments and improve overall function.  REHAB POTENTIAL: Good  PLAN:  SLP FREQUENCY: 2x/week - family is electing 1x a week due to co-pay  SLP DURATION: 12 weeks  PLANNED INTERVENTIONS: 92507 Treatment of speech (30 or 45 min) , Language facilitation, Environmental controls, Cueing hierachy, Cognitive reorganization, Internal/external aids, Functional tasks, Multimodal communication approach, SLP instruction and feedback, Compensatory strategies, and Patient/family education    [place], Student-SLP 07/30/2023, 7:46 AM

## 2023-07-31 ENCOUNTER — Ambulatory Visit: Payer: Medicare HMO | Admitting: Podiatry

## 2023-07-31 DIAGNOSIS — L84 Corns and callosities: Secondary | ICD-10-CM | POA: Diagnosis not present

## 2023-07-31 DIAGNOSIS — B351 Tinea unguium: Secondary | ICD-10-CM

## 2023-07-31 DIAGNOSIS — E1151 Type 2 diabetes mellitus with diabetic peripheral angiopathy without gangrene: Secondary | ICD-10-CM | POA: Diagnosis not present

## 2023-07-31 DIAGNOSIS — M79675 Pain in left toe(s): Secondary | ICD-10-CM | POA: Diagnosis not present

## 2023-07-31 DIAGNOSIS — M79674 Pain in right toe(s): Secondary | ICD-10-CM | POA: Diagnosis not present

## 2023-07-31 NOTE — Progress Notes (Signed)
    Subjective:  Patient ID: Beth Shepard, female    DOB: 07-13-1955,  MRN: [id number]  Beth Shepard presents to clinic today for:  Chief Complaint  Patient presents with   [facility]     [facility] with callous. Last A1c 5.9, Takes ASA 81. Last seen PCP Last week on Tuesday.    Patient notes nails are thick, discolored, elongated and painful in shoegear when trying to ambulate.  Patient has calluses on right heel and right submet 1.  Patient recently had a fall and has significant fading bruising on her face.  She also might have torn her rotator cuff and has an appointment with Ortho next week.  PCP is Antonio Meth, [place] R, DO.  Last seen around 07/23/2023  Allergies  Allergen Reactions   Prednisone  Palpitations and Other (See Comments)    Dose pack causes vomiting; Causes Thalia N/v/d N/v/d    Codeine Nausea Only and Palpitations    REACTION: palpitation  REACTION: palpitation     Ibuprofen Other (See Comments) and Rash    REACTION: questionable. Pt states that she can take this sometimes. GERD    Review of Systems: Negative except as noted in the HPI.  Objective:  [name] is a pleasant 68 y.o. female in NAD. AAO x 3.  Vascular Examination: Capillary refill time is delayed to toes bilateral. Palpable DP, nonpalpable PT pulses b/l LE. Digital hair absent b/l. No pedal edema b/l. Skin temperature gradient WNL b/l. No varicosities b/l. No cyanosis or clubbing noted b/l.   Dermatological Examination: Pedal skin with decreased turgor, texture and tone b/l.  Skin is shiny and atrophic.  No open wounds. No interdigital macerations b/l. Toenails x10 are 3 mm thick, discolored, dystrophic with subungual debris. There is pain with compression of the nail plates.  They are severely elongated x10.  There are hyperkeratotic lesions right submet 1 and right plantar heel.     Latest Ref Rng & Units 12/28/2022    3:46 PM  Hemoglobin A1C  Hemoglobin-A1c <5.7 % of total Hgb 5.9     Assessment/Plan: 1. Pain due to onychomycosis of toenails of both feet   2. Callus of foot   3. Type II diabetes mellitus with peripheral circulatory disorder (HCC)    The mycotic/gryphotic toenails were sharply debrided x10 with sterile nail nippers and a power debriding burr to decrease bulk/thickness and length.    The hyperkeratotic lesions on the right plantar heel and right submet 1 were shaved with a sterile #313 blade.  Return in about 3 months (around 10/28/2023) for [facility].   Awanda CHARM Imperial, DPM, FACFAS [facility]     [address] [place], [place] [address]                Office [phone]  Fax [phone]

## 2023-08-01 ENCOUNTER — Ambulatory Visit
Admission: RE | Admit: 2023-08-01 | Discharge: 2023-08-01 | Disposition: A | Discharge status: Home / Self Care | Payer: Medicare HMO | Source: Ambulatory Visit | Attending: Internal Medicine | Admitting: Internal Medicine

## 2023-08-01 DIAGNOSIS — R918 Other nonspecific abnormal finding of lung field: Secondary | ICD-10-CM

## 2023-08-01 DIAGNOSIS — R911 Solitary pulmonary nodule: Secondary | ICD-10-CM | POA: Diagnosis not present

## 2023-08-01 DIAGNOSIS — R935 Abnormal findings on diagnostic imaging of other abdominal regions, including retroperitoneum: Secondary | ICD-10-CM

## 2023-08-01 DIAGNOSIS — J984 Other disorders of lung: Secondary | ICD-10-CM | POA: Diagnosis not present

## 2023-08-05 DIAGNOSIS — I420 Dilated cardiomyopathy: Secondary | ICD-10-CM | POA: Diagnosis not present

## 2023-08-05 DIAGNOSIS — E1151 Type 2 diabetes mellitus with diabetic peripheral angiopathy without gangrene: Secondary | ICD-10-CM | POA: Diagnosis not present

## 2023-08-05 DIAGNOSIS — I447 Left bundle-branch block, unspecified: Secondary | ICD-10-CM | POA: Diagnosis not present

## 2023-08-05 DIAGNOSIS — E1169 Type 2 diabetes mellitus with other specified complication: Secondary | ICD-10-CM | POA: Diagnosis not present

## 2023-08-05 DIAGNOSIS — E785 Hyperlipidemia, unspecified: Secondary | ICD-10-CM | POA: Diagnosis not present

## 2023-08-05 DIAGNOSIS — I10 Essential (primary) hypertension: Secondary | ICD-10-CM | POA: Diagnosis not present

## 2023-08-06 NOTE — Progress Notes (Signed)
 Hi Beth Shepard, please let the patient know that her chest CT showed some potential residual inflammation in the right lower lobe of the lung and a lung nodule in the right upper lobe. Our radiologists are recommending she get a follow up chest CT in 6 months that she can get done with her PCP. Will CC her PCP Dr. Antonio Meth to this result.   Interestingly, the CT also showed a prominent common bile duct with a possibility of choledocholithiasis. Thus I would recommend a MRI/MRCP w/contrast for further evaluation of this if she is agreeable.

## 2023-08-07 ENCOUNTER — Ambulatory Visit: Payer: Medicare HMO | Admitting: Family Medicine

## 2023-08-08 ENCOUNTER — Other Ambulatory Visit: Payer: Self-pay | Admitting: Family Medicine

## 2023-08-08 ENCOUNTER — Other Ambulatory Visit: Payer: Self-pay

## 2023-08-08 ENCOUNTER — Ambulatory Visit: Payer: Medicare HMO | Admitting: Family Medicine

## 2023-08-08 VITALS — BP 100/60 | HR 78 | Ht 61.0 in

## 2023-08-08 DIAGNOSIS — R911 Solitary pulmonary nodule: Secondary | ICD-10-CM

## 2023-08-08 DIAGNOSIS — M12811 Other specific arthropathies, not elsewhere classified, right shoulder: Secondary | ICD-10-CM | POA: Diagnosis not present

## 2023-08-08 DIAGNOSIS — R112 Nausea with vomiting, unspecified: Secondary | ICD-10-CM

## 2023-08-08 DIAGNOSIS — M25511 Pain in right shoulder: Secondary | ICD-10-CM | POA: Diagnosis not present

## 2023-08-08 DIAGNOSIS — R935 Abnormal findings on diagnostic imaging of other abdominal regions, including retroperitoneum: Secondary | ICD-10-CM

## 2023-08-08 DIAGNOSIS — M75101 Unspecified rotator cuff tear or rupture of right shoulder, not specified as traumatic: Secondary | ICD-10-CM

## 2023-08-08 MED ORDER — CELECOXIB 100 MG PO CAPS: 100.0000 mg | ORAL_CAPSULE | Freq: Two times a day (BID) | ORAL | 0 refills | Status: AC

## 2023-08-08 NOTE — Progress Notes (Signed)
 CHIEF COMPLAINT: No chief complaint on file.  _____________________________________________________________ SUBJECTIVE  HPI  Pt is a 68 y.o. female here for evaluation of Right shoulder pain Seen PCP 07/23/2023, fell twice a night prior to this visit, tripped on porch while attempting to retrieve mail, first fall struck her midline chin/forehead and nose.  Also with soreness in right arm with abduction -Physical exam yielded decreased ROM right shoulder, and creased pain with flexion and abduction  Ongoing for 2 weeks Inciting event: patient fell, but has had chronic shoulder pain. Chart reviewed, has been previously seen by orthopedics for R shoulder pain. MRI had been ordered 2022, but was not completed by patient.   She states she has been told that she has no muscles in her arm  Also shares that she was supposed to have surgery on her shoulder, but had not been completed Primarily located  top of right shoulder, anterolateral  Pain severity is not as bad as when she had fallen 2 weeks ago Radiating no Numbness/tingling no Catching/locking no Exacerbated by reaching across body, picking things up  Therapies tried so far: heat, ice, Icy hot  Tylenol not help with pain, motrin doesn't help However shares that Ibuprofen can help, but will have stomach pain   ------------------------------------------------------------------------------------------------------ Past Medical History:  Diagnosis Date   Anxiety    Bilateral edema of lower extremity    Swelling in legs and ankles   Congestive heart failure (HCC)    COPD (chronic obstructive pulmonary disease) (HCC)    Diabetes mellitus    type 2   Gastric polyps    GERD (gastroesophageal reflux disease)    chronic   Hyperlipidemia    Hypertension    IBS (irritable bowel syndrome)    LBBB (left bundle branch block)    Dr. Josefa Half, [facility]   Pneumonia    hx of   PONV (postoperative nausea and vomiting)    Had  N/V after having tubal ligation and D&C   Rheumatoid arthritis (HCC)    Shingles     Past Surgical History:  Procedure Laterality Date   CARDIAC CATHETERIZATION     No PCI   CHOLECYSTECTOMY     COLONOSCOPY  06/06/2005   normal    DILATION AND CURETTAGE OF UTERUS     ESOPHAGOGASTRODUODENOSCOPY  06/06/2005, 11/17/2013   JOINT REPLACEMENT Right 10/24/2015   rt hip replacement   KNEE ARTHROSCOPY Left    TOTAL HIP ARTHROPLASTY Right 10/24/2015   Procedure: RIGHT TOTAL HIP ARTHROPLASTY ANTERIOR APPROACH;  Surgeon: Tarry Kos, MD;  Location: [facility] OR;  Service: Orthopedics;  Laterality: Right;   TUBAL LIGATION     VAGINAL HYSTERECTOMY        Outpatient Encounter Medications as of 08/08/2023  Medication Sig   ALPRAZolam (XANAX) 0.25 MG tablet Take 1 tablet (0.25 mg total) by mouth 3 (three) times daily as needed.   aspirin 81 MG tablet Take 81 mg by mouth daily.   blood glucose meter kit and supplies KIT Dispense based on patient and insurance preference. Use up to four times daily as directed.   Calcium Carb-Cholecalciferol (CALCIUM-VITAMIN D) 500-200 MG-UNIT tablet Take 1 tablet by mouth daily.   furosemide (LASIX) 20 MG tablet Take 1 tablet (20 mg total) by mouth daily.   pantoprazole (PROTONIX) 40 MG tablet Take 1 tablet (40 mg total) by mouth 2 (two) times daily. Take twice daily for 8 weeks then daily   rosuvastatin (CRESTOR) 10 MG tablet Take 1 tablet (10 mg  total) by mouth daily.   Semaglutide,0.25 or 0.5MG /DOS, (OZEMPIC, 0.25 OR 0.5 MG/DOSE,) 2 MG/3ML SOPN INJECT 0.5 MG INTO THE SKIN ONE TIME PER WEEK (Patient taking differently: Inject 0.5 mg into the skin every 14 (fourteen) days.)   spironolactone (ALDACTONE) 25 MG tablet Take 1 tablet (25 mg total) by mouth every other day.   Facility-Administered Encounter Medications as of 08/08/2023  Medication   0.9 %  sodium chloride infusion     ------------------------------------------------------------------------------------------------------  _____________________________________________________________ OBJECTIVE  PHYSICAL EXAM  Today's Vitals   08/08/23 1436  BP: 100/60  Pulse: 78  SpO2: 96%  Height: 5\' 1"  (1.549 m)   Body mass index is 28.19 kg/m.   reviewed  General: A+Ox3, no acute distress, well-nourished, appropriate affect CV: pulses 2+ regular, nondiaphoretic, no peripheral edema, cap refill <2sec Lungs: no audible wheezing, non-labored breathing, bilateral chest rise/fall, nontachypneic Skin: warm, well-perfused, non-icteric, no susp lesions or rashes Neuro: Right shoulder pain Sensation intact, muscle tone wnl, no atrophy Psych: no signs of depression or anxiety MSK:   R Shoulder:  No deformity, swelling or muscle wasting+ No scapular winging +dyskinesis FF 90, abd 30, symmetric ER TTP biceps, anterolateral humerus, subacromial space, lateral trap NTTP over the [place], clavicle, ac, coracoid, deltoid, scap spine, cervical spine + neer, +hawkins, +empty can, -scarf test, +hornblower, ER/IR weakness+pain, +subscap liftoff, equivocal speeds, obriens Neg ant drawer, sulcus sign Neg apprehension Negative Spurling's test bilat FROM of neck  06/2023 Right shoulder x-ray independently reviewed; revealing RTC tear OA, ACJ joint space narrowing, reduced SA spacing c/w previous RTC tear. r lateral rib deformity, age indeterminate  _____________________________________________________________ ASSESSMENT/PLAN Diagnoses and all orders for this visit:  Rotator cuff tear arthropathy of right shoulder  Acute pain of right shoulder  Other orders -     celecoxib (CELEBREX) 100 MG capsule; Take 1 capsule (100 mg total) by mouth 2 (two) times daily for 14 days. Take with food  Acute on chronic R shoulder pain following a fall ~ 2 weeks ago. Pain and ROM improving with conservative management, chronic pain with  films c/w prior RTC tear with now RTC tear arthropathy on XR. Fall anticipated to have exacerbated chronic symptoms, differential including acute muscle injury, RTC injury. Discussed options for management. She shares she is not interested in surgery at this time, citing she has been managing this shoulder pain for years now. Chip Boer declines SAB CSI/injection for pain management today, in favor of trialing a HEP at home instead and an alternate medication for pain; exercises provided today. Will trial celebrex, instructed to discontinue all other NSAIDs while taking medication; reviewed dosage and frequency, as well as indications to discontinue medication if having side effects. Encourage activity modification as tolerated, return to clinic if not recovering to chronic baseline ROM/symptoms. Patient plans to reach out for follow-up in 2-3 weeks for CSI/additional therapy if pain is not well managed, consider additional imaging to rule out fracture/alternate pathology. All questions answered. Return precautions discussed. Patient verbalized understanding and is in agreement with plan  Electronically signed by: Burna Forts, MD 08/08/2023 12:57 PM

## 2023-08-13 ENCOUNTER — Ambulatory Visit: Payer: Medicare HMO | Admitting: Speech Pathology

## 2023-08-13 DIAGNOSIS — R4701 Aphasia: Secondary | ICD-10-CM

## 2023-08-13 NOTE — Therapy (Unsigned)
 OUTPATIENT SPEECH LANGUAGE PATHOLOGY TREATMENT NOTE (RECERT)   Patient Name: Lyza Houseworth MRN: [id number] DOB:04/04/1956, 68 y.o., female Today's Date: 08/13/2023  PCP: Donato Schultz, DO REFERRING PROVIDER: Windell Norfolk, MD  END OF SESSION:  End of Session - 08/13/23 1526     Visit Number 6    Number of Visits 25    Date for SLP Re-Evaluation 09/18/23   +4 weeks for recert   Progress Note Due on Visit 10    SLP Start Time 1445    SLP Stop Time  1530    SLP Time Calculation (min) 45 min    Activity Tolerance Patient tolerated treatment well             Past Medical History:  Diagnosis Date   Anxiety    Bilateral edema of lower extremity    Swelling in legs and ankles   Congestive heart failure (HCC)    COPD (chronic obstructive pulmonary disease) (HCC)    Diabetes mellitus    type 2   Gastric polyps    GERD (gastroesophageal reflux disease)    chronic   Hyperlipidemia    Hypertension    IBS (irritable bowel syndrome)    LBBB (left bundle branch block)    Dr. Josefa Half, [facility]   Pneumonia    hx of   PONV (postoperative nausea and vomiting)    Had N/V after having tubal ligation and D&C   Rheumatoid arthritis (HCC)    Shingles    Past Surgical History:  Procedure Laterality Date   CARDIAC CATHETERIZATION     No PCI   CHOLECYSTECTOMY     COLONOSCOPY  06/06/2005   normal    DILATION AND CURETTAGE OF UTERUS     ESOPHAGOGASTRODUODENOSCOPY  06/06/2005, 11/17/2013   JOINT REPLACEMENT Right 10/24/2015   rt hip replacement   KNEE ARTHROSCOPY Left    TOTAL HIP ARTHROPLASTY Right 10/24/2015   Procedure: RIGHT TOTAL HIP ARTHROPLASTY ANTERIOR APPROACH;  Surgeon: Tarry Kos, MD;  Location: [facility] OR;  Service: Orthopedics;  Laterality: Right;   TUBAL LIGATION     VAGINAL HYSTERECTOMY     Patient Active Problem List   Diagnosis Date Noted   Traumatic injury of head 07/23/2023   Acute pain of right shoulder 07/23/2023   Rheumatoid  arthritis involving multiple joints (HCC) 12/22/2021   Type 2 diabetes mellitus with diabetic peripheral angiopathy without gangrene, without long-term current use of insulin (HCC) 12/22/2021   Hyperlipidemia 09/18/2021   Memory loss 09/08/2021   Thrush 09/08/2021   IBS (irritable bowel syndrome) 05/11/2020   Uncontrolled type 2 diabetes mellitus with hyperglycemia (HCC) 02/01/2020   Nausea without vomiting 01/27/2019   Preventative health care 12/18/2017   Infected sebaceous cyst of skin 07/19/2017   DCM (dilated cardiomyopathy) (HCC) 07/10/2017   Fibromyalgia 04/05/2017   Chronic pain syndrome 04/05/2017   Pain in both lower extremities 02/06/2017   Chronic venous insufficiency 02/06/2017   Cellulitis of lower extremity 08/28/2016   Pain in right ankle and joints of right foot 08/09/2016   Peripheral edema 07/10/2016   Trochanteric bursitis, right hip 05/03/2016   Chronic kidney disease (CKD) stage G3a/A1, moderately decreased glomerular filtration rate (GFR) between 45-59 mL/min/1.73 square meter and albuminuria creatinine ratio less than 30 mg/g (HCC) 04/07/2016   Primary osteoarthritis of right hip 10/24/2015   Hip joint replacement status 10/24/2015   Chest pain 08/25/2015   Right hip pain 08/25/2015   Chest pain, precordial 08/10/2015   Precordial pain  08/10/2015   Cardiomyopathy (HCC) 01/18/2015   Block, bundle branch, left 01/18/2015   Left bundle-branch block 01/18/2015   Diabetes (HCC) 01/18/2015   Nausea alone 12/17/2013   Alternating constipation and diarrhea 11/19/2013   Bloating 11/19/2013   Obesity (BMI 30-39.9) 10/21/2013   Multiple joint pain 11/13/2012   MYALGIA 08/29/2010   ABDOMINAL PAIN OTHER SPECIFIED SITE 08/22/2009   Osteoarthritis 03/17/2009   Cellulitis and abscess of face 12/17/2008   Disturbance of skin sensation 12/17/2008   Bronchitis 05/07/2008   Anxiety 12/04/2007   SINUSITIS, ACUTE NOS 12/19/2006   Diabetes mellitus, type II (HCC)  10/08/2006   Hyperlipidemia LDL goal <70 10/08/2006   Essential hypertension 10/08/2006   COPD (chronic obstructive pulmonary disease) (HCC) 10/08/2006   GERD 10/08/2006   Other postprocedural status(V45.89) 10/08/2006    ONSET DATE: 04/03/2023 (referral date)  REFERRING DIAG: R47.89 (ICD-10-CM) - Word finding difficulty  THERAPY DIAG:  Aphasia  Rationale for Evaluation and Treatment: Rehabilitation  SUBJECTIVE:   SUBJECTIVE STATEMENT: Pt reported feeling overwhelm due to many upcoming dr apt's, however would like to continue ST due to its benefit.  PERTINENT HISTORY: Per Neuro consult 04/03/23: This is 68 year old woman past medical history of hypertension, hyperlipidemia, diabetes mellitus, heart disease who is presenting with complaint of memory loss.  Basically patient describes the memory loss as word finding difficulty.  She said during the conversation, she knows what she wants to say but the words are just not coming out.  At times, this will make her frustrated.  Patient tells me that the symptoms have worsened since being told that her A1c was elevated at 13 about a year ago. Daughter tells me that patient does have word finding difficulty, she does have a problem with numbers and writing checks.  Sometimes she will ask how to spell simple words or how to do simple math.  She lives alone, independent in all activities of daily living,Patient is independent in all activities of daily living, normal MMSE.  Informed patient that this is likely normal cognition.  However for her word finding difficulty, I am going to send her to speech therapy.  Advised her to continue following up with her PCP   PAIN:  Are you having pain? No  FALLS: Has patient fallen in last 6 months?  No  LIVING ENVIRONMENT: Lives with: lives alone Lives in: House/apartment  PLOF:  Level of assistance: Independent with ADLs, Needed assistance with IADLS Employment: Retired  PATIENT GOALS: "To be able to  talk and write better"  OBJECTIVE:  Note: Objective measures were completed at Evaluation unless otherwise noted.  TODAY'S TREATMENT:      08/13/23: Pt reported continued success implementing memory strategies and shared feeling overwhelmed due to number of upcoming dr apt's but confirmed she would like to continue ST due to its benefit. ST reintroduced SFA and initiated a language task targeting anomia. Pt participated effectively with min/mod-A benefiting from question cues and phonemic cues to recall words during instances of anomia. Pt also utilized SFA strategies during unstructured conversation x2 to overcome word finding difficulties given min-A.   07/30/23:  Target naming and anomia compensation of description through [employer]. SLP provided education on use of SFA as rehabilitation tool to improve lexical representation with improved word finding, use as structure to aid in description in conversation, and as self-cueing strategy. Modeled how SFA is completed with simple object "flower." During structured practice pt able to generate 4/6 salient features given min-A. Pt benefited from  questioning, semantic, and phonemic cues. Utilized during conversation resulting in x2 instances of overcoming anomia. X1 instance of unsuccessful use for flower name, likely d/t uniqueness of word and limited lexical connections.   07/16/23: Pt reported successful use of strategies at home and noted improvements remembering important items but shared she occasionally forgets her keys, bag, etc.when leaving the house. ST reviewed memory strategies and developed a visual aid to aid pt's difficulty with memory mistakes/concerns. ST implemented both semantic feature analysis (SFA) and phonological feature analysis (PFA) to target anomia. Pt required max-A during word finding tasks. ST noted pt was prone to mazing, communicated using vague language, and produced some phonemic paraphasias during unstructured  conversation. ST plans to continue targeting word finding deficits in future sessions and modify pt's phone to support her ability to easily contact and communicate with family/friends.   07/09/23: Pt reports daughters report she has trouble talking and with memory. She feels like she only has communication challenges when speaking to them. Unable to relay memory concerns, memory mistakes questionnaire completed. Concerns noted regarding paying bills on time, leaving things when meant to bring with, forget to take medication, forget important date, forget details in conversation. Given generative naming task, pt generates x11 items per x1 category with no cues required. Speech this date overall fluent, occasional anomia.   06/11/23: Pt presenting with fluent and appropriate speech in conversation this date. No paraphasias evidenced. X2 instances of anomia in which pt utilizing circumlocution to aid in communication recovery. SLP broached creation of low the AAC and memory book, pt appears resistant. Tells SLP she has not trouble speaking to herself, it;s only when speaking to other people. Will require ongoing education for awareness/acceptance of deficits and employment of strategies to support employment of augmentative communication. SLP led pt through generative naming task with preferred topic of travel. Pt generates list of x8 places she has visited, recovery achieved for additional x2 with cued use of description and use of questioning cues.   05/29/23 (eval day): Reviewed goals, provided list of activities to facilitate language at home (see Patient Instructions) Aphasia education re: all areas of language are affected.  Discussed neuropsych consult to r/o PPA.. They cancelled this appointment but may want to make a new appointment.   PATIENT EDUCATION: Education details: See Today's Treatment; See Patient Instructions; compensations for aphasia; aphasia ed Person educated: Patient and  Child(ren) Education method: Explanation, Demonstration, Verbal cues, and Handouts Education comprehension: verbal cues required and needs further education   GOALS: Goals reviewed with patient? Yes  SHORT TERM GOALS: Target date: 09/10/2023 ?  (4 weeks out) - I was not positive if I should update the goals, or if there is any changes that need to be made due to the added appts ***    Pt will name 12 items in personally relevant category with rare min A Baseline: Goal status: NOT MET - IN PROGRESS  2.  Pt will use verbal compensations for aphasia in structured naming task 4/5 opportunities with occasional min A Baseline:  Goal status: MET  3.  Pt/family will generate personally relevant words list to support word finding as low tech AAC Baseline:  Goal status: NOT MET - INITIAL  4.  Pt will complete simple mental money/time math with 80% accuracy with occasional min A Baseline:  Goal status: NOT MET - INITIAL  5.  Pt and family will carryover 3 strategies to support auditory comprehension of moderately complex information Baseline:  Goal status: NOT MET -  INITIAL   LONG TERM GOALS: Target date: 09/18/23  Pt will use verbal compensations for aphasia in conversation as needed 4/5 opportunities with rare min A Baseline:  Goal status: IN PROGRESS  2.  Pt will carryover 4 strategies to support auditory comprehension of moderately complex information Baseline:  Goal status: IN PROGRESS  3.  Pt will use low tech AAC and multimodal communication to support word finding as needed with occasional min A Baseline:  Goal status: IN PROGRESS  4. Pt will self advocate re: her communication disorder at 3 community venues to improve communicative interactions in the community Baseline:  Goal status: IN PROGRESS   ASSESSMENT:  CLINICAL IMPRESSION:  Patient is a 68 y.o. female who was seen today for mild aphasia. Pt and family report this began about 2 years ago and has gotten worse.  She was "pushed into early retirement" possibly due to mistakes at work at that time. She reports difficulty writing simple words and word finding. Auditory comprehension is also affected. They relay an incident where she didn't understand the bank teller and ended up opening a new account that she didn't intend to due to communication breakdown. Aphasic errors in sentence writing (radow/radio; spead/speak; unable to spell "night." She endorses some difficulty reading complex materials such as her bills and insurance information. Family is helping with this now. They are also helping her write checks due to difficulty with numbers and spelling. Chip Boer reports inconsistent word finding difficulties for simple words. Pt reported upcoming MRI, ST to f/u regarding results. I recommend skilled ST to maximize communication for independence, safety and to reduce caregiver burden.   OBJECTIVE IMPAIRMENTS: include aphasia. These impairments are limiting patient from managing appointments, managing finances, ADLs/IADLs, and effectively communicating at home and in community. Factors affecting potential to achieve goals and functional outcome are financial resources. Patient will benefit from skilled SLP services to address above impairments and improve overall function.  REHAB POTENTIAL: Good  PLAN:  SLP FREQUENCY: 2x/week - family is electing 1x a week due to co-pay  SLP DURATION: 12 weeks + 4 weeks for recert   PLANNED INTERVENTIONS: 92507 Treatment of speech (30 or 45 min) , Language facilitation, Environmental controls, Cueing hierachy, Cognitive reorganization, Internal/external aids, Functional tasks, Multimodal communication approach, SLP instruction and feedback, Compensatory strategies, and Patient/family education  Maia Breslow, CCC-SLP 08/13/2023, 3:35 PM

## 2023-08-28 ENCOUNTER — Ambulatory Visit: Payer: Medicare HMO

## 2023-08-28 ENCOUNTER — Encounter: Payer: Self-pay | Admitting: Speech Pathology

## 2023-09-02 ENCOUNTER — Encounter: Payer: Self-pay | Admitting: Speech Pathology

## 2023-09-07 ENCOUNTER — Other Ambulatory Visit: Payer: Self-pay | Admitting: Family Medicine

## 2023-09-07 DIAGNOSIS — E785 Hyperlipidemia, unspecified: Secondary | ICD-10-CM

## 2023-09-09 ENCOUNTER — Encounter: Payer: Self-pay | Admitting: Speech Pathology

## 2023-09-10 ENCOUNTER — Encounter: Payer: Self-pay | Admitting: Speech Pathology

## 2023-09-16 ENCOUNTER — Encounter: Payer: Self-pay | Admitting: Speech Pathology

## 2023-09-23 ENCOUNTER — Encounter: Payer: Self-pay | Admitting: Speech Pathology

## 2023-09-30 ENCOUNTER — Ambulatory Visit
Admission: RE | Admit: 2023-09-30 | Discharge: 2023-09-30 | Disposition: A | Discharge status: Home / Self Care | Source: Ambulatory Visit | Attending: Internal Medicine | Admitting: Internal Medicine

## 2023-09-30 DIAGNOSIS — R112 Nausea with vomiting, unspecified: Secondary | ICD-10-CM

## 2023-09-30 DIAGNOSIS — K838 Other specified diseases of biliary tract: Secondary | ICD-10-CM | POA: Diagnosis not present

## 2023-09-30 DIAGNOSIS — R935 Abnormal findings on diagnostic imaging of other abdominal regions, including retroperitoneum: Secondary | ICD-10-CM

## 2023-09-30 DIAGNOSIS — R1011 Right upper quadrant pain: Secondary | ICD-10-CM | POA: Diagnosis not present

## 2023-09-30 MED ORDER — GADOPICLENOL 0.5 MMOL/ML IV SOLN
7.0000 mL | Freq: Once | INTRAVENOUS | Status: AC | PRN
  Administered 2023-09-30: 7 mL via INTRAVENOUS

## 2023-10-01 ENCOUNTER — Other Ambulatory Visit: Payer: Self-pay | Admitting: Family Medicine

## 2023-10-01 DIAGNOSIS — I10 Essential (primary) hypertension: Secondary | ICD-10-CM

## 2023-10-01 DIAGNOSIS — F419 Anxiety disorder, unspecified: Secondary | ICD-10-CM

## 2023-10-01 DIAGNOSIS — E785 Hyperlipidemia, unspecified: Secondary | ICD-10-CM

## 2023-10-02 ENCOUNTER — Telehealth: Payer: Self-pay | Admitting: Family Medicine

## 2023-10-02 ENCOUNTER — Telehealth: Payer: Self-pay | Admitting: Internal Medicine

## 2023-10-02 NOTE — Telephone Encounter (Signed)
[name] is returning your call 

## 2023-10-02 NOTE — Telephone Encounter (Signed)
 Copied from CRM [id number]. Topic: Medicare AWV >> Oct 02, 2023 11:51 AM Payton Doughty wrote: Reason for CRM: Called LVM 10/02/2023 to schedule AWV. Please schedule Virtual or Telehealth visits ONLY.   Verlee Rossetti; Care Guide Ambulatory Clinical Support [facility] Direct Dial: [phone]

## 2023-10-03 ENCOUNTER — Other Ambulatory Visit: Payer: Self-pay | Admitting: Family Medicine

## 2023-10-03 DIAGNOSIS — F419 Anxiety disorder, unspecified: Secondary | ICD-10-CM

## 2023-10-03 DIAGNOSIS — I10 Essential (primary) hypertension: Secondary | ICD-10-CM

## 2023-10-03 DIAGNOSIS — E785 Hyperlipidemia, unspecified: Secondary | ICD-10-CM

## 2023-10-03 NOTE — Telephone Encounter (Signed)
 Copied from CRM [id number]. Topic: Clinical - Medication Refill >> Oct 03, 2023 10:47 AM Florestine Avers wrote: Most Recent Primary Care Visit:  Provider: O'SULLIVAN, MELISSA  Department: [facility]  Visit Type: ACUTE  Date: 07/23/2023  Medication: ALPRAZolam (XANAX) 0.25 MG tablet  Has the patient contacted their pharmacy? Yes (Agent: If no, request that the patient contact the pharmacy for the refill. If patient does not wish to contact the pharmacy document the reason why and proceed with request.) (Agent: If yes, when and what did the pharmacy advise?)  Is this the correct pharmacy for this prescription? Yes If no, delete pharmacy and type the correct one.  This is the patient's preferred pharmacy:  [id number] - [place], [place] - [address] [name] [address] Phone: [phone] Fax: [phone]  [facility] [address] [place] [address] Phone: [phone] Fax: [phone]   Has the prescription been filled recently? No  Is the patient out of the medication? Yes  Has the patient been seen for an appointment in the last year OR does the patient have an upcoming appointment? Yes  Can we respond through MyChart? Yes  Agent: Please be advised that Rx refills may take up to 3 business days. We ask that you follow-up with your pharmacy.

## 2023-10-04 ENCOUNTER — Other Ambulatory Visit: Payer: Self-pay | Admitting: Internal Medicine

## 2023-10-04 ENCOUNTER — Telehealth: Payer: Self-pay

## 2023-10-04 ENCOUNTER — Other Ambulatory Visit: Payer: Self-pay | Admitting: Family Medicine

## 2023-10-04 DIAGNOSIS — F419 Anxiety disorder, unspecified: Secondary | ICD-10-CM

## 2023-10-04 DIAGNOSIS — E785 Hyperlipidemia, unspecified: Secondary | ICD-10-CM

## 2023-10-04 DIAGNOSIS — K2970 Gastritis, unspecified, without bleeding: Secondary | ICD-10-CM

## 2023-10-04 DIAGNOSIS — I10 Essential (primary) hypertension: Secondary | ICD-10-CM

## 2023-10-04 MED ORDER — ALPRAZOLAM 0.25 MG PO TABS: 0.2500 mg | ORAL_TABLET | Freq: Three times a day (TID) | ORAL | 0 refills | Status: DC | PRN

## 2023-10-04 NOTE — Telephone Encounter (Signed)
 Copied from CRM [id number]. Topic: Clinical - Prescription Issue >> Oct 04, 2023 12:40 PM Pascal Lux wrote: Reason for CRM: Patient stated she is out of her medication and spoke with someone who told her that Dr. Zola Button would be able to provide her few samples of her medication: ALPRAZolam (XANAX) 0.25 MG tablet while she waits for her prescription. Patient requesting a follow up before the day ends.

## 2023-10-07 ENCOUNTER — Other Ambulatory Visit: Payer: Self-pay | Admitting: Family Medicine

## 2023-10-07 DIAGNOSIS — E785 Hyperlipidemia, unspecified: Secondary | ICD-10-CM

## 2023-10-07 DIAGNOSIS — I10 Essential (primary) hypertension: Secondary | ICD-10-CM

## 2023-10-07 DIAGNOSIS — F419 Anxiety disorder, unspecified: Secondary | ICD-10-CM

## 2023-10-07 MED ORDER — ALPRAZOLAM 0.25 MG PO TABS: 0.2500 mg | ORAL_TABLET | Freq: Three times a day (TID) | ORAL | 0 refills | Status: DC | PRN

## 2023-10-14 ENCOUNTER — Ambulatory Visit (INDEPENDENT_AMBULATORY_CARE_PROVIDER_SITE_OTHER): Admitting: Family Medicine

## 2023-10-14 ENCOUNTER — Encounter: Payer: Self-pay | Admitting: Family Medicine

## 2023-10-14 VITALS — BP 122/78 | HR 81 | Temp 98.5°F | Resp 18 | Ht 61.0 in | Wt 159.2 lb

## 2023-10-14 DIAGNOSIS — E785 Hyperlipidemia, unspecified: Secondary | ICD-10-CM

## 2023-10-14 DIAGNOSIS — M069 Rheumatoid arthritis, unspecified: Secondary | ICD-10-CM

## 2023-10-14 DIAGNOSIS — Z79899 Other long term (current) drug therapy: Secondary | ICD-10-CM

## 2023-10-14 DIAGNOSIS — I10 Essential (primary) hypertension: Secondary | ICD-10-CM

## 2023-10-14 DIAGNOSIS — E11649 Type 2 diabetes mellitus with hypoglycemia without coma: Secondary | ICD-10-CM

## 2023-10-14 DIAGNOSIS — F419 Anxiety disorder, unspecified: Secondary | ICD-10-CM | POA: Diagnosis not present

## 2023-10-14 DIAGNOSIS — N1831 Chronic kidney disease, stage 3a: Secondary | ICD-10-CM

## 2023-10-14 DIAGNOSIS — J449 Chronic obstructive pulmonary disease, unspecified: Secondary | ICD-10-CM | POA: Diagnosis not present

## 2023-10-14 DIAGNOSIS — E1151 Type 2 diabetes mellitus with diabetic peripheral angiopathy without gangrene: Secondary | ICD-10-CM

## 2023-10-14 DIAGNOSIS — E1165 Type 2 diabetes mellitus with hyperglycemia: Secondary | ICD-10-CM

## 2023-10-14 MED ORDER — SPIRONOLACTONE 25 MG PO TABS
25.0000 mg | ORAL_TABLET | ORAL | 0 refills | Status: AC
Start: 2023-10-14 — End: ?

## 2023-10-14 NOTE — Assessment & Plan Note (Signed)
 hgba1c to be checked, minimize simple carbs. Increase exercise as tolerated. Continue current meds

## 2023-10-14 NOTE — Assessment & Plan Note (Signed)
 Well controlled, no changes to meds. Encouraged heart healthy diet such as the DASH diet and exercise as tolerated.

## 2023-10-14 NOTE — Assessment & Plan Note (Signed)
 Stop ozempic  due to diarrhea  Check labs today

## 2023-10-14 NOTE — Assessment & Plan Note (Signed)
 Encourage heart healthy diet such as MIND or DASH diet, increase exercise, avoid trans fats, simple carbohydrates and processed foods, consider a krill or fish or flaxseed oil cap daily.

## 2023-10-14 NOTE — Progress Notes (Signed)
 Established Patient Office Visit  Subjective   Patient ID: Beth Shepard, female    DOB: 10/22/1955  Age: 68 y.o. MRN: [id number]  Chief Complaint  Patient presents with   Diabetes   Hyperlipidemia   Follow-up    HPI Discussed the use of AI scribe software for clinical note transcription with the patient, who gave verbal consent to proceed.  History of Present Illness Beth Shepard "[name]" is a 68 year old female with IBS who presents with worsening diarrhea and abdominal cramping.  She has been experiencing increased diarrhea and severe abdominal cramping, describing the pain as similar to 'having children.' The diarrhea is persistent, causing frequent trips to the bathroom, and the cramping does not subside after bowel movements.  Her history of IBS has been exacerbated since her Ozempic  dosage was adjusted from 0.5 to 2.5 mg. The IBS symptoms, particularly diarrhea, have worsened since this change. She is currently taking pantoprazole  twice daily and Ozempic  weekly on Sundays. She has not been monitoring her blood sugars recently.  She underwent an MRI which showed dilated ducts but no stones, and she has had extensive prior testing including CT scans and MRIs. Previous evaluations, including a neurologist consultation, did not reveal any significant issues.  She also experiences episodes where she 'blacks out,' describing it as knowing what she wants to say but being unable to articulate it, particularly when flustered.   Patient Active Problem List   Diagnosis Date Noted   Traumatic injury of head 07/23/2023   Acute pain of right shoulder 07/23/2023   Rheumatoid arthritis involving multiple joints (HCC) 12/22/2021   Type 2 diabetes mellitus with diabetic peripheral angiopathy without gangrene, without long-term current use of insulin  (HCC) 12/22/2021   Hyperlipidemia 09/18/2021   Memory loss 09/08/2021   Thrush 09/08/2021   IBS (irritable bowel syndrome) 05/11/2020    Uncontrolled type 2 diabetes mellitus with hyperglycemia (HCC) 02/01/2020   Nausea without vomiting 01/27/2019   Preventative health care 12/18/2017   Infected sebaceous cyst of skin 07/19/2017   DCM (dilated cardiomyopathy) (HCC) 07/10/2017   Fibromyalgia 04/05/2017   Chronic pain syndrome 04/05/2017   Pain in both lower extremities 02/06/2017   Chronic venous insufficiency 02/06/2017   Cellulitis of lower extremity 08/28/2016   Pain in right ankle and joints of right foot 08/09/2016   Peripheral edema 07/10/2016   Trochanteric bursitis, right hip 05/03/2016   Chronic kidney disease (CKD) stage G3a/A1, moderately decreased glomerular filtration rate (GFR) between 45-59 mL/min/1.73 square meter and albuminuria creatinine ratio less than 30 mg/g (HCC) 04/07/2016   Primary osteoarthritis of right hip 10/24/2015   Hip joint replacement status 10/24/2015   Chest pain 08/25/2015   Right hip pain 08/25/2015   Chest pain, precordial 08/10/2015   Precordial pain 08/10/2015   Cardiomyopathy (HCC) 01/18/2015   Block, bundle branch, left 01/18/2015   Left bundle-branch block 01/18/2015   Diabetes (HCC) 01/18/2015   Nausea alone 12/17/2013   Alternating constipation and diarrhea 11/19/2013   Bloating 11/19/2013   Obesity (BMI 30-39.9) 10/21/2013   Multiple joint pain 11/13/2012   MYALGIA 08/29/2010   ABDOMINAL PAIN OTHER SPECIFIED SITE 08/22/2009   Osteoarthritis 03/17/2009   Cellulitis and abscess of face 12/17/2008   Disturbance of skin sensation 12/17/2008   Bronchitis 05/07/2008   Anxiety 12/04/2007   SINUSITIS, ACUTE NOS 12/19/2006   Diabetes mellitus, type II (HCC) 10/08/2006   Hyperlipidemia LDL goal <70 10/08/2006   Essential hypertension 10/08/2006   COPD (chronic obstructive pulmonary disease) (HCC) 10/08/2006  GERD 10/08/2006   Other postprocedural status(V45.89) 10/08/2006   Past Medical History:  Diagnosis Date   Anxiety    Bilateral edema of lower extremity     Swelling in legs and ankles   Congestive heart failure (HCC)    COPD (chronic obstructive pulmonary disease) (HCC)    Diabetes mellitus    type 2   Gastric polyps    GERD (gastroesophageal reflux disease)    chronic   Hyperlipidemia    Hypertension    IBS (irritable bowel syndrome)    LBBB (left bundle branch block)    Dr. Keren Peasant, [facility]   Pneumonia    hx of   PONV (postoperative nausea and vomiting)    Had N/V after having tubal ligation and D&C   Rheumatoid arthritis (HCC)    Shingles    Past Surgical History:  Procedure Laterality Date   CARDIAC CATHETERIZATION     No PCI   CHOLECYSTECTOMY     COLONOSCOPY  06/06/2005   normal    DILATION AND CURETTAGE OF UTERUS     ESOPHAGOGASTRODUODENOSCOPY  06/06/2005, 11/17/2013   JOINT REPLACEMENT Right 10/24/2015   rt hip replacement   KNEE ARTHROSCOPY Left    TOTAL HIP ARTHROPLASTY Right 10/24/2015   Procedure: RIGHT TOTAL HIP ARTHROPLASTY ANTERIOR APPROACH;  Surgeon: Wes Hamman, MD;  Location: [facility] OR;  Service: Orthopedics;  Laterality: Right;   TUBAL LIGATION     VAGINAL HYSTERECTOMY     Social History   Tobacco Use   Smoking status: Former    Current packs/day: 0.00    Types: Cigarettes    Start date: 06/25/1969    Quit date: 06/25/2002    Years since quitting: 21.3   Smokeless tobacco: Never  Vaping Use   Vaping status: Never Used  Substance Use Topics   Alcohol use: Yes    Comment: rare   Drug use: No   Social History   Socioeconomic History   Marital status: Widowed    Spouse name: Not on file   Number of children: 2   Years of education: Not on file   Highest education level: Not on file  Occupational History   Occupation: Administive - retired    Associate Professor: [employer]  Tobacco Use   Smoking status: Former    Current packs/day: 0.00    Types: Cigarettes    Start date: 06/25/1969    Quit date: 06/25/2002    Years since quitting: 21.3   Smokeless tobacco: Never  Vaping Use    Vaping status: Never Used  Substance and Sexual Activity   Alcohol use: Yes    Comment: rare   Drug use: No   Sexual activity: Not Currently    Partners: Male  Other Topics Concern   Not on file  Social History Narrative   Limited exercise per cardiology   Right handed   Caffeine-2 cups daily   Lives alone, still driving, has gotten lost 1 time   Social Drivers of Corporate investment banker Strain: Low Risk  (11/07/2021)   Overall Financial Resource Strain (CARDIA)    Difficulty of Paying Living Expenses: Not very hard  Food Insecurity: Not on file  Transportation Needs: No Transportation Needs (01/17/2022)   PRAPARE - Administrator, Civil Service (Medical): No    Lack of Transportation (Non-Medical): No  Physical Activity: Insufficiently Active (02/14/2023)   Exercise Vital Sign    Days of Exercise per Week: 3 days  Minutes of Exercise per Session: 10 min  Stress: Not on file  Social Connections: Not on file  Intimate Partner Violence: Not on file   Family Status  Relation Name Status   Mother  Deceased at age 52   Father  Deceased at age 27       septic shock   Sister  Alive   MGM  Deceased at age 32       MI   MGF  Deceased   PGM  Deceased   PGF  Deceased   Daughter  Alive   Daughter  Alive   Neg Hx  (Not Specified)  No partnership data on file   Family History  Problem Relation Age of Onset   Lymphoma Mother    Heart disease Mother        chf, cabg x6   Hypertension Mother    GER disease Mother    Hiatal hernia Mother    Gallstones Mother    Rheum arthritis Father    Dementia Father    Brain cancer Sister        brain stage 4   Heart disease Maternal Grandmother 32       MI   Heart disease Maternal Grandfather    Stroke Paternal Grandmother    Hypertension Paternal Grandmother    Stroke Paternal Grandfather    Hypertension Paternal Grandfather    Hypertension Daughter    Colon cancer Neg Hx    Esophageal cancer Neg Hx     Pancreatic cancer Neg Hx    Liver disease Neg Hx    Kidney disease Neg Hx    Stomach cancer Neg Hx    Rectal cancer Neg Hx    Allergies  Allergen Reactions   Prednisone  Palpitations and Other (See Comments)    Dose pack causes vomiting; Causes Gerd N/v/d N/v/d    Codeine Nausea Only and Palpitations    REACTION: palpitation  REACTION: palpitation     Ibuprofen Other (See Comments) and Rash    REACTION: questionable. Pt states that she can take this sometimes. GERD      Review of Systems  Constitutional:  Negative for fever and malaise/fatigue.  HENT:  Negative for congestion.   Eyes:  Negative for blurred vision.  Respiratory:  Negative for cough and shortness of breath.   Cardiovascular:  Negative for chest pain, palpitations and leg swelling.  Gastrointestinal:  Negative for abdominal pain, blood in stool, nausea and vomiting.  Genitourinary:  Negative for dysuria and frequency.  Musculoskeletal:  Negative for back pain and falls.  Skin:  Negative for rash.  Neurological:  Negative for dizziness, loss of consciousness and headaches.  Endo/Heme/Allergies:  Negative for environmental allergies.  Psychiatric/Behavioral:  Positive for memory loss. Negative for depression. The patient is not nervous/anxious.       Objective:     BP 122/78 (BP Location: Left Arm, Patient Position: Sitting, Cuff Size: Large)   Pulse 81   Temp 98.5 F (36.9 C) (Oral)   Resp 18   Ht 5\' 1"  (1.549 m)   Wt 159 lb 3.2 oz (72.2 kg)   SpO2 97%   BMI 30.08 kg/m  BP Readings from Last 3 Encounters:  10/14/23 122/78  08/08/23 100/60  07/23/23 100/70   Wt Readings from Last 3 Encounters:  10/14/23 159 lb 3.2 oz (72.2 kg)  07/23/23 149 lb 3.2 oz (67.7 kg)  05/15/23 154 lb (69.9 kg)   SpO2 Readings from Last 3 Encounters:  10/14/23 97%  08/08/23 96%  07/23/23 98%      Physical Exam Vitals and nursing note reviewed.  Constitutional:      General: She is not in acute distress.     Appearance: Normal appearance. She is well-developed.  HENT:     Head: Normocephalic and atraumatic.  Eyes:     General: No scleral icterus.       Right eye: No discharge.        Left eye: No discharge.  Cardiovascular:     Rate and Rhythm: Normal rate and regular rhythm.     Heart sounds: No murmur heard. Pulmonary:     Effort: Pulmonary effort is normal. No respiratory distress.     Breath sounds: Normal breath sounds.  Musculoskeletal:        General: Normal range of motion.     Cervical back: Normal range of motion and neck supple.     Right lower leg: No edema.     Left lower leg: No edema.  Skin:    General: Skin is warm and dry.  Neurological:     General: No focal deficit present.     Mental Status: She is alert and oriented to person, place, and time.  Psychiatric:        Mood and Affect: Mood normal.        Behavior: Behavior normal.        Thought Content: Thought content normal.        Judgment: Judgment normal.      No results found for any visits on 10/14/23.  Last CBC Lab Results  Component Value Date   WBC 8.3 07/23/2023   HGB 12.8 07/23/2023   HCT 38.9 07/23/2023   MCV 86.9 07/23/2023   MCH 28.8 12/28/2022   RDW 12.5 07/23/2023   PLT 226.0 07/23/2023   Last metabolic panel Lab Results  Component Value Date   GLUCOSE 107 (H) 07/23/2023   NA 139 07/23/2023   K 3.4 (L) 07/23/2023   CL 104 07/23/2023   CO2 26 07/23/2023   BUN 16 07/23/2023   CREATININE 1.00 07/23/2023   GFR 57.28 (L) 07/23/2023   CALCIUM  9.8 07/23/2023   PROT 7.2 07/23/2023   ALBUMIN 4.6 07/23/2023   LABGLOB 2.6 12/22/2021   AGRATIO 1.7 12/22/2021   BILITOT 0.7 07/23/2023   ALKPHOS 81 07/23/2023   AST 22 07/23/2023   ALT 12 07/23/2023   ANIONGAP 8 05/29/2022   Last lipids Lab Results  Component Value Date   CHOL 158 12/28/2022   HDL 67 12/28/2022   LDLCALC 75 12/28/2022   LDLDIRECT 148.0 09/11/2021   TRIG 79 12/28/2022   CHOLHDL 2.4 12/28/2022   Last hemoglobin  A1c Lab Results  Component Value Date   HGBA1C 5.9 (H) 12/28/2022   Last thyroid  functions Lab Results  Component Value Date   TSH 2.27 07/06/2021   Last vitamin D  Lab Results  Component Value Date   VD25OH 16 (L) 08/29/2010   Last vitamin B12 and Folate Lab Results  Component Value Date   VITAMINB12 281 01/29/2020      The 10-year ASCVD risk score (Arnett DK, et al., 2019) is: 19.1%    Assessment & Plan:   Problem List Items Addressed This Visit       Unprioritized   COPD (chronic obstructive pulmonary disease) (HCC)   Type 2 diabetes mellitus with diabetic peripheral angiopathy without gangrene, without long-term current use of insulin  (HCC)   Relevant Medications   spironolactone  (ALDACTONE ) 25  MG tablet   Other Relevant Orders   Hemoglobin A1c   Microalbumin / creatinine urine ratio   Rheumatoid arthritis involving multiple joints (HCC)   Uncontrolled type 2 diabetes mellitus with hyperglycemia (HCC)   Stop ozempic  due to diarrhea  Check labs today      Hyperlipidemia   Encourage heart healthy diet such as MIND or DASH diet, increase exercise, avoid trans fats, simple carbohydrates and processed foods, consider a krill or fish or flaxseed oil cap daily.        Relevant Medications   spironolactone  (ALDACTONE ) 25 MG tablet   Other Relevant Orders   Lipid panel   CBC with Differential/Platelet   Comprehensive metabolic panel with GFR   Essential hypertension - Primary   Well controlled, no changes to meds. Encouraged heart healthy diet such as the DASH diet and exercise as tolerated.        Relevant Medications   spironolactone  (ALDACTONE ) 25 MG tablet   Other Relevant Orders   Lipid panel   CBC with Differential/Platelet   Comprehensive metabolic panel with GFR   Diabetes mellitus, type II (HCC)   hgba1c to be checked , minimize simple carbs. Increase exercise as tolerated. Continue current meds       Relevant Orders   Hemoglobin A1c    Microalbumin / creatinine urine ratio   Chronic kidney disease (CKD) stage G3a/A1, moderately decreased glomerular filtration rate (GFR) between 45-59 mL/min/1.73 square meter and albuminuria creatinine ratio less than 30 mg/g (HCC)   Check labs       Anxiety   Stable       Other Visit Diagnoses       High risk medication use       Relevant Orders   Drug Monitoring Panel [id number] , Urine     Assessment and Plan Assessment & Plan Abdominal Pain   She experiences chronic abdominal pain characterized by severe, cramping, labor-like sensations. The pain is intermittent and not consistently linked to bowel movements, with a possible exacerbation by Ozempic . The differential diagnosis includes IBS exacerbation or constipation with overflow diarrhea. Stop Ozempic  and monitor for symptom improvement. Inform Dr. Rosaline Coma about ongoing symptoms for further evaluation. Order labs to assess her current status.  Diarrhea   She has chronic, frequent, and urgent diarrhea, potentially worsened by Ozempic . The differential diagnosis includes IBS exacerbation or a medication side effect. Stop Ozempic  and monitor for symptom improvement. Order labs to assess her current status.  Irritable Bowel Syndrome (IBS)   Her long-standing IBS has recently worsened, with increased diarrhea and severe cramping, possibly exacerbated by Ozempic . Consider dietary modifications and probiotics for management. Stop Ozempic  and monitor for symptom improvement. Consider increasing dietary fiber or using fiber supplements like Metamucil or Benefiber. Consider adding a probiotic to her regimen.    No follow-ups on file.    Floyce Bujak R Lowne Chase, DO

## 2023-10-14 NOTE — Assessment & Plan Note (Signed)
 Stable

## 2023-10-14 NOTE — Assessment & Plan Note (Signed)
 Check labs

## 2023-10-15 LAB — CBC WITH DIFFERENTIAL/PLATELET
Basophils Absolute: 0.1 10*3/uL (ref 0.0–0.1)
Basophils Relative: 0.8 % (ref 0.0–3.0)
Eosinophils Absolute: 0.2 10*3/uL (ref 0.0–0.7)
Eosinophils Relative: 2.3 % (ref 0.0–5.0)
HCT: 39.1 % (ref 36.0–46.0)
Hemoglobin: 12.9 g/dL (ref 12.0–15.0)
Lymphocytes Relative: 21.7 % (ref 12.0–46.0)
Lymphs Abs: 1.6 10*3/uL (ref 0.7–4.0)
MCHC: 33 g/dL (ref 30.0–36.0)
MCV: 87.5 fl (ref 78.0–100.0)
Monocytes Absolute: 0.5 10*3/uL (ref 0.1–1.0)
Monocytes Relative: 7.5 % (ref 3.0–12.0)
Neutro Abs: 4.9 10*3/uL (ref 1.4–7.7)
Neutrophils Relative %: 67.7 % (ref 43.0–77.0)
Platelets: 244 10*3/uL (ref 150.0–400.0)
RBC: 4.47 Mil/uL (ref 3.87–5.11)
RDW: 12.9 % (ref 11.5–15.5)
WBC: 7.3 10*3/uL (ref 4.0–10.5)

## 2023-10-15 LAB — MICROALBUMIN / CREATININE URINE RATIO
Creatinine,U: 33.8 mg/dL
Microalb Creat Ratio: 29.8 mg/g (ref 0.0–30.0)
Microalb, Ur: 1 mg/dL (ref 0.0–1.9)

## 2023-10-15 LAB — COMPREHENSIVE METABOLIC PANEL WITH GFR
ALT: 12 U/L (ref 0–35)
AST: 21 U/L (ref 0–37)
Albumin: 4.4 g/dL (ref 3.5–5.2)
Alkaline Phosphatase: 67 U/L (ref 39–117)
BUN: 19 mg/dL (ref 6–23)
CO2: 28 meq/L (ref 19–32)
Calcium: 9.9 mg/dL (ref 8.4–10.5)
Chloride: 101 meq/L (ref 96–112)
Creatinine, Ser: 1.08 mg/dL (ref 0.40–1.20)
GFR: 52.14 mL/min — ABNORMAL LOW (ref >60.00)
Glucose, Bld: 102 mg/dL — ABNORMAL HIGH (ref 70–99)
Potassium: 4.4 meq/L (ref 3.5–5.1)
Sodium: 138 meq/L (ref 135–145)
Total Bilirubin: 0.6 mg/dL (ref 0.2–1.2)
Total Protein: 7.4 g/dL (ref 6.0–8.3)

## 2023-10-15 LAB — LIPID PANEL
Cholesterol: 135 mg/dL (ref 0–200)
HDL: 61.6 mg/dL (ref >39.00)
LDL Cholesterol: 57 mg/dL (ref 0–99)
NonHDL: 73.65
Total CHOL/HDL Ratio: 2
Triglycerides: 82 mg/dL (ref 0.0–149.0)
VLDL: 16.4 mg/dL (ref 0.0–40.0)

## 2023-10-15 LAB — HEMOGLOBIN A1C: Hgb A1c MFr Bld: 6 % (ref 4.6–6.5)

## 2023-10-16 ENCOUNTER — Encounter: Payer: Self-pay | Admitting: Family Medicine

## 2023-10-16 LAB — DRUG MONITORING PANEL 376104, URINE
Amphetamines: NEGATIVE ng/mL (ref <500)
Barbiturates: NEGATIVE ng/mL (ref <300)
Benzodiazepines: NEGATIVE ng/mL (ref <100)
Cocaine Metabolite: NEGATIVE ng/mL (ref <150)
Desmethyltramadol: NEGATIVE ng/mL (ref <100)
Opiates: NEGATIVE ng/mL (ref <100)
Oxycodone: NEGATIVE ng/mL (ref <100)
Tramadol: NEGATIVE ng/mL (ref <100)

## 2023-10-16 LAB — DM TEMPLATE

## 2023-10-21 ENCOUNTER — Telehealth: Payer: Medicare HMO

## 2023-10-21 ENCOUNTER — Telehealth: Payer: Self-pay | Admitting: Pharmacist

## 2023-10-21 NOTE — Telephone Encounter (Signed)
 Attempt was made to contact patient by phone today for follow up by Clinical Pharmacist regarding medication adherence and diabetes.  Unable to reach patient. LM on VM with my contact number [phone].

## 2023-10-22 ENCOUNTER — Encounter: Payer: Self-pay | Admitting: Pharmacist

## 2023-10-22 NOTE — Progress Notes (Signed)
 10/22/2023 Name: Beth Shepard MRN: [id number] DOB: [birth year]  Chief Complaint  Patient presents with   Medication Management    [name] is a 68 y.o. year old female ; spoke with patient  Had a phone appointment scheduled with patient yesterday but she did not answer. She called back today and phone visit was completed.    They were referred to the pharmacist by their PCP for assistance in managing diabetes and medication access.    Subjective:  Diabetes:   Current medications: None  She previously was taking Ozempic  but it was stopped due to GI symptoms and since A1c was 6.0%  Patient has been seeing GI for abdominal pain and frequent loose stools. She has has recently had endoscopy and colonoscopy.  Also has MRI which did not show any gallbladder issues.   She also had abdominal Ultrasound showed no acute issues.  Wt Readings from Last 3 Encounters:  10/14/23 159 lb 3.2 oz (72.2 kg)  07/23/23 149 lb 3.2 oz (67.7 kg)  05/15/23 154 lb (69.9 kg)   BMI = 30.08   Hyperlipidemia/ASCVD Risk Reduction  Current lipid lowering medications: rosuvastatin    Antiplatelet regimen: aspirin 81mg  daily      Cardiomyopathy / Heart Failure (EF 30-35%)  Current medications:  ACEi/ARB/ARNI: no SGLT2i: no Beta blocker: no  Mineralocorticoid Receptor Antagonist: yes Diuretic regimen: yes   Objective:  Lab Results  Component Value Date   HGBA1C 6.0 10/14/2023    Lab Results  Component Value Date   CREATININE 1.08 10/14/2023   BUN 19 10/14/2023   NA 138 10/14/2023   K 4.4 10/14/2023   CL 101 10/14/2023   CO2 28 10/14/2023    Lab Results  Component Value Date   CHOL 135 10/14/2023   HDL 61.60 10/14/2023   LDLCALC 57 10/14/2023   LDLDIRECT 148.0 09/11/2021   TRIG 82.0 10/14/2023   CHOLHDL 2 10/14/2023   BP Readings from Last 3 Encounters:  10/14/23 122/78  08/08/23 100/60  07/23/23 100/70     Medications Reviewed Today     Reviewed by Cecilie Coffee, RPH-CPP (Pharmacist) on 10/22/23 at 1619  Med List Status: <None>   Medication Order Taking? Sig Documenting Provider Last Dose Status Informant  0.9 %  sodium chloride  infusion [id number]   Daina Drum, MD  Active   ALPRAZolam  (XANAX ) 0.25 MG tablet [id number] Yes Take 1 tablet (0.25 mg total) by mouth 3 (three) times daily as needed. Roel Clarity R, DO Taking Active   aspirin 81 MG tablet [id number] Yes Take 81 mg by mouth daily. [provider] Taking Active Self  blood glucose meter kit and supplies KIT [id number] Yes Dispense based on patient and insurance preference. Use up to four times daily as directed. Roel Clarity R, DO Taking Active   Calcium  Carb-Cholecalciferol (CALCIUM -VITAMIN D ) 500-200 MG-UNIT tablet [id number] Yes Take 1 tablet by mouth daily. [provider] Taking Active   furosemide  (LASIX ) 20 MG tablet [id number] Yes Take 1 tablet (20 mg total) by mouth daily. Roel Clarity R, DO Taking Active   pantoprazole  (PROTONIX ) 40 MG tablet [id number] Yes TAKE 1 TABLET (40 MG TOTAL) BY MOUTH 2 (TWO) TIMES DAILY. TAKE TWICE DAILY FOR 8 WEEKS THEN DAILY Daina Drum, MD Taking Active   rosuvastatin  (CRESTOR ) 10 MG tablet [id number] Yes TAKE 1 TABLET BY MOUTH EVERY DAY Lowne Chase, Yvonne R, DO Taking Active   spironolactone  (ALDACTONE ) 25 MG tablet [id number] Yes Take 1 tablet (25 mg  total) by mouth every other day. Estill Hemming, DO Taking Active              Assessment/Plan:   Diabetes: Currently controlled - A1c has been at goal.  - Remain off Ozempic . [facility].  - follow low CHO diet / avoiding sugar containing foods - Encouraged her to be active - goal is to work up to 150 minutes of physical activity each week if able. Continue to do exercises form physical therapy.    Hypertension: Currently controlled - Recommend to continue current blood pressure lowering medications.     Hyperlipidemia/ASCVD  Risk Reduction:Currently controlled.  Continue rosuvastatin     Cardiomyopathy:Currently appropriately managed - low blood pressure has prevented addition of SGLT2 and beta blocker - Recommend to Continue furosemide , spironolactone  .    Follow Up Plan: Patient is aware she can contact Clinical Pharmacist Practitioner if any questions or concerns regarding medications.   Cecilie Coffee, PharmD Clinical Pharmacist [facility]

## 2023-10-30 ENCOUNTER — Ambulatory Visit: Payer: Medicare HMO | Admitting: Podiatry

## 2023-10-30 DIAGNOSIS — B351 Tinea unguium: Secondary | ICD-10-CM

## 2023-10-30 DIAGNOSIS — M79674 Pain in right toe(s): Secondary | ICD-10-CM | POA: Diagnosis not present

## 2023-10-30 DIAGNOSIS — M79675 Pain in left toe(s): Secondary | ICD-10-CM | POA: Diagnosis not present

## 2023-10-30 DIAGNOSIS — L853 Xerosis cutis: Secondary | ICD-10-CM

## 2023-10-30 NOTE — Progress Notes (Signed)
 Subjective:  Patient ID: Beth Shepard, female    DOB: [birth year],  MRN: [id number]  Beth Shepard presents to clinic today for:  Chief Complaint  Patient presents with   [facility]    [facility] with out callous.  Last A1c 6.0, 2 weeks ago and takes ASA 81   Patient notes nails are thick, discolored, elongated and painful in shoegear when trying to ambulate.  She also would like recommendations to treat her dry skin.  PCP is Crecencio Dodge, [place] R, DO.  Past Medical History:  Diagnosis Date   Anxiety    Bilateral edema of lower extremity    Swelling in legs and ankles   Congestive heart failure (HCC)    COPD (chronic obstructive pulmonary disease) (HCC)    Diabetes mellitus    type 2   Gastric polyps    GERD (gastroesophageal reflux disease)    chronic   Hyperlipidemia    Hypertension    IBS (irritable bowel syndrome)    LBBB (left bundle branch block)    Dr. Keren Peasant, [facility]   Pneumonia    hx of   PONV (postoperative nausea and vomiting)    Had N/V after having tubal ligation and D&C   Rheumatoid arthritis (HCC)    Shingles    Past Surgical History:  Procedure Laterality Date   CARDIAC CATHETERIZATION     No PCI   CHOLECYSTECTOMY     COLONOSCOPY  06/06/2005   normal    DILATION AND CURETTAGE OF UTERUS     ESOPHAGOGASTRODUODENOSCOPY  06/06/2005, 11/17/2013   JOINT REPLACEMENT Right 10/24/2015   rt hip replacement   KNEE ARTHROSCOPY Left    TOTAL HIP ARTHROPLASTY Right 10/24/2015   Procedure: RIGHT TOTAL HIP ARTHROPLASTY ANTERIOR APPROACH;  Surgeon: Wes Hamman, MD;  Location: [facility] OR;  Service: Orthopedics;  Laterality: Right;   TUBAL LIGATION     VAGINAL HYSTERECTOMY     Allergies  Allergen Reactions   Prednisone  Palpitations and Other (See Comments)    Dose pack causes vomiting; Causes Gerd N/v/d N/v/d    Codeine Nausea Only and Palpitations    REACTION: palpitation  REACTION: palpitation     Ibuprofen Other (See Comments) and Rash    REACTION:  questionable. Pt states that she can take this sometimes. GERD    Review of Systems: Negative except as noted in the HPI.  Objective:  Beth Shepard is a pleasant 68 y.o. female in NAD. AAO x 3.  Vascular Examination: Capillary refill time is 3-5 seconds to toes bilateral. Palpable pedal pulses b/l LE. Digital hair present b/l.  Skin temperature gradient WNL b/l. No varicosities b/l. No cyanosis noted b/l.   Dermatological Examination: Pedal skin with normal turgor, texture and tone b/l. No open wounds. No interdigital macerations b/l. Toenails x10 are 3mm thick, discolored, dystrophic with subungual debris. There is pain with compression of the nail plates.  They are elongated x10.  The skin is dry and flaky to both lower legs and feet.     Latest Ref Rng & Units 10/14/2023    1:54 PM 12/28/2022    3:46 PM  Hemoglobin A1C  Hemoglobin-A1c 4.6 - 6.5 % 6.0  5.9      Assessment/Plan: 1. Pain due to onychomycosis of toenails of both feet   2. Xerosis of skin    The mycotic toenails were sharply debrided x10 with sterile nail nippers and a power debriding burr to decrease bulk/thickness and length.    Patient was  given samples of Skintegrity Cream for her dry skin to apply daily.  Also recommended OptiNail solution for the toenails to help improve appearance.  Return in about 3 months (around 01/30/2024) for [facility].   Joe Murders, DPM, FACFAS [facility]     [address] [place], [place] [address]                Office [phone]  Fax [phone]

## 2023-11-07 ENCOUNTER — Encounter: Payer: Self-pay | Admitting: Pharmacist

## 2023-11-07 NOTE — Progress Notes (Signed)
 Pharmacy Quality Measure Review  This patient is appearing on a report for being at risk of failing the adherence measure for cholesterol (statin) medications this calendar year.   Medication: rosuvastatin  10mg  Last fill date: 08/10/2023 for 30 day supply Rx was filled per [facility] on 09/09/2023 for 90 day supply but was not picked up, so they returned to stock.  At appointment 10/22/2023 patient endorsed that she was taking rosuvastatin .     Left voicemail for patient to return my call at their convenience. Also left message on VM of patient's daughter Claudetta Cuba.   Cecilie Coffee, PharmD Clinical Pharmacist [facility]  Population Health [phone]

## 2023-11-20 IMAGING — DX DG CHEST 2V
2 series · 2 of 2 positions shown · non-contrast
Comparison: X-ray chest 11/11/2020.

CLINICAL DATA: Cough for 3 weeks. Shortness of breath, fatigue and
diarrhea. History of pneumonia.

EXAM:
CHEST - 2 VIEW

[chest pa]
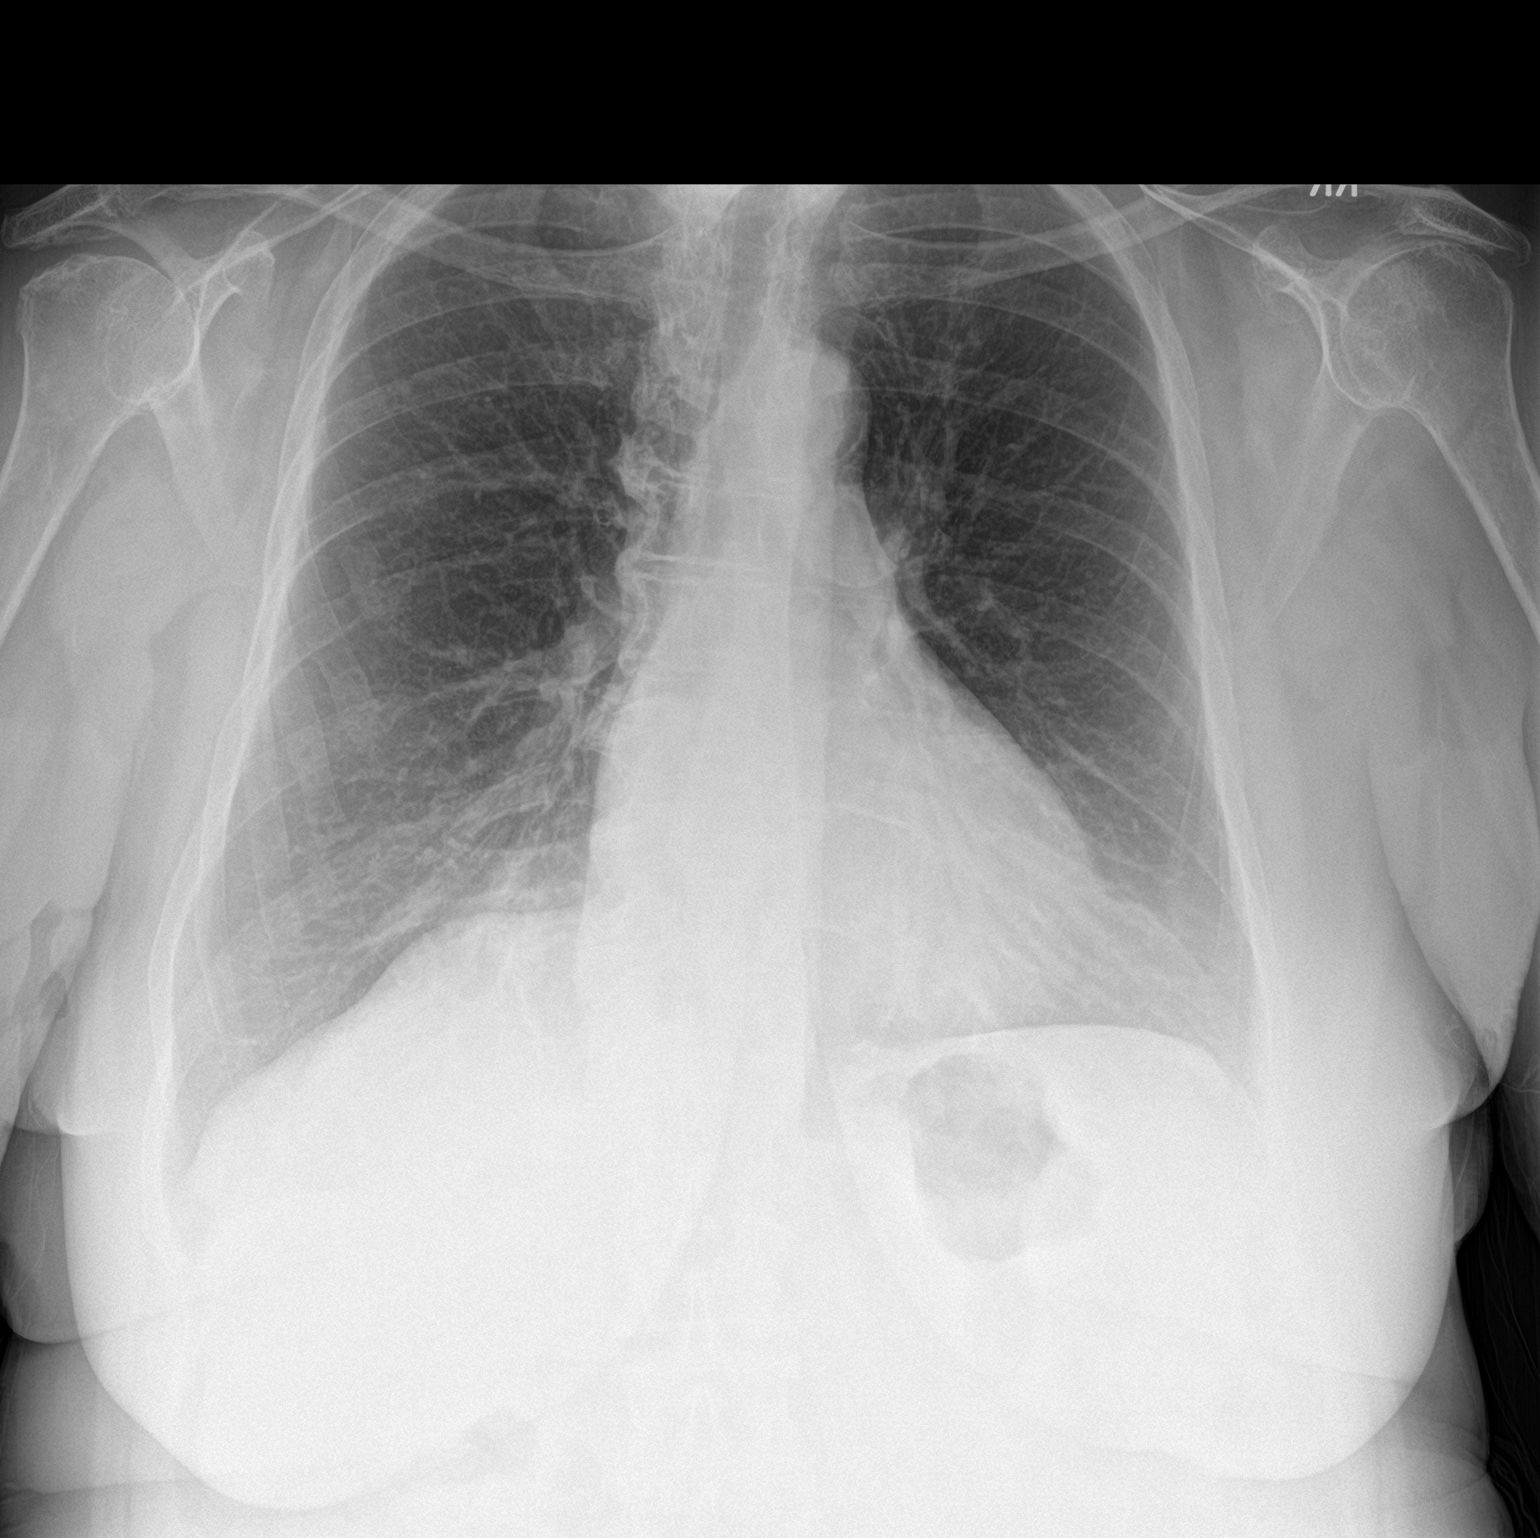

[chest lat]
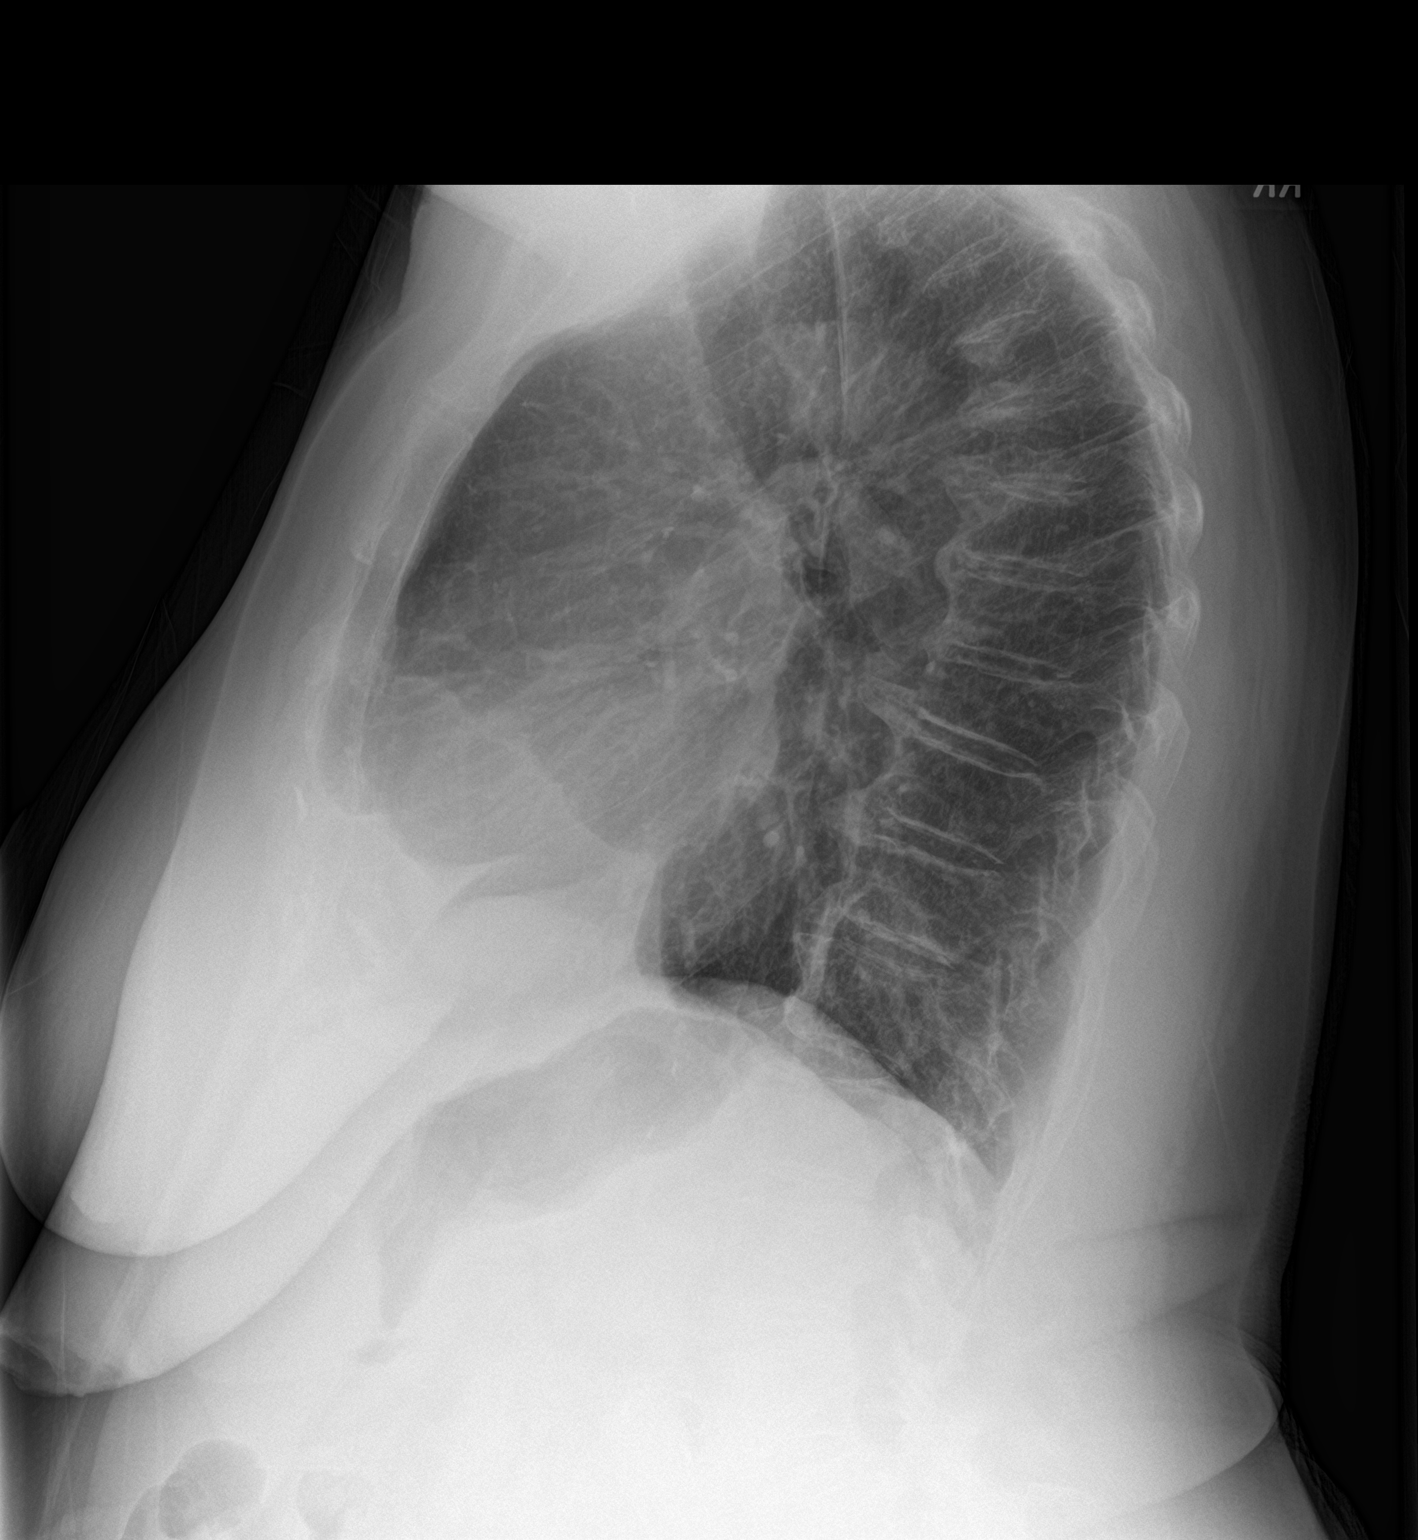

[2 of 2 positions shown; findings below may reference images not displayed]

FINDINGS: Unchanged cardiomediastinal silhouette. Right medial basilar
atelectasis, similar prior exam. No new airspace disease. No large
pleural effusion. No visible pneumothorax. No acute osseous
abnormality. Bilateral shoulder degenerative changes. Thoracic
spondylosis.
IMPRESSION: Right medial basilar atelectasis, similar to prior exam. No new
airspace disease.

## 2023-12-04 ENCOUNTER — Ambulatory Visit

## 2023-12-04 VITALS — BP 122/78 | Ht 61.0 in | Wt 160.0 lb

## 2023-12-04 DIAGNOSIS — Z0000 Encounter for general adult medical examination without abnormal findings: Secondary | ICD-10-CM | POA: Diagnosis not present

## 2023-12-04 NOTE — Progress Notes (Signed)
 Because this visit was a virtual/telehealth visit,  certain criteria was not obtained, such a blood pressure, CBG if applicable, and timed get up and go. Any medications not marked as taking were not mentioned during the medication reconciliation part of the visit. Any vitals not documented were not able to be obtained due to this being a telehealth visit or patient was unable to self-report a recent blood pressure reading due to a lack of equipment at home via telehealth. Vitals that have been documented are verbally provided by the patient.  This visit was performed by a medical professional under my direct supervision. I was immediately available for consultation/collaboration. I have reviewed and agree with the Annual Wellness Visit documentation.  Subjective:   Beth Shepard is a 68 y.o. who presents for a Medicare Wellness preventive visit.  As a reminder, Annual Wellness Visits don't include a physical exam, and some assessments may be limited, especially if this visit is performed virtually. We may recommend an in-person follow-up visit with your provider if needed.  Visit Complete: Virtual I connected with  Beth Shepard on 12/04/23 by a audio enabled telemedicine application and verified that I am speaking with the correct person using two identifiers.  Patient Location: Home  Provider Location: Home Office  I discussed the limitations of evaluation and management by telemedicine. The patient expressed understanding and agreed to proceed.  Vital Signs: Because this visit was a virtual/telehealth visit, some criteria may be missing or patient reported. Any vitals not documented were not able to be obtained and vitals that have been documented are patient reported.  VideoDeclined- This patient declined Librarian, academic. Therefore the visit was completed with audio only.  Persons Participating in Visit: Patient.  AWV Questionnaire: No: Patient Medicare  AWV questionnaire was not completed prior to this visit.  Cardiac Risk Factors include: advanced age (>81men, >5 women);hypertension     Objective:     Today's Vitals   12/04/23 1604 12/04/23 1605  BP: 122/78   Weight: 160 lb (72.6 kg)   Height: 5' 1 (1.549 m)   PainSc:  5    Body mass index is 30.23 kg/m.     12/04/2023    4:03 PM 05/29/2023    3:59 PM 05/29/2022    2:50 PM 02/18/2019    3:26 PM 10/14/2015   10:33 AM  Advanced Directives  Does Patient Have a Medical Advance Directive? No No No No No  Would patient like information on creating a medical advance directive? No - Patient declined No - Patient declined No - Patient declined No - Patient declined No - patient declined information    Current Medications (verified) Outpatient Encounter Medications as of 12/04/2023  Medication Sig   ALPRAZolam  (XANAX ) 0.25 MG tablet Take 1 tablet (0.25 mg total) by mouth 3 (three) times daily as needed.   aspirin 81 MG tablet Take 81 mg by mouth daily.   blood glucose meter kit and supplies KIT Dispense based on patient and insurance preference. Use up to four times daily as directed.   Calcium  Carb-Cholecalciferol (CALCIUM -VITAMIN D ) 500-200 MG-UNIT tablet Take 1 tablet by mouth daily.   furosemide  (LASIX ) 20 MG tablet Take 1 tablet (20 mg total) by mouth daily.   pantoprazole  (PROTONIX ) 40 MG tablet TAKE 1 TABLET (40 MG TOTAL) BY MOUTH 2 (TWO) TIMES DAILY. TAKE TWICE DAILY FOR 8 WEEKS THEN DAILY   rosuvastatin  (CRESTOR ) 10 MG tablet TAKE 1 TABLET BY MOUTH EVERY DAY   spironolactone  (ALDACTONE )  25 MG tablet Take 1 tablet (25 mg total) by mouth every other day.   Facility-Administered Encounter Medications as of 12/04/2023  Medication   0.9 %  sodium chloride  infusion    Allergies (verified) Prednisone , Codeine, and Ibuprofen   History: Past Medical History:  Diagnosis Date   Anxiety    Bilateral edema of lower extremity    Swelling in legs and ankles   Congestive heart  failure (HCC)    COPD (chronic obstructive pulmonary disease) (HCC)    Diabetes mellitus    type 2   Gastric polyps    GERD (gastroesophageal reflux disease)    chronic   Hyperlipidemia    Hypertension    IBS (irritable bowel syndrome)    LBBB (left bundle branch block)    Dr. Keren Peasant, [facility]   Pneumonia    hx of   PONV (postoperative nausea and vomiting)    Had N/V after having tubal ligation and D&C   Rheumatoid arthritis (HCC)    Shingles    Past Surgical History:  Procedure Laterality Date   CARDIAC CATHETERIZATION     No PCI   CHOLECYSTECTOMY     COLONOSCOPY  06/06/2005   normal    DILATION AND CURETTAGE OF UTERUS     ESOPHAGOGASTRODUODENOSCOPY  06/06/2005, 11/17/2013   JOINT REPLACEMENT Right 10/24/2015   rt hip replacement   KNEE ARTHROSCOPY Left    TOTAL HIP ARTHROPLASTY Right 10/24/2015   Procedure: RIGHT TOTAL HIP ARTHROPLASTY ANTERIOR APPROACH;  Surgeon: Wes Hamman, MD;  Location: [facility] OR;  Service: Orthopedics;  Laterality: Right;   TUBAL LIGATION     VAGINAL HYSTERECTOMY     Family History  Problem Relation Age of Onset   Lymphoma Mother    Heart disease Mother        chf, cabg x6   Hypertension Mother    GER disease Mother    Hiatal hernia Mother    Gallstones Mother    Rheum arthritis Father    Dementia Father    Brain cancer Sister        brain stage 4   Heart disease Maternal Grandmother 75       MI   Heart disease Maternal Grandfather    Stroke Paternal Grandmother    Hypertension Paternal Grandmother    Stroke Paternal Grandfather    Hypertension Paternal Grandfather    Hypertension Daughter    Colon cancer Neg Hx    Esophageal cancer Neg Hx    Pancreatic cancer Neg Hx    Liver disease Neg Hx    Kidney disease Neg Hx    Stomach cancer Neg Hx    Rectal cancer Neg Hx    Social History   Socioeconomic History   Marital status: Widowed    Spouse name: Not on file   Number of children: 2   Years of education: Not on  file   Highest education level: Not on file  Occupational History   Occupation: Administive - retired    Associate Professor: [employer]  Tobacco Use   Smoking status: Former    Current packs/day: 0.00    Types: Cigarettes    Start date: 06/25/1969    Quit date: 06/25/2002    Years since quitting: 21.4   Smokeless tobacco: Never  Vaping Use   Vaping status: Never Used  Substance and Sexual Activity   Alcohol use: Yes    Comment: rare   Drug use: No   Sexual activity: Not Currently  Partners: Male  Other Topics Concern   Not on file  Social History Narrative   Limited exercise per cardiology   Right handed   Caffeine-2 cups daily   Lives alone, still driving, has gotten lost 1 time   Social Drivers of Corporate investment banker Strain: Low Risk  (12/04/2023)   Overall Financial Resource Strain (CARDIA)    Difficulty of Paying Living Expenses: Not very hard  Food Insecurity: No Food Insecurity (12/04/2023)   Hunger Vital Sign    Worried About Running Out of Food in the Last Year: Never true    Ran Out of Food in the Last Year: Never true  Transportation Needs: No Transportation Needs (12/04/2023)   PRAPARE - Administrator, Civil Service (Medical): No    Lack of Transportation (Non-Medical): No  Physical Activity: Insufficiently Active (12/04/2023)   Exercise Vital Sign    Days of Exercise per Week: 3 days    Minutes of Exercise per Session: 10 min  Stress: No Stress Concern Present (12/04/2023)   [employer] of Occupational Health - Occupational Stress Questionnaire    Feeling of Stress : Not at all  Social Connections: Moderately Integrated (12/04/2023)   Social Connection and Isolation Panel [NHANES]    Frequency of Communication with Friends and Family: More than three times a week    Frequency of Social Gatherings with Friends and Family: More than three times a week    Attends Religious Services: More than 4 times per year    Active Member of  [employer] or Organizations: Yes    Attends Banker Meetings: More than 4 times per year    Marital Status: Widowed    Tobacco Counseling Counseling given: Not Answered    Clinical Intake:  Pre-visit preparation completed: Yes  Pain : 0-10 Pain Score: 5  Pain Type: Chronic pain Pain Location: Knee Pain Orientation: Left Pain Descriptors / Indicators: Tender, Aching Pain Onset: Today Pain Frequency: Intermittent     BMI - recorded: 30.23 Nutritional Status: BMI > 30  Obese Nutritional Risks: None Diabetes: Yes CBG done?: No Did pt. bring in CBG monitor from home?: No  Lab Results  Component Value Date   HGBA1C 6.0 10/14/2023   HGBA1C 5.9 (H) 12/28/2022   HGBA1C 5.9 06/15/2022     How often do you need to have someone help you when you read instructions, pamphlets, or other written materials from your doctor or pharmacy?: 1 - Never  Interpreter Needed?: No  Information entered by :: Juliann Ochoa   Activities of Daily Living     12/04/2023    4:09 PM  In your present state of health, do you have any difficulty performing the following activities:  Hearing? 0  Vision? 0  Difficulty concentrating or making decisions? 0  Walking or climbing stairs? 0  Dressing or bathing? 0  Doing errands, shopping? 0  Preparing Food and eating ? N  Using the Toilet? N  In the past six months, have you accidently leaked urine? N  Do you have problems with loss of bowel control? N  Managing your Medications? N  Managing your Finances? N  Housekeeping or managing your Housekeeping? N    Patient Care Team: Crecencio Dodge, Candida Chalk, DO as PCP - General Cheek, Willian Harrow, MD as Referring Physician (Cardiology) Cecilie Coffee, RPH-CPP (Pharmacist)  I have updated your Care Teams any recent Medical Services you may have received from other providers in the past year.  Assessment:    This is a routine wellness examination for Beth Shepard.  Hearing/Vision  screen Hearing Screening - Comments:: Patient has some hearing difficulties on right side Vision Screening - Comments:: No vision issues    Goals Addressed             This Visit's Progress    Patient Stated       Patient declined       Depression Screen     12/04/2023    4:12 PM 10/02/2022    4:15 PM 09/18/2021    2:17 PM 07/06/2021   10:26 AM 01/29/2020   11:24 AM 12/17/2017    2:05 PM 11/13/2012    8:33 AM  PHQ 2/9 Scores  PHQ - 2 Score 0 1 0 0 0 0 1  PHQ- 9 Score 0          Fall Risk     12/04/2023    4:07 PM 10/14/2023    1:09 PM 07/23/2023   10:15 AM 10/02/2022    4:15 PM 09/18/2021    2:17 PM  Fall Risk   Falls in the past year? 0 0 1 1 0  Number falls in past yr: 0 0 0 0 0  Injury with Fall? 0 0 1 0 0  Risk for fall due to : No Fall Risks  Impaired balance/gait    Follow up Falls evaluation completed Falls evaluation completed Falls evaluation completed Falls evaluation completed     MEDICARE RISK AT HOME:  Medicare Risk at Home Any stairs in or around the home?: Yes If so, are there any without handrails?: No Home free of loose throw rugs in walkways, pet beds, electrical cords, etc?: Yes Adequate lighting in your home to reduce risk of falls?: Yes Life alert?: No Use of a cane, walker or w/c?: No Grab bars in the bathroom?: No Shower chair or bench in shower?: No Elevated toilet seat or a handicapped toilet?: No  TIMED UP AND GO:  Was the test performed?  No  Cognitive Function: 6CIT completed    04/03/2023    3:14 PM 01/29/2020    2:03 PM  MMSE - Mini Mental State Exam  Orientation to time 5 3  Orientation to Place 5 3  Registration 3   Attention/ Calculation 2 0  Recall 3 3  Language- name 2 objects 2 2  Language- repeat 1 1  Language- follow 3 step command 3 3  Language- read & follow direction 1 1  Write a sentence 1 1  Copy design 0 1  Total score 26         12/04/2023    4:07 PM  6CIT Screen  What Year? 0 points  What month? 0  points  What time? 0 points  Count back from 20 0 points  Months in reverse 0 points  Repeat phrase 0 points  Total Score 0 points    Immunizations Immunization History  Administered Date(s) Administered   Influenza Whole 03/17/2009   Influenza,inj,Quad PF,6+ Mos 09/07/2014, 04/09/2017   PFIZER(Purple Top)SARS-COV-2 Vaccination 03/18/2020, 04/08/2020   PPD Test 10/26/2015   Pneumococcal Conjugate-13 01/29/2020   Pneumococcal Polysaccharide-23 11/13/2012   Tdap 11/13/2012    Screening Tests Health Maintenance  Topic Date Due   Zoster Vaccines- Shingrix (1 of 2) Never done   DEXA SCAN  Never done   MAMMOGRAM  12/14/2018   Pneumonia Vaccine 31+ Years old (3 of 3 - PPSV23, PCV20 or PCV21) 03/25/2020   COVID-19 Vaccine (  3 - Pfizer risk series) 05/06/2020   OPHTHALMOLOGY EXAM  08/24/2022   DTaP/Tdap/Td (2 - Td or Tdap) 11/14/2022   INFLUENZA VACCINE  01/24/2024   HEMOGLOBIN A1C  04/14/2024   Colonoscopy  05/14/2024   Diabetic kidney evaluation - eGFR measurement  10/13/2024   Diabetic kidney evaluation - Urine ACR  10/13/2024   Medicare Annual Wellness (AWV)  12/03/2024   Hepatitis C Screening  Completed   HPV VACCINES  Aged Out   Meningococcal B Vaccine  Aged Out    Health Maintenance  Health Maintenance Due  Topic Date Due   Zoster Vaccines- Shingrix (1 of 2) Never done   DEXA SCAN  Never done   MAMMOGRAM  12/14/2018   Pneumonia Vaccine 31+ Years old (3 of 3 - PPSV23, PCV20 or PCV21) 03/25/2020   COVID-19 Vaccine (3 - Pfizer risk series) 05/06/2020   OPHTHALMOLOGY EXAM  08/24/2022   DTaP/Tdap/Td (2 - Td or Tdap) 11/14/2022   Health Maintenance Items Addressed:patient declined health maintenance  Additional Screening:  Vision Screening: Recommended annual ophthalmology exams for early detection of glaucoma and other disorders of the eye. Would you like a referral to an eye doctor? No    Dental Screening: Recommended annual dental exams for proper oral  hygiene  Community Resource Referral / Chronic Care Management: CRR required this visit?  No   CCM required this visit?  No   Plan:    I have personally reviewed and noted the following in the patient's chart:   Medical and social history Use of alcohol, tobacco or illicit drugs  Current medications and supplements including opioid prescriptions. Patient is not currently taking opioid prescriptions. Functional ability and status Nutritional status Physical activity Advanced directives List of other physicians Hospitalizations, surgeries, and ER visits in previous 12 months Vitals Screenings to include cognitive, depression, and falls Referrals and appointments  In addition, I have reviewed and discussed with patient certain preventive protocols, quality metrics, and best practice recommendations. A written personalized care plan for preventive services as well as general preventive health recommendations were provided to patient.   Freeda Jerry, [place]   12/04/2023   After Visit Summary: (MyChart) Due to this being a telephonic visit, the after visit summary with patients personalized plan was offered to patient via MyChart   Notes: Nothing significant to report at this time.

## 2023-12-04 NOTE — Patient Instructions (Signed)
 Beth Shepard , Thank you for taking time out of your busy schedule to complete your Annual Wellness Visit with me. I enjoyed our conversation and look forward to speaking with you again next year. I, as well as your care team,  appreciate your ongoing commitment to your health goals. Please review the following plan we discussed and let me know if I can assist you in the future. Your Game plan/ To Do List    Referrals: If you haven't heard from the office you've been referred to, please reach out to them at the phone provided.  None  Follow up Visits: Next Medicare AWV with our clinical staff: 12/09/2024   Have you seen your provider in the last 6 months (3 months if uncontrolled diabetes)? No Next Office Visit with your provider: N/a  Clinician Recommendations:  Aim for 30 minutes of exercise or brisk walking, 6-8 glasses of water, and 5 servings of fruits and vegetables each day.       This is a list of the screening recommended for you and due dates:  Health Maintenance  Topic Date Due   Zoster (Shingles) Vaccine (1 of 2) Never done   DEXA scan (bone density measurement)  Never done   Mammogram  12/14/2018   Pneumonia Vaccine (3 of 3 - PPSV23, PCV20 or PCV21) 03/25/2020   COVID-19 Vaccine (3 - Pfizer risk series) 05/06/2020   Eye exam for diabetics  08/24/2022   DTaP/Tdap/Td vaccine (2 - Td or Tdap) 11/14/2022   Flu Shot  01/24/2024   Hemoglobin A1C  04/14/2024   Colon Cancer Screening  05/14/2024   Yearly kidney function blood test for diabetes  10/13/2024   Yearly kidney health urinalysis for diabetes  10/13/2024   Medicare Annual Wellness Visit  12/03/2024   Hepatitis C Screening  Completed   HPV Vaccine  Aged Out   Meningitis B Vaccine  Aged Out    Advanced directives: (Declined) Advance directive discussed with you today. Even though you declined this today, please call our office should you change your mind, and we can give you the proper paperwork for you to fill  out. Advance Care Planning is important because it:  [x]  Makes sure you receive the medical care that is consistent with your values, goals, and preferences  [x]  It provides guidance to your family and loved ones and reduces their decisional burden about whether or not they are making the right decisions based on your wishes.  Follow the link provided in your after visit summary or read over the paperwork we have mailed to you to help you started getting your Advance Directives in place. If you need assistance in completing these, please reach out to us  so that we can help you!  See attachments for Preventive Care and Fall Prevention Tips.

## 2023-12-17 ENCOUNTER — Other Ambulatory Visit: Payer: Self-pay | Admitting: Family Medicine

## 2023-12-17 DIAGNOSIS — R911 Solitary pulmonary nodule: Secondary | ICD-10-CM

## 2023-12-23 ENCOUNTER — Ambulatory Visit (INDEPENDENT_AMBULATORY_CARE_PROVIDER_SITE_OTHER): Admitting: Family Medicine

## 2023-12-23 ENCOUNTER — Ambulatory Visit (HOSPITAL_BASED_OUTPATIENT_CLINIC_OR_DEPARTMENT_OTHER)
Admission: RE | Admit: 2023-12-23 | Discharge: 2023-12-23 | Disposition: A | Discharge status: Home / Self Care | Source: Ambulatory Visit | Attending: Family Medicine | Admitting: Family Medicine

## 2023-12-23 ENCOUNTER — Encounter: Payer: Self-pay | Admitting: Family Medicine

## 2023-12-23 ENCOUNTER — Ambulatory Visit: Payer: Self-pay | Admitting: Family Medicine

## 2023-12-23 VITALS — BP 118/78 | HR 80 | Temp 98.1°F | Resp 18 | Ht 61.0 in | Wt 165.2 lb

## 2023-12-23 DIAGNOSIS — J40 Bronchitis, not specified as acute or chronic: Secondary | ICD-10-CM | POA: Insufficient documentation

## 2023-12-23 DIAGNOSIS — R059 Cough, unspecified: Secondary | ICD-10-CM | POA: Diagnosis not present

## 2023-12-23 LAB — POC COVID19 BINAXNOW: SARS Coronavirus 2 Ag: NEGATIVE

## 2023-12-23 MED ORDER — PROMETHAZINE-DM 6.25-15 MG/5ML PO SYRP: 5.0000 mL | ORAL_SOLUTION | Freq: Four times a day (QID) | ORAL | 0 refills | Status: AC | PRN

## 2023-12-23 MED ORDER — AZITHROMYCIN 250 MG PO TABS: ORAL_TABLET | ORAL | 0 refills | Status: AC

## 2023-12-23 NOTE — Progress Notes (Signed)
 Established Patient Office Visit  Subjective   Patient ID: Beth Shepard, female    DOB: [birth year]  Age: 68 y.o. MRN: [id number]  Chief Complaint  Patient presents with   Hoarse    Sxs started wed last week, slight throat pain.     HPI Discussed the use of AI scribe software for clinical note transcription with the patient, who gave verbal consent to proceed.  History of Present Illness Beth Shepard is a 68 year old female who presents with persistent cough and runny nose.  She has been experiencing a persistent cough and rhinorrhea since Wednesday of last week. The cough is severe and frequent, significantly disrupting her daily activities. She recalls a possible slight fever on Thursday, which was one of the worst days for her symptoms. The symptoms have been fluctuating but have not resolved.  The coughing episodes are intense enough to cause urinary incontinence, necessitating frequent trips to the bathroom. No symptoms of a urinary tract infection, such as dysuria, are present. She attributes the frequent urination to the intensity of her coughing.  No sore throat or odynophagia. She reports swollen glands.   Patient Active Problem List   Diagnosis Date Noted   Traumatic injury of head 07/23/2023   Acute pain of right shoulder 07/23/2023   Rheumatoid arthritis involving multiple joints (HCC) 12/22/2021   Type 2 diabetes mellitus with diabetic peripheral angiopathy without gangrene, without long-term current use of insulin  (HCC) 12/22/2021   Hyperlipidemia 09/18/2021   Memory loss 09/08/2021   Thrush 09/08/2021   IBS (irritable bowel syndrome) 05/11/2020   Uncontrolled type 2 diabetes mellitus with hyperglycemia (HCC) 02/01/2020   Nausea without vomiting 01/27/2019   Preventative health care 12/18/2017   Infected sebaceous cyst of skin 07/19/2017   DCM (dilated cardiomyopathy) (HCC) 07/10/2017   Fibromyalgia 04/05/2017   Chronic pain syndrome 04/05/2017    Pain in both lower extremities 02/06/2017   Chronic venous insufficiency 02/06/2017   Cellulitis of lower extremity 08/28/2016   Pain in right ankle and joints of right foot 08/09/2016   Peripheral edema 07/10/2016   Trochanteric bursitis, right hip 05/03/2016   Chronic kidney disease (CKD) stage G3a/A1, moderately decreased glomerular filtration rate (GFR) between 45-59 mL/min/1.73 square meter and albuminuria creatinine ratio less than 30 mg/g (HCC) 04/07/2016   Primary osteoarthritis of right hip 10/24/2015   Hip joint replacement status 10/24/2015   Chest pain 08/25/2015   Right hip pain 08/25/2015   Chest pain, precordial 08/10/2015   Precordial pain 08/10/2015   Cardiomyopathy (HCC) 01/18/2015   Block, bundle branch, left 01/18/2015   Left bundle-branch block 01/18/2015   Diabetes (HCC) 01/18/2015   Nausea alone 12/17/2013   Alternating constipation and diarrhea 11/19/2013   Bloating 11/19/2013   Obesity (BMI 30-39.9) 10/21/2013   Multiple joint pain 11/13/2012   MYALGIA 08/29/2010   ABDOMINAL PAIN OTHER SPECIFIED SITE 08/22/2009   Osteoarthritis 03/17/2009   Cellulitis and abscess of face 12/17/2008   Disturbance of skin sensation 12/17/2008   Bronchitis 05/07/2008   Anxiety 12/04/2007   SINUSITIS, ACUTE NOS 12/19/2006   Diabetes mellitus, type II (HCC) 10/08/2006   Hyperlipidemia LDL goal <70 10/08/2006   Essential hypertension 10/08/2006   COPD (chronic obstructive pulmonary disease) (HCC) 10/08/2006   GERD 10/08/2006   Other postprocedural status(V45.89) 10/08/2006   Past Medical History:  Diagnosis Date   Anxiety    Bilateral edema of lower extremity    Swelling in legs and ankles   Congestive heart failure (HCC)  COPD (chronic obstructive pulmonary disease) (HCC)    Diabetes mellitus    type 2   Gastric polyps    GERD (gastroesophageal reflux disease)    chronic   Hyperlipidemia    Hypertension    IBS (irritable bowel syndrome)    LBBB (left bundle  branch block)    Dr. Shermon Counter, [facility]   Pneumonia    hx of   PONV (postoperative nausea and vomiting)    Had N/V after having tubal ligation and D&C   Rheumatoid arthritis (HCC)    Shingles    Past Surgical History:  Procedure Laterality Date   CARDIAC CATHETERIZATION     No PCI   CHOLECYSTECTOMY     COLONOSCOPY  06/06/2005   normal    DILATION AND CURETTAGE OF UTERUS     ESOPHAGOGASTRODUODENOSCOPY  06/06/2005, 11/17/2013   JOINT REPLACEMENT Right 10/24/2015   rt hip replacement   KNEE ARTHROSCOPY Left    TOTAL HIP ARTHROPLASTY Right 10/24/2015   Procedure: RIGHT TOTAL HIP ARTHROPLASTY ANTERIOR APPROACH;  Surgeon: Kay CHRISTELLA Cummins, MD;  Location: [facility] OR;  Service: Orthopedics;  Laterality: Right;   TUBAL LIGATION     VAGINAL HYSTERECTOMY     Social History   Tobacco Use   Smoking status: Former    Current packs/day: 0.00    Types: Cigarettes    Start date: 06/25/1969    Quit date: 06/25/2002    Years since quitting: 21.5   Smokeless tobacco: Never  Vaping Use   Vaping status: Never Used  Substance Use Topics   Alcohol use: Yes    Comment: rare   Drug use: No   Social History   Socioeconomic History   Marital status: Widowed    Spouse name: Not on file   Number of children: 2   Years of education: Not on file   Highest education level: Not on file  Occupational History   Occupation: Administive - retired    Associate Professor: [employer]  Tobacco Use   Smoking status: Former    Current packs/day: 0.00    Types: Cigarettes    Start date: 06/25/1969    Quit date: 06/25/2002    Years since quitting: 21.5   Smokeless tobacco: Never  Vaping Use   Vaping status: Never Used  Substance and Sexual Activity   Alcohol use: Yes    Comment: rare   Drug use: No   Sexual activity: Not Currently    Partners: Male  Other Topics Concern   Not on file  Social History Narrative   Limited exercise per cardiology   Right handed   Caffeine-2 cups daily    Lives alone, still driving, has gotten lost 1 time   Social Drivers of Corporate investment banker Strain: Low Risk  (12/04/2023)   Overall Financial Resource Strain (CARDIA)    Difficulty of Paying Living Expenses: Not very hard  Food Insecurity: No Food Insecurity (12/04/2023)   Hunger Vital Sign    Worried About Running Out of Food in the Last Year: Never true    Ran Out of Food in the Last Year: Never true  Transportation Needs: No Transportation Needs (12/04/2023)   PRAPARE - Administrator, Civil Service (Medical): No    Lack of Transportation (Non-Medical): No  Physical Activity: Insufficiently Active (12/04/2023)   Exercise Vital Sign    Days of Exercise per Week: 3 days    Minutes of Exercise per Session: 10 min  Stress: No  Stress Concern Present (12/04/2023)   [employer] of Occupational Health - Occupational Stress Questionnaire    Feeling of Stress : Not at all  Social Connections: Moderately Integrated (12/04/2023)   Social Connection and Isolation Panel    Frequency of Communication with Friends and Family: More than three times a week    Frequency of Social Gatherings with Friends and Family: More than three times a week    Attends Religious Services: More than 4 times per year    Active Member of [employer] or Organizations: Yes    Attends Banker Meetings: More than 4 times per year    Marital Status: Widowed  Intimate Partner Violence: Not At Risk (12/04/2023)   Humiliation, Afraid, Rape, and Kick questionnaire    Fear of Current or Ex-Partner: No    Emotionally Abused: No    Physically Abused: No    Sexually Abused: No   Family Status  Relation Name Status   Mother  Deceased at age 24   Father  Deceased at age 68       septic shock   Sister  Alive   MGM  Deceased at age 66       MI   MGF  Deceased   PGM  Deceased   PGF  Deceased   Daughter  Alive   Daughter  Alive   Neg Hx  (Not Specified)  No partnership data on file   Family  History  Problem Relation Age of Onset   Lymphoma Mother    Heart disease Mother        chf, cabg x6   Hypertension Mother    GER disease Mother    Hiatal hernia Mother    Gallstones Mother    Rheum arthritis Father    Dementia Father    Brain cancer Sister        brain stage 4   Heart disease Maternal Grandmother 40       MI   Heart disease Maternal Grandfather    Stroke Paternal Grandmother    Hypertension Paternal Grandmother    Stroke Paternal Grandfather    Hypertension Paternal Grandfather    Hypertension Daughter    Colon cancer Neg Hx    Esophageal cancer Neg Hx    Pancreatic cancer Neg Hx    Liver disease Neg Hx    Kidney disease Neg Hx    Stomach cancer Neg Hx    Rectal cancer Neg Hx    Allergies  Allergen Reactions   Prednisone  Palpitations and Other (See Comments)    Dose pack causes vomiting; Causes Gerd N/v/d N/v/d    Codeine Nausea Only and Palpitations    REACTION: palpitation  REACTION: palpitation     Ibuprofen Other (See Comments) and Rash    REACTION: questionable. Pt states that she can take this sometimes. GERD      Review of Systems  Constitutional:  Positive for malaise/fatigue. Negative for fever.  HENT:  Positive for congestion and sore throat. Negative for ear discharge, ear pain, nosebleeds and sinus pain.   Eyes:  Negative for blurred vision.  Respiratory:  Positive for cough. Negative for sputum production, shortness of breath and wheezing.   Cardiovascular:  Negative for chest pain, palpitations and leg swelling.  Gastrointestinal:  Negative for vomiting.  Musculoskeletal:  Negative for back pain.  Skin:  Negative for rash.  Neurological:  Negative for loss of consciousness and headaches.      Objective:     BP  118/78 (BP Location: Left Arm, Patient Position: Sitting, Cuff Size: Normal)   Pulse 80   Temp 98.1 F (36.7 C) (Oral)   Resp 18   Ht 5' 1 (1.549 m)   Wt 165 lb 3.2 oz (74.9 kg)   SpO2 96%   BMI 31.21 kg/m   BP Readings from Last 3 Encounters:  12/23/23 118/78  12/04/23 122/78  10/14/23 122/78   Wt Readings from Last 3 Encounters:  12/23/23 165 lb 3.2 oz (74.9 kg)  12/04/23 160 lb (72.6 kg)  10/14/23 159 lb 3.2 oz (72.2 kg)   SpO2 Readings from Last 3 Encounters:  12/23/23 96%  10/14/23 97%  08/08/23 96%      Physical Exam Vitals and nursing note reviewed.  Constitutional:      General: She is not in acute distress.    Appearance: Normal appearance. She is well-developed.  HENT:     Head: Normocephalic and atraumatic.     Nose: Congestion present. No nasal tenderness.     Right Sinus: No maxillary sinus tenderness or frontal sinus tenderness.     Left Sinus: No maxillary sinus tenderness or frontal sinus tenderness.   Eyes:     General: No scleral icterus.       Right eye: No discharge.        Left eye: No discharge.    Cardiovascular:     Rate and Rhythm: Normal rate and regular rhythm.     Heart sounds: No murmur heard. Pulmonary:     Effort: Pulmonary effort is normal. No respiratory distress.     Breath sounds: Normal breath sounds.   Musculoskeletal:        General: Normal range of motion.     Cervical back: Normal range of motion and neck supple.     Right lower leg: No edema.     Left lower leg: No edema.  Lymphadenopathy:     Cervical: Cervical adenopathy present.     Right cervical: Superficial cervical adenopathy present.     Left cervical: Superficial cervical adenopathy present.   Skin:    General: Skin is warm and dry.   Neurological:     Mental Status: She is alert and oriented to person, place, and time.   Psychiatric:        Mood and Affect: Mood normal.        Behavior: Behavior normal.        Thought Content: Thought content normal.        Judgment: Judgment normal.      No results found for any visits on 12/23/23.  Last CBC Lab Results  Component Value Date   WBC 7.3 10/14/2023   HGB 12.9 10/14/2023   HCT 39.1 10/14/2023   MCV  87.5 10/14/2023   MCH 28.8 12/28/2022   RDW 12.9 10/14/2023   PLT 244.0 10/14/2023   Last metabolic panel Lab Results  Component Value Date   GLUCOSE 102 (H) 10/14/2023   NA 138 10/14/2023   K 4.4 10/14/2023   CL 101 10/14/2023   CO2 28 10/14/2023   BUN 19 10/14/2023   CREATININE 1.08 10/14/2023   GFR 52.14 (L) 10/14/2023   CALCIUM  9.9 10/14/2023   PROT 7.4 10/14/2023   ALBUMIN 4.4 10/14/2023   LABGLOB 2.6 12/22/2021   AGRATIO 1.7 12/22/2021   BILITOT 0.6 10/14/2023   ALKPHOS 67 10/14/2023   AST 21 10/14/2023   ALT 12 10/14/2023   ANIONGAP 8 05/29/2022   Last lipids Lab Results  Component Value Date   CHOL 135 10/14/2023   HDL 61.60 10/14/2023   LDLCALC 57 10/14/2023   LDLDIRECT 148.0 09/11/2021   TRIG 82.0 10/14/2023   CHOLHDL 2 10/14/2023   Last hemoglobin A1c Lab Results  Component Value Date   HGBA1C 6.0 10/14/2023   Last thyroid  functions Lab Results  Component Value Date   TSH 2.27 07/06/2021   Last vitamin D  Lab Results  Component Value Date   VD25OH 16 (L) 08/29/2010   Last vitamin B12 and Folate Lab Results  Component Value Date   VITAMINB12 281 01/29/2020      The 10-year ASCVD risk score (Arnett DK, et al., 2019) is: 17.4%    Assessment & Plan:   Problem List Items Addressed This Visit       Unprioritized   Bronchitis - Primary   Relevant Medications   azithromycin  (ZITHROMAX ) 250 MG tablet   promethazine -dextromethorphan (PROMETHAZINE -DM) 6.25-15 MG/5ML syrup  Assessment and Plan Assessment & Plan Upper Respiratory Infection   She presents with a persistent cough and rhinorrhea since last Wednesday, with a possible low-grade fever on Thursday. Symptoms have fluctuated but not resolved. No pharyngitis is reported, but lymphadenopathy is present. The cough is severe enough to cause urinary incontinence. Upper respiratory infection is the primary diagnosis given the symptoms and duration. Perform nasal swab for further  evaluation.    No follow-ups on file.    Benyamin Jeff R Lowne Chase, DO

## 2023-12-23 NOTE — Patient Instructions (Signed)

## 2024-01-03 ENCOUNTER — Ambulatory Visit: Admitting: Family Medicine

## 2024-01-03 ENCOUNTER — Ambulatory Visit (INDEPENDENT_AMBULATORY_CARE_PROVIDER_SITE_OTHER): Admitting: Physician Assistant

## 2024-01-03 ENCOUNTER — Encounter: Payer: Self-pay | Admitting: Physician Assistant

## 2024-01-03 VITALS — BP 117/71 | HR 73 | Ht 61.0 in | Wt 166.4 lb

## 2024-01-03 DIAGNOSIS — H9193 Unspecified hearing loss, bilateral: Secondary | ICD-10-CM | POA: Diagnosis not present

## 2024-01-03 NOTE — Progress Notes (Signed)
      Established patient visit   Patient: Beth Shepard   DOB: 08-08-1955   68 y.o. Female  MRN: [id number] Visit Date: 01/03/2024  Today's healthcare provider: Manuelita Flatness, PA-C   Cc. Hearing loss  Subjective     Pt reports ear fullness/muffled sound in the right ear ongoing for several months. Pt and daughter present today report she has been unable to hear well for several months if not longer. Medications: Outpatient Medications Prior to Visit  Medication Sig   ALPRAZolam  (XANAX ) 0.25 MG tablet Take 1 tablet (0.25 mg total) by mouth 3 (three) times daily as needed.   aspirin 81 MG tablet Take 81 mg by mouth daily.   blood glucose meter kit and supplies KIT Dispense based on patient and insurance preference. Use up to four times daily as directed.   Calcium  Carb-Cholecalciferol (CALCIUM -VITAMIN D ) 500-200 MG-UNIT tablet Take 1 tablet by mouth daily.   furosemide  (LASIX ) 20 MG tablet Take 1 tablet (20 mg total) by mouth daily.   pantoprazole  (PROTONIX ) 40 MG tablet TAKE 1 TABLET (40 MG TOTAL) BY MOUTH 2 (TWO) TIMES DAILY. TAKE TWICE DAILY FOR 8 WEEKS THEN DAILY   promethazine -dextromethorphan (PROMETHAZINE -DM) 6.25-15 MG/5ML syrup Take 5 mLs by mouth 4 (four) times daily as needed.   rosuvastatin  (CRESTOR ) 10 MG tablet TAKE 1 TABLET BY MOUTH EVERY DAY   spironolactone  (ALDACTONE ) 25 MG tablet Take 1 tablet (25 mg total) by mouth every other day.   Facility-Administered Medications Prior to Visit  Medication Dose Route Frequency Provider   0.9 %  sodium chloride  infusion  500 mL Intravenous Once Federico Rosario BROCKS, MD    Review of Systems  Constitutional:  Negative for fatigue and fever.  HENT:  Positive for hearing loss.   Respiratory:  Negative for cough and shortness of breath.   Cardiovascular:  Negative for chest pain and leg swelling.  Gastrointestinal:  Negative for abdominal pain.  Neurological:  Negative for dizziness and headaches.       Objective    BP 117/71    Pulse 73   Ht 5' 1 (1.549 m)   Wt 166 lb 6.4 oz (75.5 kg)   BMI 31.44 kg/m    Physical Exam Vitals reviewed.  Constitutional:      Appearance: She is not ill-appearing.  HENT:     Head: Normocephalic.     Right Ear: Tympanic membrane normal.     Left Ear: Tympanic membrane normal.  Eyes:     Conjunctiva/sclera: Conjunctivae normal.  Cardiovascular:     Rate and Rhythm: Normal rate.  Pulmonary:     Effort: Pulmonary effort is normal. No respiratory distress.  Neurological:     Mental Status: She is alert and oriented to person, place, and time.  Psychiatric:        Mood and Affect: Mood normal.        Behavior: Behavior normal.      No results found for any visits on 01/03/24.  Assessment & Plan    Bilateral hearing loss, unspecified hearing loss type -     Ambulatory referral to Audiology   Canals are clear. TM normal. Gross hearing tested in office, definitely diminished, but bilaterally.  Referring to ent/audiology  Return if symptoms worsen or fail to improve.       Manuelita Flatness, PA-C  [facility] [phone] (phone) [phone] (fax)  [facility]

## 2024-02-05 ENCOUNTER — Ambulatory Visit: Admitting: Podiatry

## 2024-02-05 DIAGNOSIS — L299 Pruritus, unspecified: Secondary | ICD-10-CM | POA: Diagnosis not present

## 2024-02-05 DIAGNOSIS — B351 Tinea unguium: Secondary | ICD-10-CM | POA: Diagnosis not present

## 2024-02-05 DIAGNOSIS — M79674 Pain in right toe(s): Secondary | ICD-10-CM

## 2024-02-05 DIAGNOSIS — M79675 Pain in left toe(s): Secondary | ICD-10-CM | POA: Diagnosis not present

## 2024-02-05 MED ORDER — BETAMETHASONE DIPROPIONATE 0.05 % EX CREA: TOPICAL_CREAM | Freq: Every day | CUTANEOUS | 2 refills | Status: AC

## 2024-02-05 NOTE — Progress Notes (Signed)
 Subjective:  Patient ID: Beth Shepard, female    DOB: August 28, 1955,  MRN: [id number]  Beth Shepard presents to clinic today for:  Chief Complaint  Patient presents with   [facility]    A1c was 6.0 in April, takes ASA. [facility] with out callous.    Patient notes nails are thick, discolored, elongated and painful in shoegear when trying to ambulate.  She notes that when her veins dilate she will sometimes experience itching around her ankles and feet.  She would like the sample of Skintegrity cream that was provided at previous visit.  PCP is Antonio Meth, [place] R, DO.  Past Medical History:  Diagnosis Date   Anxiety    Bilateral edema of lower extremity    Swelling in legs and ankles   Congestive heart failure (HCC)    COPD (chronic obstructive pulmonary disease) (HCC)    Diabetes mellitus    type 2   Gastric polyps    GERD (gastroesophageal reflux disease)    chronic   Hyperlipidemia    Hypertension    IBS (irritable bowel syndrome)    LBBB (left bundle branch block)    Dr. Shermon Counter, [facility]   Pneumonia    hx of   PONV (postoperative nausea and vomiting)    Had N/V after having tubal ligation and D&C   Rheumatoid arthritis (HCC)    Shingles    Past Surgical History:  Procedure Laterality Date   CARDIAC CATHETERIZATION     No PCI   CHOLECYSTECTOMY     COLONOSCOPY  06/06/2005   normal    DILATION AND CURETTAGE OF UTERUS     ESOPHAGOGASTRODUODENOSCOPY  06/06/2005, 11/17/2013   JOINT REPLACEMENT Right 10/24/2015   rt hip replacement   KNEE ARTHROSCOPY Left    TOTAL HIP ARTHROPLASTY Right 10/24/2015   Procedure: RIGHT TOTAL HIP ARTHROPLASTY ANTERIOR APPROACH;  Surgeon: Kay CHRISTELLA Cummins, MD;  Location: [facility] OR;  Service: Orthopedics;  Laterality: Right;   TUBAL LIGATION     VAGINAL HYSTERECTOMY     Allergies  Allergen Reactions   Prednisone  Palpitations and Other (See Comments)    Dose pack causes vomiting; Causes Gerd N/v/d N/v/d    Codeine Nausea Only and  Palpitations    REACTION: palpitation  REACTION: palpitation     Ibuprofen Other (See Comments) and Rash    REACTION: questionable. Pt states that she can take this sometimes. GERD    Review of Systems: Negative except as noted in the HPI.  Objective:  Beth Shepard is a pleasant 68 y.o. female in NAD. AAO x 3.  Vascular Examination: Capillary refill time is 3-5 seconds to toes bilateral. Palpable pedal pulses b/l LE. Digital hair sparse b/l.  Skin temperature gradient WNL b/l.  Multiple small telangiectasias near the ankles and feet.  No edema noted.  Dermatological Examination: Pedal skin with normal turgor, texture and tone b/l. No open wounds. No interdigital macerations b/l. Toenails x10 are 3mm thick, discolored, dystrophic with subungual debris. There is pain with compression of the nail plates.  They are elongated x10     Latest Ref Rng & Units 10/14/2023    1:54 PM  Hemoglobin A1C  Hemoglobin-A1c 4.6 - 6.5 % 6.0    Assessment/Plan: 1. Pain due to onychomycosis of toenails of both feet   2. Pruritus     Meds ordered this encounter  Medications   betamethasone  dipropionate 0.05 % cream    Sig: Apply topically daily. Apply once daily  as needed for itching skin around the ankles and feet.    Dispense:  45 g    Refill:  2   Discussed how the veins can dilate and cause a histamine release which will result in itching.  I will send in a prescription for steroid cream to apply once daily as needed for the itching.  She should continue with compression knee-high's.  The mycotic toenails were sharply debrided x10 with sterile nail nippers and a power debriding burr to decrease bulk/thickness and length.    Return in about 3 months (around 05/07/2024) for [facility].   Awanda CHARM Imperial, DPM, FACFAS [facility]     [address] [place], [place] [address]                Office [phone]  Fax [phone]

## 2024-02-07 ENCOUNTER — Ambulatory Visit (HOSPITAL_BASED_OUTPATIENT_CLINIC_OR_DEPARTMENT_OTHER)
Admission: RE | Admit: 2024-02-07 | Discharge: 2024-02-07 | Disposition: A | Discharge status: Home / Self Care | Payer: Medicare HMO | Source: Ambulatory Visit | Attending: Family Medicine | Admitting: Family Medicine

## 2024-02-07 DIAGNOSIS — R918 Other nonspecific abnormal finding of lung field: Secondary | ICD-10-CM | POA: Diagnosis not present

## 2024-02-07 DIAGNOSIS — R911 Solitary pulmonary nodule: Secondary | ICD-10-CM | POA: Diagnosis not present

## 2024-02-07 DIAGNOSIS — I700 Atherosclerosis of aorta: Secondary | ICD-10-CM | POA: Diagnosis not present

## 2024-02-12 DIAGNOSIS — E1169 Type 2 diabetes mellitus with other specified complication: Secondary | ICD-10-CM | POA: Diagnosis not present

## 2024-02-12 DIAGNOSIS — E785 Hyperlipidemia, unspecified: Secondary | ICD-10-CM | POA: Diagnosis not present

## 2024-02-12 DIAGNOSIS — I447 Left bundle-branch block, unspecified: Secondary | ICD-10-CM | POA: Diagnosis not present

## 2024-02-12 DIAGNOSIS — N1831 Chronic kidney disease, stage 3a: Secondary | ICD-10-CM | POA: Diagnosis not present

## 2024-02-12 DIAGNOSIS — I420 Dilated cardiomyopathy: Secondary | ICD-10-CM | POA: Diagnosis not present

## 2024-02-12 DIAGNOSIS — I129 Hypertensive chronic kidney disease with stage 1 through stage 4 chronic kidney disease, or unspecified chronic kidney disease: Secondary | ICD-10-CM | POA: Diagnosis not present

## 2024-02-12 DIAGNOSIS — E1151 Type 2 diabetes mellitus with diabetic peripheral angiopathy without gangrene: Secondary | ICD-10-CM | POA: Diagnosis not present

## 2024-02-12 DIAGNOSIS — R600 Localized edema: Secondary | ICD-10-CM | POA: Diagnosis not present

## 2024-02-14 ENCOUNTER — Other Ambulatory Visit: Payer: Self-pay | Admitting: Family Medicine

## 2024-02-14 DIAGNOSIS — E1165 Type 2 diabetes mellitus with hyperglycemia: Secondary | ICD-10-CM

## 2024-02-17 NOTE — Telephone Encounter (Signed)
 Refill for Ozempic  denied. No longer on med list.

## 2024-02-23 ENCOUNTER — Ambulatory Visit: Payer: Self-pay | Admitting: Family Medicine

## 2024-02-23 DIAGNOSIS — R918 Other nonspecific abnormal finding of lung field: Secondary | ICD-10-CM

## 2024-03-06 ENCOUNTER — Ambulatory Visit (INDEPENDENT_AMBULATORY_CARE_PROVIDER_SITE_OTHER): Admitting: Audiology

## 2024-03-06 ENCOUNTER — Institutional Professional Consult (permissible substitution) (INDEPENDENT_AMBULATORY_CARE_PROVIDER_SITE_OTHER): Admitting: Physician Assistant

## 2024-03-10 DIAGNOSIS — I447 Left bundle-branch block, unspecified: Secondary | ICD-10-CM | POA: Diagnosis not present

## 2024-03-10 DIAGNOSIS — I10 Essential (primary) hypertension: Secondary | ICD-10-CM | POA: Diagnosis not present

## 2024-03-10 DIAGNOSIS — E1151 Type 2 diabetes mellitus with diabetic peripheral angiopathy without gangrene: Secondary | ICD-10-CM | POA: Diagnosis not present

## 2024-03-10 DIAGNOSIS — I420 Dilated cardiomyopathy: Secondary | ICD-10-CM | POA: Diagnosis not present

## 2024-03-16 ENCOUNTER — Emergency Department (HOSPITAL_BASED_OUTPATIENT_CLINIC_OR_DEPARTMENT_OTHER)
Admission: EM | Admit: 2024-03-16 | Discharge: 2024-03-16 | Disposition: A | Discharge status: Home / Self Care | Source: Ambulatory Visit | Attending: Emergency Medicine | Admitting: Emergency Medicine

## 2024-03-16 ENCOUNTER — Ambulatory Visit: Payer: Self-pay | Admitting: *Deleted

## 2024-03-16 ENCOUNTER — Emergency Department (HOSPITAL_BASED_OUTPATIENT_CLINIC_OR_DEPARTMENT_OTHER)

## 2024-03-16 ENCOUNTER — Other Ambulatory Visit: Payer: Self-pay

## 2024-03-16 ENCOUNTER — Encounter (HOSPITAL_BASED_OUTPATIENT_CLINIC_OR_DEPARTMENT_OTHER): Payer: Self-pay | Admitting: Emergency Medicine

## 2024-03-16 DIAGNOSIS — W19XXXA Unspecified fall, initial encounter: Secondary | ICD-10-CM

## 2024-03-16 DIAGNOSIS — N189 Chronic kidney disease, unspecified: Secondary | ICD-10-CM | POA: Insufficient documentation

## 2024-03-16 DIAGNOSIS — E1122 Type 2 diabetes mellitus with diabetic chronic kidney disease: Secondary | ICD-10-CM | POA: Diagnosis not present

## 2024-03-16 DIAGNOSIS — Z7982 Long term (current) use of aspirin: Secondary | ICD-10-CM | POA: Diagnosis not present

## 2024-03-16 DIAGNOSIS — Y9301 Activity, walking, marching and hiking: Secondary | ICD-10-CM | POA: Diagnosis not present

## 2024-03-16 DIAGNOSIS — M4856XA Collapsed vertebra, not elsewhere classified, lumbar region, initial encounter for fracture: Secondary | ICD-10-CM | POA: Diagnosis not present

## 2024-03-16 DIAGNOSIS — W1839XA Other fall on same level, initial encounter: Secondary | ICD-10-CM | POA: Insufficient documentation

## 2024-03-16 DIAGNOSIS — I10 Essential (primary) hypertension: Secondary | ICD-10-CM | POA: Diagnosis not present

## 2024-03-16 DIAGNOSIS — M545 Low back pain, unspecified: Secondary | ICD-10-CM | POA: Diagnosis present

## 2024-03-16 DIAGNOSIS — S32010A Wedge compression fracture of first lumbar vertebra, initial encounter for closed fracture: Secondary | ICD-10-CM | POA: Diagnosis not present

## 2024-03-16 DIAGNOSIS — I129 Hypertensive chronic kidney disease with stage 1 through stage 4 chronic kidney disease, or unspecified chronic kidney disease: Secondary | ICD-10-CM | POA: Diagnosis not present

## 2024-03-16 DIAGNOSIS — S32020A Wedge compression fracture of second lumbar vertebra, initial encounter for closed fracture: Secondary | ICD-10-CM

## 2024-03-16 DIAGNOSIS — M5136 Other intervertebral disc degeneration, lumbar region with discogenic back pain only: Secondary | ICD-10-CM | POA: Diagnosis not present

## 2024-03-16 DIAGNOSIS — J449 Chronic obstructive pulmonary disease, unspecified: Secondary | ICD-10-CM | POA: Diagnosis not present

## 2024-03-16 DIAGNOSIS — Z043 Encounter for examination and observation following other accident: Secondary | ICD-10-CM | POA: Diagnosis not present

## 2024-03-16 DIAGNOSIS — S32028A Other fracture of second lumbar vertebra, initial encounter for closed fracture: Secondary | ICD-10-CM | POA: Diagnosis not present

## 2024-03-16 DIAGNOSIS — M47816 Spondylosis without myelopathy or radiculopathy, lumbar region: Secondary | ICD-10-CM | POA: Diagnosis not present

## 2024-03-16 MED ORDER — OXYCODONE HCL 5 MG PO TABS: 2.5000 mg | ORAL_TABLET | Freq: Four times a day (QID) | ORAL | 0 refills | Status: DC | PRN

## 2024-03-16 MED ORDER — LIDOCAINE 5 % EX PTCH
1.0000 | MEDICATED_PATCH | Freq: Once | CUTANEOUS | Status: DC
  Administered 2024-03-16: 1 via TRANSDERMAL
  Filled 2024-03-16: qty 1

## 2024-03-16 MED ORDER — OXYCODONE HCL 5 MG PO TABS
5.0000 mg | ORAL_TABLET | Freq: Once | ORAL | Status: AC
  Administered 2024-03-16: 5 mg via ORAL
  Filled 2024-03-16: qty 1

## 2024-03-16 MED ORDER — ACETAMINOPHEN 500 MG PO TABS
1000.0000 mg | ORAL_TABLET | Freq: Once | ORAL | Status: AC
  Administered 2024-03-16: 1000 mg via ORAL
  Filled 2024-03-16: qty 2

## 2024-03-16 MED ORDER — KETOROLAC TROMETHAMINE 15 MG/ML IJ SOLN
15.0000 mg | Freq: Once | INTRAMUSCULAR | Status: AC
  Administered 2024-03-16: 15 mg via INTRAMUSCULAR
  Filled 2024-03-16: qty 1

## 2024-03-16 MED ORDER — SENNOSIDES-DOCUSATE SODIUM 8.6-50 MG PO TABS: 1.0000 | ORAL_TABLET | Freq: Every day | ORAL | 0 refills | Status: AC | PRN

## 2024-03-16 NOTE — ED Provider Notes (Signed)
 [place] [facility] Provider Note   CSN: [id number] Arrival date & time: 03/16/24  1055     Patient presents with: Beth Shepard is a 68 y.o. female w/ hx of COPD, HTN, T2DM, OA, CKD, LBBB that pf fall.   This morning, pt was walking with her walker, got caught on something and she fell backwards. Denies LOC, feeling dizzy or weak. She fell and landed on her low back. She was unable to get up, so she called her daughter and they came and brought her to ED. Denies hitting head. Pain is primary in her lumbar back region, does not radiate down her legs. No urinary or stool incontinence.       Fall     Prior to Admission medications   Medication Sig Start Date End Date Taking? Authorizing Provider  oxyCODONE  (ROXICODONE ) 5 MG immediate release tablet Take 0.5 tablets (2.5 mg total) by mouth every 6 (six) hours as needed for severe pain (pain score 7-10). 03/16/24  Yes Elicia Hamlet, MD  senna-docusate (SENOKOT-S) 8.6-50 MG tablet Take 1 tablet by mouth daily as needed for mild constipation (Take if you are taking oxycodone .). 03/16/24  Yes Elicia Hamlet, MD  ALPRAZolam  (XANAX ) 0.25 MG tablet Take 1 tablet (0.25 mg total) by mouth 3 (three) times daily as needed. 10/07/23   Antonio Cyndee Jamee JONELLE, DO  aspirin 81 MG tablet Take 81 mg by mouth daily.    [provider]  betamethasone  dipropionate 0.05 % cream Apply topically daily. Apply once daily as needed for itching skin around the ankles and feet. 02/05/24   McCaughan, Dia D, DPM  blood glucose meter kit and supplies KIT Dispense based on patient and insurance preference. Use up to four times daily as directed. 09/18/21   Antonio Cyndee Jamee JONELLE, DO  Calcium  Carb-Cholecalciferol (CALCIUM -VITAMIN D ) 500-200 MG-UNIT tablet Take 1 tablet by mouth daily.    [provider]  furosemide  (LASIX ) 20 MG tablet Take 1 tablet (20 mg total) by mouth daily. 12/17/22   Antonio Cyndee Jamee R, DO   pantoprazole  (PROTONIX ) 40 MG tablet TAKE 1 TABLET (40 MG TOTAL) BY MOUTH 2 (TWO) TIMES DAILY. TAKE TWICE DAILY FOR 8 WEEKS THEN DAILY 10/04/23   Federico Rosario BROCKS, MD  promethazine -dextromethorphan (PROMETHAZINE -DM) 6.25-15 MG/5ML syrup Take 5 mLs by mouth 4 (four) times daily as needed. 12/23/23   Antonio Cyndee Jamee R, DO  rosuvastatin  (CRESTOR ) 10 MG tablet TAKE 1 TABLET BY MOUTH EVERY DAY 09/09/23   Antonio Cyndee, Yvonne R, DO  spironolactone  (ALDACTONE ) 25 MG tablet Take 1 tablet (25 mg total) by mouth every other day. 10/14/23   Antonio Cyndee Jamee JONELLE, DO    Allergies: Prednisone , Codeine, and Ibuprofen    Review of Systems  Updated Vital Signs BP (!) 143/62 (BP Location: Left Arm)   Pulse 73   Temp 97.9 F (36.6 C) (Oral)   Resp 17   Wt 77.1 kg   SpO2 98%   BMI 32.12 kg/m   Physical Exam General: Alert, pleasant woman laying in bed. Speaking comfortably in full sentences on RA. NAD. Neuro: Wiggles toes, Sensation intact and equal in BL LE. Strength 5/5 and symmetric in BL LE. Able to ambulate.  HEENT: NCAT. MMM. CV: RRR, no murmurs.   Resp: CTAB, no wheezing or crackles. Normal WOB on RA.  Abm: Soft, nontender, nondistended. BS present.  Skin: Warm, well perfused  MSK: TTP along paraspinal region of lumbar spine.  No point tenderness.  (all labs ordered are listed, but only abnormal results are displayed) Labs Reviewed - No data to display  EKG: EKG Interpretation Date/Time:  Monday March 16 2024 11:55:45 EDT Ventricular Rate:  68 PR Interval:  173 QRS Duration:  152 QT Interval:  479 QTC Calculation: 510 R Axis:   38  Text Interpretation: Sinus rhythm IVCD, consider atypical LBBB Confirmed by Darra Chew [phone]) on 03/16/2024 11:57:19 AM  Radiology: CT Lumbar Spine Wo Contrast Result Date: 03/16/2024 CLINICAL DATA:  Low back pain after a fall EXAM: CT LUMBAR SPINE WITHOUT CONTRAST TECHNIQUE: Multidetector CT imaging of the lumbar spine was performed without  intravenous contrast administration. Multiplanar CT image reconstructions were also generated. RADIATION DOSE REDUCTION: This exam was performed according to the departmental dose-optimization program which includes automated exposure control, adjustment of the mA and/or kV according to patient size and/or use of iterative reconstruction technique. COMPARISON:  Multiple exams, including 04/29/2023 and overlapping portion of chest CT from 02/07/2024 FINDINGS: Segmentation: The lowest lumbar type non-rib-bearing vertebra is labeled as L5. Alignment: 4 mm degenerative anterolisthesis at L3-4 and 9 mm degenerative anterolisthesis at L4-5, not changed from 06/28/2022. Vertebrae: Acute 30% inferior endplate compression fracture at L2 with involvement of the posterior vertebral body compatible with middle column involvement. 2 mm posterior bony retropulsion. Paraspinal and other soft tissues: Atherosclerosis is present, including aortoiliac atherosclerotic disease. Disc levels: L1-2: Unremarkable L2-3: No impingement. L3-4: Moderate to prominent central stenosis and likely moderate bilateral subarticular lateral recess stenosis due to disc uncovering, disc bulge, and facet arthropathy at with ligamentum flavum redundancy. Borderline right foraminal stenosis. L4-5: Moderate to prominent central stenosis with moderate to prominent left and moderate right foraminal stenosis due to disc uncovering, disc bulge, and facet arthropathy. L5-S1: Borderline left foraminal stenosis due to facet arthropathy and disc osteophyte complex. IMPRESSION: 1. Acute 30% inferior endplate compression fracture at L2 with involvement of the posterior vertebral body compatible with middle column involvement. 2 mm posterior bony retropulsion. 2. Lumbar spondylosis and degenerative disc disease causing moderate to prominent impingement at L3-4 and L4-5. 3.  Aortic Atherosclerosis (ICD10-I70.0). Electronically Signed   By: Ryan Salvage M.D.   On:  03/16/2024 12:29     Procedures   Medications Ordered in the ED  lidocaine  (LIDODERM ) 5 % 1 patch (1 patch Transdermal Patch Applied 03/16/24 1159)  acetaminophen  (TYLENOL ) tablet 1,000 mg (1,000 mg Oral Given 03/16/24 1159)  ketorolac  (TORADOL ) 15 MG/ML injection 15 mg (15 mg Intramuscular Given 03/16/24 1353)  oxyCODONE  (Oxy IR/ROXICODONE ) immediate release tablet 5 mg (5 mg Oral Given 03/16/24 1353)                                Medical Decision Making:   Beth Shepard is a 68 y.o. female who presented to the ED today with fall.  Reviewed and confirmed nursing documentation for past medical history, family history, social history.    Initial Assessment:   Differential for fall includes mechanical, syncope, orthostatics. Mechanical is most likely given walker catching right before fall. Less likely syncope or orthostasis given no LOC, no preceding dizziness/lightheadedness.   Given age, will check CT lumbar to eval for potential fractures. Pt LE are neurovascularly intact and no point tenderness on exam, which makes fracture less likely. No anticoagulant medications.   Initial Plan:  Tylenol  and lidocaine  patch for pain control CT lumbar spine EKG to evaluate for cardiac pathology Objective  evaluation as below reviewed   Initial Study Results:   Laboratory  All laboratory results reviewed without evidence of clinically relevant pathology.     EKG EKG was reviewed independently. Rate, rhythm, axis, intervals all examined and without medically relevant abnormality. ST segments without concerns for elevations.   - Stable compared to prior  Radiology:  All images reviewed independently. Agree with radiology report at this time.   CT Lumbar Spine Wo Contrast Result Date: 03/16/2024 CLINICAL DATA:  Low back pain after a fall EXAM: CT LUMBAR SPINE WITHOUT CONTRAST TECHNIQUE: Multidetector CT imaging of the lumbar spine was performed without intravenous contrast administration.  Multiplanar CT image reconstructions were also generated. RADIATION DOSE REDUCTION: This exam was performed according to the departmental dose-optimization program which includes automated exposure control, adjustment of the mA and/or kV according to patient size and/or use of iterative reconstruction technique. COMPARISON:  Multiple exams, including 04/29/2023 and overlapping portion of chest CT from 02/07/2024 FINDINGS: Segmentation: The lowest lumbar type non-rib-bearing vertebra is labeled as L5. Alignment: 4 mm degenerative anterolisthesis at L3-4 and 9 mm degenerative anterolisthesis at L4-5, not changed from 06/28/2022. Vertebrae: Acute 30% inferior endplate compression fracture at L2 with involvement of the posterior vertebral body compatible with middle column involvement. 2 mm posterior bony retropulsion. Paraspinal and other soft tissues: Atherosclerosis is present, including aortoiliac atherosclerotic disease. Disc levels: L1-2: Unremarkable L2-3: No impingement. L3-4: Moderate to prominent central stenosis and likely moderate bilateral subarticular lateral recess stenosis due to disc uncovering, disc bulge, and facet arthropathy at with ligamentum flavum redundancy. Borderline right foraminal stenosis. L4-5: Moderate to prominent central stenosis with moderate to prominent left and moderate right foraminal stenosis due to disc uncovering, disc bulge, and facet arthropathy. L5-S1: Borderline left foraminal stenosis due to facet arthropathy and disc osteophyte complex. IMPRESSION: 1. Acute 30% inferior endplate compression fracture at L2 with involvement of the posterior vertebral body compatible with middle column involvement. 2 mm posterior bony retropulsion. 2. Lumbar spondylosis and degenerative disc disease causing moderate to prominent impingement at L3-4 and L4-5. 3.  Aortic Atherosclerosis (ICD10-I70.0). Electronically Signed   By: Ryan Salvage M.D.   On: 03/16/2024 12:29    Reassessment and  Plan:    1240: CT scan notable for L2 compression fracture.  - TLSO brace ordered  - Added oxycodone  5mg  and toradol  15mg  for pain control.    - Pt stable for discharge home with pain control and Neursurgery f/u. - Advised to follow-up with Neurosurgery outpatient.  - Ibuprofen 600mg  BID x7days, then prn - Tylenol  prn and lidocaine  patch prn for additional pain control - Oxycodone  2.5mg  q6h prn for pain (provided 10 doses). Encouraged to take senna-colace to prevent constipation.    Final diagnoses:  Fall, initial encounter  Closed compression fracture of L2 lumbar vertebra, initial encounter [facility])    ED Discharge Orders          Ordered    oxyCODONE  (ROXICODONE ) 5 MG immediate release tablet  Every 6 hours PRN        03/16/24 1344    senna-docusate (SENOKOT-S) 8.6-50 MG tablet  Daily PRN        03/16/24 1357             Elicia Hamlet, MD 03/16/24 1359    Darra Fonda MATSU, MD 03/17/24 [phone]

## 2024-03-16 NOTE — ED Notes (Signed)
 TSLO person is here to apply brace

## 2024-03-16 NOTE — Discharge Instructions (Addendum)
 You have a compression fracture of your L2 vertebrae. We have you fitted for a brace to help put pressure off the injury.   - Take ibuprofen 600mg  twice a day for the next 7 days. Afterwards, take it only as needed.  - You can take tylenol  1000mg  every 8 hours as needed for additional pain control - You can apply a lidocaine  patch to your lower back to help control pain as well.  - You can take oxycodone  2.5mg  (half a tablet) every 6 hours as needed for pain. This can make you drowsy or confused. Take only if needed. Take with miralax   - Please make an appointment with Neurosurgery in 2 weeks. They can assess the injury and see if further steps are needed.

## 2024-03-16 NOTE — ED Triage Notes (Signed)
 Fell thsi morning while she was walking with her Ambulator assistance . Fell backward , back pain , denies head injury .  Alert and oriented x 4 . Ambulated with her walker to triage with no obvious distress .

## 2024-03-16 NOTE — Progress Notes (Signed)
 Orthopedic Tech Progress Note Patient Details:  Beth Shepard 1956-02-21 [id number]  Called in stat order to [facility] for a TLSO BRACE Patient ID: Beth Shepard, female   DOB: 05-20-1956, 68 y.o.   MRN: [id number]  Beth Shepard 03/16/2024, 1:17 PM

## 2024-03-16 NOTE — ED Notes (Addendum)
 called [facility] ortho tech for TLSO Brace .

## 2024-03-16 NOTE — Telephone Encounter (Signed)
 FYI Only or Action Required?: FYI only for provider.  Patient was last seen in primary care on 01/03/2024 by Cyndi Shaver, PA-C.  Called Nurse Triage reporting Fall.  Symptoms began today.  Interventions attempted: Rest, hydration, or home remedies.  Symptoms are: gradually worsening.  Triage Disposition: Go to ED Now (or PCP Triage)  Patient/caregiver understands and will follow disposition?: Unsure   Patient daughter Warren is going to take patient to [facility] ED instead of [facility]. Patient will not go to [facility] ED.            Copied from CRM #[id number]. Topic: Clinical - Red Word Triage >> Mar 16, 2024  9:06 AM Charlet HERO wrote: Red Word that prompted transfer to Nurse Triage: Patient is calling bc she fell on her rear this morning and she is stating that she is in severe pain. Reason for Disposition  [1] Unable to get up until help (e.g., caregiver, family, friend) arrived AND [2] on the ground 1 hour or more  Answer Assessment - Initial Assessment Questions Recommended ED now . Patient daughter Warren on phone at end of triage with patient and reports she will take patient to ED at [facility] not [place].       . MECHANISM: How did the fall happen?     Using walker past few days.  In bed, woke up to go to bathroom. Loss balance tripped or something and fell on rear.  2. DOMESTIC VIOLENCE AND ELDER ABUSE SCREENING: Did you fall because someone pushed you or tried to hurt you? If Yes, ask: Are you safe now?     na 3. ONSET: When did the fall happen? (e.g., minutes, hours, or days ago)     This am  4. LOCATION: What part of the body hit the ground? (e.g., back, buttocks, head, hips, knees, hands, head, stomach)     Buttocks  5. INJURY: Did you hurt (injure) yourself when you fell? If Yes, ask: What did you injure? Tell me more about this? (e.g., body area; type of injury; pain severity)     Yes severe pain now  6. PAIN: Is there any pain? If  Yes, ask: How bad is the pain? (e.g., Scale 0-10; or none, mild,      Severe pain above buttocks 7. SIZE: For cuts, bruises, or swelling, ask: How large is it? (e.g., inches or centimeters)      Na  8. PREGNANCY: Is there any chance you are pregnant? When was your last menstrual period?     Na  9. OTHER SYMPTOMS: Do you have any other symptoms? (e.g., dizziness, fever, weakness; new-onset or worsening).      Pain above buttocks severe after fall and could not get up until family arrived 49. CAUSE: What do you think caused the fall (or falling)? (e.g., dizzy spell, tripped)       Tripped  Protocols used: Falls and [facility]

## 2024-03-18 ENCOUNTER — Telehealth (HOSPITAL_BASED_OUTPATIENT_CLINIC_OR_DEPARTMENT_OTHER): Payer: Self-pay | Admitting: Emergency Medicine

## 2024-03-18 MED ORDER — OXYCODONE HCL 5 MG PO TABS: 2.5000 mg | ORAL_TABLET | Freq: Four times a day (QID) | ORAL | 0 refills | Status: AC | PRN

## 2024-03-18 NOTE — Telephone Encounter (Cosign Needed)
 Patient seen in the emergency department 03/16/2024 after a mechanical fall.  Was found at that time to have a compression fracture at L2 vertebrae.  Oxycodone  was sent into the pharmacy but the pharmacy needs a diagnosis code on the prescription in order to fill it.  Will try to refill prescription and notify patient.

## 2024-03-18 NOTE — ED Notes (Signed)
 Patients Daughter called and stated that she is unable to get the medicaiton prescribed for her mother due to [facility] .  [facility] and they stated they need the Diagnosis Code on the Prescription and resend it.   Gave to PA and he handled it.  Advised Patients Mother.

## 2024-03-23 ENCOUNTER — Ambulatory Visit: Payer: Self-pay

## 2024-03-23 NOTE — Telephone Encounter (Signed)
 FYI Only or Action Required?: FYI only for provider.  Patient was last seen in primary care on 01/03/2024 by Cyndi Shaver, PA-C.  Called Nurse Triage reporting Leg Pain.  Symptoms began a week ago.  Interventions attempted: Nothing.  Symptoms are: gradually worsening.  Triage Disposition: See PCP When Office is Open (Within 3 Days)  Patient/caregiver understands and will follow disposition?: Yes    Copied from CRM #[id number]. Topic: Clinical - Red Word Triage >> Mar 23, 2024 10:23 AM Rosina BIRCH wrote: Reason for CRM: patient daughter called stating the patient fell a week ago and she is still hurting bad at her ankles and the back of her leg She does not want to eat or drink and the patient daughter is having to her eat so she can take her medicine Reason for Disposition  [1] MODERATE pain (e.g., interferes with normal activities, limping) AND [2] present > 3 days  Answer Assessment - Initial Assessment Questions 1. ONSET: When did the pain start?      Had a fall a week ago and is still in pain, was seen in ER 2. LOCATION: Where is the pain located?      Left thigh 3. PAIN: How bad is the pain?    (Scale 1-10; or mild, moderate, severe)     moderate 4. WORK OR EXERCISE: Has there been any recent work or exercise that involved this part of the body?      Fall last week 5. CAUSE: What do you think is causing the leg pain?     fall 6. OTHER SYMPTOMS: Do you have any other symptoms? (e.g., chest pain, back pain, breathing difficulty, swelling, rash, fever, numbness, weakness)     L2 compression fx of back, weak, wont eat, or drink 7. PREGNANCY: Is there any chance you are pregnant? When was your last menstrual period?     na  Protocols used: Leg Pain-A-AH

## 2024-03-26 DIAGNOSIS — Z6829 Body mass index (BMI) 29.0-29.9, adult: Secondary | ICD-10-CM | POA: Diagnosis not present

## 2024-03-26 DIAGNOSIS — S32020A Wedge compression fracture of second lumbar vertebra, initial encounter for closed fracture: Secondary | ICD-10-CM | POA: Diagnosis not present

## 2024-03-27 ENCOUNTER — Inpatient Hospital Stay: Admitting: Family Medicine

## 2024-03-27 ENCOUNTER — Other Ambulatory Visit: Payer: Self-pay | Admitting: Family Medicine

## 2024-03-27 DIAGNOSIS — F419 Anxiety disorder, unspecified: Secondary | ICD-10-CM

## 2024-03-27 DIAGNOSIS — I10 Essential (primary) hypertension: Secondary | ICD-10-CM

## 2024-03-27 DIAGNOSIS — E785 Hyperlipidemia, unspecified: Secondary | ICD-10-CM

## 2024-03-27 MED ORDER — ROSUVASTATIN CALCIUM 10 MG PO TABS
10.0000 mg | ORAL_TABLET | Freq: Every day | ORAL | 0 refills | Status: AC
Start: 2024-03-27 — End: ?

## 2024-03-27 MED ORDER — ALPRAZOLAM 0.25 MG PO TABS
0.2500 mg | ORAL_TABLET | Freq: Three times a day (TID) | ORAL | 0 refills | Status: AC | PRN
Start: 2024-03-27 — End: ?

## 2024-03-27 NOTE — Telephone Encounter (Signed)
 Requesting: alprazolam  0.25mg   Contract: 10/14/23 UDS: 10/14/23 Last Visit: 12/23/23 Next Visit: None Last Refill: 10/07/23 #90 and 0RF   Please Advise

## 2024-03-27 NOTE — Telephone Encounter (Signed)
 Copied from CRM [id number]. Topic: Clinical - Medication Refill >> Mar 27, 2024  2:55 PM Paige D wrote: Medication:  ALPRAZolam  (XANAX ) 0.25 MG tablet   rosuvastatin  (CRESTOR ) 10 MG tablet   Has the patient contacted their pharmacy? Yes (Agent: If no, request that the patient contact the pharmacy for the refill. If patient does not wish to contact the pharmacy document the reason why and proceed with request.) (Agent: If yes, when and what did the pharmacy advise?)  This is the patient's preferred pharmacy:  [id number] [place],  - [address] [facility]  [id number] Phone: [phone] Fax: [phone]   Is this the correct pharmacy for this prescription? Yes If no, delete pharmacy and type the correct one.   Has the prescription been filled recently? No  Is the patient out of the medication? Yes  Has the patient been seen for an appointment in the last year OR does the patient have an upcoming appointment? Yes  Can we respond through MyChart? Yes  Agent: Please be advised that Rx refills may take up to 3 business days. We ask that you follow-up with your pharmacy.

## 2024-04-07 DIAGNOSIS — S32020A Wedge compression fracture of second lumbar vertebra, initial encounter for closed fracture: Secondary | ICD-10-CM | POA: Diagnosis not present

## 2024-04-07 DIAGNOSIS — M8448XA Pathological fracture, other site, initial encounter for fracture: Secondary | ICD-10-CM | POA: Diagnosis not present

## 2024-04-22 ENCOUNTER — Encounter: Payer: Self-pay | Admitting: Emergency Medicine

## 2024-04-22 ENCOUNTER — Ambulatory Visit: Admitting: Emergency Medicine

## 2024-04-22 VITALS — BP 106/68 | HR 86 | Temp 98.2°F | Ht 61.0 in | Wt 153.0 lb

## 2024-04-22 DIAGNOSIS — R911 Solitary pulmonary nodule: Secondary | ICD-10-CM | POA: Diagnosis not present

## 2024-04-22 NOTE — Progress Notes (Signed)
 Subjective:    Patient ID: Beth Shepard, female    DOB: [birth year], 68 y.o.   MRN: [id number]  HPI Discussed the use of AI scribe software for clinical note transcription with the patient, who gave verbal consent to proceed.  History of Present Illness Beth Shepard is a 68 year old female who presents with a right lower lobe opacity. She is accompanied by her daughter. She was referred by Dr. Antonio Meth for evaluation of a right lower lobe opacity.  A right lower lobe opacity was initially identified on a CT scan of the abdomen and pelvis on May 15, 2023, revealing a new speculated airspace opacity with surrounding micro nodular disease measuring up to 2.4 cm. A dedicated CT scan of the chest on August 01, 2023, confirmed a right lower lobe parabronchial parenchymal opacity with surrounding micro nodular changes, the largest being 8 mm, unchanged from the previous scan. A repeat CT on February 07, 2024, showed a slight internal increase in the nodular opacity.  She has a history of hypertension, diabetes, left bundle branch block, dilated cardiomyopathy, chronic kidney disease, and fibromyalgia. She is a former smoker, having quit 30 years ago, and reports no significant occupational or environmental exposures. There is no family history of lung cancer, although there is a family history of rheumatoid arthritis.  She currently denies cough and shortness of breath, but her daughter notes hoarseness and frequent throat clearing. However, she experiences hoarseness and throat clearing, which her daughter notes.  She recently suffered a fall resulting in a compression fracture of L2, for which she has been wearing a brace for about a month, with an expected total duration of three months.    Results  Imaging personally reviewed and interpreted by me  RADIOLOGY CT scan of abdomen and pelvis: New speculated airspace opacity in right lower lobe with surrounding micronodular  disease, measuring up to 2.4 cm (05/15/2023) CT scan of chest: No mediastinal adenopathy, right lower lobe parabronchial parenchymal opacity with surrounding micronodular changes, largest 8 mm, unchanged compared to previous CT (08/01/2023) CT scan of chest: Slight internal increase in nodular opacity in right lower lobe (02/07/2024)    Review of Systems As per HPI  Past Medical History:  Diagnosis Date   Anxiety    Bilateral edema of lower extremity    Swelling in legs and ankles   Congestive heart failure (HCC)    COPD (chronic obstructive pulmonary disease) (HCC)    Diabetes mellitus    type 2   Gastric polyps    GERD (gastroesophageal reflux disease)    chronic   Hyperlipidemia    Hypertension    IBS (irritable bowel syndrome)    LBBB (left bundle branch block)    Dr. Shermon Counter, [facility]   Pneumonia    hx of   PONV (postoperative nausea and vomiting)    Had N/V after having tubal ligation and D&C   Rheumatoid arthritis (HCC)    Shingles     Family History  Problem Relation Age of Onset   Lymphoma Mother    Heart disease Mother        chf, cabg x6   Hypertension Mother    GER disease Mother    Hiatal hernia Mother    Gallstones Mother    Rheum arthritis Father    Dementia Father    Brain cancer Sister        brain stage 4   Heart disease Maternal Grandmother 8  MI   Heart disease Maternal Grandfather    Stroke Paternal Grandmother    Hypertension Paternal Grandmother    Stroke Paternal Grandfather    Hypertension Paternal Grandfather    Hypertension Daughter    Colon cancer Neg Hx    Esophageal cancer Neg Hx    Pancreatic cancer Neg Hx    Liver disease Neg Hx    Kidney disease Neg Hx    Stomach cancer Neg Hx    Rectal cancer Neg Hx      Social History   Socioeconomic History   Marital status: Widowed    Spouse name: Not on file   Number of children: 2   Years of education: Not on file   Highest education level: Not on file   Occupational History   Occupation: Administive - retired    Associate Professor: [employer]  Tobacco Use   Smoking status: Former    Current packs/day: 0.00    Types: Cigarettes    Start date: 06/25/1969    Quit date: 06/25/2002    Years since quitting: 21.8   Smokeless tobacco: Never  Vaping Use   Vaping status: Never Used  Substance and Sexual Activity   Alcohol use: Yes    Comment: rare   Drug use: No   Sexual activity: Not Currently    Partners: Male  Other Topics Concern   Not on file  Social History Narrative   Limited exercise per cardiology   Right handed   Caffeine-2 cups daily   Lives alone, still driving, has gotten lost 1 time   Social Drivers of Corporate Investment Banker Strain: Low Risk  (12/04/2023)   Overall Financial Resource Strain (CARDIA)    Difficulty of Paying Living Expenses: Not very hard  Food Insecurity: No Food Insecurity (12/04/2023)   Hunger Vital Sign    Worried About Running Out of Food in the Last Year: Never true    Ran Out of Food in the Last Year: Never true  Transportation Needs: No Transportation Needs (12/04/2023)   PRAPARE - Administrator, Civil Service (Medical): No    Lack of Transportation (Non-Medical): No  Physical Activity: Insufficiently Active (12/04/2023)   Exercise Vital Sign    Days of Exercise per Week: 3 days    Minutes of Exercise per Session: 10 min  Stress: No Stress Concern Present (12/04/2023)   [employer] of Occupational Health - Occupational Stress Questionnaire    Feeling of Stress : Not at all  Social Connections: Moderately Integrated (12/04/2023)   Social Connection and Isolation Panel    Frequency of Communication with Friends and Family: More than three times a week    Frequency of Social Gatherings with Friends and Family: More than three times a week    Attends Religious Services: More than 4 times per year    Active Member of [employer] or Organizations: Yes    Attends Tax Inspector Meetings: More than 4 times per year    Marital Status: Widowed  Intimate Partner Violence: Not At Risk (12/04/2023)   Humiliation, Afraid, Rape, and Kick questionnaire    Fear of Current or Ex-Partner: No    Emotionally Abused: No    Physically Abused: No    Sexually Abused: No    Allergies  Allergen Reactions   Prednisone  Palpitations and Other (See Comments)    Dose pack causes vomiting; Causes Gerd N/v/d N/v/d    Codeine Nausea Only and Palpitations    REACTION:  palpitation  REACTION: palpitation     Ibuprofen Other (See Comments) and Rash    REACTION: questionable. Pt states that she can take this sometimes. GERD    Current Outpatient Medications on File Prior to Visit  Medication Sig Dispense Refill   aspirin 81 MG tablet Take 81 mg by mouth daily.     betamethasone  dipropionate 0.05 % cream Apply topically daily. Apply once daily as needed for itching skin around the ankles and feet. 45 g 2   blood glucose meter kit and supplies KIT Dispense based on patient and insurance preference. Use up to four times daily as directed. 1 each 0   Calcium  Carb-Cholecalciferol (CALCIUM -VITAMIN D ) 500-200 MG-UNIT tablet Take 1 tablet by mouth daily.     furosemide  (LASIX ) 20 MG tablet Take 1 tablet (20 mg total) by mouth daily. 90 tablet 0   pantoprazole  (PROTONIX ) 40 MG tablet TAKE 1 TABLET (40 MG TOTAL) BY MOUTH 2 (TWO) TIMES DAILY. TAKE TWICE DAILY FOR 8 WEEKS THEN DAILY 180 tablet 1   sacubitril-valsartan (ENTRESTO) 24-26 MG Take 1 tablet by mouth 2 (two) times daily.     ALPRAZolam  (XANAX ) 0.25 MG tablet Take 1 tablet (0.25 mg total) by mouth 3 (three) times daily as needed. (Patient not taking: Reported on 04/22/2024) 90 tablet 0   oxyCODONE  (ROXICODONE ) 5 MG immediate release tablet Take 0.5 tablets (2.5 mg total) by mouth every 6 (six) hours as needed for severe pain (pain score 7-10). (Patient not taking: Reported on 04/22/2024) 5 tablet 0    promethazine -dextromethorphan (PROMETHAZINE -DM) 6.25-15 MG/5ML syrup Take 5 mLs by mouth 4 (four) times daily as needed. (Patient not taking: Reported on 04/22/2024) 118 mL 0   rosuvastatin  (CRESTOR ) 10 MG tablet Take 1 tablet (10 mg total) by mouth daily. 90 tablet 0   senna-docusate (SENOKOT-S) 8.6-50 MG tablet Take 1 tablet by mouth daily as needed for mild constipation (Take if you are taking oxycodone .). 30 tablet 0   spironolactone  (ALDACTONE ) 25 MG tablet Take 1 tablet (25 mg total) by mouth every other day. 45 tablet 0   Current Facility-Administered Medications on File Prior to Visit  Medication Dose Route Frequency Provider Last Rate Last Admin   0.9 %  sodium chloride  infusion  500 mL Intravenous Once Federico Rosario BROCKS, MD           Objective:    Vitals:   04/22/24 1421  BP: 106/68  Pulse: 86  Temp: 98.2 F (36.8 C)  SpO2: 97%  Weight: 153 lb (69.4 kg)  Height: 5' 1 (1.549 m)   Physical Exam Gen: Pleasant, well-nourished, in no distress, somewhat stoic affect  ENT: No lesions,  mouth clear,  oropharynx clear, no postnasal drip  Neck: No JVD, no stridor  Lungs: No use of accessory muscles, no crackles or wheezing on normal respiration, no wheeze on forced expiration  Cardiovascular: RRR, heart sounds normal, no murmur or gallops, no peripheral edema  Musculoskeletal: No deformities, she is wearing a back brace  Neuro: alert, awake, non focal  Skin: Warm, no lesions or rash       Assessment & Plan:   Assessment & Plan Pulmonary nodule 1 cm or greater in diameter   Assessment and Plan Assessment & Plan Right lower lobe pulmonary nodule with interval growth Right lower lobe opacity with surrounding micro nodular changes, largest 8 mm, with slight internal increase. Differential includes scar, inflammation, or early lung cancer. Smoking history considered. - Order repeat CT scan of the chest  in mid-November 2025 to assess interval changes. - Consider PET scan  if CT shows changes to assess metabolic activity. - Consider bronchoscopy if nodule grows or is active on PET scan.   Return in about 1 month (around 05/23/2024) for With Dr. Shelah, To review CT scan of the chest.   Lamar Shelah, MD, PhD 04/22/2024, 2:55 PM  Pulmonary [facility] Care [phone] or if no answer before 7:00PM call [phone] For any issues after 7:00PM please call eLink [phone]

## 2024-04-22 NOTE — Patient Instructions (Signed)
  VISIT SUMMARY: Today, you were seen for a right lower lobe opacity in your lung. This was initially found on a CT scan and has shown some changes over time.   YOUR PLAN: -RIGHT LOWER LOBE PULMONARY NODULE WITH INTERVAL GROWTH: A right lower lobe pulmonary nodule is a small, abnormal growth in the lower part of your right lung. It can be caused by various factors, including scarring, inflammation, or early lung cancer. Given your history and the changes observed in the nodule, we will order a repeat CT scan of your chest in mid-November 2025 to monitor any further changes. If the CT scan shows any changes, we may consider a PET scan or bronchoscopy to assess the nodule.

## 2024-04-28 ENCOUNTER — Encounter: Payer: Self-pay | Admitting: Emergency Medicine

## 2024-05-06 ENCOUNTER — Ambulatory Visit
Admission: RE | Admit: 2024-05-06 | Discharge: 2024-05-06 | Disposition: A | Discharge status: Home / Self Care | Source: Ambulatory Visit | Attending: Emergency Medicine | Admitting: Emergency Medicine

## 2024-05-06 ENCOUNTER — Ambulatory Visit: Admitting: Podiatry

## 2024-05-06 ENCOUNTER — Telehealth: Payer: Self-pay

## 2024-05-06 DIAGNOSIS — R911 Solitary pulmonary nodule: Secondary | ICD-10-CM

## 2024-05-06 NOTE — Telephone Encounter (Signed)
 Spoke with pt's daughter and advised of below recommendations. She verbalized understanding.

## 2024-05-06 NOTE — Telephone Encounter (Signed)
 Copied from CRM [id number]. Topic: Clinical - Medication Question >> May 06, 2024 10:45 AM Revonda D wrote: Reason for CRM: Pt's daughter Gerard stated that the pt is experiencing coughing and losing her voice. Meghan wants to know what over the counter medication the pt can take and would like a callback with an update.

## 2024-05-11 DIAGNOSIS — I10 Essential (primary) hypertension: Secondary | ICD-10-CM | POA: Diagnosis not present

## 2024-05-11 DIAGNOSIS — I420 Dilated cardiomyopathy: Secondary | ICD-10-CM | POA: Diagnosis not present

## 2024-05-11 DIAGNOSIS — I447 Left bundle-branch block, unspecified: Secondary | ICD-10-CM | POA: Diagnosis not present

## 2024-05-14 DIAGNOSIS — Z6828 Body mass index (BMI) 28.0-28.9, adult: Secondary | ICD-10-CM | POA: Diagnosis not present

## 2024-05-14 DIAGNOSIS — S32020A Wedge compression fracture of second lumbar vertebra, initial encounter for closed fracture: Secondary | ICD-10-CM | POA: Diagnosis not present

## 2024-05-20 ENCOUNTER — Encounter: Payer: Self-pay | Admitting: Emergency Medicine

## 2024-05-20 ENCOUNTER — Ambulatory Visit: Admitting: Emergency Medicine

## 2024-05-20 VITALS — BP 100/58 | HR 75 | Temp 98.1°F | Ht 61.0 in | Wt 152.4 lb

## 2024-05-20 DIAGNOSIS — R918 Other nonspecific abnormal finding of lung field: Secondary | ICD-10-CM

## 2024-05-20 DIAGNOSIS — J449 Chronic obstructive pulmonary disease, unspecified: Secondary | ICD-10-CM

## 2024-05-20 NOTE — Patient Instructions (Addendum)
 We reviewed your CT scan of the chest today.  There has been some continued slow enlargement of an area of nodularity in the right lower lobe.  This is concerning for possible primary lung cancer. We discussed possibly performing a bronchoscopy to get biopsies of the right lower lobe nodule.  This would be done under general anesthesia. Get your echocardiogram and follow with cardiology as planned.  Discussed with them the possible procedure and the need for general anesthesia.  You will need to be deemed safe for a procedure by cardiology before we can schedule. Follow Dr. Shelah in 1 month so we can talk about next steps in your evaluation.

## 2024-05-20 NOTE — Progress Notes (Signed)
   Subjective:    Patient ID: Beth Shepard, female    DOB: 08-Sep-1955, 68 y.o.   MRN: [id number]  HPI  History of Present Illness   ROV 05/20/2024 --Orie is 20 with a history of former tobacco use.  I saw her for an abnormal CT scan of the chest that showed a spiculated airspace opacity in the right lower lobe with some surrounding micronodular disease, possibly increasing in size on serial imaging.  Repeated CT chest 05/06/2024 as below.  She has been dealing with back pain from an L2 compression fracture after a fall. She follows with cards at [facility] - is due for a TTE next week, possible pacer in the plans. Woiuld need cards clearance.   CT chest done 05/06/2024 reviewed by me, shows multiple small stable pulmonary nodules with an irregular nodular mass in the medial right lower lobe that appears to have more focal solid component in the posterior aspect, measures slightly larger compared with her prior exams including from August 2025    Review of Systems As per HPI     Objective:    Vitals:   05/20/24 1023  BP: (!) 100/58  Pulse: 75  Temp: 98.1 F (36.7 C)  SpO2: 99%  Weight: 152 lb 6.4 oz (69.1 kg)  Height: 5' 1 (1.549 m)   Physical Exam Gen: Pleasant, well-nourished, in no distress, somewhat stoic affect  ENT: No lesions,  mouth clear,  oropharynx clear, no postnasal drip  Neck: No JVD, no stridor  Lungs: No use of accessory muscles, no crackles or wheezing on normal respiration, no wheeze on forced expiration  Cardiovascular: RRR, heart sounds normal, no murmur or gallops, no peripheral edema  Musculoskeletal: No deformities, she is wearing a back brace  Neuro: alert, awake, non focal  Skin: Warm, no lesions or rash       Assessment & Plan:   Assessment & Plan Chronic obstructive pulmonary disease, unspecified COPD type (HCC) She is not currently on therapy.  She denies any dyspnea or respiratory symptoms currently. Pulmonary  nodules Continues to enlarge on serial imaging.  At least moderate suspicion for primary malignancy.  She does also have RA.  She has a dilated cardiomyopathy and will need cardiac evaluation and clearance.  She has an echocardiogram planned for next week and also cardiology follow-up.  There is some discussion of possible pacer.  Ultimately we will need cardiac approval before we could proceed with bronchoscopy.  I have recommended bronchoscopy and she is going to talk to her daughters about this.  I will see her in 1 month so we can decide whether to proceed.  Assessment & Plan    Return in about 1 month (around 06/19/2024) for With Dr. Shelah.   I personally spent a total of 34 minutes in the care of the patient today including preparing to see the patient, getting/reviewing separately obtained history, performing a medically appropriate exam/evaluation, counseling and educating, documenting clinical information in the EHR, independently interpreting results, and communicating results.   Lamar Shelah, MD, PhD 05/20/2024, 10:54 AM [facility] [phone] or if no answer before 7:00PM call [phone] For any issues after 7:00PM please call eLink [phone]

## 2024-05-20 NOTE — Assessment & Plan Note (Addendum)
 Continues to enlarge on serial imaging.  At least moderate suspicion for primary malignancy.  She does also have RA.  She has a dilated cardiomyopathy and will need cardiac evaluation and clearance.  She has an echocardiogram planned for next week and also cardiology follow-up.  There is some discussion of possible pacer.  Ultimately we will need cardiac approval before we could proceed with bronchoscopy.  I have recommended bronchoscopy and she is going to talk to her daughters about this.  I will see her in 1 month so we can decide whether to proceed.

## 2024-05-20 NOTE — Assessment & Plan Note (Addendum)
 She is not currently on therapy.  She denies any dyspnea or respiratory symptoms currently.

## 2024-05-25 DIAGNOSIS — I447 Left bundle-branch block, unspecified: Secondary | ICD-10-CM | POA: Diagnosis not present

## 2024-05-25 DIAGNOSIS — I081 Rheumatic disorders of both mitral and tricuspid valves: Secondary | ICD-10-CM | POA: Diagnosis not present

## 2024-05-25 DIAGNOSIS — I420 Dilated cardiomyopathy: Secondary | ICD-10-CM | POA: Diagnosis not present

## 2024-05-28 ENCOUNTER — Encounter: Payer: Self-pay | Admitting: Emergency Medicine

## 2024-06-05 NOTE — Telephone Encounter (Signed)
 Copied from CRM #[id number]. Topic: General - Other >> Jun 05, 2024  8:44 AM Joesph PARAS wrote: Reason for CRM: [name] is calling to request that Dr. Shelah or his nurse address the MyChart message sent 12/04 regarding patient's need for a surgical clearance before proceeding.  LOV bronch was talked about. Cardiology would need to deem pt safe for procedure before scheduling. Office fax number is [phone].  I am routing to our surgical clearance pool so this can be sent to Cardiology to approve.

## 2024-06-10 ENCOUNTER — Ambulatory Visit: Admitting: Emergency Medicine

## 2024-06-11 ENCOUNTER — Encounter: Payer: Self-pay | Admitting: *Deleted

## 2024-06-11 NOTE — Telephone Encounter (Signed)
 Copy of Dr. Lanny recent ov as well as she letter asking Dr Launie for risk assessment was faxed to Dr Launie. Will wait to hear back.

## 2024-06-12 ENCOUNTER — Other Ambulatory Visit: Payer: Self-pay | Admitting: Internal Medicine

## 2024-06-12 DIAGNOSIS — K2970 Gastritis, unspecified, without bleeding: Secondary | ICD-10-CM

## 2024-06-22 NOTE — Telephone Encounter (Signed)
 Received signed letter back from cards- placed in Dr. Lanny lookat in B pod   Pt optimized for surgery from cardiac standpoint only  Ok to hold ASA 7 days prior to surgery

## 2024-06-29 ENCOUNTER — Ambulatory Visit: Admitting: Podiatry

## 2024-06-29 DIAGNOSIS — E1151 Type 2 diabetes mellitus with diabetic peripheral angiopathy without gangrene: Secondary | ICD-10-CM | POA: Diagnosis not present

## 2024-06-29 DIAGNOSIS — R234 Changes in skin texture: Secondary | ICD-10-CM | POA: Diagnosis not present

## 2024-06-29 DIAGNOSIS — L84 Corns and callosities: Secondary | ICD-10-CM

## 2024-06-29 DIAGNOSIS — M79675 Pain in left toe(s): Secondary | ICD-10-CM | POA: Diagnosis not present

## 2024-06-29 DIAGNOSIS — B351 Tinea unguium: Secondary | ICD-10-CM

## 2024-06-29 DIAGNOSIS — M79674 Pain in right toe(s): Secondary | ICD-10-CM | POA: Diagnosis not present

## 2024-06-29 MED ORDER — MUPIROCIN 2 % EX OINT: TOPICAL_OINTMENT | CUTANEOUS | 2 refills | Status: AC

## 2024-06-29 NOTE — Progress Notes (Signed)
 "     Subjective:  Patient ID: Beth Shepard, female    DOB: 10/16/1955,  MRN: [id number]  Beth Shepard presents to clinic today for:  Chief Complaint  Patient presents with   [facility]    [facility] with callous and a spot on the left heel, feels callused, but is sore to touch. Can not tell if there is an ulcer.  A1c was April it was 6.0 ASA   Patient notes nails are thick and elongated, causing pain in shoe gear when ambulating.  She gets a painful corn on the left toe and lateral left foot.  She does acknowledge she sleeps on her left side.  She has a painful area on the back of the left heel.  Denies injury to the area.  She is not sure if this is a callus or some type of other sore.  There is a female companion with her today.  PCP is Antonio Meth, Hollister R, DO.  Last seen around 03/27/2024  Past Medical History:  Diagnosis Date   Anxiety    Bilateral edema of lower extremity    Swelling in legs and ankles   Congestive heart failure (HCC)    COPD (chronic obstructive pulmonary disease) (HCC)    Diabetes mellitus    type 2   Gastric polyps    GERD (gastroesophageal reflux disease)    chronic   Hyperlipidemia    Hypertension    IBS (irritable bowel syndrome)    LBBB (left bundle branch block)    Dr. Shermon Counter, [facility]   Pneumonia    hx of   PONV (postoperative nausea and vomiting)    Had N/V after having tubal ligation and D&C   Rheumatoid arthritis (HCC)    Shingles    Allergies[1]  Objective:  Beth Shepard is a pleasant 69 y.o. female in NAD. AAO x 3.  Vascular Examination: Patient has palpable DP pulse, absent PT pulse bilateral.  Delayed capillary refill bilateral toes.  Sparse digital hair bilateral.  Proximal to distal cooling WNL bilateral.    Dermatological Examination: Interspaces are clear with no open lesions noted bilateral.  Skin is shiny and atrophic bilateral.  Nails are 3-19mm thick, with yellowish/brown discoloration, subungual debris and  distal onycholysis x10.  There is pain with compression of nails x10.  There are hyperkeratotic lesions noted on the dorsal lateral aspect of the left fifth toe PIPJ.  There is another hyperkeratotic lesion on the lateral aspect of the fifth metatarsal head.  There is a hyperkeratotic lesion with central partial-thickness fissure oriented horizontally, on the posterior aspect of the left heel.  This pain on palpation of the area.  This is dry and stable with no clinical signs of infection but there is pain on palpation..     Latest Ref Rng & Units 10/14/2023    1:54 PM  Hemoglobin A1C  Hemoglobin-A1c 4.6 - 6.5 % 6.0    Patient qualifies for at-risk foot care because of type 2 diabetes with PVD.  Assessment/Plan: 1. Pain due to onychomycosis of toenails of both feet   2. Callus of foot   3. Type II diabetes mellitus with peripheral circulatory disorder (HCC)   4. Fissure in skin of left foot     Meds ordered this encounter  Medications   mupirocin  ointment (BACTROBAN ) 2 %    Sig: Apply to fissure on back of left heel and cover with light gauze dressing.  Change once daily    Dispense:  22  g    Refill:  2   Mycotic nails x10 were sharply debrided with sterile nail nippers and power debriding burr to decrease bulk and length.  Hyperkeratotic lesions x 3 were shaved with #312 blade.  All locations are proximal to the toe DIPJ's.  Specific locations are listed above in the dermatological examination.  The posterior heel fissure was shaved of any hyperkeratotic tissue.  Antibiotic ointment and a gauze pad was applied to the area.  Prescribe mupirocin  ointment for them to massage into the area daily and follow-up with the gauze dressing.  Try to avoid pressure to the posterior heel.  Discussed use of pillows underneath the calf to offload the posterior heel.  Return in about 4 weeks (around 07/27/2024) for f/u posterior L heel fissure.   Beth Shepard, DPM, FACFAS [facility]     [address], [place] [address]                Office [phone]  Fax [phone]    [1]  Allergies Allergen Reactions   Prednisone  Palpitations and Other (See Comments)    Dose pack causes vomiting; Causes Gerd N/v/d N/v/d    Codeine Nausea Only and Palpitations    REACTION: palpitation  REACTION: palpitation     Ibuprofen Other (See Comments) and Rash    REACTION: questionable. Pt states that she can take this sometimes. GERD   "

## 2024-06-30 ENCOUNTER — Telehealth: Payer: Self-pay | Admitting: *Deleted

## 2024-06-30 NOTE — Telephone Encounter (Signed)
 Copied from CRM [id number]. Topic: Clinical - Request for Lab/Test Order >> Jun 29, 2024  4:34 PM Kevelyn M wrote: Reason for CRM: Arvin with [facility] (nurse case manager) calling in regards to a fax in regards to a bone fracture 03/16/2024 and wanted to see if she needed a bone density screening. Fax sent on 12/17, and 12/29. Leann also called on 12/17 and 12/29.  Call back # [phone]

## 2024-07-06 ENCOUNTER — Encounter: Payer: Self-pay | Admitting: Emergency Medicine

## 2024-07-06 NOTE — Telephone Encounter (Signed)
 Called [facility], lvm letting them know I haven't received anything for her.

## 2024-07-07 ENCOUNTER — Telehealth: Payer: Self-pay

## 2024-07-07 NOTE — Telephone Encounter (Signed)
 F/u  [name] Duwaine is calling to schedule biopsy.  Please call [phone].

## 2024-07-08 NOTE — Telephone Encounter (Signed)
 Need to see her in office, confirm that she is stable from cardiology standpoint.  Let her know that we should be able to set her up for procedure in early February.

## 2024-07-10 NOTE — Telephone Encounter (Signed)
 Patient has been scheduled. NFN

## 2024-07-15 ENCOUNTER — Telehealth: Payer: Self-pay

## 2024-07-15 ENCOUNTER — Other Ambulatory Visit: Payer: Self-pay | Admitting: Family Medicine

## 2024-07-15 DIAGNOSIS — E2839 Other primary ovarian failure: Secondary | ICD-10-CM

## 2024-07-15 NOTE — Telephone Encounter (Signed)
 Copied from CRM #[id number]. Topic: Clinical - Request for Lab/Test Order >> Jul 15, 2024  9:50 AM Chasity T wrote: Reason for CRM: Leanne a nurse from [facility] is calling requesting that a bone density test be ordered for patient due to a recent bone fracture. Please call for further questions if needed, Phone number: [phone]

## 2024-07-23 ENCOUNTER — Ambulatory Visit: Admitting: Emergency Medicine

## 2024-07-27 ENCOUNTER — Ambulatory Visit: Admitting: Podiatry

## 2024-07-29 ENCOUNTER — Telehealth: Payer: Self-pay

## 2024-07-29 NOTE — Telephone Encounter (Signed)
 Copied from CRM #[id number]. Topic: Clinical - Request for Lab/Test Order >> Jul 15, 2024  9:50 AM Chasity T wrote: Reason for CRM: Leanne a nurse from [facility] is calling requesting that a bone density test be ordered for patient due to a recent bone fracture. Please call for further questions if needed, Phone number: [phone] >> Jul 28, 2024  4:25 PM Hadassah PARAS wrote: Arvin calling to f/u on order for bone density. There is a note where it sates ordered. She would like to know where the referral was sent to. Please call LeAnn on #[phone]

## 2024-07-29 NOTE — Telephone Encounter (Signed)
 Returned call. LVM advising of where orders were placed

## 2024-08-05 ENCOUNTER — Ambulatory Visit: Admitting: Podiatry

## 2024-08-27 ENCOUNTER — Ambulatory Visit: Admitting: Emergency Medicine

## 2024-09-30 ENCOUNTER — Ambulatory Visit: Admitting: Podiatry

## 2024-12-09 ENCOUNTER — Ambulatory Visit
